# Patient Record
Sex: Female | Born: 1960 | Race: White | Hispanic: No | Marital: Married | Smoking: Current every day smoker
Health system: Southern US, Community
[De-identification: ages and names within clinical notes are randomized; demographics above are authoritative.]

## PROBLEM LIST (undated history)

## (undated) DIAGNOSIS — E119 Type 2 diabetes mellitus without complications: Secondary | ICD-10-CM

## (undated) DIAGNOSIS — J189 Pneumonia, unspecified organism: Secondary | ICD-10-CM

## (undated) DIAGNOSIS — IMO0001 Reserved for inherently not codable concepts without codable children: Secondary | ICD-10-CM

## (undated) DIAGNOSIS — I50810 Right heart failure, unspecified: Secondary | ICD-10-CM

## (undated) DIAGNOSIS — J449 Chronic obstructive pulmonary disease, unspecified: Secondary | ICD-10-CM

## (undated) DIAGNOSIS — R569 Unspecified convulsions: Secondary | ICD-10-CM

## (undated) DIAGNOSIS — R0602 Shortness of breath: Secondary | ICD-10-CM

## (undated) DIAGNOSIS — Z5189 Encounter for other specified aftercare: Secondary | ICD-10-CM

## (undated) DIAGNOSIS — J439 Emphysema, unspecified: Secondary | ICD-10-CM

## (undated) DIAGNOSIS — D649 Anemia, unspecified: Secondary | ICD-10-CM

## (undated) DIAGNOSIS — G8220 Paraplegia, unspecified: Secondary | ICD-10-CM

## (undated) DIAGNOSIS — M797 Fibromyalgia: Secondary | ICD-10-CM

## (undated) DIAGNOSIS — F32A Depression, unspecified: Secondary | ICD-10-CM

## (undated) DIAGNOSIS — G4730 Sleep apnea, unspecified: Secondary | ICD-10-CM

## (undated) DIAGNOSIS — E7800 Pure hypercholesterolemia, unspecified: Secondary | ICD-10-CM

## (undated) DIAGNOSIS — E079 Disorder of thyroid, unspecified: Secondary | ICD-10-CM

## (undated) DIAGNOSIS — F329 Major depressive disorder, single episode, unspecified: Secondary | ICD-10-CM

## (undated) DIAGNOSIS — Z9981 Dependence on supplemental oxygen: Secondary | ICD-10-CM

## (undated) DIAGNOSIS — I509 Heart failure, unspecified: Secondary | ICD-10-CM

## (undated) DIAGNOSIS — F410 Panic disorder [episodic paroxysmal anxiety] without agoraphobia: Secondary | ICD-10-CM

## (undated) DIAGNOSIS — M1990 Unspecified osteoarthritis, unspecified site: Secondary | ICD-10-CM

## (undated) DIAGNOSIS — F419 Anxiety disorder, unspecified: Secondary | ICD-10-CM

## (undated) DIAGNOSIS — I10 Essential (primary) hypertension: Secondary | ICD-10-CM

## (undated) DIAGNOSIS — E039 Hypothyroidism, unspecified: Secondary | ICD-10-CM

## (undated) HISTORY — DX: Reserved for inherently not codable concepts without codable children: IMO0001

## (undated) HISTORY — DX: Anxiety disorder, unspecified: F41.9

## (undated) HISTORY — DX: Pure hypercholesterolemia, unspecified: E78.00

## (undated) HISTORY — PX: CHOLECYSTECTOMY: SHX55

## (undated) HISTORY — PX: ABDOMINAL HYSTERECTOMY: SHX81

## (undated) HISTORY — DX: Disorder of thyroid, unspecified: E07.9

## (undated) HISTORY — DX: Panic disorder (episodic paroxysmal anxiety): F41.0

## (undated) HISTORY — PX: OTHER SURGICAL HISTORY: SHX169

---

## 1998-05-22 ENCOUNTER — Other Ambulatory Visit: Admission: RE | Admit: 1998-05-22 | Discharge: 1998-05-22 | Payer: Self-pay | Admitting: Obstetrics and Gynecology

## 1998-07-31 ENCOUNTER — Ambulatory Visit (HOSPITAL_COMMUNITY): Admission: RE | Admit: 1998-07-31 | Discharge: 1998-07-31 | Payer: Self-pay | Admitting: Obstetrics and Gynecology

## 1998-12-20 ENCOUNTER — Ambulatory Visit (HOSPITAL_COMMUNITY): Admission: RE | Admit: 1998-12-20 | Discharge: 1998-12-20 | Payer: Self-pay | Admitting: *Deleted

## 1999-06-22 ENCOUNTER — Emergency Department (HOSPITAL_COMMUNITY): Admission: EM | Admit: 1999-06-22 | Discharge: 1999-06-22 | Payer: Self-pay | Admitting: Emergency Medicine

## 1999-06-22 ENCOUNTER — Encounter: Payer: Self-pay | Admitting: Emergency Medicine

## 1999-07-11 ENCOUNTER — Emergency Department (HOSPITAL_COMMUNITY): Admission: EM | Admit: 1999-07-11 | Discharge: 1999-07-11 | Payer: Self-pay

## 1999-08-20 ENCOUNTER — Emergency Department (HOSPITAL_COMMUNITY): Admission: EM | Admit: 1999-08-20 | Discharge: 1999-08-20 | Payer: Self-pay | Admitting: Emergency Medicine

## 1999-08-20 ENCOUNTER — Encounter: Payer: Self-pay | Admitting: Emergency Medicine

## 1999-08-22 ENCOUNTER — Encounter: Payer: Self-pay | Admitting: Family Medicine

## 1999-08-22 ENCOUNTER — Inpatient Hospital Stay (HOSPITAL_COMMUNITY): Admission: RE | Admit: 1999-08-22 | Discharge: 1999-08-24 | Payer: Self-pay | Admitting: Family Medicine

## 2000-01-08 ENCOUNTER — Other Ambulatory Visit: Admission: RE | Admit: 2000-01-08 | Discharge: 2000-01-08 | Payer: Self-pay | Admitting: Gynecology

## 2000-02-18 ENCOUNTER — Encounter: Admission: RE | Admit: 2000-02-18 | Discharge: 2000-05-18 | Payer: Self-pay | Admitting: Gynecology

## 2000-03-14 ENCOUNTER — Emergency Department (HOSPITAL_COMMUNITY): Admission: EM | Admit: 2000-03-14 | Discharge: 2000-03-14 | Payer: Self-pay | Admitting: Emergency Medicine

## 2000-03-14 ENCOUNTER — Encounter: Payer: Self-pay | Admitting: Emergency Medicine

## 2000-03-31 ENCOUNTER — Other Ambulatory Visit: Admission: RE | Admit: 2000-03-31 | Discharge: 2000-03-31 | Payer: Self-pay

## 2000-03-31 ENCOUNTER — Encounter: Admission: RE | Admit: 2000-03-31 | Discharge: 2000-03-31 | Payer: Self-pay | Admitting: General Surgery

## 2000-03-31 ENCOUNTER — Encounter: Payer: Self-pay | Admitting: General Surgery

## 2000-12-28 ENCOUNTER — Encounter: Payer: Self-pay | Admitting: *Deleted

## 2000-12-28 ENCOUNTER — Encounter: Admission: RE | Admit: 2000-12-28 | Discharge: 2000-12-28 | Payer: Self-pay | Admitting: *Deleted

## 2001-07-17 ENCOUNTER — Emergency Department (HOSPITAL_COMMUNITY): Admission: EM | Admit: 2001-07-17 | Discharge: 2001-07-17 | Payer: Self-pay | Admitting: Emergency Medicine

## 2001-07-17 ENCOUNTER — Encounter: Payer: Self-pay | Admitting: *Deleted

## 2002-02-14 ENCOUNTER — Emergency Department (HOSPITAL_COMMUNITY): Admission: EM | Admit: 2002-02-14 | Discharge: 2002-02-14 | Payer: Self-pay | Admitting: Emergency Medicine

## 2002-02-14 ENCOUNTER — Encounter: Payer: Self-pay | Admitting: Emergency Medicine

## 2002-06-27 ENCOUNTER — Ambulatory Visit (HOSPITAL_COMMUNITY): Admission: RE | Admit: 2002-06-27 | Discharge: 2002-06-27 | Payer: Self-pay | Admitting: Family Medicine

## 2002-06-27 ENCOUNTER — Encounter: Payer: Self-pay | Admitting: Family Medicine

## 2002-12-16 ENCOUNTER — Encounter: Payer: Self-pay | Admitting: Family Medicine

## 2002-12-16 ENCOUNTER — Ambulatory Visit (HOSPITAL_COMMUNITY): Admission: RE | Admit: 2002-12-16 | Discharge: 2002-12-16 | Payer: Self-pay | Admitting: Family Medicine

## 2003-02-13 ENCOUNTER — Ambulatory Visit (HOSPITAL_BASED_OUTPATIENT_CLINIC_OR_DEPARTMENT_OTHER): Admission: RE | Admit: 2003-02-13 | Discharge: 2003-02-13 | Payer: Self-pay | Admitting: Family Medicine

## 2003-03-13 ENCOUNTER — Encounter: Payer: Self-pay | Admitting: Family Medicine

## 2003-03-13 ENCOUNTER — Ambulatory Visit (HOSPITAL_COMMUNITY): Admission: RE | Admit: 2003-03-13 | Discharge: 2003-03-13 | Payer: Self-pay | Admitting: Family Medicine

## 2003-04-07 ENCOUNTER — Ambulatory Visit (HOSPITAL_COMMUNITY): Admission: RE | Admit: 2003-04-07 | Discharge: 2003-04-07 | Payer: Self-pay | Admitting: Family Medicine

## 2003-04-07 ENCOUNTER — Encounter: Payer: Self-pay | Admitting: Family Medicine

## 2003-04-18 ENCOUNTER — Encounter: Payer: Self-pay | Admitting: Family Medicine

## 2003-04-18 ENCOUNTER — Encounter: Admission: RE | Admit: 2003-04-18 | Discharge: 2003-04-18 | Payer: Self-pay | Admitting: Family Medicine

## 2003-04-21 ENCOUNTER — Ambulatory Visit (HOSPITAL_COMMUNITY): Admission: RE | Admit: 2003-04-21 | Discharge: 2003-04-21 | Payer: Self-pay | Admitting: Cardiology

## 2003-04-25 ENCOUNTER — Encounter: Admission: RE | Admit: 2003-04-25 | Discharge: 2003-04-25 | Payer: Self-pay | Admitting: Family Medicine

## 2003-04-25 ENCOUNTER — Encounter: Payer: Self-pay | Admitting: Family Medicine

## 2003-08-29 ENCOUNTER — Emergency Department (HOSPITAL_COMMUNITY): Admission: EM | Admit: 2003-08-29 | Discharge: 2003-08-30 | Payer: Self-pay | Admitting: Emergency Medicine

## 2004-11-29 ENCOUNTER — Emergency Department (HOSPITAL_COMMUNITY): Admission: EM | Admit: 2004-11-29 | Discharge: 2004-11-30 | Payer: Self-pay | Admitting: Emergency Medicine

## 2005-02-28 ENCOUNTER — Ambulatory Visit: Payer: Self-pay | Admitting: Internal Medicine

## 2005-02-28 ENCOUNTER — Ambulatory Visit (HOSPITAL_COMMUNITY): Admission: RE | Admit: 2005-02-28 | Discharge: 2005-02-28 | Payer: Self-pay | Admitting: Internal Medicine

## 2005-03-06 ENCOUNTER — Ambulatory Visit (HOSPITAL_COMMUNITY): Admission: RE | Admit: 2005-03-06 | Discharge: 2005-03-06 | Payer: Self-pay | Admitting: *Deleted

## 2005-03-14 ENCOUNTER — Encounter (INDEPENDENT_AMBULATORY_CARE_PROVIDER_SITE_OTHER): Payer: Self-pay | Admitting: *Deleted

## 2005-03-14 ENCOUNTER — Ambulatory Visit: Payer: Self-pay | Admitting: Internal Medicine

## 2005-03-17 ENCOUNTER — Ambulatory Visit (HOSPITAL_COMMUNITY): Admission: RE | Admit: 2005-03-17 | Discharge: 2005-03-17 | Payer: Self-pay | Admitting: Internal Medicine

## 2005-03-21 ENCOUNTER — Ambulatory Visit: Payer: Self-pay | Admitting: Internal Medicine

## 2005-03-28 ENCOUNTER — Ambulatory Visit (HOSPITAL_COMMUNITY): Admission: RE | Admit: 2005-03-28 | Discharge: 2005-03-28 | Payer: Self-pay | Admitting: Internal Medicine

## 2005-03-28 ENCOUNTER — Ambulatory Visit: Payer: Self-pay | Admitting: Internal Medicine

## 2005-04-10 ENCOUNTER — Ambulatory Visit: Payer: Self-pay | Admitting: Internal Medicine

## 2005-04-23 ENCOUNTER — Encounter: Admission: RE | Admit: 2005-04-23 | Discharge: 2005-05-10 | Payer: Self-pay | Admitting: *Deleted

## 2005-04-25 ENCOUNTER — Ambulatory Visit: Payer: Self-pay | Admitting: Sports Medicine

## 2005-04-25 ENCOUNTER — Ambulatory Visit (HOSPITAL_COMMUNITY): Admission: RE | Admit: 2005-04-25 | Discharge: 2005-04-25 | Payer: Self-pay | Admitting: Family Medicine

## 2005-06-13 ENCOUNTER — Ambulatory Visit: Payer: Self-pay | Admitting: Sports Medicine

## 2005-06-20 ENCOUNTER — Ambulatory Visit: Payer: Self-pay | Admitting: Hospitalist

## 2005-06-30 ENCOUNTER — Encounter: Admission: RE | Admit: 2005-06-30 | Discharge: 2005-07-30 | Payer: Self-pay | Admitting: Sports Medicine

## 2005-07-01 ENCOUNTER — Ambulatory Visit: Payer: Self-pay | Admitting: Internal Medicine

## 2005-07-10 ENCOUNTER — Ambulatory Visit (HOSPITAL_COMMUNITY): Admission: RE | Admit: 2005-07-10 | Discharge: 2005-07-10 | Payer: Self-pay | Admitting: Internal Medicine

## 2005-07-21 ENCOUNTER — Encounter: Admission: RE | Admit: 2005-07-21 | Discharge: 2005-07-21 | Payer: Self-pay | Admitting: Internal Medicine

## 2005-07-21 ENCOUNTER — Encounter (INDEPENDENT_AMBULATORY_CARE_PROVIDER_SITE_OTHER): Payer: Self-pay | Admitting: *Deleted

## 2005-09-09 ENCOUNTER — Ambulatory Visit: Payer: Self-pay | Admitting: Hospitalist

## 2006-06-10 DIAGNOSIS — G4730 Sleep apnea, unspecified: Secondary | ICD-10-CM | POA: Insufficient documentation

## 2006-06-10 DIAGNOSIS — K219 Gastro-esophageal reflux disease without esophagitis: Secondary | ICD-10-CM

## 2006-06-10 DIAGNOSIS — R945 Abnormal results of liver function studies: Secondary | ICD-10-CM | POA: Insufficient documentation

## 2006-06-10 DIAGNOSIS — R109 Unspecified abdominal pain: Secondary | ICD-10-CM | POA: Insufficient documentation

## 2006-06-10 DIAGNOSIS — J984 Other disorders of lung: Secondary | ICD-10-CM

## 2006-06-10 DIAGNOSIS — J4489 Other specified chronic obstructive pulmonary disease: Secondary | ICD-10-CM | POA: Insufficient documentation

## 2006-06-10 DIAGNOSIS — Z9079 Acquired absence of other genital organ(s): Secondary | ICD-10-CM | POA: Insufficient documentation

## 2006-06-10 DIAGNOSIS — M545 Low back pain: Secondary | ICD-10-CM

## 2006-06-10 DIAGNOSIS — IMO0001 Reserved for inherently not codable concepts without codable children: Secondary | ICD-10-CM

## 2006-06-10 DIAGNOSIS — J449 Chronic obstructive pulmonary disease, unspecified: Secondary | ICD-10-CM

## 2006-06-10 DIAGNOSIS — Z8541 Personal history of malignant neoplasm of cervix uteri: Secondary | ICD-10-CM

## 2006-06-10 DIAGNOSIS — F17200 Nicotine dependence, unspecified, uncomplicated: Secondary | ICD-10-CM | POA: Insufficient documentation

## 2006-06-10 DIAGNOSIS — F411 Generalized anxiety disorder: Secondary | ICD-10-CM | POA: Insufficient documentation

## 2006-09-16 DIAGNOSIS — E781 Pure hyperglyceridemia: Secondary | ICD-10-CM | POA: Insufficient documentation

## 2006-12-04 ENCOUNTER — Emergency Department (HOSPITAL_COMMUNITY): Admission: EM | Admit: 2006-12-04 | Discharge: 2006-12-04 | Payer: Self-pay | Admitting: Family Medicine

## 2006-12-30 ENCOUNTER — Emergency Department (HOSPITAL_COMMUNITY): Admission: EM | Admit: 2006-12-30 | Discharge: 2006-12-30 | Payer: Self-pay | Admitting: Emergency Medicine

## 2009-06-22 ENCOUNTER — Ambulatory Visit: Payer: Self-pay | Admitting: Infectious Diseases

## 2009-06-22 ENCOUNTER — Inpatient Hospital Stay (HOSPITAL_COMMUNITY): Admission: EM | Admit: 2009-06-22 | Discharge: 2009-06-24 | Payer: Self-pay | Admitting: Emergency Medicine

## 2010-08-20 ENCOUNTER — Inpatient Hospital Stay (HOSPITAL_COMMUNITY)
Admission: EM | Admit: 2010-08-20 | Discharge: 2010-09-07 | Payer: Self-pay | Source: Home / Self Care | Attending: Internal Medicine | Admitting: Internal Medicine

## 2010-08-21 ENCOUNTER — Encounter: Payer: Self-pay | Admitting: Pulmonary Disease

## 2010-08-28 ENCOUNTER — Other Ambulatory Visit: Payer: Self-pay | Admitting: Internal Medicine

## 2010-08-28 DIAGNOSIS — F329 Major depressive disorder, single episode, unspecified: Secondary | ICD-10-CM

## 2010-08-28 LAB — CBC
HCT: 31.7 % — ABNORMAL LOW (ref 36.0–46.0)
Hemoglobin: 10.6 g/dL — ABNORMAL LOW (ref 12.0–15.0)
MCH: 31.6 pg (ref 26.0–34.0)
MCHC: 33.4 g/dL (ref 30.0–36.0)
MCV: 94.6 fL (ref 78.0–100.0)
Platelets: 130 10*3/uL — ABNORMAL LOW (ref 150–400)
RBC: 3.35 MIL/uL — ABNORMAL LOW (ref 3.87–5.11)
RDW: 15.1 % (ref 11.5–15.5)
WBC: 11.1 10*3/uL — ABNORMAL HIGH (ref 4.0–10.5)

## 2010-08-28 LAB — RENAL FUNCTION PANEL
Albumin: 2.7 g/dL — ABNORMAL LOW (ref 3.5–5.2)
BUN: 65 mg/dL — ABNORMAL HIGH (ref 6–23)
CO2: 22 mEq/L (ref 19–32)
Calcium: 9.4 mg/dL (ref 8.4–10.5)
Chloride: 98 mEq/L (ref 96–112)
Creatinine, Ser: 7 mg/dL — ABNORMAL HIGH (ref 0.4–1.2)
GFR calc Af Amer: 8 mL/min — ABNORMAL LOW (ref >60)
GFR calc non Af Amer: 6 mL/min — ABNORMAL LOW (ref >60)
Glucose, Bld: 82 mg/dL (ref 70–99)
Phosphorus: 6.8 mg/dL — ABNORMAL HIGH (ref 2.3–4.6)
Potassium: 3.5 mEq/L (ref 3.5–5.1)
Sodium: 144 mEq/L (ref 135–145)

## 2010-08-28 LAB — GLUCOSE, CAPILLARY
Glucose-Capillary: 83 mg/dL (ref 70–99)
Glucose-Capillary: 88 mg/dL (ref 70–99)
Glucose-Capillary: 79 mg/dL (ref 70–99)
Glucose-Capillary: 86 mg/dL (ref 70–99)

## 2010-08-28 LAB — COMPREHENSIVE METABOLIC PANEL
ALT: 135 U/L — ABNORMAL HIGH (ref 0–35)
AST: 32 U/L (ref 0–37)
Albumin: 2.5 g/dL — ABNORMAL LOW (ref 3.5–5.2)
Alkaline Phosphatase: 40 U/L (ref 39–117)
BUN: 62 mg/dL — ABNORMAL HIGH (ref 6–23)
CO2: 24 mEq/L (ref 19–32)
Calcium: 9.3 mg/dL (ref 8.4–10.5)
Chloride: 98 mEq/L (ref 96–112)
Creatinine, Ser: 6.91 mg/dL — ABNORMAL HIGH (ref 0.4–1.2)
GFR calc Af Amer: 8 mL/min — ABNORMAL LOW (ref >60)
GFR calc non Af Amer: 6 mL/min — ABNORMAL LOW (ref >60)
Glucose, Bld: 85 mg/dL (ref 70–99)
Potassium: 3.3 mEq/L — ABNORMAL LOW (ref 3.5–5.1)
Sodium: 146 mEq/L — ABNORMAL HIGH (ref 135–145)
Total Bilirubin: 0.9 mg/dL (ref 0.3–1.2)
Total Protein: 5.3 g/dL — ABNORMAL LOW (ref 6.0–8.3)

## 2010-08-28 LAB — MRSA PCR SCREENING: MRSA by PCR: NEGATIVE

## 2010-08-29 DIAGNOSIS — F329 Major depressive disorder, single episode, unspecified: Secondary | ICD-10-CM

## 2010-08-29 LAB — COMPREHENSIVE METABOLIC PANEL
ALT: 102 U/L — ABNORMAL HIGH (ref 0–35)
AST: 30 U/L (ref 0–37)
Albumin: 2.8 g/dL — ABNORMAL LOW (ref 3.5–5.2)
Alkaline Phosphatase: 45 U/L (ref 39–117)
BUN: 68 mg/dL — ABNORMAL HIGH (ref 6–23)
CO2: 25 mEq/L (ref 19–32)
Calcium: 9.6 mg/dL (ref 8.4–10.5)
Chloride: 99 mEq/L (ref 96–112)
Creatinine, Ser: 7.2 mg/dL — ABNORMAL HIGH (ref 0.4–1.2)
GFR calc Af Amer: 7 mL/min — ABNORMAL LOW (ref >60)
GFR calc non Af Amer: 6 mL/min — ABNORMAL LOW (ref >60)
Glucose, Bld: 72 mg/dL (ref 70–99)
Potassium: 3.3 mEq/L — ABNORMAL LOW (ref 3.5–5.1)
Sodium: 147 mEq/L — ABNORMAL HIGH (ref 135–145)
Total Bilirubin: 1.1 mg/dL (ref 0.3–1.2)
Total Protein: 5.7 g/dL — ABNORMAL LOW (ref 6.0–8.3)

## 2010-08-29 LAB — CBC
HCT: 31.5 % — ABNORMAL LOW (ref 36.0–46.0)
Hemoglobin: 10.6 g/dL — ABNORMAL LOW (ref 12.0–15.0)
MCH: 32.2 pg (ref 26.0–34.0)
MCHC: 33.7 g/dL (ref 30.0–36.0)
MCV: 95.7 fL (ref 78.0–100.0)
Platelets: 132 10*3/uL — ABNORMAL LOW (ref 150–400)
RBC: 3.29 MIL/uL — ABNORMAL LOW (ref 3.87–5.11)
RDW: 15.1 % (ref 11.5–15.5)
WBC: 11.3 10*3/uL — ABNORMAL HIGH (ref 4.0–10.5)

## 2010-08-29 LAB — GLUCOSE, CAPILLARY
Glucose-Capillary: 68 mg/dL — ABNORMAL LOW (ref 70–99)
Glucose-Capillary: 116 mg/dL — ABNORMAL HIGH (ref 70–99)
Glucose-Capillary: 81 mg/dL (ref 70–99)

## 2010-08-29 LAB — PHOSPHORUS: Phosphorus: 7.4 mg/dL — ABNORMAL HIGH (ref 2.3–4.6)

## 2010-08-30 DIAGNOSIS — F329 Major depressive disorder, single episode, unspecified: Secondary | ICD-10-CM

## 2010-08-30 LAB — COMPREHENSIVE METABOLIC PANEL
ALT: 82 U/L — ABNORMAL HIGH (ref 0–35)
AST: 34 U/L (ref 0–37)
Albumin: 3.1 g/dL — ABNORMAL LOW (ref 3.5–5.2)
Alkaline Phosphatase: 41 U/L (ref 39–117)
BUN: 70 mg/dL — ABNORMAL HIGH (ref 6–23)
CO2: 22 mEq/L (ref 19–32)
Calcium: 9.6 mg/dL (ref 8.4–10.5)
Chloride: 98 mEq/L (ref 96–112)
Creatinine, Ser: 7.15 mg/dL — ABNORMAL HIGH (ref 0.4–1.2)
GFR calc Af Amer: 7 mL/min — ABNORMAL LOW (ref >60)
GFR calc non Af Amer: 6 mL/min — ABNORMAL LOW (ref >60)
Glucose, Bld: 81 mg/dL (ref 70–99)
Potassium: 3.5 mEq/L (ref 3.5–5.1)
Sodium: 144 mEq/L (ref 135–145)
Total Bilirubin: 1.1 mg/dL (ref 0.3–1.2)
Total Protein: 6.3 g/dL (ref 6.0–8.3)

## 2010-08-30 LAB — BRAIN NATRIURETIC PEPTIDE: Pro B Natriuretic peptide (BNP): 273 pg/mL — ABNORMAL HIGH (ref 0.0–100.0)

## 2010-08-30 LAB — GLUCOSE, CAPILLARY
Glucose-Capillary: 87 mg/dL (ref 70–99)
Glucose-Capillary: 79 mg/dL (ref 70–99)
Glucose-Capillary: 84 mg/dL (ref 70–99)

## 2010-08-30 LAB — CBC
HCT: 31.6 % — ABNORMAL LOW (ref 36.0–46.0)
Hemoglobin: 10.4 g/dL — ABNORMAL LOW (ref 12.0–15.0)
MCH: 31.7 pg (ref 26.0–34.0)
MCHC: 32.9 g/dL (ref 30.0–36.0)
MCV: 96.3 fL (ref 78.0–100.0)
Platelets: 141 10*3/uL — ABNORMAL LOW (ref 150–400)
RBC: 3.28 MIL/uL — ABNORMAL LOW (ref 3.87–5.11)
RDW: 14.8 % (ref 11.5–15.5)
WBC: 10.5 10*3/uL (ref 4.0–10.5)

## 2010-09-09 LAB — GLUCOSE, CAPILLARY
Glucose-Capillary: 79 mg/dL (ref 70–99)
Glucose-Capillary: 145 mg/dL — ABNORMAL HIGH (ref 70–99)
Glucose-Capillary: 153 mg/dL — ABNORMAL HIGH (ref 70–99)
Glucose-Capillary: 181 mg/dL — ABNORMAL HIGH (ref 70–99)
Glucose-Capillary: 139 mg/dL — ABNORMAL HIGH (ref 70–99)
Glucose-Capillary: 147 mg/dL — ABNORMAL HIGH (ref 70–99)
Glucose-Capillary: 170 mg/dL — ABNORMAL HIGH (ref 70–99)
Glucose-Capillary: 172 mg/dL — ABNORMAL HIGH (ref 70–99)
Glucose-Capillary: 111 mg/dL — ABNORMAL HIGH (ref 70–99)
Glucose-Capillary: 112 mg/dL — ABNORMAL HIGH (ref 70–99)
Glucose-Capillary: 125 mg/dL — ABNORMAL HIGH (ref 70–99)
Glucose-Capillary: 133 mg/dL — ABNORMAL HIGH (ref 70–99)
Glucose-Capillary: 133 mg/dL — ABNORMAL HIGH (ref 70–99)
Glucose-Capillary: 133 mg/dL — ABNORMAL HIGH (ref 70–99)
Glucose-Capillary: 143 mg/dL — ABNORMAL HIGH (ref 70–99)
Glucose-Capillary: 146 mg/dL — ABNORMAL HIGH (ref 70–99)
Glucose-Capillary: 147 mg/dL — ABNORMAL HIGH (ref 70–99)
Glucose-Capillary: 150 mg/dL — ABNORMAL HIGH (ref 70–99)
Glucose-Capillary: 155 mg/dL — ABNORMAL HIGH (ref 70–99)
Glucose-Capillary: 160 mg/dL — ABNORMAL HIGH (ref 70–99)
Glucose-Capillary: 160 mg/dL — ABNORMAL HIGH (ref 70–99)
Glucose-Capillary: 104 mg/dL — ABNORMAL HIGH (ref 70–99)
Glucose-Capillary: 106 mg/dL — ABNORMAL HIGH (ref 70–99)
Glucose-Capillary: 111 mg/dL — ABNORMAL HIGH (ref 70–99)
Glucose-Capillary: 112 mg/dL — ABNORMAL HIGH (ref 70–99)
Glucose-Capillary: 114 mg/dL — ABNORMAL HIGH (ref 70–99)
Glucose-Capillary: 144 mg/dL — ABNORMAL HIGH (ref 70–99)
Glucose-Capillary: 149 mg/dL — ABNORMAL HIGH (ref 70–99)
Glucose-Capillary: 103 mg/dL — ABNORMAL HIGH (ref 70–99)
Glucose-Capillary: 109 mg/dL — ABNORMAL HIGH (ref 70–99)
Glucose-Capillary: 110 mg/dL — ABNORMAL HIGH (ref 70–99)
Glucose-Capillary: 111 mg/dL — ABNORMAL HIGH (ref 70–99)
Glucose-Capillary: 113 mg/dL — ABNORMAL HIGH (ref 70–99)
Glucose-Capillary: 135 mg/dL — ABNORMAL HIGH (ref 70–99)
Glucose-Capillary: 91 mg/dL (ref 70–99)
Glucose-Capillary: 93 mg/dL (ref 70–99)

## 2010-09-09 LAB — COMPREHENSIVE METABOLIC PANEL
ALT: 70 U/L — ABNORMAL HIGH (ref 0–35)
ALT: 52 U/L — ABNORMAL HIGH (ref 0–35)
ALT: 37 U/L — ABNORMAL HIGH (ref 0–35)
ALT: 37 U/L — ABNORMAL HIGH (ref 0–35)
ALT: 47 U/L — ABNORMAL HIGH (ref 0–35)
ALT: 53 U/L — ABNORMAL HIGH (ref 0–35)
ALT: 47 U/L — ABNORMAL HIGH (ref 0–35)
ALT: 46 U/L — ABNORMAL HIGH (ref 0–35)
AST: 39 U/L — ABNORMAL HIGH (ref 0–37)
AST: 27 U/L (ref 0–37)
AST: 30 U/L (ref 0–37)
AST: 31 U/L (ref 0–37)
AST: 44 U/L — ABNORMAL HIGH (ref 0–37)
AST: 64 U/L — ABNORMAL HIGH (ref 0–37)
AST: 29 U/L (ref 0–37)
AST: 34 U/L (ref 0–37)
Albumin: 3 g/dL — ABNORMAL LOW (ref 3.5–5.2)
Albumin: 2.8 g/dL — ABNORMAL LOW (ref 3.5–5.2)
Albumin: 2.6 g/dL — ABNORMAL LOW (ref 3.5–5.2)
Albumin: 2.8 g/dL — ABNORMAL LOW (ref 3.5–5.2)
Albumin: 3 g/dL — ABNORMAL LOW (ref 3.5–5.2)
Albumin: 2.9 g/dL — ABNORMAL LOW (ref 3.5–5.2)
Albumin: 3.2 g/dL — ABNORMAL LOW (ref 3.5–5.2)
Albumin: 3.2 g/dL — ABNORMAL LOW (ref 3.5–5.2)
Alkaline Phosphatase: 51 U/L (ref 39–117)
Alkaline Phosphatase: 54 U/L (ref 39–117)
Alkaline Phosphatase: 53 U/L (ref 39–117)
Alkaline Phosphatase: 53 U/L (ref 39–117)
Alkaline Phosphatase: 48 U/L (ref 39–117)
Alkaline Phosphatase: 51 U/L (ref 39–117)
Alkaline Phosphatase: 55 U/L (ref 39–117)
Alkaline Phosphatase: 61 U/L (ref 39–117)
BUN: 62 mg/dL — ABNORMAL HIGH (ref 6–23)
BUN: 58 mg/dL — ABNORMAL HIGH (ref 6–23)
BUN: 52 mg/dL — ABNORMAL HIGH (ref 6–23)
BUN: 54 mg/dL — ABNORMAL HIGH (ref 6–23)
BUN: 54 mg/dL — ABNORMAL HIGH (ref 6–23)
BUN: 46 mg/dL — ABNORMAL HIGH (ref 6–23)
BUN: 33 mg/dL — ABNORMAL HIGH (ref 6–23)
BUN: 26 mg/dL — ABNORMAL HIGH (ref 6–23)
CO2: 27 mEq/L (ref 19–32)
CO2: 35 mEq/L — ABNORMAL HIGH (ref 19–32)
CO2: 36 mEq/L — ABNORMAL HIGH (ref 19–32)
CO2: 33 mEq/L — ABNORMAL HIGH (ref 19–32)
CO2: 33 mEq/L — ABNORMAL HIGH (ref 19–32)
CO2: 30 mEq/L (ref 19–32)
CO2: 30 mEq/L (ref 19–32)
CO2: 27 mEq/L (ref 19–32)
Calcium: 9.9 mg/dL (ref 8.4–10.5)
Calcium: 9.9 mg/dL (ref 8.4–10.5)
Calcium: 10.1 mg/dL (ref 8.4–10.5)
Calcium: 10.1 mg/dL (ref 8.4–10.5)
Calcium: 9.9 mg/dL (ref 8.4–10.5)
Calcium: 8.9 mg/dL (ref 8.4–10.5)
Calcium: 9.4 mg/dL (ref 8.4–10.5)
Calcium: 9.4 mg/dL (ref 8.4–10.5)
Chloride: 98 mEq/L (ref 96–112)
Chloride: 102 mEq/L (ref 96–112)
Chloride: 103 mEq/L (ref 96–112)
Chloride: 100 mEq/L (ref 96–112)
Chloride: 94 mEq/L — ABNORMAL LOW (ref 96–112)
Chloride: 97 mEq/L (ref 96–112)
Chloride: 103 mEq/L (ref 96–112)
Chloride: 107 mEq/L (ref 96–112)
Creatinine, Ser: 6.03 mg/dL — ABNORMAL HIGH (ref 0.4–1.2)
Creatinine, Ser: 4.62 mg/dL — ABNORMAL HIGH (ref 0.4–1.2)
Creatinine, Ser: 3.66 mg/dL — ABNORMAL HIGH (ref 0.4–1.2)
Creatinine, Ser: 3.39 mg/dL — ABNORMAL HIGH (ref 0.4–1.2)
Creatinine, Ser: 3.37 mg/dL — ABNORMAL HIGH (ref 0.4–1.2)
Creatinine, Ser: 3.34 mg/dL — ABNORMAL HIGH (ref 0.4–1.2)
Creatinine, Ser: 2.83 mg/dL — ABNORMAL HIGH (ref 0.4–1.2)
Creatinine, Ser: 2.4 mg/dL — ABNORMAL HIGH (ref 0.4–1.2)
GFR calc Af Amer: 9 mL/min — ABNORMAL LOW (ref >60)
GFR calc Af Amer: 12 mL/min — ABNORMAL LOW (ref >60)
GFR calc Af Amer: 16 mL/min — ABNORMAL LOW (ref >60)
GFR calc Af Amer: 18 mL/min — ABNORMAL LOW (ref >60)
GFR calc Af Amer: 18 mL/min — ABNORMAL LOW (ref >60)
GFR calc Af Amer: 18 mL/min — ABNORMAL LOW (ref >60)
GFR calc Af Amer: 22 mL/min — ABNORMAL LOW (ref >60)
GFR calc Af Amer: 26 mL/min — ABNORMAL LOW (ref >60)
GFR calc non Af Amer: 7 mL/min — ABNORMAL LOW (ref >60)
GFR calc non Af Amer: 10 mL/min — ABNORMAL LOW (ref >60)
GFR calc non Af Amer: 13 mL/min — ABNORMAL LOW (ref >60)
GFR calc non Af Amer: 15 mL/min — ABNORMAL LOW (ref >60)
GFR calc non Af Amer: 15 mL/min — ABNORMAL LOW (ref >60)
GFR calc non Af Amer: 15 mL/min — ABNORMAL LOW (ref >60)
GFR calc non Af Amer: 18 mL/min — ABNORMAL LOW (ref >60)
GFR calc non Af Amer: 22 mL/min — ABNORMAL LOW (ref >60)
Glucose, Bld: 126 mg/dL — ABNORMAL HIGH (ref 70–99)
Glucose, Bld: 152 mg/dL — ABNORMAL HIGH (ref 70–99)
Glucose, Bld: 143 mg/dL — ABNORMAL HIGH (ref 70–99)
Glucose, Bld: 131 mg/dL — ABNORMAL HIGH (ref 70–99)
Glucose, Bld: 107 mg/dL — ABNORMAL HIGH (ref 70–99)
Glucose, Bld: 95 mg/dL (ref 70–99)
Glucose, Bld: 93 mg/dL (ref 70–99)
Glucose, Bld: 81 mg/dL (ref 70–99)
Potassium: 3 mEq/L — ABNORMAL LOW (ref 3.5–5.1)
Potassium: 2.6 mEq/L — CL (ref 3.5–5.1)
Potassium: 2.9 mEq/L — ABNORMAL LOW (ref 3.5–5.1)
Potassium: 3.9 mEq/L (ref 3.5–5.1)
Potassium: 3.3 mEq/L — ABNORMAL LOW (ref 3.5–5.1)
Potassium: 4.1 mEq/L (ref 3.5–5.1)
Potassium: 3.3 mEq/L — ABNORMAL LOW (ref 3.5–5.1)
Potassium: 3.9 mEq/L (ref 3.5–5.1)
Sodium: 146 mEq/L — ABNORMAL HIGH (ref 135–145)
Sodium: 151 mEq/L — ABNORMAL HIGH (ref 135–145)
Sodium: 150 mEq/L — ABNORMAL HIGH (ref 135–145)
Sodium: 146 mEq/L — ABNORMAL HIGH (ref 135–145)
Sodium: 143 mEq/L (ref 135–145)
Sodium: 139 mEq/L (ref 135–145)
Sodium: 145 mEq/L (ref 135–145)
Sodium: 147 mEq/L — ABNORMAL HIGH (ref 135–145)
Total Bilirubin: 1 mg/dL (ref 0.3–1.2)
Total Bilirubin: 0.7 mg/dL (ref 0.3–1.2)
Total Bilirubin: 0.5 mg/dL (ref 0.3–1.2)
Total Bilirubin: 0.6 mg/dL (ref 0.3–1.2)
Total Bilirubin: 1 mg/dL (ref 0.3–1.2)
Total Bilirubin: 2.1 mg/dL — ABNORMAL HIGH (ref 0.3–1.2)
Total Bilirubin: 1 mg/dL (ref 0.3–1.2)
Total Bilirubin: 0.8 mg/dL (ref 0.3–1.2)
Total Protein: 6.6 g/dL (ref 6.0–8.3)
Total Protein: 6.5 g/dL (ref 6.0–8.3)
Total Protein: 6.3 g/dL (ref 6.0–8.3)
Total Protein: 6.7 g/dL (ref 6.0–8.3)
Total Protein: 7 g/dL (ref 6.0–8.3)
Total Protein: 6.5 g/dL (ref 6.0–8.3)
Total Protein: 6.9 g/dL (ref 6.0–8.3)
Total Protein: 6.5 g/dL (ref 6.0–8.3)

## 2010-09-09 LAB — CBC
HCT: 33.5 % — ABNORMAL LOW (ref 36.0–46.0)
HCT: 32.4 % — ABNORMAL LOW (ref 36.0–46.0)
HCT: 32.4 % — ABNORMAL LOW (ref 36.0–46.0)
HCT: 33 % — ABNORMAL LOW (ref 36.0–46.0)
HCT: 32.1 % — ABNORMAL LOW (ref 36.0–46.0)
HCT: 31 % — ABNORMAL LOW (ref 36.0–46.0)
HCT: 32.9 % — ABNORMAL LOW (ref 36.0–46.0)
HCT: 33.8 % — ABNORMAL LOW (ref 36.0–46.0)
Hemoglobin: 11.2 g/dL — ABNORMAL LOW (ref 12.0–15.0)
Hemoglobin: 10.4 g/dL — ABNORMAL LOW (ref 12.0–15.0)
Hemoglobin: 10.1 g/dL — ABNORMAL LOW (ref 12.0–15.0)
Hemoglobin: 10.3 g/dL — ABNORMAL LOW (ref 12.0–15.0)
Hemoglobin: 10.2 g/dL — ABNORMAL LOW (ref 12.0–15.0)
Hemoglobin: 10 g/dL — ABNORMAL LOW (ref 12.0–15.0)
Hemoglobin: 10.5 g/dL — ABNORMAL LOW (ref 12.0–15.0)
Hemoglobin: 10.7 g/dL — ABNORMAL LOW (ref 12.0–15.0)
MCH: 32.4 pg (ref 26.0–34.0)
MCH: 31.7 pg (ref 26.0–34.0)
MCH: 32 pg (ref 26.0–34.0)
MCH: 32.3 pg (ref 26.0–34.0)
MCH: 32.2 pg (ref 26.0–34.0)
MCH: 32.1 pg (ref 26.0–34.0)
MCH: 31.9 pg (ref 26.0–34.0)
MCH: 32 pg (ref 26.0–34.0)
MCHC: 33.4 g/dL (ref 30.0–36.0)
MCHC: 32.1 g/dL (ref 30.0–36.0)
MCHC: 31.2 g/dL (ref 30.0–36.0)
MCHC: 31.2 g/dL (ref 30.0–36.0)
MCHC: 31.8 g/dL (ref 30.0–36.0)
MCHC: 32.3 g/dL (ref 30.0–36.0)
MCHC: 31.9 g/dL (ref 30.0–36.0)
MCHC: 31.7 g/dL (ref 30.0–36.0)
MCV: 96.8 fL (ref 78.0–100.0)
MCV: 98.8 fL (ref 78.0–100.0)
MCV: 102.5 fL — ABNORMAL HIGH (ref 78.0–100.0)
MCV: 103.4 fL — ABNORMAL HIGH (ref 78.0–100.0)
MCV: 101.3 fL — ABNORMAL HIGH (ref 78.0–100.0)
MCV: 99.4 fL (ref 78.0–100.0)
MCV: 100 fL (ref 78.0–100.0)
MCV: 101.2 fL — ABNORMAL HIGH (ref 78.0–100.0)
Platelets: 176 10*3/uL (ref 150–400)
Platelets: 194 10*3/uL (ref 150–400)
Platelets: 223 10*3/uL (ref 150–400)
Platelets: 232 10*3/uL (ref 150–400)
Platelets: 233 10*3/uL (ref 150–400)
Platelets: 243 10*3/uL (ref 150–400)
Platelets: 274 10*3/uL (ref 150–400)
Platelets: 284 10*3/uL (ref 150–400)
RBC: 3.46 MIL/uL — ABNORMAL LOW (ref 3.87–5.11)
RBC: 3.28 MIL/uL — ABNORMAL LOW (ref 3.87–5.11)
RBC: 3.16 MIL/uL — ABNORMAL LOW (ref 3.87–5.11)
RBC: 3.19 MIL/uL — ABNORMAL LOW (ref 3.87–5.11)
RBC: 3.17 MIL/uL — ABNORMAL LOW (ref 3.87–5.11)
RBC: 3.12 MIL/uL — ABNORMAL LOW (ref 3.87–5.11)
RBC: 3.29 MIL/uL — ABNORMAL LOW (ref 3.87–5.11)
RBC: 3.34 MIL/uL — ABNORMAL LOW (ref 3.87–5.11)
RDW: 15 % (ref 11.5–15.5)
RDW: 14.9 % (ref 11.5–15.5)
RDW: 15 % (ref 11.5–15.5)
RDW: 15.2 % (ref 11.5–15.5)
RDW: 14.6 % (ref 11.5–15.5)
RDW: 14.5 % (ref 11.5–15.5)
RDW: 14.2 % (ref 11.5–15.5)
RDW: 14.6 % (ref 11.5–15.5)
WBC: 9.9 10*3/uL (ref 4.0–10.5)
WBC: 8.5 10*3/uL (ref 4.0–10.5)
WBC: 9.4 10*3/uL (ref 4.0–10.5)
WBC: 9.9 10*3/uL (ref 4.0–10.5)
WBC: 10.4 10*3/uL (ref 4.0–10.5)
WBC: 6.5 10*3/uL (ref 4.0–10.5)
WBC: 6.1 10*3/uL (ref 4.0–10.5)
WBC: 6.4 10*3/uL (ref 4.0–10.5)

## 2010-09-09 LAB — MAGNESIUM
Magnesium: 2 mg/dL (ref 1.5–2.5)
Magnesium: 2.2 mg/dL (ref 1.5–2.5)

## 2010-09-09 LAB — BLOOD GAS, ARTERIAL
Acid-Base Excess: 8.7 mmol/L — ABNORMAL HIGH (ref 0.0–2.0)
Bicarbonate: 32.5 mEq/L — ABNORMAL HIGH (ref 20.0–24.0)
Drawn by: 32131
FIO2: 0.5 %
O2 Saturation: 93.3 %
Patient temperature: 98.6
TCO2: 33.7 mmol/L (ref 0–100)
pCO2 arterial: 42.1 mmHg (ref 35.0–45.0)
pH, Arterial: 7.499 — ABNORMAL HIGH (ref 7.350–7.400)
pO2, Arterial: 67.9 mmHg — ABNORMAL LOW (ref 80.0–100.0)

## 2010-09-09 LAB — BASIC METABOLIC PANEL
BUN: 59 mg/dL — ABNORMAL HIGH (ref 6–23)
BUN: 51 mg/dL — ABNORMAL HIGH (ref 6–23)
CO2: 35 mEq/L — ABNORMAL HIGH (ref 19–32)
CO2: 32 mEq/L (ref 19–32)
Calcium: 10.1 mg/dL (ref 8.4–10.5)
Calcium: 9.9 mg/dL (ref 8.4–10.5)
Chloride: 105 mEq/L (ref 96–112)
Chloride: 102 mEq/L (ref 96–112)
Creatinine, Ser: 4.21 mg/dL — ABNORMAL HIGH (ref 0.4–1.2)
Creatinine, Ser: 3.29 mg/dL — ABNORMAL HIGH (ref 0.4–1.2)
GFR calc Af Amer: 14 mL/min — ABNORMAL LOW (ref >60)
GFR calc Af Amer: 18 mL/min — ABNORMAL LOW (ref >60)
GFR calc non Af Amer: 11 mL/min — ABNORMAL LOW (ref >60)
GFR calc non Af Amer: 15 mL/min — ABNORMAL LOW (ref >60)
Glucose, Bld: 159 mg/dL — ABNORMAL HIGH (ref 70–99)
Glucose, Bld: 165 mg/dL — ABNORMAL HIGH (ref 70–99)
Potassium: 3.3 mEq/L — ABNORMAL LOW (ref 3.5–5.1)
Potassium: 3.4 mEq/L — ABNORMAL LOW (ref 3.5–5.1)
Sodium: 151 mEq/L — ABNORMAL HIGH (ref 135–145)
Sodium: 145 mEq/L (ref 135–145)

## 2010-09-09 LAB — BRAIN NATRIURETIC PEPTIDE
Pro B Natriuretic peptide (BNP): 226 pg/mL — ABNORMAL HIGH (ref 0.0–100.0)
Pro B Natriuretic peptide (BNP): 81 pg/mL (ref 0.0–100.0)

## 2010-09-09 LAB — PHOSPHORUS: Phosphorus: 7.1 mg/dL — ABNORMAL HIGH (ref 2.3–4.6)

## 2010-09-12 ENCOUNTER — Inpatient Hospital Stay (HOSPITAL_COMMUNITY)
Admission: EM | Admit: 2010-09-12 | Discharge: 2010-09-22 | Payer: Self-pay | Source: Home / Self Care | Attending: Internal Medicine | Admitting: Internal Medicine

## 2010-09-13 DIAGNOSIS — F39 Unspecified mood [affective] disorder: Secondary | ICD-10-CM

## 2010-09-13 NOTE — H&P (Signed)
[name], CERVANTEZ NO.:  [id number]  MEDICAL RECORD NO.:  [id number]          PATIENT TYPE:  INP  LOCATION:  0103                         FACILITY:  [facility]  PHYSICIAN:  Vania Rea, M.D. DATE OF BIRTH:  09/29/1960  DATE OF ADMISSION:  09/12/2010 DATE OF DISCHARGE:                             HISTORY & PHYSICAL   PRIMARY CARE PHYSICIAN:  Dema Severin, NP at [facility].  CHIEF COMPLAINT:  Altered mental status since this morning.  HISTORY OF PRESENT ILLNESS:  This is a 50 year old obese Caucasian lady with a history of COPD, chronic pain, and depression, whose medications include Seroquel, Cymbalta and Percocet, who was discharged from this hospital 5 days ago after a 17-day hospital stay for altered mental status, acute-on-chronic respiratory failure, and acute renal failure. The patient's course was complicated by vent-dependent respiratory failure, but eventually blood work came back positive ethylene glycol and it was felt that there was some element of ethylene glycol poisoning contributing to her acute renal failure, even though she had no anion gap and even though there was no clear history of this ingestion.  The patient's baseline creatinine months past was known to be about 1.0. Discharge serum creatinine was 2.4.  Also, notes of that discharge, the patient was resumed on previous dose of sedatives and narcotic medications as prior to admission.  The patient declined nursing home placement.  The patient was discharged back to in care of her husband and has in fact been staying by her mother.  Her husband who now accompanies her reports that she has been doing fairly well and fairly strong, but since yesterday has been getting progressively more drowsy, more so today and they noticed that her oxygenation has become very low requiring increased use of oxygen. They brought her to the emergency room.  She was evaluated,  received Narcan and her mental status improved in response to Narcan.  She was considered for BiPAP, but she continued to get progressively more alert and the hospitalist service called to assist in management.  The patient denies any headache, chest pain, nausea, or vomiting.  She denies any palpitations.  She denies any worsening shortness of breath.  She denies lower extremity edema.  Husband insists she has been taking all of her medications just as prescribed.  PAST MEDICAL HISTORY: 1. Acute renal failure.  Baseline creatinine of 1.  Discharge     creatinine of 2.4 five days ago. 2. Ethylene glycol toxicity. 3. Acute-on-chronic respiratory failure. 4. Protein-calorie malnutrition. 5. Deconditioning. 6. Hypothyroidism. 7. Dysphasia. 8. Depression. 9. Morbid obesity. 10.COPD, on chronic home O2. 11.Aspiration pneumonia. 12.History of atrial flutter. 13.GERD. 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  MEDICATIONS: 1. Ensure 1 can twice daily. 2. Furosemide 20 mg daily. 3. Advair Diskus 250 mcg twice daily. 4. Xanax 1 mg every 6 hours. 5. Aspirin 325 mg daily. 6. Calcium with D daily. 7. Combivent 2 puffs 4 times daily as needed. 8. Crestor 40 mg at bedtime. 9. Cymbalta 60 mg 2 capsules each morning. 10.DuoNeb  daily for shortness of breath. 11.Synthroid 50 mcg daily. 12.Loratadine 10 mg at bedtime. 13.Lovaza 1 g 2 capsules daily. 14.Metoprolol 25 mg 1/2 tablet twice daily. 15.Nexium 40 mg twice daily. 16.Niaspan 1000 mg at bedtime. 17.Oxycodone and acetaminophen 10/325 mg every 6 hours as needed for     pain. 18.Potassium chloride 20 mEq daily. 19.Seroquel 300 mg 1/2 tablet at bedtime.  Her husband reports that he     has been giving her probably about third of a tablet at bedtime. 20.Spiriva 18 mcg daily. 21.Tums 1000 mg 2 tablets twice daily p.r.n. heartburn.  ALLERGIES:   LATEX.  SOCIAL HISTORY:  Currently living with her mother.  No history of alcohol or illicit drug use.  FAMILY HISTORY:  Notable for coronary artery disease, COPD, and cancer.  REVIEW OF SYSTEMS:  Other than noted above unremarkable.  PAST SURGICAL HISTORY:  History of cholecystectomy, hysterectomy, and a normal cath in 2004.  PHYSICAL EXAMINATION:  VITAL SIGNS:  Temperature 99.0, pulse 123, respirations 20, blood pressure initially 99/62 and now 129/85.  She is saturating 94% on 4 L. HEENT:  Pupils are round and equal.  Mucous membranes pink, dry, anicteric. NECK:  No cervical lymphadenopathy or thyromegaly.  She has a very thick neck.  Unable to appreciate any carotid bruit or thyromegaly. CHEST:  No crackles, but air movement is poor.  Her breathing is not labored. CARDIOVASCULAR SYSTEM:  Regular rhythm.  No murmur heard. ABDOMEN:  Massively obese, but soft and nontender. EXTREMITIES:  Without edema. CENTRAL NERVOUS SYSTEM:  Cranial nerves II through XII grossly intact. No focal lateralizing signs.  LABORATORY DATA:  Her ABG on 3 L shows a pH of 7.37, pCO2 43.5, pO2 44, saturating at 81%, this is prior to receiving Narcan.  Her white count is 6.4, hemoglobin 10.8, MCV 102, platelet count 242, otherwise unremarkable.  Her sodium is 137, potassium 4.3, chloride 100, CO2 27, glucose 115, BUN 15, creatinine 3.07, albumin is 3.5, calcium 9.1. Liver functions otherwise unremarkable.  Her PT, PTT, and INR are all normal.  Cardiac markers show CK-MB of 8.1, myoglobin greater than 500, and troponin undetectable.  Measured CK-MB shows a total CK of 872, CK- MB of 6.9 with a relative index of 0.8 with troponin of 0.14.  This is compatible with the elevated values that she had during her hospitalization where she has some degree of rhabdomyolysis.  Her salicylate levels were undetectable and acetaminophen level undetectable.  Urine drug screen positive for benzodiazepines and opiates,  which are part of her prescribed medications.  Her alcohol level was undetectable.  Urinalysis shows urine with speech gravity of 1.018.  It was cloudy.  It was otherwise unremarkable.  Lactic acid was 1.4 and ammonia level was 43.  A portable chest x-ray showed mild bibasilar airspace disease, but improved on the left, no new abnormality.  Her EKG shows sinus tachycardia.  ASSESSMENT: 1. Hypercarbic hypoxic respiratory failure, probably related to     depressed respiratory drive from psychotropic medications. 2. Acute renal failure. 3. Dehydration. 4. Depression. 5. Chronic obstructive pulmonary disease. 6. Gastroesophageal reflux disease. 7. Hypothyroidism. 8. Morbid obesity.  PLAN:  Since this patient is suffering from relative drug toxicity, we will admit her to the step-down unit for hydration and monitoring.  When she is stabilized, we will restart her psychotropic medications at renally adjusted doses.  Will consider stopping Cymbalta altogether  until renal function normalizes. Consideration could be given to consulting  the psychiatrist to assist with adjusting  the dose of her Seroquel and Cymbalta.  We will hold her Lasix while we hydrate.  Other plans as per orders.     Vania Rea, M.D.     LC/MEDQ  D:  09/12/2010  T:  09/12/2010  Job:  [id number]  cc:   Dema Severin, NP  Electronically Signed by Vania Rea M.D. on 09/13/2010 09:31:28 PM

## 2010-09-16 LAB — BASIC METABOLIC PANEL
BUN: 12 mg/dL (ref 6–23)
Calcium: 8.5 mg/dL (ref 8.4–10.5)
Creatinine, Ser: 2.48 mg/dL — ABNORMAL HIGH (ref 0.4–1.2)
GFR calc non Af Amer: 21 mL/min — ABNORMAL LOW (ref >60)
Glucose, Bld: 112 mg/dL — ABNORMAL HIGH (ref 70–99)
Potassium: 3.6 mEq/L (ref 3.5–5.1)

## 2010-09-16 LAB — LACTIC ACID, PLASMA: Lactic Acid, Venous: 1.4 mmol/L (ref 0.5–2.2)

## 2010-09-16 LAB — POCT I-STAT, CHEM 8
BUN: 20 mg/dL (ref 6–23)
Chloride: 103 mEq/L (ref 96–112)
Creatinine, Ser: 3 mg/dL — ABNORMAL HIGH (ref 0.4–1.2)
Glucose, Bld: 108 mg/dL — ABNORMAL HIGH (ref 70–99)
Hemoglobin: 11.2 g/dL — ABNORMAL LOW (ref 12.0–15.0)
Potassium: 4.3 mEq/L (ref 3.5–5.1)

## 2010-09-16 LAB — BLOOD GAS, ARTERIAL
Bicarbonate: 24.6 mEq/L — ABNORMAL HIGH (ref 20.0–24.0)
Drawn by: 33147
O2 Content: 3 L/min
Patient temperature: 98.6
pH, Arterial: 7.37 (ref 7.350–7.400)
pO2, Arterial: 44.1 mmHg — ABNORMAL LOW (ref 80.0–100.0)

## 2010-09-16 LAB — COMPREHENSIVE METABOLIC PANEL
AST: 35 U/L (ref 0–37)
Alkaline Phosphatase: 82 U/L (ref 39–117)
BUN: 15 mg/dL (ref 6–23)
GFR calc Af Amer: 20 mL/min — ABNORMAL LOW (ref >60)
Glucose, Bld: 115 mg/dL — ABNORMAL HIGH (ref 70–99)
Potassium: 4.3 mEq/L (ref 3.5–5.1)
Total Protein: 7.4 g/dL (ref 6.0–8.3)

## 2010-09-16 LAB — URINALYSIS, ROUTINE W REFLEX MICROSCOPIC
Nitrite: NEGATIVE
Urobilinogen, UA: 0.2 mg/dL (ref 0.0–1.0)
pH: 5.5 (ref 5.0–8.0)

## 2010-09-16 LAB — CBC
HCT: 29.6 % — ABNORMAL LOW (ref 36.0–46.0)
Hemoglobin: 10.8 g/dL — ABNORMAL LOW (ref 12.0–15.0)
MCH: 32.6 pg (ref 26.0–34.0)
MCH: 32.2 pg (ref 26.0–34.0)
MCHC: 32 g/dL (ref 30.0–36.0)
MCHC: 32.4 g/dL (ref 30.0–36.0)
MCV: 102.1 fL — ABNORMAL HIGH (ref 78.0–100.0)
MCV: 99.3 fL (ref 78.0–100.0)
Platelets: 276 10*3/uL (ref 150–400)
RBC: 3.31 MIL/uL — ABNORMAL LOW (ref 3.87–5.11)
RDW: 14.9 % (ref 11.5–15.5)
RDW: 15 % (ref 11.5–15.5)
WBC: 6.4 10*3/uL (ref 4.0–10.5)
WBC: 4.8 10*3/uL (ref 4.0–10.5)

## 2010-09-16 LAB — ACETAMINOPHEN LEVEL: Acetaminophen (Tylenol), Serum: <10 ug/mL — ABNORMAL LOW (ref 10–30)

## 2010-09-16 LAB — RAPID URINE DRUG SCREEN, HOSP PERFORMED
Amphetamines: NOT DETECTED
Barbiturates: NOT DETECTED

## 2010-09-16 LAB — POCT CARDIAC MARKERS

## 2010-09-16 LAB — URINE CULTURE

## 2010-09-16 LAB — MAGNESIUM: Magnesium: 1.7 mg/dL (ref 1.5–2.5)

## 2010-09-16 LAB — ETHANOL: Alcohol, Ethyl (B): <5 mg/dL (ref 0–10)

## 2010-09-16 LAB — SALICYLATE LEVEL: Salicylate Lvl: <4 mg/dL (ref 2.8–20.0)

## 2010-09-16 LAB — DIFFERENTIAL
Basophils Absolute: 0.1 10*3/uL (ref 0.0–0.1)
Eosinophils Relative: 3 % (ref 0–5)
Monocytes Relative: 11 % (ref 3–12)

## 2010-09-16 NOTE — Progress Notes (Addendum)
[name], [name] NO.:  [id number]  MEDICAL RECORD NO.:  [id number]          PATIENT TYPE:  INP  LOCATION:  3309                         FACILITY:  [facility]  PHYSICIAN:  Osvaldo Shipper, MD     DATE OF BIRTH:  1960/10/23                                PROGRESS NOTE   The patient was admitted to the Critical Care Service at the beginning of the admission and was transferred to the [facility] team about 4 days ago.  Consultations during this admission include: 1. Pulmonary Critical Care Medicine. 2. Nephrology. 3. Psychiatry. 4. [facility] saw the patient once.  Imaging studies done include  multiple chest x-rays on August 20, 2010, August 21, 2010, August 22, 2010, August 23, 2010, August 24, 2010, August 25, 2010, August 26, 2010, August 27, 2010, August 28, 2010, August 29, 2010, and September 01, 2010.  These x-rays initially showed pneumonia and atelectasis.  Subsequent films showed placement of ET tube as well.  IJ line was also seen in subsequent films.  On August 22, 2010, the x-ray showed improved air space disease. Subsequent films showed that the ET-tube had been removed with worsening of bibasilar opacities.  Interstitial edema was thought to be present and the x-ray done on September 01, 2010, showed no change in aeration; however, bilateral lower lobe opacities were still present.   The patient also had a portable abdominal ultrasound which showed diffuse fatty infiltration of the liver.    CAT scan of the abdomen and pelvis on August 22, 2010, showed no mesenteric ischemia, indeterminate lesion in the right kidney was noted which could be a complex cyst or a solid lesion and a heterogeneous hepatic steatoses were noted.    CT of the chest on August 22, 2010, showed left greater than right lower lobe airspace opacities suspicious for possible aspiration pneumonia.   The patient had an abdominal film on August 29, 2010, which showed feeding tube over the body of the stomach.  Other studies include an echocardiogram which showed EF to be 45-50% with diffuse hypokinesis.  Grade 1 diastolic dysfunction was noted. Mild mitral regurgitation was also seen.  PA pressure was mildly elevated at 35 mm.  PERTINENT LABORATORY: At the time of admission, she was found to have ethylene glycol in her blood, the level was 14.08.  Urine drug screen was positive for benzodiazepines and opiates.  UA was remarkably abnormal with positive nitrite, trace leukocytes, hyaline casts, many wbc's, hematuria, proteinuria was also seen.  Blood cultures done initially did not show any growth as did the sputum cultures.  Hepatitis antibodies were all negative.  Troponins were mildly elevated up 1.88.  HbA1c was 5.9. Serum osmolality was 303.  Lipase level was normal.  Creatinine was found to be initially 4.3, rising up to 9.90 and then declining and today's reading is 3.39.  She was also hyponatremic and hypokalemic during the course of this admission.  Hemoglobin is 10.3 as of today.  Diagnoses at the time of this progress note: 1. Antifreeze toxicity with acute renal failure, requiring     hemodialysis during the  initial part of this admission. 2. Acute and chronic respiratory failure requiring high FIO2. 3. History of chronic obstructive pulmonary disease with severe     emphysema on home oxygen. 4. Obesity. 5. Anemia, possibly secondary to chronic disease. 6. Moderate malnutrition on Panda feeds at this time. 7. Dysphagia, likely due to acute illness. 8. History of hypothyroidism. 9. Depression, no suicidal ideations. 10.Deconditioning. 11.Rhabdomyolysis, resolved. 12.Transaminitis, resolved. 13.Elevated cardiac enzymes, thought to be due to acute illness. 14.Hyperglycemia. 15.Altered mental status due to acute illness, improved.  BRIEF HOSPITAL COURSE: Briefly, this is a 50 year old Caucasian female who  presented to the hospital with altered mental status.  She was found to be in acute renal failure.  She was also found to be in respiratory acidosis.  The patient was intubated.  She was found to have ethylene glycol in her blood.  The renal function continued to get worse, Nephrology was involved.  She was also in rhabdomyolysis. 1. Ethylene glycol toxicity.  This patient was found to have ethylene     glycol in her blood.  The patient's renal function deteriorated,     her rhabdomyolysis got worse.  The patient was seen by Nephrology     and they had to dialyze her during the course of this     hospitalization.  The patient's renal function subsequently started     improving.  Nephrology has been following her and they signed off 2     days ago.  Renal function this morning showed a BUN of 54,     creatinine of 3.39.  She is making urine, unfortunately urine out     has not been accurately measured.  Renal function actually is a     little bit higher today than it was yesterday.  We will recheck     another level tomorrow and if this pattern continues to get worse,     we may have to recall Nephrology. 2. Pulmonary status.  She came in with acute respiratory failure.  She     has chronic respiratory failure from COPD.  She is on home O2,     however, her husband says that she is not compliant and she     continues to smoke.  She was initially intubated for a couple of     days, then extubated and spent many days on BiPAP, subsequently was     transitioned over to 100% non-rebreather and currently is on 50%     Ventimask.  Over the last 2 days, her work of breathing has     increased.  ABG did not show any acidosis.  I discussed this with     Dr. Cleora Fleet with Pulmonary and he recommended some Lasix as it     appeared that the patient has been positive for the last few days.     We gave her Lasix yesterday, put her on BiPAP overnight.  I think     she is going to require BiPAP overnight  for a few more days.  We     await further reduction in her FIO2 requirements.  If this is not     achieved in the next couple of days, we may have to recall     Pulmonary, although I discussed with them yesterday.  At this time,     the patient will likely remain in step-down unit until her O2     requirements have been decreased.  She did have low-grade fever  yesterday and because of the opacities and her slight worsening in     pulmonary status, I did start her on Zosyn. 3. Malnutrition.  She is currently getting feeds through her Panda     tube.  The plan is for a modified barium swallow today depending on     which p.o. intake will be resumed. 4. Severely deconditioned.  PT/OT has been seeing her.  CIR consult     was placed and they do not think she is quite yet appropriate for     inpatient rehab. 5. History of hypothyroidism.  She is on Synthroid. 6. History of rhabdomyolysis.  As mentioned about, it has improved and     resolved. 7. Elevated LFTs also have been trending down and her INR almost down     to normal. 8. Altered mental status was possibly from her acute illness and     metabolic anomalies and that has also significantly improved. 9. Electrolyte abnormalities have been noted in the last couple of     days including hypernatremia, hypokalemia, and these have been     aggressively repleted and for her hypernatremia, she was given D5     water per Nephrology for 1 day.  Once she starts taking oral fluids     and free water, hopefully the sodium levels should remain low.  Today that is September 03, 2010, the patient is sitting on the bed, appears to be a little bit more comfortable today than yesterday.  She does have a flat affect.  Denies any complaints, does not talk much actually.  INS AND OUTS: Unfortunately, urine output was not accurately measured yesterday, so I do not know what exactly her fluid balance is.  PHYSICAL EXAMINATION: VITAL SIGNS:  Her  temperature over the course of the last 24 hours, the T-max has been 100.2, last recorded reading is 99.9; heart rate 100-110, blood pressure 123/77, saturation on the monitor currently is about 91% and she is on 50% Ventimask. GENERAL:  This is an obese white female in no distress. HEENT:  Head is normocephalic, atraumatic.  Oral mucous membranes moist. NECK:  Soft and supple. LUNGS:  Decreased air entry at the bases.  No wheezing is present.  No definite crackles are heard. CARDIOVASCULAR:  S1, S2 is tachycardic, regular.  No murmurs appreciated.  No S3, S4, rubs, or bruits.  No pedal edema.  ABDOMEN: Soft, nontender, nondistended.  Bowel sounds are present.  No masses or organomegaly is appreciated.  NEUROLOGIC:  She is alert.  No focal neurological deficits are present.  ASSESSMENT AND PLAN: As per above.  Essentially, we await improvement in her oxygen requirements.  We have to monitor her renal function very closely.  We did start her on Zosyn yesterday, so we want to make sure that the renal function does not get worse because of the antibiotic.  I discussed this case with Dr. Cleora Fleet yesterday.  If her pulmonary status does not improve, she may warrant to reconsult with Pulmonary.  Nephrology has also signed off, we may need to be reconsult them again if her renal function does not improve as expected, although it has significantly improved since admission.  She is also followed by Psychiatry, Dr. Rogers Blocker and he recommended Lexapro, so we will initiate that at this time.   Osvaldo Shipper, MD     GK/MEDQ  D:  09/03/2010  T:  09/03/2010  Job:  [id number]  Electronically Signed by Osvaldo Shipper MD on 09/16/2010 08:46:11  PM

## 2010-09-17 LAB — CBC
HCT: 28.9 % — ABNORMAL LOW (ref 36.0–46.0)
HCT: 29.4 % — ABNORMAL LOW (ref 36.0–46.0)
Hemoglobin: 9.7 g/dL — ABNORMAL LOW (ref 12.0–15.0)
Hemoglobin: 10 g/dL — ABNORMAL LOW (ref 12.0–15.0)
MCH: 32.5 pg (ref 26.0–34.0)
MCHC: 33.7 g/dL (ref 30.0–36.0)
MCV: 96.3 fL (ref 78.0–100.0)
MCV: 96.4 fL (ref 78.0–100.0)
MCV: 97.8 fL (ref 78.0–100.0)
Platelets: 285 10*3/uL (ref 150–400)
Platelets: 286 10*3/uL (ref 150–400)
RBC: 3 MIL/uL — ABNORMAL LOW (ref 3.87–5.11)
RBC: 3.12 MIL/uL — ABNORMAL LOW (ref 3.87–5.11)
RDW: 14.6 % (ref 11.5–15.5)
RDW: 14.7 % (ref 11.5–15.5)
WBC: 4.9 10*3/uL (ref 4.0–10.5)
WBC: 6.1 10*3/uL (ref 4.0–10.5)

## 2010-09-17 LAB — BLOOD GAS, ARTERIAL
Acid-Base Excess: 0.2 mmol/L (ref 0.0–2.0)
Acid-Base Excess: 1.3 mmol/L (ref 0.0–2.0)
Acid-Base Excess: 3 mmol/L — ABNORMAL HIGH (ref 0.0–2.0)
Bicarbonate: 23.1 mEq/L (ref 20.0–24.0)
Bicarbonate: 26.4 mEq/L — ABNORMAL HIGH (ref 20.0–24.0)
Delivery systems: POSITIVE
Drawn by: 326301
FIO2: 0.6 %
Inspiratory PAP: 10
Inspiratory PAP: 10
O2 Saturation: 90.3 %
Patient temperature: 98.6
TCO2: 22.9 mmol/L (ref 0–100)
TCO2: 24.1 mmol/L (ref 0–100)
pCO2 arterial: 32.8 mmHg — ABNORMAL LOW (ref 35.0–45.0)
pCO2 arterial: 37.3 mmHg (ref 35.0–45.0)
pH, Arterial: 7.438 — ABNORMAL HIGH (ref 7.350–7.400)
pO2, Arterial: 52.9 mmHg — ABNORMAL LOW (ref 80.0–100.0)
pO2, Arterial: 60.2 mmHg — ABNORMAL LOW (ref 80.0–100.0)

## 2010-09-17 LAB — BASIC METABOLIC PANEL
BUN: 11 mg/dL (ref 6–23)
BUN: 14 mg/dL (ref 6–23)
Calcium: 9 mg/dL (ref 8.4–10.5)
Chloride: 108 mEq/L (ref 96–112)
Creatinine, Ser: 1.78 mg/dL — ABNORMAL HIGH (ref 0.4–1.2)
GFR calc non Af Amer: 27 mL/min — ABNORMAL LOW (ref >60)
GFR calc non Af Amer: 31 mL/min — ABNORMAL LOW (ref >60)
Glucose, Bld: 127 mg/dL — ABNORMAL HIGH (ref 70–99)
Glucose, Bld: 159 mg/dL — ABNORMAL HIGH (ref 70–99)
Potassium: 3.7 mEq/L (ref 3.5–5.1)
Sodium: 142 mEq/L (ref 135–145)

## 2010-09-17 LAB — COMPREHENSIVE METABOLIC PANEL
ALT: 19 U/L (ref 0–35)
Albumin: 3 g/dL — ABNORMAL LOW (ref 3.5–5.2)
Alkaline Phosphatase: 53 U/L (ref 39–117)
Chloride: 104 mEq/L (ref 96–112)
Glucose, Bld: 168 mg/dL — ABNORMAL HIGH (ref 70–99)
Potassium: 4.5 mEq/L (ref 3.5–5.1)
Sodium: 140 mEq/L (ref 135–145)
Total Protein: 6.5 g/dL (ref 6.0–8.3)

## 2010-09-17 LAB — MAGNESIUM: Magnesium: 1.9 mg/dL (ref 1.5–2.5)

## 2010-09-18 DIAGNOSIS — F39 Unspecified mood [affective] disorder: Secondary | ICD-10-CM

## 2010-09-18 LAB — CBC
HCT: 28.3 % — ABNORMAL LOW (ref 36.0–46.0)
Hemoglobin: 9.3 g/dL — ABNORMAL LOW (ref 12.0–15.0)
MCH: 32.4 pg (ref 26.0–34.0)
MCHC: 32.9 g/dL (ref 30.0–36.0)

## 2010-09-18 LAB — COMPREHENSIVE METABOLIC PANEL
BUN: 16 mg/dL (ref 6–23)
CO2: 28 mEq/L (ref 19–32)
Chloride: 105 mEq/L (ref 96–112)
Creatinine, Ser: 1.66 mg/dL — ABNORMAL HIGH (ref 0.4–1.2)
GFR calc non Af Amer: 33 mL/min — ABNORMAL LOW (ref >60)
Total Bilirubin: 0.5 mg/dL (ref 0.3–1.2)

## 2010-09-19 LAB — BASIC METABOLIC PANEL
GFR calc non Af Amer: 34 mL/min — ABNORMAL LOW (ref >60)
Glucose, Bld: 139 mg/dL — ABNORMAL HIGH (ref 70–99)
Potassium: 3.5 mEq/L (ref 3.5–5.1)
Sodium: 136 mEq/L (ref 135–145)

## 2010-09-19 LAB — DIFFERENTIAL
Basophils Absolute: 0 10*3/uL (ref 0.0–0.1)
Lymphocytes Relative: 14 % (ref 12–46)
Neutro Abs: 4.1 10*3/uL (ref 1.7–7.7)

## 2010-09-19 LAB — CBC
HCT: 33.4 % — ABNORMAL LOW (ref 36.0–46.0)
Hemoglobin: 11.6 g/dL — ABNORMAL LOW (ref 12.0–15.0)
RDW: 13.7 % (ref 11.5–15.5)
WBC: 5.1 10*3/uL (ref 4.0–10.5)

## 2010-09-19 NOTE — Consult Note (Addendum)
[name], [name]                ACCOUNT NO.:  [id number]  MEDICAL RECORD NO.:  [id number]          PATIENT TYPE:  INP  LOCATION:  1241                         FACILITY:  [facility]  PHYSICIAN:  Eulogio Ditch, MD DATE OF BIRTH:  Sep 17, 1960  DATE OF CONSULTATION:  09/13/2010 DATE OF DISCHARGE:                                CONSULTATION   REASON FOR CONSULTATION:  Questionable history of overdose.  HISTORY OF PRESENT ILLNESS:  This is a 50 year old white female, who was admitted to [facility] last time because of questionable Diethylene glycol overdose and she was in acute respiratory failure along with acute renal failure.  I follow this patient and spoke with the family/husband a number of times. Patient refused that it was a suicide attempt and the family also told me that it was not a suicide attempt.  They do not know how Diethylene glycol was positive in her system.  Patient was discharged from [facility].  I saw this patient again on the floor, when I saw the patient's husband was sitting with the patient and they were both crying.  Patient again refused that she did not overdose on any medication. But patient was given Narcan and her mental status improved in response to the Narcan. Patient was discharged on Xanax and oxycodone at the time of discharge. The husband is very upset at why they started back on those medications.  Patient is logical and goal directed to the interview, not internally preoccupied.  Denies hearing any voices and denies suicidal ideations.  PAST MEDICAL HISTORY: 1. Hypothyroidism. 2. Morbid obesity. 3. COPD. 4. History of atrial flutter. 5. GERD. 6. 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  CURRENT PSYCHIATRIC MEDICATIONS: 1. Xanax 1 mg every 6 hours. 2. Cymbalta 120 mg p.o. daily. 3. Oxycodone/acetaminophen 10/325 every 6 hours as needed for pain. 4. Seroquel  150 mg at bedtime.  ALLERGIES:  PATIENT IS ALLERGIC TO LATEX.  SOCIAL HISTORY:  Currently living with her mother, husband is also in touch with the patient, but they are living separately.  SUBSTANCE ABUSE HISTORY:  Patient denies use of alcohol or any other drugs.  LABORATORY DATA:  Urine drug screen positive for benzos and opiates, which were part of her prescribed medications.  Her alcohol level was undetectable at that time of admission. Magnesium 1.7, glucose 112, creatinine 2.48, which is as per medical baseline for the patient, hemoglobin 9.6. MUTS are negative.  Urine drug screen:  Positive for benzos and opioids.  Alcohol level less than 5.  Chest x-ray:  No new abnormality found.  EKG done on January 19:  QT/QTC 320/446 milliseconds.  Patient has a sinus tachycardia.  DIAGNOSES:  AXIS I:  Mood disorder, not otherwise specified, depressive disorder, not otherwise specified, anxiety disorder, not otherwise specified. AXIS II:  Deferred. AXIS III:  See medical notes. AXIS IV:  Chronic medical and mental issues. AXIS V:  40.  RECOMMENDATIONS: 1. I discontinued the patient's oxycodone and Xanax. 2. I discontinued patient's Seroquel. 3. I started the patient on Risperdal  0.5 twice a day. 4. I told the patient that she will get benefit by stabilization at     behavioral health.  Patient and her husband agreed with the     treatment plan. 5. Patient should be transferred to behavioral health once medically     stable.  There is a question in my mind whether this patient is     overdosing to try to hurt herself and there also seems to be family     dynamics involved.  So, this patient is a danger to herself because     of the 2 back-to-back admissions.  She would benefit by     stabilization and behavioral health and for further observation and     medication stabilization.     Eulogio Ditch, MD     SA/MEDQ  D:  09/13/2010  T:  09/13/2010  Job:   [id number]  Electronically Signed by Eulogio Ditch  on 09/19/2010 05:35:09 AM

## 2010-09-21 DIAGNOSIS — F064 Anxiety disorder due to known physiological condition: Secondary | ICD-10-CM

## 2010-09-21 LAB — PROTIME-INR
INR: 1.23 (ref 0.00–1.49)
Prothrombin Time: 15.7 seconds — ABNORMAL HIGH (ref 11.6–15.2)

## 2010-09-21 LAB — DIFFERENTIAL
Basophils Absolute: 0 10*3/uL (ref 0.0–0.1)
Basophils Relative: 0 % (ref 0–1)
Eosinophils Absolute: 0 10*3/uL (ref 0.0–0.7)
Lymphs Abs: 1 10*3/uL (ref 0.7–4.0)
Neutrophils Relative %: 73 % (ref 43–77)

## 2010-09-21 LAB — CBC
MCV: 94.1 fL (ref 78.0–100.0)
Platelets: 231 10*3/uL (ref 150–400)
RBC: 3.41 MIL/uL — ABNORMAL LOW (ref 3.87–5.11)
WBC: 5.8 10*3/uL (ref 4.0–10.5)

## 2010-09-22 DIAGNOSIS — F1910 Other psychoactive substance abuse, uncomplicated: Secondary | ICD-10-CM

## 2010-09-22 LAB — HEPARIN LEVEL (UNFRACTIONATED)
Heparin Unfractionated: >2 IU/mL — ABNORMAL HIGH (ref 0.30–0.70)
Heparin Unfractionated: 1.36 IU/mL — ABNORMAL HIGH (ref 0.30–0.70)

## 2010-09-22 LAB — BASIC METABOLIC PANEL
Chloride: 99 mEq/L (ref 96–112)
GFR calc Af Amer: 36 mL/min — ABNORMAL LOW (ref >60)
GFR calc non Af Amer: 30 mL/min — ABNORMAL LOW (ref >60)
Potassium: 3 mEq/L — ABNORMAL LOW (ref 3.5–5.1)
Sodium: 140 mEq/L (ref 135–145)

## 2010-09-22 LAB — CBC
Platelets: 224 10*3/uL (ref 150–400)
RBC: 3.44 MIL/uL — ABNORMAL LOW (ref 3.87–5.11)
WBC: 8 10*3/uL (ref 4.0–10.5)

## 2010-09-22 LAB — PROTIME-INR
INR: 2.33 — ABNORMAL HIGH (ref 0.00–1.49)
Prothrombin Time: 25.7 seconds — ABNORMAL HIGH (ref 11.6–15.2)

## 2010-09-23 LAB — BETA-2-GLYCOPROTEIN I ABS, IGG/M/A
Beta-2 Glyco I IgG: 0 G Units (ref <20)
Beta-2-Glycoprotein I IgM: 12 M Units (ref <20)

## 2010-09-23 LAB — PROTEIN C ACTIVITY: Protein C Activity: 200 % — ABNORMAL HIGH (ref 75–133)

## 2010-09-23 LAB — LUPUS ANTICOAGULANT PANEL
DRVVT: 44.8 secs — ABNORMAL HIGH (ref 36.2–44.3)
PTT Lupus Anticoagulant: 37 secs (ref 30.0–45.6)

## 2010-09-23 LAB — PROTEIN S ACTIVITY: Protein S Activity: 117 % (ref 69–129)

## 2010-09-24 LAB — PROTEIN S, TOTAL: Protein S Ag, Total: 127 % (ref 70–140)

## 2010-09-29 NOTE — Discharge Summary (Signed)
[name], MANIACI                ACCOUNT NO.:  [id number]  MEDICAL RECORD NO.:  [id number]          PATIENT TYPE:  INP  LOCATION:  1433                         FACILITY:  [facility]  PHYSICIAN:  Conley Canal, MD      DATE OF BIRTH:  [birth year]  DATE OF ADMISSION:  09/12/2010 DATE OF DISCHARGE:                        DISCHARGE SUMMARY - REFERRING   CONSULTING PHYSICIANS: 1. Pulmonary and Critical Care 2. Psychiatry, Eulogio Ditch, MD  PROBLEM LIST: 1. Acute toxic metabolic encephalopathy, combination of acute-on-     chronic hypercarbic/hypoxic respiratory failure, narcotics, acute     renal failure. 2. Acute-on-chronic respiratory failure, element of acute     decompensation of chronic obstructive pulmonary disease, narcotics,     and possible pneumonia. 3. Hypothyroidism. 4. History of atrial flutter. 5. Morbid obesity. 6. Gastroesophageal reflux disease. 7. Tobacco habituation. 8. Mood disorder, depressive disorder.PROCEDURES PERFORMED:  Chest x-rays, last one January 22nd, showing perihilar bronchitic changes with patchy densities in the lung bases bilaterally which are not significantly changed with some suggestion of mild interstitial edema.  HOSPITAL COURSE:  Ms. [name] was admitted on January 19th with altered mental status.  She had just been discharged from the hospital 5 days prior to this after a prolonged hospital stay in which she had acute-on- chronic respiratory failure with findings of ethylene glycol toxicity. At the time of admission, the patient was found to be encephalopathic with an ammonia level of 43, in acute renal failure with creatinine of 3.07, and her urine drug screen was positive for benzodiazepines and opiates.  Blood gas showed pH of 7.37, PCO2 43, PO2 44, this was on 3 L nasal cannula.  The patient eventually required BiPAP support on January 21st requiring pulmonary input.  She gradually improved, but was noted to have tachycardia  which could not be explained.  Initially, there was no suspicion for respiratory infection, but given the chest x-ray findings and the persistent tachycardia, the patient was started on Avelox on January 23rd.  She has shown significant improvement since that time with her heart rate settling down, she is more comfortable respiratory wise.  The patient was seen by Psychiatry in this admission, who felt that the patient should be transferred to [facility] once medically cleared.  I discussed with Dr. Eulogio Ditch whose concern was that there might be some family dynamics going on, and the patient might be trying to hurt herself.  The picture is not clear, but I have discussed with the patient's husband as well as her sister. She has otherwise done relatively well, came out of the unit on January 23rd.  Today, she complains of inability to sleep and she is also craving for nicotine.  PHYSICAL EXAMINATION:  GENERAL:  She is not in acute distress. VITAL SIGNS:  Blood pressure 129/81, heart rate 98, temperature 98, respirations 16, oxygen saturation is 90% on 4 L nasal cannula. HEAD, EAR, NOSE, AND THROAT EXAMINATION:  No jugular venous distention. No carotid bruits. RESPIRATORY SYSTEM:  Reduced air entry bilaterally with no wheezing. Some basilar rhonchi. CARDIOVASCULAR SYSTEM:  First and second heart sounds heard.  No murmurs.  Pulse regular. ABDOMEN:  Obese, otherwise soft and nontender.  No palpable organomegaly.  Bowel sounds are normal. CNS:  The patient is alert, oriented to person, place, and time with no acute focal neurological deficits. EXTREMITIES:  No pedal edema.  Peripheral pulses equal.  LABORATORY DATA:  Labs were reviewed, significant for WBC 6.5, hemoglobin 9.3, hematocrit 28.3, platelet count 268.  BNP 448. Electrolytes show sodium 142, potassium 4.5, BUN 16, creatinine 1.66. Transaminases normal.  Of note is that the patient had 2-D echocardiogram  on December 28th showing an EF of 45% to 50% with diffuse hypokinesis as well as grade 1 diastolic dysfunction.  ASSESSMENT AND PLAN: 1. Acute-on-chronic respiratory failure, concern for pneumonia.  The     patient on Avelox, to complete 7 days.  To continue steroid taper     per Pulmonary, also bronchodilators and oxygen supplementation. 2. Mood and depressive disorder.  Agree with Psychiatry that the     patient needs inpatient behavioral health once medically stable,     hopefully in the next 2 days or so.  The patient should be     transferred to [facility]  We will continue management per     Psychiatry. 3. Grade 1 diastolic dysfunction, ejection fraction 45% to 50%.  The     patient currently on aspirin, Lopressor, Crestor.  We will add low-     dose Lasix.  Otherwise, clinically stable. 4. Tobacco habituation.  Smoking cessation counseling given.  We will     add nicotine patch. 5. Insomnia.  Add Ambien. 6. Acute kidney injury, multifactorial etiology, has improved. 7. Gastroesophageal reflux disease.  PPI. 8. Deep venous thrombosis prophylaxis.  Lovenox.     Conley Canal, MD     SR/MEDQ  D:  09/17/2010  T:  09/17/2010  Job:  [id number]  Electronically Signed by Conley Canal  on 09/29/2010 09:28:17 PM

## 2010-10-03 NOTE — Discharge Summary (Signed)
[name], [name]                ACCOUNT NO.:  [id number]  MEDICAL RECORD NO.:  [id number]          PATIENT TYPE:  INP  LOCATION:  1433                         FACILITY:  [facility]  PHYSICIAN:  Calvert Cantor, M.D.     DATE OF BIRTH:  [birth year]  DATE OF ADMISSION:  09/12/2010 DATE OF DISCHARGE:  09/22/2010                              DISCHARGE SUMMARY   PRIMARY CARE PHYSICIAN:  Dema Severin, who is actually a Publishing rights manager at [employer].  PRESENTING COMPLAINT:  Altered mental status.  Please see discharge summary dictated on September 17, 2010, by Dr. Venetia Constable, Job # [id number].  Subsequent to the discharge summary that was dictated on January 24, it was noted that despite treatment of the patient's pneumonia, she continued to be hypoxic.  At this point, a CT of her chest was ordered by Dr. Ashley Royalty.  CT results are as follows:  Acute nonocclusive pulmonary emboli in the subsegmental arteries of the left lower lobe without any evidence of right heart strain.  She also has moderate-to- severe emphysema, 4 mm right upper lobe pulmonary nodule.  At this point, the patient was started on anticoagulation with Lovenox and Coumadin.  The Lovenox resulted in a hematoma formation in her stomach and, therefore, she was switched over to heparin.  Her INR is now therapeutic at 2.33 and, therefore, she is ready for discharge.  I have spoken to her about frequent PT/INR checks to ensure that she is on the right dose of Coumadin.  She agrees to following up with Mauricio Po in 2 days to have an INR performed.  DISCHARGE MEDICATIONS: 1. Aspirin 81 mg daily. 2. Coumadin 5 mg daily. 3. Avelox 400 mg daily.  She just needs to take this for 1 more day to     complete a 7-day course for probable community-acquired pneumonia. 4. Nicotine patch 14 mg daily for 1 week, followed by 7 mg daily for 1     week. 5. Prednisone 20 mg for 2 days, then 10 mg for 2 days, then 5 mg for 2  days. 6. Risperdal 1 mg by mouth at bedtime.  Continue the following home medications: 1. Advair 250/50 twice a day. 2. Alprazolam 1 mg q.6 h as needed. 3. Calcium plus vitamin D 1 tablet daily. 4. Combivent inhaler 2 puffs q.4 h as needed. 5. Cymbalta 60 mg 2 tablets every morning. 6. DuoNebs 1 vial daily. 7. Ensure 237 mL twice a day. 8. Furosemide 20 mg daily. 9. Levothyroxine 50 mcg daily. 10.Loratadine 10 mg daily at bedtime. 11.Lovaza 1 gram 2 capsules every morning. 12.Metoprolol 25 mg 1/2 tablet twice a day. 13.Nexium 40 mg twice a day. 14.Niaspan 1000 mg daily at bedtime. 15.Oxycodone/acetaminophen 10/325 1 tablet q.6 h as needed. 16.Potassium chloride 20 mEq daily. 17.Seroquel 300 mg 1/2 tablet at bedtime. 18.Spiriva 18 mcg daily. 19.Tums 1000 mg 2 tablets twice a day.  The patient was previously on 325 mg of aspirin; please note this has been decreased to 81 mg.  DISCHARGE DIAGNOSES: 1. Delirium on admission secondary to acute toxic metabolic     encephalopathy thought  to be secondary to respiratory failure,     narcotics and acute renal failure. 2. Acute on chronic respiratory failure secondary to chronic     obstructive pulmonary disease, narcotics and possible community-     acquired pneumonia. 3. Renal failure, unsure if this is acute or chronic, but it has     remained stable during hospital stay.  Creatinine on discharge is     1.82. 4. Hypothyroidism. 5. History of atrial flutter. 6. Gastroesophageal reflux disease. 7. Tobacco abuse. 8. Mood disorder. 9. Pulmonary emboli.  The patient is being discharged in stable condition.  PHYSICAL EXAMINATION:  LUNGS:  Clear bilaterally with decreased breath sounds. HEART:  Regular rate and rhythm.  No murmurs. ABDOMEN:  Soft, nontender, nondistended.  Bowel sounds positive.  FOLLOWUP INSTRUCTIONS:  Follow up with Mauricio Po in 2 days for an INR check.  Time on discharge was 60 minutes.     Calvert Cantor, M.D.     SR/MEDQ  D:  09/24/2010  T:  09/24/2010  Job:  [id number]  cc:   Dema Severin, NP  Electronically Signed by Calvert Cantor M.D. on 10/03/2010 12:28:56 PM

## 2010-10-11 ENCOUNTER — Emergency Department (HOSPITAL_COMMUNITY)
Admission: EM | Admit: 2010-10-11 | Discharge: 2010-10-12 | Disposition: A | Discharge status: Home / Self Care | Payer: Medicare Other | Attending: Emergency Medicine | Admitting: Emergency Medicine

## 2010-10-11 DIAGNOSIS — F17200 Nicotine dependence, unspecified, uncomplicated: Secondary | ICD-10-CM | POA: Insufficient documentation

## 2010-10-11 DIAGNOSIS — J449 Chronic obstructive pulmonary disease, unspecified: Secondary | ICD-10-CM | POA: Insufficient documentation

## 2010-10-11 DIAGNOSIS — R791 Abnormal coagulation profile: Secondary | ICD-10-CM | POA: Insufficient documentation

## 2010-10-11 DIAGNOSIS — J4489 Other specified chronic obstructive pulmonary disease: Secondary | ICD-10-CM | POA: Insufficient documentation

## 2010-10-11 DIAGNOSIS — I509 Heart failure, unspecified: Secondary | ICD-10-CM | POA: Insufficient documentation

## 2010-10-11 DIAGNOSIS — G8929 Other chronic pain: Secondary | ICD-10-CM | POA: Insufficient documentation

## 2010-10-11 DIAGNOSIS — E785 Hyperlipidemia, unspecified: Secondary | ICD-10-CM | POA: Insufficient documentation

## 2010-10-11 DIAGNOSIS — Z79899 Other long term (current) drug therapy: Secondary | ICD-10-CM | POA: Insufficient documentation

## 2010-10-11 DIAGNOSIS — K219 Gastro-esophageal reflux disease without esophagitis: Secondary | ICD-10-CM | POA: Insufficient documentation

## 2010-10-11 LAB — BASIC METABOLIC PANEL
CO2: 33 mEq/L — ABNORMAL HIGH (ref 19–32)
Calcium: 8.5 mg/dL (ref 8.4–10.5)
Creatinine, Ser: 1.53 mg/dL — ABNORMAL HIGH (ref 0.4–1.2)
Glucose, Bld: 120 mg/dL — ABNORMAL HIGH (ref 70–99)

## 2010-10-11 LAB — CBC
HCT: 34.6 % — ABNORMAL LOW (ref 36.0–46.0)
Hemoglobin: 10.9 g/dL — ABNORMAL LOW (ref 12.0–15.0)
MCH: 31.3 pg (ref 26.0–34.0)
MCHC: 31.5 g/dL (ref 30.0–36.0)
MCV: 99.4 fL (ref 78.0–100.0)

## 2010-10-11 LAB — DIFFERENTIAL
Basophils Relative: 1 % (ref 0–1)
Lymphocytes Relative: 48 % — ABNORMAL HIGH (ref 12–46)
Monocytes Absolute: 0.6 10*3/uL (ref 0.1–1.0)
Monocytes Relative: 11 % (ref 3–12)
Neutro Abs: 2 10*3/uL (ref 1.7–7.7)

## 2010-10-31 NOTE — Progress Notes (Signed)
[name], [name] NO.:  [id number]  MEDICAL RECORD NO.:  [id number]          PATIENT TYPE:  INP  LOCATION:  1433                         FACILITY:  [facility]  PHYSICIAN:  Altha Harm, MDDATE OF BIRTH:  09-02-1960                                PROGRESS NOTE   PRIMARY CARE PHYSICIAN: The patient is seen by nurse practitioner Rendleman.  DIAGNOSES: Diagnoses at the time of this summary include the following: 1. Pulmonary embolus. 2. Acute on chronic hypoxic respiratory failure. 3. Acute toxic metabolic encephalopathy, resolved. 4. Acute renal insufficiency, resolving 5. Hypothyroidism. 6. History of atrial flutter. 7. Gastroesophageal reflux disease. 8. Tobacco use disorder. 9. Mood disorder. 10.Dysphagia, resolved. 11.Exacerbation of chronic obstructive pulmonary disease.  DISCHARGE MEDICATIONS: To be dictated at time of discharge.  CONSULTANTS: 1. Psychiatry. 2. Pulmonary Critical Care.Please note that this summary encompasses everything from September 17, 2010 to September 21, 2010.  Please refer to the summary by Dr. Darnelle Catalan for details.  The patient was scheduled to be discharged home, however, continued to have significant hypoxemia.  Particularly when the patient walked, her O2 sats dropped down into the low 80s with heart rate in the 120s.  This prompted some further investigation for pulmonary embolus.  The patient had a CT angiogram performed which confirmed that the patient did have a pulmonary embolism in the subsegmental arteries at the left lower lobe. There was no evidence of right heart strain.  The patient was then started on Lovenox and Coumadin.  She is currently being titrated with the Coumadin.  The patient, however, developed abdominal wall hematoma with the use of Lovenox and this has been transitioned over to IV heparin drip to bridge the patient until she is therapeutic on Coumadin. The patient also was treated for COPD with  steroids and beta-agonist but she developed wheezing, what appeared to be reactive airway disease. Presently, the patient is markedly improved in terms of her work of breathing.  She is on a prednisone taper and will complete that on September 28, 2010.  The patient is also treated with Avelox which will be completed on September 23, 2010, in 2 days.  In terms of her mood disorder, the patient was seen by Psychiatry who recommended that the patient be seen in the inpatient psychiatry setting for further evaluation and management.  The patient, however, has refused inpatient behavioral health for further evaluation and treatment and is not committable at this time.  The patient has reported that she is having difficulty sleeping and feels anxious.  Per Psychiatry, her Risperdal has been increased to 0.5 mg p.o. b.i.d. and 1 mg q.h.s. and had Ambien is increased to 10 mg p.o. q.h.s. p.r.n..  PHYSICAL EXAMINATION: VITAL SIGNS:  The patient has a temperature of 98.1, heart rate of 107, blood pressure 111/68, respiratory rate of 18, O2 sats are 92% on 4 L. HEENT EXAMINATION:  She is normocephalic, atraumatic.  Pupils equally round and reactive to light and accommodation.  Extraocular movements are intact.  Oropharynx is moist.  No exudate, erythema or lesions are noted. NECK EXAMINATION:  Trachea is midline.  No  masses, no thyromegaly, no JVD, no carotid bruits. LUNGS:  Clear to auscultation with good air entry.  No wheezing or rhonchi noted. CARDIOVASCULAR:  She has got a normal S1 and S2.  No murmurs, rubs or gallops are noted.  She has got mild tachycardia noted. ABDOMINAL EXAMINATION:  The patient has multiple abdominal wall hematomas at the sites where the Lovenox has been injected.  Otherwise, her abdomen is obese, soft, nontender, nondistended.  No hepatosplenomegaly noted. NEUROLOGICAL:  Neurologically, she has no focal neurological deficits. Cranial nerves II through XII are grossly  intact.  DTRs are 2+ bilaterally in the upper and lower extremities. MUSCULOSKELETAL:  She has got no warmth, swelling or erythema around the joints.  No spinal tenderness noted. LYMPH NODE SURVEY:  She has got no cervical, axillary, inguinal lymphadenopathy noted.  PLAN: Due to the hematoma formation, the patient will be transitioned over from subcutaneous Lovenox to heparin drip for bridging while her Coumadin is being titrated.  She will continue on the Coumadin until she is therapeutic with appropriate overlap for pulmonary embolus.  In terms of her respiratory status, the patient continues to require oxygen and this may actually represent chronic need for oxygen for this patient.  In terms of her acute renal failure, this is improving but please note that this patient has had a significant insult to her kidneys as far back as August 26, 2000 when she had a peak creatinine of 9.90.  The patient's creatinine has continued to improve and her creatinine at 1.63 may actually represent new normal creatinine for this patient.  The patient does have some mild decrease in her systolic function with ejection fraction of 45% to 50% and some mildly increased pulmonary artery pressures also.  The patient also has a grade 1 diastolic dysfunction.  All of this may contribute to her hypoxic state and the patient is receiving Lasix 20 mg daily, I will increase that to 40 mg daily to effect some better diuresis on the patient while closely watching her renal function.  The patient is anticipated to be discharged when she is therapeutic on her Coumadin with the appropriate overlap.     Altha Harm, MD     MAM/MEDQ  D:  09/21/2010  T:  09/22/2010  Job:  [id number]  Electronically Signed by Marthann Schiller MD on 10/30/2010 10:08:44 PM

## 2010-11-04 LAB — COMPREHENSIVE METABOLIC PANEL
ALT: 1716 U/L — ABNORMAL HIGH (ref 0–35)
ALT: 1834 U/L — ABNORMAL HIGH (ref 0–35)
ALT: 194 U/L — ABNORMAL HIGH (ref 0–35)
ALT: 643 U/L — ABNORMAL HIGH (ref 0–35)
ALT: 919 U/L — ABNORMAL HIGH (ref 0–35)
AST: 111 U/L — ABNORMAL HIGH (ref 0–37)
AST: 314 U/L — ABNORMAL HIGH (ref 0–37)
AST: 5196 U/L — ABNORMAL HIGH (ref 0–37)
AST: 59 U/L — ABNORMAL HIGH (ref 0–37)
AST: 6858 U/L — ABNORMAL HIGH (ref 0–37)
AST: 888 U/L — ABNORMAL HIGH (ref 0–37)
Albumin: 2.2 g/dL — ABNORMAL LOW (ref 3.5–5.2)
Albumin: 2.3 g/dL — ABNORMAL LOW (ref 3.5–5.2)
Albumin: 2.4 g/dL — ABNORMAL LOW (ref 3.5–5.2)
Albumin: 2.4 g/dL — ABNORMAL LOW (ref 3.5–5.2)
Alkaline Phosphatase: 40 U/L (ref 39–117)
Alkaline Phosphatase: 45 U/L (ref 39–117)
Alkaline Phosphatase: 46 U/L (ref 39–117)
BUN: 73 mg/dL — ABNORMAL HIGH (ref 6–23)
BUN: 91 mg/dL — ABNORMAL HIGH (ref 6–23)
CO2: 16 mEq/L — ABNORMAL LOW (ref 19–32)
CO2: 22 mEq/L (ref 19–32)
CO2: 22 mEq/L (ref 19–32)
Calcium: 6.2 mg/dL — CL (ref 8.4–10.5)
Calcium: 6.7 mg/dL — ABNORMAL LOW (ref 8.4–10.5)
Calcium: 6.8 mg/dL — ABNORMAL LOW (ref 8.4–10.5)
Calcium: 7.3 mg/dL — ABNORMAL LOW (ref 8.4–10.5)
Calcium: 7.4 mg/dL — ABNORMAL LOW (ref 8.4–10.5)
Chloride: 91 mEq/L — ABNORMAL LOW (ref 96–112)
Chloride: 92 mEq/L — ABNORMAL LOW (ref 96–112)
Chloride: 93 mEq/L — ABNORMAL LOW (ref 96–112)
Creatinine, Ser: 9.32 mg/dL — ABNORMAL HIGH (ref 0.4–1.2)
Creatinine, Ser: 9.9 mg/dL — ABNORMAL HIGH (ref 0.4–1.2)
GFR calc Af Amer: 11 mL/min — ABNORMAL LOW (ref >60)
GFR calc Af Amer: 13 mL/min — ABNORMAL LOW (ref >60)
GFR calc Af Amer: 5 mL/min — ABNORMAL LOW (ref >60)
GFR calc Af Amer: 5 mL/min — ABNORMAL LOW (ref >60)
GFR calc Af Amer: 6 mL/min — ABNORMAL LOW (ref >60)
GFR calc Af Amer: 7 mL/min — ABNORMAL LOW (ref >60)
GFR calc non Af Amer: 4 mL/min — ABNORMAL LOW (ref >60)
GFR calc non Af Amer: 5 mL/min — ABNORMAL LOW (ref >60)
GFR calc non Af Amer: 5 mL/min — ABNORMAL LOW (ref >60)
Glucose, Bld: 108 mg/dL — ABNORMAL HIGH (ref 70–99)
Glucose, Bld: 123 mg/dL — ABNORMAL HIGH (ref 70–99)
Glucose, Bld: 154 mg/dL — ABNORMAL HIGH (ref 70–99)
Glucose, Bld: 194 mg/dL — ABNORMAL HIGH (ref 70–99)
Potassium: 3.1 mEq/L — ABNORMAL LOW (ref 3.5–5.1)
Potassium: 4 mEq/L (ref 3.5–5.1)
Potassium: 4.1 mEq/L (ref 3.5–5.1)
Potassium: 4.7 mEq/L (ref 3.5–5.1)
Sodium: 142 mEq/L (ref 135–145)
Sodium: 142 mEq/L (ref 135–145)
Sodium: 143 mEq/L (ref 135–145)
Sodium: 145 mEq/L (ref 135–145)
Sodium: 146 mEq/L — ABNORMAL HIGH (ref 135–145)
Total Bilirubin: 0.4 mg/dL (ref 0.3–1.2)
Total Bilirubin: 0.6 mg/dL (ref 0.3–1.2)
Total Bilirubin: 0.6 mg/dL (ref 0.3–1.2)
Total Protein: 5.2 g/dL — ABNORMAL LOW (ref 6.0–8.3)
Total Protein: 5.3 g/dL — ABNORMAL LOW (ref 6.0–8.3)
Total Protein: 5.6 g/dL — ABNORMAL LOW (ref 6.0–8.3)
Total Protein: 5.6 g/dL — ABNORMAL LOW (ref 6.0–8.3)
Total Protein: 5.9 g/dL — ABNORMAL LOW (ref 6.0–8.3)

## 2010-11-04 LAB — GLUCOSE, CAPILLARY
Glucose-Capillary: 102 mg/dL — ABNORMAL HIGH (ref 70–99)
Glucose-Capillary: 104 mg/dL — ABNORMAL HIGH (ref 70–99)
Glucose-Capillary: 106 mg/dL — ABNORMAL HIGH (ref 70–99)
Glucose-Capillary: 107 mg/dL — ABNORMAL HIGH (ref 70–99)
Glucose-Capillary: 117 mg/dL — ABNORMAL HIGH (ref 70–99)
Glucose-Capillary: 120 mg/dL — ABNORMAL HIGH (ref 70–99)
Glucose-Capillary: 132 mg/dL — ABNORMAL HIGH (ref 70–99)
Glucose-Capillary: 191 mg/dL — ABNORMAL HIGH (ref 70–99)
Glucose-Capillary: 224 mg/dL — ABNORMAL HIGH (ref 70–99)
Glucose-Capillary: 234 mg/dL — ABNORMAL HIGH (ref 70–99)
Glucose-Capillary: 89 mg/dL (ref 70–99)
Glucose-Capillary: 97 mg/dL (ref 70–99)

## 2010-11-04 LAB — HEMOGLOBIN A1C
Hgb A1c MFr Bld: 5.9 % — ABNORMAL HIGH (ref <5.7)
Mean Plasma Glucose: 123 mg/dL — ABNORMAL HIGH (ref <117)

## 2010-11-04 LAB — URINE MICROSCOPIC-ADD ON

## 2010-11-04 LAB — CARDIAC PANEL(CRET KIN+CKTOT+MB+TROPI)
CK, MB: 28.8 ng/mL (ref 0.3–4.0)
Relative Index: 0.8 (ref 0.0–2.5)
Troponin I: 1.53 ng/mL (ref 0.00–0.06)

## 2010-11-04 LAB — HEPATITIS PANEL, ACUTE
HCV Ab: NEGATIVE
Hep A IgM: NEGATIVE
Hep B C IgM: NEGATIVE
Hepatitis B Surface Ag: NEGATIVE

## 2010-11-04 LAB — BLOOD GAS, ARTERIAL
Bicarbonate: 18.8 mEq/L — ABNORMAL LOW (ref 20.0–24.0)
Drawn by: 252031
O2 Content: 40 L/min
O2 Saturation: 92.5 %
Patient temperature: 98.6
TCO2: 20 mmol/L (ref 0–100)
pCO2 arterial: 38.6 mmHg (ref 35.0–45.0)
pH, Arterial: 7.308 — ABNORMAL LOW (ref 7.350–7.400)

## 2010-11-04 LAB — POCT I-STAT 3, ART BLOOD GAS (G3+)
Acid-base deficit: 4 mmol/L — ABNORMAL HIGH (ref 0.0–2.0)
Acid-base deficit: 6 mmol/L — ABNORMAL HIGH (ref 0.0–2.0)
Acid-base deficit: 8 mmol/L — ABNORMAL HIGH (ref 0.0–2.0)
Bicarbonate: 19.8 mEq/L — ABNORMAL LOW (ref 20.0–24.0)
Bicarbonate: 20.1 mEq/L (ref 20.0–24.0)
Bicarbonate: 21 mEq/L (ref 20.0–24.0)
Bicarbonate: 23.9 mEq/L (ref 20.0–24.0)
O2 Saturation: 92 %
O2 Saturation: 94 %
O2 Saturation: 94 %
O2 Saturation: 97 %
O2 Saturation: 99 %
Patient temperature: 98.9
Patient temperature: 99.1
TCO2: 21 mmol/L (ref 0–100)
TCO2: 21 mmol/L (ref 0–100)
TCO2: 21 mmol/L (ref 0–100)
TCO2: 22 mmol/L (ref 0–100)
TCO2: 25 mmol/L (ref 0–100)
TCO2: 26 mmol/L (ref 0–100)
pCO2 arterial: 43.1 mmHg (ref 35.0–45.0)
pCO2 arterial: 48.1 mmHg — ABNORMAL HIGH (ref 35.0–45.0)
pCO2 arterial: 49.4 mmHg — ABNORMAL HIGH (ref 35.0–45.0)
pCO2 arterial: 51.5 mmHg — ABNORMAL HIGH (ref 35.0–45.0)
pCO2 arterial: 53 mmHg — ABNORMAL HIGH (ref 35.0–45.0)
pH, Arterial: 7.247 — ABNORMAL LOW (ref 7.350–7.400)
pH, Arterial: 7.26 — ABNORMAL LOW (ref 7.350–7.400)
pH, Arterial: 7.262 — ABNORMAL LOW (ref 7.350–7.400)
pH, Arterial: 7.298 — ABNORMAL LOW (ref 7.350–7.400)
pH, Arterial: 7.319 — ABNORMAL LOW (ref 7.350–7.400)
pO2, Arterial: 163 mmHg — ABNORMAL HIGH (ref 80.0–100.0)
pO2, Arterial: 55 mmHg — ABNORMAL LOW (ref 80.0–100.0)
pO2, Arterial: 66 mmHg — ABNORMAL LOW (ref 80.0–100.0)
pO2, Arterial: 84 mmHg (ref 80.0–100.0)
pO2, Arterial: 96 mmHg (ref 80.0–100.0)

## 2010-11-04 LAB — DIFFERENTIAL
Basophils Absolute: 0 10*3/uL (ref 0.0–0.1)
Basophils Relative: 0 % (ref 0–1)
Basophils Relative: 0 % (ref 0–1)
Basophils Relative: 0 % (ref 0–1)
Eosinophils Absolute: 0 10*3/uL (ref 0.0–0.7)
Eosinophils Absolute: 0 10*3/uL (ref 0.0–0.7)
Eosinophils Absolute: 0 10*3/uL (ref 0.0–0.7)
Eosinophils Relative: 0 % (ref 0–5)
Eosinophils Relative: 0 % (ref 0–5)
Lymphs Abs: 0.9 10*3/uL (ref 0.7–4.0)
Lymphs Abs: 1.5 10*3/uL (ref 0.7–4.0)
Monocytes Absolute: 0.6 10*3/uL (ref 0.1–1.0)
Monocytes Absolute: 1 10*3/uL (ref 0.1–1.0)
Monocytes Relative: 6 % (ref 3–12)
Monocytes Relative: 8 % (ref 3–12)
Neutro Abs: 9.4 10*3/uL — ABNORMAL HIGH (ref 1.7–7.7)

## 2010-11-04 LAB — CBC
HCT: 32.9 % — ABNORMAL LOW (ref 36.0–46.0)
HCT: 33 % — ABNORMAL LOW (ref 36.0–46.0)
HCT: 33.1 % — ABNORMAL LOW (ref 36.0–46.0)
HCT: 33.2 % — ABNORMAL LOW (ref 36.0–46.0)
HCT: 37.1 % (ref 36.0–46.0)
HCT: 40.4 % (ref 36.0–46.0)
HCT: 46.6 % — ABNORMAL HIGH (ref 36.0–46.0)
Hemoglobin: 11.3 g/dL — ABNORMAL LOW (ref 12.0–15.0)
Hemoglobin: 11.3 g/dL — ABNORMAL LOW (ref 12.0–15.0)
Hemoglobin: 11.5 g/dL — ABNORMAL LOW (ref 12.0–15.0)
Hemoglobin: 15 g/dL (ref 12.0–15.0)
MCH: 31.6 pg (ref 26.0–34.0)
MCH: 31.8 pg (ref 26.0–34.0)
MCH: 32.2 pg (ref 26.0–34.0)
MCH: 32.3 pg (ref 26.0–34.0)
MCHC: 31.7 g/dL (ref 30.0–36.0)
MCHC: 32.2 g/dL (ref 30.0–36.0)
MCHC: 33.4 g/dL (ref 30.0–36.0)
MCHC: 34.5 g/dL (ref 30.0–36.0)
MCHC: 34.6 g/dL (ref 30.0–36.0)
MCV: 92.2 fL (ref 78.0–100.0)
MCV: 93.5 fL (ref 78.0–100.0)
Platelets: 156 10*3/uL (ref 150–400)
Platelets: 218 10*3/uL (ref 150–400)
RBC: 3.58 MIL/uL — ABNORMAL LOW (ref 3.87–5.11)
RDW: 14.7 % (ref 11.5–15.5)
RDW: 14.8 % (ref 11.5–15.5)
RDW: 14.9 % (ref 11.5–15.5)
RDW: 15.1 % (ref 11.5–15.5)
RDW: 15.2 % (ref 11.5–15.5)
WBC: 11.5 10*3/uL — ABNORMAL HIGH (ref 4.0–10.5)
WBC: 8.1 10*3/uL (ref 4.0–10.5)
WBC: 8.9 10*3/uL (ref 4.0–10.5)
WBC: 9 10*3/uL (ref 4.0–10.5)

## 2010-11-04 LAB — CULTURE, RESPIRATORY W GRAM STAIN

## 2010-11-04 LAB — CULTURE, BLOOD (ROUTINE X 2)
Culture  Setup Time: 201112280446
Culture  Setup Time: 201112281037
Culture: NO GROWTH

## 2010-11-04 LAB — BASIC METABOLIC PANEL
BUN: 32 mg/dL — ABNORMAL HIGH (ref 6–23)
BUN: 40 mg/dL — ABNORMAL HIGH (ref 6–23)
BUN: 46 mg/dL — ABNORMAL HIGH (ref 6–23)
BUN: 52 mg/dL — ABNORMAL HIGH (ref 6–23)
BUN: 58 mg/dL — ABNORMAL HIGH (ref 6–23)
CO2: 19 mEq/L (ref 19–32)
CO2: 19 mEq/L (ref 19–32)
CO2: 21 mEq/L (ref 19–32)
CO2: 22 mEq/L (ref 19–32)
CO2: 22 mEq/L (ref 19–32)
Calcium: 6.4 mg/dL — CL (ref 8.4–10.5)
Calcium: 6.6 mg/dL — ABNORMAL LOW (ref 8.4–10.5)
Calcium: 6.7 mg/dL — ABNORMAL LOW (ref 8.4–10.5)
Chloride: 92 mEq/L — ABNORMAL LOW (ref 96–112)
Chloride: 94 mEq/L — ABNORMAL LOW (ref 96–112)
Chloride: 95 mEq/L — ABNORMAL LOW (ref 96–112)
Chloride: 99 mEq/L (ref 96–112)
Creatinine, Ser: 5.44 mg/dL — ABNORMAL HIGH (ref 0.4–1.2)
GFR calc Af Amer: 8 mL/min — ABNORMAL LOW (ref >60)
GFR calc non Af Amer: 5 mL/min — ABNORMAL LOW (ref >60)
GFR calc non Af Amer: 5 mL/min — ABNORMAL LOW (ref >60)
GFR calc non Af Amer: 6 mL/min — ABNORMAL LOW (ref >60)
GFR calc non Af Amer: 6 mL/min — ABNORMAL LOW (ref >60)
GFR calc non Af Amer: 7 mL/min — ABNORMAL LOW (ref >60)
GFR calc non Af Amer: 7 mL/min — ABNORMAL LOW (ref >60)
Glucose, Bld: 103 mg/dL — ABNORMAL HIGH (ref 70–99)
Glucose, Bld: 138 mg/dL — ABNORMAL HIGH (ref 70–99)
Glucose, Bld: 168 mg/dL — ABNORMAL HIGH (ref 70–99)
Glucose, Bld: 179 mg/dL — ABNORMAL HIGH (ref 70–99)
Glucose, Bld: 206 mg/dL — ABNORMAL HIGH (ref 70–99)
Glucose, Bld: 215 mg/dL — ABNORMAL HIGH (ref 70–99)
Glucose, Bld: 220 mg/dL — ABNORMAL HIGH (ref 70–99)
Potassium: 3.1 mEq/L — ABNORMAL LOW (ref 3.5–5.1)
Potassium: 3.1 mEq/L — ABNORMAL LOW (ref 3.5–5.1)
Potassium: 3.3 mEq/L — ABNORMAL LOW (ref 3.5–5.1)
Potassium: 3.4 mEq/L — ABNORMAL LOW (ref 3.5–5.1)
Potassium: 3.5 mEq/L (ref 3.5–5.1)
Potassium: 3.6 mEq/L (ref 3.5–5.1)
Potassium: 4.1 mEq/L (ref 3.5–5.1)
Sodium: 143 mEq/L (ref 135–145)
Sodium: 144 mEq/L (ref 135–145)
Sodium: 145 mEq/L (ref 135–145)
Sodium: 147 mEq/L — ABNORMAL HIGH (ref 135–145)
Sodium: 150 mEq/L — ABNORMAL HIGH (ref 135–145)

## 2010-11-04 LAB — POCT I-STAT, CHEM 8
BUN: 24 mg/dL — ABNORMAL HIGH (ref 6–23)
HCT: 48 % — ABNORMAL HIGH (ref 36.0–46.0)
Hemoglobin: 16.3 g/dL — ABNORMAL HIGH (ref 12.0–15.0)
Sodium: 140 mEq/L (ref 135–145)
TCO2: 29 mmol/L (ref 0–100)

## 2010-11-04 LAB — URINALYSIS, ROUTINE W REFLEX MICROSCOPIC
Glucose, UA: 100 mg/dL — AB
Glucose, UA: NEGATIVE mg/dL
Ketones, ur: 15 mg/dL — AB
Nitrite: POSITIVE — AB
Protein, ur: 100 mg/dL — AB
Urobilinogen, UA: 2 mg/dL — ABNORMAL HIGH (ref 0.0–1.0)
pH: 5 (ref 5.0–8.0)

## 2010-11-04 LAB — HEPATIC FUNCTION PANEL
ALT: 1312 U/L — ABNORMAL HIGH (ref 0–35)
AST: 2166 U/L — ABNORMAL HIGH (ref 0–37)
Alkaline Phosphatase: 47 U/L (ref 39–117)
Bilirubin, Direct: 0.2 mg/dL (ref 0.0–0.3)
Total Bilirubin: 0.7 mg/dL (ref 0.3–1.2)

## 2010-11-04 LAB — PHOSPHORUS
Phosphorus: 5.2 mg/dL — ABNORMAL HIGH (ref 2.3–4.6)
Phosphorus: 7.4 mg/dL — ABNORMAL HIGH (ref 2.3–4.6)
Phosphorus: 7.5 mg/dL — ABNORMAL HIGH (ref 2.3–4.6)

## 2010-11-04 LAB — TROPONIN I: Troponin I: 1.88 ng/mL (ref 0.00–0.06)

## 2010-11-04 LAB — ANTI-SMOOTH MUSCLE ANTIBODY, IGG: F-Actin IgG: <20

## 2010-11-04 LAB — APTT: aPTT: 27 seconds (ref 24–37)

## 2010-11-04 LAB — RAPID URINE DRUG SCREEN, HOSP PERFORMED
Cocaine: NOT DETECTED
Opiates: POSITIVE — AB
Tetrahydrocannabinol: NOT DETECTED

## 2010-11-04 LAB — OSMOLALITY, URINE: Osmolality, Ur: 327 mOsm/kg — ABNORMAL LOW (ref 390–1090)

## 2010-11-04 LAB — POCT CARDIAC MARKERS
CKMB, poc: 29.5 ng/mL (ref 1.0–8.0)
Myoglobin, poc: >500 ng/mL (ref 12–200)

## 2010-11-04 LAB — HEPATITIS B SURFACE ANTIBODY,QUALITATIVE: Hep B S Ab: NEGATIVE

## 2010-11-04 LAB — MAGNESIUM: Magnesium: 1.9 mg/dL (ref 1.5–2.5)

## 2010-11-04 LAB — CALCIUM, IONIZED: Calcium, Ion: 0.75 mmol/L — ABNORMAL LOW (ref 1.12–1.32)

## 2010-11-04 LAB — ANTI-NEUTROPHIL ANTIBODY

## 2010-11-04 LAB — C3 COMPLEMENT: C3 Complement: 95 mg/dL (ref 88–201)

## 2010-11-04 LAB — ALCOHOL, METHYL (METHANOL), BLOOD: Methanol Lvl: NEGATIVE

## 2010-11-04 LAB — PROCALCITONIN: Procalcitonin: 3.42 ng/mL

## 2010-11-04 LAB — AMMONIA: Ammonia: 37 umol/L — ABNORMAL HIGH (ref 11–35)

## 2010-11-04 LAB — ETHYLENE GLYCOL: Ethylene Glycol Lvl: 14.08

## 2010-11-04 LAB — OSMOLALITY: Osmolality: 303 mOsm/kg — ABNORMAL HIGH (ref 275–300)

## 2010-11-28 LAB — CBC
HCT: 42.2 % (ref 36.0–46.0)
HCT: 44.7 % (ref 36.0–46.0)
Hemoglobin: 15.7 g/dL — ABNORMAL HIGH (ref 12.0–15.0)
MCHC: 34.2 g/dL (ref 30.0–36.0)
MCHC: 35.1 g/dL (ref 30.0–36.0)
MCV: 93.1 fL (ref 78.0–100.0)
MCV: 93.2 fL (ref 78.0–100.0)
Platelets: 229 10*3/uL (ref 150–400)
RBC: 4.53 MIL/uL (ref 3.87–5.11)
RBC: 4.8 MIL/uL (ref 3.87–5.11)
RDW: 13.8 % (ref 11.5–15.5)
WBC: 4.2 10*3/uL (ref 4.0–10.5)
WBC: 7.5 K/uL (ref 4.0–10.5)

## 2010-11-28 LAB — CARDIAC PANEL(CRET KIN+CKTOT+MB+TROPI)
CK, MB: 0.8 ng/mL (ref 0.3–4.0)
Relative Index: INVALID (ref 0.0–2.5)
Troponin I: <0.01 ng/mL (ref 0.00–0.06)

## 2010-11-28 LAB — EXPECTORATED SPUTUM ASSESSMENT W GRAM STAIN, RFLX TO RESP C

## 2010-11-28 LAB — BASIC METABOLIC PANEL
BUN: 13 mg/dL (ref 6–23)
BUN: 15 mg/dL (ref 6–23)
CO2: 23 mEq/L (ref 19–32)
CO2: 24 mEq/L (ref 19–32)
Calcium: 8.6 mg/dL (ref 8.4–10.5)
Chloride: 104 mEq/L (ref 96–112)
Chloride: 105 mEq/L (ref 96–112)
Creatinine, Ser: 1.07 mg/dL (ref 0.4–1.2)
Creatinine, Ser: 1.15 mg/dL (ref 0.4–1.2)
GFR calc Af Amer: >60 mL/min (ref >60)
Potassium: 3.5 mEq/L (ref 3.5–5.1)

## 2010-11-28 LAB — POCT CARDIAC MARKERS
CKMB, poc: <1 ng/mL — ABNORMAL LOW (ref 1.0–8.0)
Myoglobin, poc: 76.3 ng/mL (ref 12–200)
Troponin i, poc: <0.05 ng/mL (ref 0.00–0.09)

## 2010-11-28 LAB — HEPATITIS PANEL, ACUTE
HCV Ab: NEGATIVE
Hep B C IgM: NEGATIVE
Hepatitis B Surface Ag: NEGATIVE

## 2010-11-28 LAB — COMPREHENSIVE METABOLIC PANEL WITH GFR
ALT: 40 U/L — ABNORMAL HIGH (ref 0–35)
AST: 46 U/L — ABNORMAL HIGH (ref 0–37)
CO2: 26 meq/L (ref 19–32)
Calcium: 9.3 mg/dL (ref 8.4–10.5)
Chloride: 97 meq/L (ref 96–112)
GFR calc Af Amer: >60 mL/min (ref >60)
GFR calc non Af Amer: 50 mL/min — ABNORMAL LOW (ref >60)
Sodium: 135 meq/L (ref 135–145)

## 2010-11-28 LAB — CK TOTAL AND CKMB (NOT AT ARMC)
CK, MB: 0.6 ng/mL (ref 0.3–4.0)
Relative Index: INVALID (ref 0.0–2.5)
Total CK: 82 U/L (ref 7–177)

## 2010-11-28 LAB — T4, FREE: Free T4: 1.36 ng/dL (ref 0.80–1.80)

## 2010-11-28 LAB — URINALYSIS, ROUTINE W REFLEX MICROSCOPIC
Bilirubin Urine: NEGATIVE
Glucose, UA: NEGATIVE mg/dL
Hgb urine dipstick: NEGATIVE
Ketones, ur: NEGATIVE mg/dL
Nitrite: NEGATIVE
Protein, ur: NEGATIVE mg/dL
Specific Gravity, Urine: 1.014 (ref 1.005–1.030)
Urobilinogen, UA: 0.2 mg/dL (ref 0.0–1.0)
pH: 6 (ref 5.0–8.0)

## 2010-11-28 LAB — COMPREHENSIVE METABOLIC PANEL
Albumin: 4 g/dL (ref 3.5–5.2)
Alkaline Phosphatase: 101 U/L (ref 39–117)
BUN: 12 mg/dL (ref 6–23)
Creatinine, Ser: 1.17 mg/dL (ref 0.4–1.2)
Glucose, Bld: 121 mg/dL — ABNORMAL HIGH (ref 70–99)
Potassium: 2.8 mEq/L — ABNORMAL LOW (ref 3.5–5.1)
Total Bilirubin: 0.5 mg/dL (ref 0.3–1.2)
Total Protein: 8 g/dL (ref 6.0–8.3)

## 2010-11-28 LAB — CULTURE, RESPIRATORY W GRAM STAIN: Culture: NORMAL

## 2010-11-28 LAB — DIFFERENTIAL
Basophils Absolute: 0.1 10*3/uL (ref 0.0–0.1)
Basophils Relative: 1 % (ref 0–1)
Eosinophils Absolute: 0.3 K/uL (ref 0.0–0.7)
Eosinophils Relative: 4 % (ref 0–5)
Lymphocytes Relative: 35 % (ref 12–46)
Lymphs Abs: 2.6 K/uL (ref 0.7–4.0)
Monocytes Absolute: 0.6 10*3/uL (ref 0.1–1.0)
Monocytes Relative: 8 % (ref 3–12)
Neutro Abs: 3.9 10*3/uL (ref 1.7–7.7)
Neutrophils Relative %: 52 % (ref 43–77)

## 2010-11-28 LAB — TSH: TSH: 2.947 u[IU]/mL (ref 0.350–4.500)

## 2010-11-28 LAB — RAPID URINE DRUG SCREEN, HOSP PERFORMED
Amphetamines: NOT DETECTED
Barbiturates: POSITIVE — AB
Benzodiazepines: POSITIVE — AB
Cocaine: NOT DETECTED
Opiates: NOT DETECTED
Tetrahydrocannabinol: NOT DETECTED

## 2010-11-28 LAB — TROPONIN I: Troponin I: 0.02 ng/mL (ref 0.00–0.06)

## 2010-11-28 LAB — LIPID PANEL
HDL: 43 mg/dL (ref >39)
Total CHOL/HDL Ratio: 3.9 RATIO
VLDL: 35 mg/dL (ref 0–40)

## 2010-11-28 LAB — PHOSPHORUS: Phosphorus: 3.9 mg/dL (ref 2.3–4.6)

## 2010-11-28 LAB — D-DIMER, QUANTITATIVE: D-Dimer, Quant: 0.56 ug{FEU}/mL — ABNORMAL HIGH (ref 0.00–0.48)

## 2010-11-28 LAB — BRAIN NATRIURETIC PEPTIDE: Pro B Natriuretic peptide (BNP): <30 pg/mL (ref 0.0–100.0)

## 2010-11-28 LAB — MAGNESIUM: Magnesium: 1.8 mg/dL (ref 1.5–2.5)

## 2010-11-28 LAB — HIV ANTIBODY (ROUTINE TESTING W REFLEX): HIV: NONREACTIVE

## 2010-11-28 LAB — PREGNANCY, URINE: Preg Test, Ur: NEGATIVE

## 2011-01-10 NOTE — Cardiovascular Report (Signed)
   NAME:  WESTLYN, GLAZA                          ACCOUNT NO.:  [id number]   MEDICAL RECORD NO.:  [id number]                   PATIENT TYPE:  OIB   LOCATION:  2854                                 FACILITY:  [facility]   PHYSICIAN:  Salvadore Farber, M.D.             DATE OF BIRTH:  [birth year]   DATE OF PROCEDURE:  04/21/2003  DATE OF DISCHARGE:                              CARDIAC CATHETERIZATION   PROCEDURE:  Left heart catheterization, left ventriculography, coronary  angiography.   INDICATIONS:  Ms. Cavan is a 50 year old lady with risk factors of family  history of premature atherosclerotic disease, dyslipidemia, and ongoing  tobacco abuse.  She presents with chest pain occurring with both exertion  and emotional upset.  She is referred for diagnostic angiography by Maisie Fus  C. Wall, M.D.   PROCEDURAL TECHNIQUE:  Informed consent was obtained.  Under 1% lidocaine  local anesthesia a 6-French sheath was placed in the right femoral artery  using the modified Seldinger technique.  Diagnostic angiography and  ventriculography were performed using JL4, JR4, and pigtail catheters.  The  patient tolerated the procedure well and was transferred to the holding room  in stable condition.  Sheaths will be removed there.   COMPLICATIONS:  None.   FINDINGS:  1. Left main:  Angiographically normal.  2. LAD:  Moderate sized vessel giving rise to two small diagonal branches.     It is angiographically normal.  3. Circumflex:  Large, codominant vessel giving rise to two obtuse marginals     and a PDA.  It is angiographically normal.  4. RCA:  Moderate sized codominant vessel which is angiographically normal.  5. No aortic stenosis or mitral regurgitation.   IMPRESSION/RECOMMENDATIONS:  The patient has normal coronary arteries as  well as normal left ventricular size and systolic function.  Suspect  noncardiac etiology to her chest discomfort.     Salvadore Farber, M.D.    WED/MEDQ  D:  04/21/2003  T:  04/21/2003  Job:  [id number]   cc:   Aida Puffer  [address]  [place]  [place] [address]  Fax: [phone]   Jesse Sans. Wall, M.D.

## 2011-03-24 ENCOUNTER — Other Ambulatory Visit (HOSPITAL_BASED_OUTPATIENT_CLINIC_OR_DEPARTMENT_OTHER): Payer: Self-pay | Admitting: Family

## 2011-03-24 DIAGNOSIS — Z1231 Encounter for screening mammogram for malignant neoplasm of breast: Secondary | ICD-10-CM

## 2011-03-31 ENCOUNTER — Ambulatory Visit (HOSPITAL_BASED_OUTPATIENT_CLINIC_OR_DEPARTMENT_OTHER): Payer: Medicare Other

## 2011-04-16 ENCOUNTER — Ambulatory Visit (HOSPITAL_BASED_OUTPATIENT_CLINIC_OR_DEPARTMENT_OTHER): Payer: Medicare Other

## 2011-04-24 ENCOUNTER — Ambulatory Visit (HOSPITAL_BASED_OUTPATIENT_CLINIC_OR_DEPARTMENT_OTHER): Payer: Medicare Other

## 2011-04-30 ENCOUNTER — Ambulatory Visit (HOSPITAL_BASED_OUTPATIENT_CLINIC_OR_DEPARTMENT_OTHER): Payer: Medicare Other

## 2011-05-06 ENCOUNTER — Ambulatory Visit (HOSPITAL_BASED_OUTPATIENT_CLINIC_OR_DEPARTMENT_OTHER)
Admission: RE | Admit: 2011-05-06 | Discharge: 2011-05-06 | Disposition: A | Discharge status: Home / Self Care | Payer: Medicare Other | Source: Ambulatory Visit | Attending: Family | Admitting: Family

## 2011-05-06 DIAGNOSIS — Z1231 Encounter for screening mammogram for malignant neoplasm of breast: Secondary | ICD-10-CM | POA: Insufficient documentation

## 2011-05-14 ENCOUNTER — Other Ambulatory Visit: Payer: Self-pay | Admitting: Family

## 2011-05-14 DIAGNOSIS — R928 Other abnormal and inconclusive findings on diagnostic imaging of breast: Secondary | ICD-10-CM

## 2011-05-19 ENCOUNTER — Ambulatory Visit
Admission: RE | Admit: 2011-05-19 | Discharge: 2011-05-19 | Disposition: A | Discharge status: Home / Self Care | Payer: Medicare Other | Source: Ambulatory Visit | Attending: Family | Admitting: Family

## 2011-05-19 DIAGNOSIS — R928 Other abnormal and inconclusive findings on diagnostic imaging of breast: Secondary | ICD-10-CM

## 2012-07-05 ENCOUNTER — Ambulatory Visit (HOSPITAL_COMMUNITY)
Admission: RE | Admit: 2012-07-05 | Discharge: 2012-07-05 | Disposition: A | Discharge status: Home / Self Care | Payer: Medicare Other | Source: Ambulatory Visit | Attending: Internal Medicine | Admitting: Internal Medicine

## 2012-07-05 DIAGNOSIS — G4730 Sleep apnea, unspecified: Secondary | ICD-10-CM

## 2012-07-06 NOTE — Procedures (Signed)
EEG NUMBER:  REFERRING PHYSICIAN:  Dr. Levy Pupa.  HISTORY:  A 51 year old female with new onset seizures.  MEDICATIONS:  Cefepime, phenytoin, levetiracetam, lorazepam, potassium, pantoprazole, dexamethasone, diltiazem, enoxaparin, benzonatate, niacin, trazodone.  CONDITIONS OF RECORDING:  This is a 16-channel EEG carried out with the patient in the poorly responsive state.  DESCRIPTION:  The majority of the tracing is recorded during sleep. Normal sleep transients are noted with symmetrical sleep spindles and vertex central transients seen superimposed on a slow background.  The patient is aroused verbally during the tracing.  With arousal, there is activation of the background rhythm.  The patient achieves a maximum posterior background rhythm of 7 Hz theta.  Theta activity is distributed along with delta activity in the central and temporal regions as well.  There is a pronounced lack of alpha rhythms. Hyperventilation and intermittent photic stimulation were not performed.  IMPRESSION:  No epileptiform activity is noted.  There is some slowing noted during wakefulness which maybe consistent with drowse since the patient does spend a good portion of the tracing in normal stage II sleep.          ______________________________ Thana Farr, MD    NW:GNFA D:  07/06/2012 09:05:55  T:  07/06/2012 22:42:16  Job #:  [id number]

## 2012-11-09 ENCOUNTER — Emergency Department (HOSPITAL_COMMUNITY): Payer: Medicare Other

## 2012-11-09 ENCOUNTER — Encounter (HOSPITAL_COMMUNITY): Payer: Self-pay | Admitting: Radiology

## 2012-11-09 ENCOUNTER — Emergency Department (HOSPITAL_COMMUNITY)
Admission: EM | Admit: 2012-11-09 | Discharge: 2012-11-09 | Disposition: A | Discharge status: Home / Self Care | Payer: Medicare Other | Attending: Emergency Medicine | Admitting: Emergency Medicine

## 2012-11-09 DIAGNOSIS — R630 Anorexia: Secondary | ICD-10-CM | POA: Insufficient documentation

## 2012-11-09 DIAGNOSIS — R059 Cough, unspecified: Secondary | ICD-10-CM | POA: Insufficient documentation

## 2012-11-09 DIAGNOSIS — R197 Diarrhea, unspecified: Secondary | ICD-10-CM | POA: Insufficient documentation

## 2012-11-09 DIAGNOSIS — R509 Fever, unspecified: Secondary | ICD-10-CM | POA: Insufficient documentation

## 2012-11-09 DIAGNOSIS — R358 Other polyuria: Secondary | ICD-10-CM | POA: Insufficient documentation

## 2012-11-09 DIAGNOSIS — R3589 Other polyuria: Secondary | ICD-10-CM | POA: Insufficient documentation

## 2012-11-09 DIAGNOSIS — R1110 Vomiting, unspecified: Secondary | ICD-10-CM

## 2012-11-09 DIAGNOSIS — R631 Polydipsia: Secondary | ICD-10-CM | POA: Insufficient documentation

## 2012-11-09 DIAGNOSIS — E860 Dehydration: Secondary | ICD-10-CM | POA: Insufficient documentation

## 2012-11-09 DIAGNOSIS — R112 Nausea with vomiting, unspecified: Secondary | ICD-10-CM | POA: Insufficient documentation

## 2012-11-09 DIAGNOSIS — R05 Cough: Secondary | ICD-10-CM | POA: Insufficient documentation

## 2012-11-09 HISTORY — DX: Unspecified osteoarthritis, unspecified site: M19.90

## 2012-11-09 HISTORY — DX: Depression, unspecified: F32.A

## 2012-11-09 HISTORY — DX: Unspecified convulsions: R56.9

## 2012-11-09 HISTORY — DX: Emphysema, unspecified: J43.9

## 2012-11-09 HISTORY — DX: Encounter for other specified aftercare: Z51.89

## 2012-11-09 HISTORY — DX: Major depressive disorder, single episode, unspecified: F32.9

## 2012-11-09 HISTORY — DX: Disorder of thyroid, unspecified: E07.9

## 2012-11-09 HISTORY — DX: Chronic obstructive pulmonary disease, unspecified: J44.9

## 2012-11-09 HISTORY — DX: Right heart failure, unspecified: I50.810

## 2012-11-09 LAB — URINE MICROSCOPIC-ADD ON

## 2012-11-09 LAB — CBC WITH DIFFERENTIAL/PLATELET
Basophils Relative: 1 % (ref 0–1)
Eosinophils Relative: 4 % (ref 0–5)
HCT: 36.8 % (ref 36.0–46.0)
Hemoglobin: 12.7 g/dL (ref 12.0–15.0)
MCHC: 34.5 g/dL (ref 30.0–36.0)
MCV: 96.6 fL (ref 78.0–100.0)
Monocytes Absolute: 1 10*3/uL (ref 0.1–1.0)
Monocytes Relative: 10 % (ref 3–12)
Neutro Abs: 6.8 10*3/uL (ref 1.7–7.7)

## 2012-11-09 LAB — CG4 I-STAT (LACTIC ACID): Lactic Acid, Venous: 2.92 mmol/L — ABNORMAL HIGH (ref 0.5–2.2)

## 2012-11-09 LAB — COMPREHENSIVE METABOLIC PANEL
ALT: 10 U/L (ref 0–35)
AST: 20 U/L (ref 0–37)
Alkaline Phosphatase: 131 U/L — ABNORMAL HIGH (ref 39–117)
CO2: 23 mEq/L (ref 19–32)
Calcium: 9.4 mg/dL (ref 8.4–10.5)
Chloride: 98 mEq/L (ref 96–112)
Total Protein: 6.5 g/dL (ref 6.0–8.3)

## 2012-11-09 LAB — URINALYSIS, ROUTINE W REFLEX MICROSCOPIC
Bilirubin Urine: NEGATIVE
Glucose, UA: NEGATIVE mg/dL
Hgb urine dipstick: NEGATIVE
Specific Gravity, Urine: 1.018 (ref 1.005–1.030)
pH: 6 (ref 5.0–8.0)

## 2012-11-09 MED ORDER — SODIUM CHLORIDE 0.9 % IV BOLUS (SEPSIS)
1000.0000 mL | Freq: Once | INTRAVENOUS | Status: AC
  Administered 2012-11-09: 1000 mL via INTRAVENOUS

## 2012-11-09 MED ORDER — SODIUM CHLORIDE 0.9 % IV BOLUS (SEPSIS)
500.0000 mL | Freq: Once | INTRAVENOUS | Status: AC
  Administered 2012-11-09: 500 mL via INTRAVENOUS

## 2012-11-09 MED ORDER — IOHEXOL 300 MG/ML  SOLN
100.0000 mL | Freq: Once | INTRAMUSCULAR | Status: AC | PRN
  Administered 2012-11-09: 100 mL via INTRAVENOUS

## 2012-11-09 MED ORDER — ONDANSETRON HCL 4 MG/2ML IJ SOLN
4.0000 mg | Freq: Once | INTRAMUSCULAR | Status: AC
  Administered 2012-11-09: 4 mg via INTRAVENOUS
  Filled 2012-11-09: qty 2

## 2012-11-09 MED ORDER — MORPHINE SULFATE 4 MG/ML IJ SOLN
4.0000 mg | Freq: Once | INTRAMUSCULAR | Status: AC
  Administered 2012-11-09: 4 mg via INTRAVENOUS
  Filled 2012-11-09: qty 1

## 2012-11-09 MED ORDER — ONDANSETRON HCL 4 MG PO TABS: 4.0000 mg | ORAL_TABLET | Freq: Four times a day (QID) | ORAL | Status: DC

## 2012-11-09 MED ORDER — IOHEXOL 300 MG/ML  SOLN
50.0000 mL | Freq: Once | INTRAMUSCULAR | Status: AC | PRN
  Administered 2012-11-09: 50 mL via ORAL

## 2012-11-09 NOTE — ED Provider Notes (Signed)
Onica Davidovich S  8:00 PM  Pt discussed in sign out with Dr. Anitra Lauth.  Pt with N/V.  Signs of dehydration receiving IV fluids.  Otherwise normal labs and CT.  Anticipate d/c home after PO challenge.  9:00 PM patient has been tolerating by mouth fluids. Get rid the seat additional IV fluids and is feeling some improvements. She still has some generalized weakness complaints but no other complaints. Nausea has improved. At this time she is ready to return home. He is advised continue fluids and supplemental nourishment with Ensure , boost or some other caloric beverage of her choosing. She will plan to call PCP tomorrow.  Angus Seller, PA-C 11/09/12 2106

## 2012-11-09 NOTE — ED Notes (Signed)
Patient transported to X-ray 

## 2012-11-09 NOTE — ED Notes (Signed)
Pt presents with LUQ abd pain with nausea X 1 episode of diarrhea and weakness.

## 2012-11-09 NOTE — Discharge Instructions (Signed)
Dehydration, Adult Dehydration means your body does not have as much fluid as it needs. Your kidneys, brain, and heart will not work properly without the right amount of fluids and salt.  HOME CARE  Ask your doctor how to replace body fluid losses (rehydrate).  Drink enough fluids to keep your pee (urine) clear or pale yellow.  Drink small amounts of fluids often if you feel sick to your stomach (nauseous) or throw up (vomit).  Eat like you normally do.  Avoid:  Foods or drinks high in sugar.  Bubbly (carbonated) drinks.  Juice.  Very hot or cold fluids.  Drinks with caffeine.  Fatty, greasy foods.  Alcohol.  Tobacco.  Eating too much.  Gelatin desserts.  Wash your hands to avoid spreading germs (bacteria, viruses).  Only take medicine as told by your doctor.  Keep all doctor visits as told. GET HELP RIGHT AWAY IF:   You cannot drink something without throwing up.  You get worse even with treatment.  Your vomit has blood in it or looks greenish.  Your poop (stool) has blood in it or looks black and tarry.  You have not peed in 6 to 8 hours.  You pee a small amount of very dark pee.  You have a fever.  You pass out (faint).  You have belly (abdominal) pain that gets worse or stays in one spot (localizes).  You have a rash, stiff neck, or bad headache.  You get easily annoyed, sleepy, or are hard to wake up.  You feel weak, dizzy, or very thirsty. MAKE SURE YOU:   Understand these instructions.  Will watch your condition.  Will get help right away if you are not doing well or get worse. Document Released: 06/07/2009 Document Revised: 11/03/2011 Document Reviewed: 03/31/2011 [employer] Patient Information 2013 [employer].  

## 2012-11-09 NOTE — ED Notes (Signed)
Pt states she has been having lower abd pain and upper left quadrant abd pain. Pt states she was taking BC powder for the pain. I asked the pt does she eat with the [facility] powder she states she doesn't. Pt has hx of acid reflux.

## 2012-11-09 NOTE — ED Notes (Signed)
Pt returned from xray

## 2012-11-09 NOTE — ED Provider Notes (Addendum)
History     CSN: [id number]  Arrival date & time 11/09/12  1431   First MD Initiated Contact with Patient 11/09/12 1508      Chief Complaint  Patient presents with  . Abdominal Pain    (Consider location/radiation/quality/duration/timing/severity/associated sxs/prior treatment) Patient is a 52 y.o. female presenting with abdominal pain. The history is provided by the patient.  Abdominal Pain Pain location:  LUQ, suprapubic and LLQ Pain quality: aching, gnawing, sharp and stabbing   Pain radiates to:  Does not radiate Pain severity:  Severe Onset quality:  Gradual Duration:  2 weeks Timing:  Constant Progression:  Worsening Chronicity:  New Context: not alcohol use, not medication withdrawal, not previous surgeries, not recent illness, not sick contacts and not suspicious food intake   Relieved by:  Nothing Worsened by:  Eating Associated symptoms: anorexia, cough, diarrhea, fever, nausea and vomiting   Associated symptoms: no dysuria, no shortness of breath and no vaginal discharge   Associated symptoms comment:  Only one episode of diarrhea last week Risk factors: no alcohol abuse, no aspirin use, has not had multiple surgeries and no NSAID use     History reviewed. No pertinent past medical history.  History reviewed. No pertinent past surgical history.  History reviewed. No pertinent family history.  History  Substance Use Topics  . Smoking status: Not on file  . Smokeless tobacco: Not on file  . Alcohol Use: Not on file    OB History   Grav Para Term Preterm Abortions TAB SAB Ect Mult Living                  Review of Systems  Constitutional: Positive for fever.       States she thinks she had a fever last week  Respiratory: Positive for cough. Negative for shortness of breath.   Gastrointestinal: Positive for nausea, vomiting, abdominal pain, diarrhea and anorexia.  Endocrine: Positive for polydipsia and polyuria.  Genitourinary: Negative for dysuria  and vaginal discharge.  All other systems reviewed and are negative.    Allergies  Review of patient's allergies indicates no active allergies.  Home Medications  No current outpatient prescriptions on file.  BP 114/77  Pulse 96  Temp(Src) 98.7 F (37.1 C) (Oral)  Resp 18  SpO2 97%  Physical Exam  Nursing note and vitals reviewed. Constitutional: She is oriented to person, place, and time. She appears well-developed and well-nourished. She appears distressed.  HENT:  Head: Normocephalic and atraumatic.  Mouth/Throat: Oropharynx is clear and moist. Mucous membranes are dry.  Eyes: Conjunctivae and EOM are normal. Pupils are equal, round, and reactive to light.  Neck: Normal range of motion. Neck supple.  Cardiovascular: Normal rate, regular rhythm and intact distal pulses.   No murmur heard. Pulmonary/Chest: Effort normal and breath sounds normal. No respiratory distress. She has no wheezes. She has no rales.  Abdominal: Soft. Normal appearance. She exhibits no distension. There is tenderness in the epigastric area, suprapubic area, left upper quadrant and left lower quadrant. There is no rebound, no guarding and no CVA tenderness.  Musculoskeletal: Normal range of motion. She exhibits no edema and no tenderness.  Neurological: She is alert and oriented to person, place, and time.  Skin: Skin is warm and dry. No rash noted. No erythema. There is pallor.  Small healing lesions over the upper and lower ext  Psychiatric: She has a normal mood and affect. Her behavior is normal.    ED Course  Procedures (including critical  care time)  Labs Reviewed  CBC WITH DIFFERENTIAL - Abnormal; Notable for the following:    RBC 3.81 (*)    All other components within normal limits  COMPREHENSIVE METABOLIC PANEL - Abnormal; Notable for the following:    Glucose, Bld 113 (*)    Creatinine, Ser 1.26 (*)    Albumin 2.7 (*)    Alkaline Phosphatase 131 (*)    Total Bilirubin 0.2 (*)    GFR  calc non Af Amer 49 (*)    GFR calc Af Amer 57 (*)    All other components within normal limits  URINALYSIS, ROUTINE W REFLEX MICROSCOPIC - Abnormal; Notable for the following:    APPearance HAZY (*)    Leukocytes, UA TRACE (*)    All other components within normal limits  URINE MICROSCOPIC-ADD ON - Abnormal; Notable for the following:    Squamous Epithelial / LPF MANY (*)    Bacteria, UA FEW (*)    Casts HYALINE CASTS (*)    Crystals CA OXALATE CRYSTALS (*)    All other components within normal limits  CG4 I-STAT (LACTIC ACID) - Abnormal; Notable for the following:    Lactic Acid, Venous 2.92 (*)    All other components within normal limits  LIPASE, BLOOD   Ct Abdomen Pelvis W Contrast  11/09/2012  *RADIOLOGY REPORT*  Clinical Data: Diffuse abdominal pain.  Nausea and vomiting. Generalized weakness.  CT ABDOMEN AND PELVIS WITH CONTRAST  Technique:  Multidetector CT imaging of the abdomen and pelvis was performed following the standard protocol during bolus administration of intravenous contrast.  Contrast: OMNIPAQUE IOHEXOL 300 MG/ML. Oral contrast was also administered.  Comparison: CT abdomen pelvis 06/10/2012 [facility].  Findings: Focal fatty infiltration of the liver adjacent to the falciform ligament as noted previously; no significant abnormalities involving the liver.  Normal-appearing spleen with a focus of accessory splenic tissue medial to the anterior spleen at the hilum.  Normal pancreas and adrenal glands.  Cortical cysts involving both kidneys, the largest arising from the upper pole of the right kidney measuring approximately 3.1 x 2.0 cm, unchanged; no significant abnormalities involving either kidney.  Moderate aorto-iliofemoral atherosclerosis without aneurysm.  No significant lymphadenopathy.  Normal-appearing stomach and small bowel.  Mild wall thickening involving the ascending colon, though there is fat within the mildly thickened colonic wall.  Remainder of  the colon unremarkable apart from scattered sigmoid colon diverticula.  No ascites.  Urinary bladder unremarkable.  Uterus surgically absent.  No adnexal masses or free pelvic fluid.  Phleboliths low in the pelvis.  Bone window images demonstrate mild degenerative changes involving the lower thoracic and lumbar spine.  Emphysematous changes and interstitial fibrosis is present in the lung bases, with expected dependent atelectasis posteriorly; these fibrotic changes have markedly progressed since the prior examination.  IMPRESSION:  1.  No acute abnormalities involving the abdomen or pelvis. 2.  Likely chronic inflammatory changes involving the cecum and ascending colon, as there is fat within the wall of this segment of colon.  Scattered sigmoid diverticulosis without evidence of acute diverticulitis. 3.  Focal hepatic steatosis adjacent to the falciform ligament.  No significant abnormalities involving the liver. 4.  Bilateral renal cysts. 5.  Moderate aorto-iliofemoral atherosclerosis which is advanced for age. 6.  Emphysematous changes in the lung bases and interstitial pulmonary fibrosis, with marked progression of the fibrotic changes since October, 2013.   Original Report Authenticated By: Hulan Saas, M.D.    Dg Abd Acute W/chest  11/09/2012  *  RADIOLOGY REPORT*  Clinical Data: Chest and abdomen pain.  ACUTE ABDOMEN SERIES (ABDOMEN 2 VIEW & CHEST 1 VIEW)  Comparison: Previous examinations.  Findings: Normal sized heart.  Small amount of linear scarring at the right lung base.  Mildly prominent interstitial markings with normal vascularity.  Nonobstructive bowel gas pattern without free peritoneal air.  Cholecystectomy clips.  Mild scoliosis.  IMPRESSION:  1.  No acute abnormality. 2.  Mild chronic interstitial lung disease.   Original Report Authenticated By: Beckie Salts, M.D.      1. Dehydration   2. Vomiting       MDM   Patient here with worsening abdominal pain over the last one to 2  weeks. She states she is vomiting constantly and not holding down any fluids. She only had one episode of loose stool no further diarrhea. She denies any dysuria.  She denies any prior history of abdominal pain. She is not an alcohol abuser and does not use NSAIDs regularly.  Patient appears extremely dehydrated on exam and is having left upper quadrant and lower abdominal pain. Concern for pancreatitis versus diverticulitis. The patient is status post cholecystectomy. And low suspicion for appendicitis at this time. CBC, CMP, lipase, lactic acid, UA pending. Patient given IV fluids and pain and nausea medication.  7:51 PM Labs are relatively unremarkable except for a lactic acid of 2.9. UA without signs of infection and CT of the abdomen and pelvis negative for acute pathology. Patient is feeling better after IV fluids and husband states she was able to tolerate by mouth so last 2 days but vomits in the morning when she tries to take all of her medications. Patient also been disclosed that she was recently placed on Levaquin for possible bronchitis which she thinks triggered the vomiting. She is not currently taking that medication any longer.  7:51 PM Will po challenge pt and if no further vomiting will d/c home.   Gwyneth Sprout, MD 11/09/12 1952  Gwyneth Sprout, MD 11/09/12 4098

## 2012-11-11 NOTE — ED Provider Notes (Signed)
Medical screening examination/treatment/procedure(s) were performed by non-physician practitioner and as supervising physician I was immediately available for consultation/collaboration.  Kanani Mowbray, MD 11/11/12 1356 

## 2013-01-22 ENCOUNTER — Encounter (HOSPITAL_COMMUNITY): Payer: Self-pay | Admitting: Family Medicine

## 2013-01-22 ENCOUNTER — Other Ambulatory Visit: Payer: Self-pay

## 2013-01-22 ENCOUNTER — Emergency Department (HOSPITAL_COMMUNITY): Payer: Medicare Other

## 2013-01-22 ENCOUNTER — Emergency Department (HOSPITAL_COMMUNITY)
Admission: EM | Admit: 2013-01-22 | Discharge: 2013-01-22 | Disposition: A | Discharge status: Home / Self Care | Payer: Medicare Other | Attending: Emergency Medicine | Admitting: Emergency Medicine

## 2013-01-22 DIAGNOSIS — I509 Heart failure, unspecified: Secondary | ICD-10-CM | POA: Insufficient documentation

## 2013-01-22 DIAGNOSIS — J438 Other emphysema: Secondary | ICD-10-CM | POA: Insufficient documentation

## 2013-01-22 DIAGNOSIS — Y92009 Unspecified place in unspecified non-institutional (private) residence as the place of occurrence of the external cause: Secondary | ICD-10-CM | POA: Insufficient documentation

## 2013-01-22 DIAGNOSIS — Y998 Other external cause status: Secondary | ICD-10-CM | POA: Insufficient documentation

## 2013-01-22 DIAGNOSIS — M129 Arthropathy, unspecified: Secondary | ICD-10-CM | POA: Insufficient documentation

## 2013-01-22 DIAGNOSIS — M25559 Pain in unspecified hip: Secondary | ICD-10-CM | POA: Insufficient documentation

## 2013-01-22 DIAGNOSIS — F329 Major depressive disorder, single episode, unspecified: Secondary | ICD-10-CM | POA: Insufficient documentation

## 2013-01-22 DIAGNOSIS — S7002XA Contusion of left hip, initial encounter: Secondary | ICD-10-CM

## 2013-01-22 DIAGNOSIS — S7000XA Contusion of unspecified hip, initial encounter: Secondary | ICD-10-CM | POA: Insufficient documentation

## 2013-01-22 DIAGNOSIS — E079 Disorder of thyroid, unspecified: Secondary | ICD-10-CM | POA: Insufficient documentation

## 2013-01-22 DIAGNOSIS — R569 Unspecified convulsions: Secondary | ICD-10-CM | POA: Insufficient documentation

## 2013-01-22 DIAGNOSIS — R296 Repeated falls: Secondary | ICD-10-CM | POA: Insufficient documentation

## 2013-01-22 DIAGNOSIS — R262 Difficulty in walking, not elsewhere classified: Secondary | ICD-10-CM | POA: Insufficient documentation

## 2013-01-22 DIAGNOSIS — F3289 Other specified depressive episodes: Secondary | ICD-10-CM | POA: Insufficient documentation

## 2013-01-22 DIAGNOSIS — S7010XA Contusion of unspecified thigh, initial encounter: Secondary | ICD-10-CM | POA: Insufficient documentation

## 2013-01-22 DIAGNOSIS — Z5189 Encounter for other specified aftercare: Secondary | ICD-10-CM | POA: Insufficient documentation

## 2013-01-22 DIAGNOSIS — Z79899 Other long term (current) drug therapy: Secondary | ICD-10-CM | POA: Insufficient documentation

## 2013-01-22 LAB — TROPONIN I: Troponin I: <0.3 ng/mL (ref <0.30)

## 2013-01-22 LAB — TYPE AND SCREEN
ABO/RH(D): O POS
Antibody Screen: NEGATIVE

## 2013-01-22 LAB — BASIC METABOLIC PANEL
BUN: 8 mg/dL (ref 6–23)
CO2: 24 mEq/L (ref 19–32)
Chloride: 102 mEq/L (ref 96–112)
Creatinine, Ser: 0.94 mg/dL (ref 0.50–1.10)
Glucose, Bld: 113 mg/dL — ABNORMAL HIGH (ref 70–99)

## 2013-01-22 LAB — CBC WITH DIFFERENTIAL/PLATELET
Eosinophils Relative: 1 % (ref 0–5)
HCT: 33.4 % — ABNORMAL LOW (ref 36.0–46.0)
Hemoglobin: 11.4 g/dL — ABNORMAL LOW (ref 12.0–15.0)
Lymphocytes Relative: 8 % — ABNORMAL LOW (ref 12–46)
Lymphs Abs: 1 10*3/uL (ref 0.7–4.0)
MCV: 106.7 fL — ABNORMAL HIGH (ref 78.0–100.0)
Monocytes Absolute: 1 10*3/uL (ref 0.1–1.0)
Monocytes Relative: 8 % (ref 3–12)
Neutro Abs: 10.8 10*3/uL — ABNORMAL HIGH (ref 1.7–7.7)
WBC: 13 10*3/uL — ABNORMAL HIGH (ref 4.0–10.5)

## 2013-01-22 MED ORDER — HYDROMORPHONE HCL PF 1 MG/ML IJ SOLN
1.0000 mg | Freq: Once | INTRAMUSCULAR | Status: AC
  Administered 2013-01-22: 1 mg via INTRAVENOUS
  Filled 2013-01-22: qty 1

## 2013-01-22 MED ORDER — OXYCODONE-ACETAMINOPHEN 5-325 MG PO TABS: 2.0000 | ORAL_TABLET | Freq: Four times a day (QID) | ORAL | Status: DC | PRN

## 2013-01-22 MED ORDER — MORPHINE SULFATE 4 MG/ML IJ SOLN
6.0000 mg | Freq: Once | INTRAMUSCULAR | Status: AC
  Administered 2013-01-22: 6 mg via INTRAVENOUS
  Filled 2013-01-22: qty 2

## 2013-01-22 MED ORDER — DIAZEPAM 5 MG/ML IJ SOLN
5.0000 mg | Freq: Once | INTRAMUSCULAR | Status: AC
  Administered 2013-01-22: 5 mg via INTRAVENOUS
  Filled 2013-01-22: qty 2

## 2013-01-22 MED ORDER — KETOROLAC TROMETHAMINE 30 MG/ML IJ SOLN
30.0000 mg | Freq: Once | INTRAMUSCULAR | Status: AC
  Administered 2013-01-22: 30 mg via INTRAVENOUS
  Filled 2013-01-22: qty 1

## 2013-01-22 MED ORDER — DIAZEPAM 5 MG PO TABS: 5.0000 mg | ORAL_TABLET | Freq: Four times a day (QID) | ORAL | Status: DC | PRN

## 2013-01-22 NOTE — ED Notes (Addendum)
Pt fell about 2 hours ago on left hip. BP 120/78. HR 100. sts left leg shorter than right

## 2013-01-22 NOTE — ED Notes (Signed)
Patient transported to X-ray 

## 2013-01-22 NOTE — ED Provider Notes (Signed)
History     CSN: [id number]  Arrival date & time 01/22/13  1626   First MD Initiated Contact with Patient 01/22/13 1630      Chief Complaint  Patient presents with  . Fall    (Consider location/radiation/quality/duration/timing/severity/associated sxs/prior treatment) The history is provided by the patient.  [name] is a 52 y.o. female history of CHF, COPD, seizures here presenting after fall. She was walking to the kitchen and had a mechanical fall and fell on the left hip. Denies any head or neck injury. Denies any syncope or loss of consciousness. She was unable to bear weight on the left leg afterwards. Denies any other extremity pain.    Past Medical History  Diagnosis Date  . Right-sided heart failure   . COPD (chronic obstructive pulmonary disease)   . Emphysema   . Seizures   . Thyroid disease   . Depression   . Emphysema   . Arthritis     back and legs  . Blood transfusion without reported diagnosis     History reviewed. No pertinent past surgical history.  History reviewed. No pertinent family history.  History  Substance Use Topics  . Smoking status: Not on file  . Smokeless tobacco: Not on file  . Alcohol Use: Not on file    OB History   Grav Para Term Preterm Abortions TAB SAB Ect Mult Living                  Review of Systems  Musculoskeletal:       L leg pain   All other systems reviewed and are negative.    Allergies  Review of patient's allergies indicates no known allergies.  Home Medications   Current Outpatient Rx  Name  Route  Sig  Dispense  Refill  . ALPRAZolam (XANAX) 0.5 MG tablet   Oral   Take 0.5 mg by mouth 2 (two) times daily as needed for anxiety.         . Aspirin-Acetaminophen-Caffeine (GOODY HEADACHE PO)   Oral   Take 1 Package by mouth daily as needed (for headache).         . Cholecalciferol (VITAMIN D PO)   Oral   Take 1 tablet by mouth daily. Takes either 3,000 or 5,000 units once daily.         Marland Kitchen diltiazem (CARDIZEM CD) 240 MG 24 hr capsule   Oral   Take 240 mg by mouth daily.         . DULoxetine (CYMBALTA) 60 MG capsule   Oral   Take 60 mg by mouth 2 (two) times daily.         . ferrous sulfate 325 (65 FE) MG EC tablet   Oral   Take 325 mg by mouth daily.         . Fluticasone-Salmeterol (ADVAIR) 250-50 MCG/DOSE AEPB   Inhalation   Inhale 1 puff into the lungs every 12 (twelve) hours.         . levETIRAcetam (KEPPRA) 250 MG tablet   Oral   Take 250 mg by mouth every 12 (twelve) hours.         Marland Kitchen levothyroxine (SYNTHROID, LEVOTHROID) 150 MCG tablet   Oral   Take 150 mcg by mouth daily.         . ondansetron (ZOFRAN) 4 MG tablet   Oral   Take 1 tablet (4 mg total) by mouth every 6 (six) hours.   12 tablet  0   . pantoprazole (PROTONIX) 40 MG tablet   Oral   Take 40 mg by mouth 2 (two) times daily.         . potassium chloride SA (K-DUR,KLOR-CON) 20 MEQ tablet   Oral   Take 20 mEq by mouth daily.         Marland Kitchen tiotropium (SPIRIVA) 18 MCG inhalation capsule   Inhalation   Place 18 mcg into inhaler and inhale daily.         . traZODone (DESYREL) 150 MG tablet   Oral   Take 150 mg by mouth at bedtime.         . vitamin B-12 (CYANOCOBALAMIN) 1000 MCG tablet   Oral   Take 1,000 mcg by mouth daily.           BP 96/66  Pulse 111  Temp(Src) 99.3 F (37.4 C) (Oral)  Resp 16  SpO2 96%  Physical Exam  Nursing note and vitals reviewed. Constitutional: She is oriented to person, place, and time.  Uncomfortable, chronically ill   HENT:  Head: Normocephalic.  MM slightly dry   Eyes: Conjunctivae are normal. Pupils are equal, round, and reactive to light.  Neck: Normal range of motion. Neck supple.  Cardiovascular: Normal rate, regular rhythm and normal heart sounds.   Pulmonary/Chest: Effort normal and breath sounds normal. No respiratory distress. She has no wheezes. She has no rales. She exhibits no tenderness.  Abdominal: Soft.  Bowel sounds are normal. She exhibits no distension. There is no tenderness. There is no rebound and no guarding.  Musculoskeletal:  Pelvis stable. L hip dec ROM. Also slightly shortened and externally rotated.  2+ pulses. R hip nl ROM.   Neurological: She is alert and oriented to person, place, and time.  Skin: Skin is warm.  Psychiatric: She has a normal mood and affect. Her behavior is normal. Judgment and thought content normal.    ED Course  Procedures (including critical care time)  Labs Reviewed  CBC WITH DIFFERENTIAL - Abnormal; Notable for the following:    WBC 13.0 (*)    RBC 3.13 (*)    Hemoglobin 11.4 (*)    HCT 33.4 (*)    MCV 106.7 (*)    MCH 36.4 (*)    Neutrophils Relative % 83 (*)    Neutro Abs 10.8 (*)    Lymphocytes Relative 8 (*)    All other components within normal limits  BASIC METABOLIC PANEL - Abnormal; Notable for the following:    Glucose, Bld 113 (*)    Calcium 8.3 (*)    GFR calc non Af Amer 70 (*)    GFR calc Af Amer 81 (*)    All other components within normal limits  TROPONIN I  PROTIME-INR  TYPE AND SCREEN  ABO/RH   Dg Chest 1 View  01/22/2013   *RADIOLOGY REPORT*  Clinical Data: Status post fall, lower rib pain  CHEST - 1 VIEW  Comparison: 12/23/2012  Findings: Chronic interstitial markings/emphysematous changes. Mild left basilar scarring versus atelectasis. No pleural effusion or pneumothorax.  The heart is top normal in size.  Healing left lateral seventh rib fracture with callus formation.  IMPRESSION: No evidence of acute cardiopulmonary disease.  Healing left lateral seventh rib fracture.   Original Report Authenticated By: Charline Bills, M.D.   Dg Hip Complete Left  01/22/2013   *RADIOLOGY REPORT*  Clinical Data: Fall, left hip pain  LEFT HIP - COMPLETE 2+ VIEW  Comparison: None.  Findings: No  fracture or dislocation is seen.  Mild degenerative changes the bilateral hip joints.  Visualized bony pelvis appears intact.  IMPRESSION: No  fracture or dislocation is seen.   Original Report Authenticated By: Charline Bills, M.D.   Ct Pelvis Wo Contrast  01/22/2013   *RADIOLOGY REPORT*  Clinical Data: Severe left hip pain after fall.  Negative left hip radiographs peri evaluate for occult fracture.  CT PELVIS WITHOUT CONTRAST  Technique:  Multidetector CT imaging of the pelvis was performed following the standard protocol without intravenous contrast.  Comparison: 12/28/2012  Findings: No evidence of left hip fracture or dislocation.  No evidence of acetabular or pelvic fracture.  No other pelvic bone lesions identified.  No evidence of pelvic body wall or intrapelvic hematoma.  No evidence of free fluid.  No evidence of pelvic mass or inflammatory process.  No evidence of dilated pelvic bowel loops.  Prior hysterectomy noted.  IMPRESSION: Negative.  No evidence of left hip fracture or other acute findings.   Original Report Authenticated By: Myles Rosenthal, M.D.   Dg Knee Complete 4 Views Left  01/22/2013   *RADIOLOGY REPORT*  Clinical Data: Knee pain, fall.  LEFT KNEE - COMPLETE 4+ VIEW  Comparison: 04/09/2010  Findings: Mild tricompartment degenerative changes, similar to prior study. No acute bony abnormality.  Specifically, no fracture, subluxation, or dislocation.  Soft tissues are intact.  No joint effusion.  IMPRESSION: No acute bony abnormality.   Original Report Authenticated By: Charlett Nose, M.D.     No diagnosis found.   Date: 01/22/2013  Rate: 110  Rhythm: sinus tachycardia  QRS Axis: normal  Intervals: normal  ST/T Wave abnormalities: nonspecific ST changes  Conduction Disutrbances:none  Narrative Interpretation:   Old EKG Reviewed: none available    MDM  AVIGAIL PILLING is a 52 y.o. female here with L hip pain s/p fall. Will need to r/o pelvic vs hip fracture. Will get preop labs.   6 PM No fracture on xray. Patient still in pain. Will need to r/o occult hip fracture so CT pel ordered.   10:08 PM CT showed no  fracture. She is able to get up but doesn't walk very well. I recommend admission but patient states that she rather get some pain meds to go home. She has outpatient echo ordered and she is more comfortable at home.         Richardean Canal, MD 01/22/13 [phone]

## 2013-01-23 LAB — ABO/RH: ABO/RH(D): O POS

## 2013-04-06 ENCOUNTER — Other Ambulatory Visit: Payer: Self-pay | Admitting: Geriatric Medicine

## 2013-04-19 ENCOUNTER — Other Ambulatory Visit: Payer: Self-pay | Admitting: *Deleted

## 2013-05-18 ENCOUNTER — Other Ambulatory Visit: Payer: Self-pay | Admitting: *Deleted

## 2013-05-30 ENCOUNTER — Other Ambulatory Visit: Payer: Self-pay | Admitting: *Deleted

## 2013-06-16 ENCOUNTER — Other Ambulatory Visit: Payer: Self-pay | Admitting: *Deleted

## 2013-06-27 ENCOUNTER — Other Ambulatory Visit: Payer: Self-pay | Admitting: *Deleted

## 2013-06-27 MED ORDER — PANTOPRAZOLE SODIUM 40 MG PO TBEC: 40.0000 mg | DELAYED_RELEASE_TABLET | Freq: Two times a day (BID) | ORAL | Status: AC

## 2013-08-13 ENCOUNTER — Encounter: Payer: Self-pay | Admitting: Cardiology

## 2013-08-15 ENCOUNTER — Ambulatory Visit (INDEPENDENT_AMBULATORY_CARE_PROVIDER_SITE_OTHER): Payer: Medicare Other | Admitting: Cardiology

## 2013-08-15 ENCOUNTER — Encounter: Payer: Self-pay | Admitting: Cardiology

## 2013-08-15 VITALS — BP 120/76 | HR 105 | Ht 62.0 in | Wt 152.4 lb

## 2013-08-15 DIAGNOSIS — F17200 Nicotine dependence, unspecified, uncomplicated: Secondary | ICD-10-CM

## 2013-08-15 DIAGNOSIS — R079 Chest pain, unspecified: Secondary | ICD-10-CM

## 2013-08-15 DIAGNOSIS — I2510 Atherosclerotic heart disease of native coronary artery without angina pectoris: Secondary | ICD-10-CM

## 2013-08-15 DIAGNOSIS — J449 Chronic obstructive pulmonary disease, unspecified: Secondary | ICD-10-CM

## 2013-08-15 DIAGNOSIS — Z720 Tobacco use: Secondary | ICD-10-CM

## 2013-08-15 DIAGNOSIS — I2584 Coronary atherosclerosis due to calcified coronary lesion: Secondary | ICD-10-CM

## 2013-08-15 NOTE — Progress Notes (Signed)
[address] [place], [place]  [address] Phone: [phone] Fax:  [phone]  Date:  08/15/2013   ID:  [name], DOB December 01, 1960, MRN [id number]  PCP:  Dema Severin, NP   History of Present Illness: [name] is a 52 y.o. female was in hospital twice this past month. Severe COPD exacerbation/pneumonia. BiPap. ECHO in December. CXR showed PNA. CT scan showed CAD (cornary calcification). Saw Dr. In Rosalita Levan who did not clear her previously for surgery said she was not getting enough oxygen to her heart. No prior MI. Has had prior endoscopy and hip surgery without difficultly after she was told that she was too high risk for surgery by previous cardiologist.  She is currently recovering from her hospitalization. She is being seen today at the request of her primary practitioner to discover if further action needs to be taken on her coronary artery calcification in her overall general cardiac health.  She currently has pain with coughing, chronic shortness of breath. No significant exertional change.  Her medical records were reviewed.      Wt Readings from Last 3 Encounters:  08/15/13 152 lb 6.4 oz (69.128 kg)     Past Medical History  Diagnosis Date  . Right-sided heart failure   . COPD (chronic obstructive pulmonary disease)   . Emphysema   . Seizures   . Thyroid disease   . Depression   . Emphysema   . Arthritis     back and legs  . Blood transfusion without reported diagnosis   . High cholesterol   . Anxiety   . Nerves   . Panic attack   . Thyroid disorder     No past surgical history on file.  Current Outpatient Prescriptions  Medication Sig Dispense Refill  . ALPRAZolam (XANAX) 0.5 MG tablet Take 0.5 mg by mouth 2 (two) times daily as needed for anxiety.      Marland Kitchen amoxicillin-clavulanate (AUGMENTIN) 875-125 MG per tablet Take 1 tablet by mouth 2 (two) times daily.      . Cholecalciferol (VITAMIN D PO) Take 1 tablet by mouth daily.  Takes either 3,000 or 5,000 units once daily.      Marland Kitchen diltiazem (CARDIZEM CD) 240 MG 24 hr capsule Take 240 mg by mouth daily.      . DULoxetine (CYMBALTA) 60 MG capsule Take 60 mg by mouth 2 (two) times daily.      . ferrous sulfate 325 (65 FE) MG EC tablet Take 325 mg by mouth daily.      . Fluticasone-Salmeterol (ADVAIR) 250-50 MCG/DOSE AEPB Inhale 1 puff into the lungs every 12 (twelve) hours.      Marland Kitchen levothyroxine (SYNTHROID, LEVOTHROID) 150 MCG tablet Take 150 mcg by mouth daily.      . ondansetron (ZOFRAN) 4 MG tablet Take 1 tablet (4 mg total) by mouth every 6 (six) hours.  12 tablet  0  . Oxycodone HCl 10 MG TABS Take 10 mg by mouth every 6 (six) hours as needed.      . pantoprazole (PROTONIX) 40 MG tablet Take 1 tablet (40 mg total) by mouth 2 (two) times daily.  60 tablet  2  . PREDNISONE PO Take by mouth as directed.      . tiotropium (SPIRIVA) 18 MCG inhalation capsule Place 18 mcg into inhaler and inhale daily.      . traZODone (DESYREL) 150 MG tablet Take 150 mg by mouth at bedtime.      Marland Kitchen  vitamin B-12 (CYANOCOBALAMIN) 1000 MCG tablet Take 1,000 mcg by mouth daily.       No current facility-administered medications for this visit.    Allergies:   No Known Allergies  Social History:  The patient  reports that she has been smoking.  She does not have any smokeless tobacco history on file.   ROS:  Please see the history of present illness.   Denies any strokes, bleeding, orthopnea, PND. Positive for shortness of breath with COPD. Chest pain secondary to cough/rib fracture.    PHYSICAL EXAM: VS:  BP 120/76  Pulse 105  Ht 5\' 2"  (1.575 m)  Wt 152 lb 6.4 oz (69.128 kg)  BMI 27.87 kg/m2 Well nourished, well developed, in no acute distress looks older than stated age, looks tired HEENT: normalNasal cannula oxygen in place Neck: no JVD Cardiac:  normal S1, S2; RRR; no murmur Lungs:  Diffuse rhonchi mostly left with minor wheeze, minimally increased respiratory effort.  Abd: soft,  nontender, no hepatomegaly Ext: no edema Skin: warm and dry Neuro: no focal abnormalities noted  EKG:  Sinus tachycardia rate 105 with no other specific abnormalities other than low voltage Echocardiogram 6/14-normal ejection fraction, normal right ventricle, no evidence of increased central venous pressure, unable to estimate pulmonary artery pressure. I do not have most recent echocardiogram from December of 2014.    Cardiac catheterization 2004-no CAD  ASSESSMENT AND PLAN:  1. Coronary artery calcification-according to her history, this was seen on a CT scan. I would like to ultimately check a pharmacologic stress test on her but first I would like for her to get over her recent respiratory failure/pneumonia. She is just recently out of the hospital I want to give her some time to recuperate. After that, we will likely proceed. I will see her back in one month for further evaluation. Reassuring way, cardiac catheterization approximately 10 years ago was normal. Ejection fraction in June 2014 was normal. 2. Chest pain/rib fracture/severe COPD/hypoxia-I did not want to give her nitroglycerin at this point. Her chest pain could most likely be secondary to musculoskeletal/COPD. We will further evaluate. 3. Tobacco abuse-encourage tobacco cessation. She used to smoke 2 packs a day, now down to 2 cigarettes she states. 4. We will see her back in one month.  Signed, Donato Schultz, MD [facility]  08/15/2013 3:02 PM

## 2013-08-15 NOTE — Patient Instructions (Signed)
Your physician recommends that you continue on your current medications as directed. Please refer to the Current Medication list given to you today.  Your physician recommends that you schedule a follow-up appointment in:  1 month with Dr. Skains.  

## 2013-08-20 ENCOUNTER — Inpatient Hospital Stay (HOSPITAL_COMMUNITY)
Admission: EM | Admit: 2013-08-20 | Discharge: 2013-08-26 | DRG: 208 | Disposition: A | Discharge status: Home / Self Care | Payer: Medicare Other | Attending: Pulmonary Disease | Admitting: Pulmonary Disease

## 2013-08-20 ENCOUNTER — Emergency Department (HOSPITAL_COMMUNITY): Payer: Medicare Other

## 2013-08-20 ENCOUNTER — Encounter (HOSPITAL_COMMUNITY): Payer: Self-pay | Admitting: Emergency Medicine

## 2013-08-20 ENCOUNTER — Inpatient Hospital Stay (HOSPITAL_COMMUNITY): Payer: Medicare Other

## 2013-08-20 DIAGNOSIS — F17200 Nicotine dependence, unspecified, uncomplicated: Secondary | ICD-10-CM

## 2013-08-20 DIAGNOSIS — J984 Other disorders of lung: Secondary | ICD-10-CM

## 2013-08-20 DIAGNOSIS — J449 Chronic obstructive pulmonary disease, unspecified: Secondary | ICD-10-CM

## 2013-08-20 DIAGNOSIS — G4730 Sleep apnea, unspecified: Secondary | ICD-10-CM

## 2013-08-20 DIAGNOSIS — R7309 Other abnormal glucose: Secondary | ICD-10-CM | POA: Diagnosis present

## 2013-08-20 DIAGNOSIS — E7800 Pure hypercholesterolemia, unspecified: Secondary | ICD-10-CM | POA: Diagnosis present

## 2013-08-20 DIAGNOSIS — E781 Pure hyperglyceridemia: Secondary | ICD-10-CM | POA: Diagnosis present

## 2013-08-20 DIAGNOSIS — J9600 Acute respiratory failure, unspecified whether with hypoxia or hypercapnia: Secondary | ICD-10-CM

## 2013-08-20 DIAGNOSIS — M129 Arthropathy, unspecified: Secondary | ICD-10-CM | POA: Diagnosis present

## 2013-08-20 DIAGNOSIS — IMO0001 Reserved for inherently not codable concepts without codable children: Secondary | ICD-10-CM | POA: Diagnosis present

## 2013-08-20 DIAGNOSIS — IMO0002 Reserved for concepts with insufficient information to code with codable children: Secondary | ICD-10-CM | POA: Diagnosis present

## 2013-08-20 DIAGNOSIS — R4020 Unspecified coma: Secondary | ICD-10-CM | POA: Diagnosis present

## 2013-08-20 DIAGNOSIS — N008 Acute nephritic syndrome with other morphologic changes: Secondary | ICD-10-CM | POA: Diagnosis present

## 2013-08-20 DIAGNOSIS — N179 Acute kidney failure, unspecified: Secondary | ICD-10-CM | POA: Diagnosis not present

## 2013-08-20 DIAGNOSIS — G9340 Encephalopathy, unspecified: Secondary | ICD-10-CM | POA: Diagnosis not present

## 2013-08-20 DIAGNOSIS — Z8541 Personal history of malignant neoplasm of cervix uteri: Secondary | ICD-10-CM

## 2013-08-20 DIAGNOSIS — F411 Generalized anxiety disorder: Secondary | ICD-10-CM

## 2013-08-20 DIAGNOSIS — J111 Influenza due to unidentified influenza virus with other respiratory manifestations: Secondary | ICD-10-CM

## 2013-08-20 DIAGNOSIS — F410 Panic disorder [episodic paroxysmal anxiety] without agoraphobia: Secondary | ICD-10-CM | POA: Diagnosis present

## 2013-08-20 DIAGNOSIS — Z6826 Body mass index (BMI) 26.0-26.9, adult: Secondary | ICD-10-CM

## 2013-08-20 DIAGNOSIS — R109 Unspecified abdominal pain: Secondary | ICD-10-CM

## 2013-08-20 DIAGNOSIS — Z9079 Acquired absence of other genital organ(s): Secondary | ICD-10-CM

## 2013-08-20 DIAGNOSIS — M545 Low back pain: Secondary | ICD-10-CM

## 2013-08-20 DIAGNOSIS — Z888 Allergy status to other drugs, medicaments and biological substances status: Secondary | ICD-10-CM

## 2013-08-20 DIAGNOSIS — E874 Mixed disorder of acid-base balance: Secondary | ICD-10-CM | POA: Diagnosis present

## 2013-08-20 DIAGNOSIS — F329 Major depressive disorder, single episode, unspecified: Secondary | ICD-10-CM | POA: Diagnosis present

## 2013-08-20 DIAGNOSIS — E669 Obesity, unspecified: Secondary | ICD-10-CM | POA: Diagnosis present

## 2013-08-20 DIAGNOSIS — Z79899 Other long term (current) drug therapy: Secondary | ICD-10-CM

## 2013-08-20 DIAGNOSIS — T4275XA Adverse effect of unspecified antiepileptic and sedative-hypnotic drugs, initial encounter: Secondary | ICD-10-CM | POA: Diagnosis not present

## 2013-08-20 DIAGNOSIS — Z9981 Dependence on supplemental oxygen: Secondary | ICD-10-CM

## 2013-08-20 DIAGNOSIS — I509 Heart failure, unspecified: Secondary | ICD-10-CM | POA: Diagnosis present

## 2013-08-20 DIAGNOSIS — K219 Gastro-esophageal reflux disease without esophagitis: Secondary | ICD-10-CM | POA: Diagnosis present

## 2013-08-20 DIAGNOSIS — N183 Chronic kidney disease, stage 3 unspecified: Secondary | ICD-10-CM | POA: Diagnosis present

## 2013-08-20 DIAGNOSIS — F3289 Other specified depressive episodes: Secondary | ICD-10-CM | POA: Diagnosis present

## 2013-08-20 DIAGNOSIS — J9690 Respiratory failure, unspecified, unspecified whether with hypoxia or hypercapnia: Secondary | ICD-10-CM

## 2013-08-20 DIAGNOSIS — J441 Chronic obstructive pulmonary disease with (acute) exacerbation: Secondary | ICD-10-CM | POA: Diagnosis present

## 2013-08-20 DIAGNOSIS — R945 Abnormal results of liver function studies: Secondary | ICD-10-CM

## 2013-08-20 DIAGNOSIS — E875 Hyperkalemia: Secondary | ICD-10-CM | POA: Diagnosis present

## 2013-08-20 DIAGNOSIS — Z86711 Personal history of pulmonary embolism: Secondary | ICD-10-CM

## 2013-08-20 LAB — BLOOD GAS, ARTERIAL
FIO2: 0.6 %
MECHVT: 480 mL
O2 Saturation: 99 %
PEEP: 8 cmH2O
Patient temperature: 98.6
TCO2: 31 mmol/L (ref 0–100)
pH, Arterial: 7.34 — ABNORMAL LOW (ref 7.350–7.450)

## 2013-08-20 LAB — POCT I-STAT 3, ART BLOOD GAS (G3+)
Acid-Base Excess: 1 mmol/L (ref 0.0–2.0)
Acid-Base Excess: 1 mmol/L (ref 0.0–2.0)
Bicarbonate: 30.4 mEq/L — ABNORMAL HIGH (ref 20.0–24.0)
O2 Saturation: 95 %
O2 Saturation: 91 %
Patient temperature: 98.8
Patient temperature: 98.8
TCO2: 33 mmol/L (ref 0–100)
pCO2 arterial: 92.4 mmHg (ref 35.0–45.0)
pCO2 arterial: 70.9 mmHg (ref 35.0–45.0)
pH, Arterial: 7.241 — ABNORMAL LOW (ref 7.350–7.450)
pO2, Arterial: 76 mmHg — ABNORMAL LOW (ref 80.0–100.0)

## 2013-08-20 LAB — URINE MICROSCOPIC-ADD ON

## 2013-08-20 LAB — CG4 I-STAT (LACTIC ACID): Lactic Acid, Venous: 1.28 mmol/L (ref 0.5–2.2)

## 2013-08-20 LAB — CBC WITH DIFFERENTIAL/PLATELET
Basophils Absolute: 0.1 10*3/uL (ref 0.0–0.1)
Lymphs Abs: 1.3 10*3/uL (ref 0.7–4.0)
MCV: 102.3 fL — ABNORMAL HIGH (ref 78.0–100.0)
Monocytes Relative: 5 % (ref 3–12)
Neutrophils Relative %: 82 % — ABNORMAL HIGH (ref 43–77)
Platelets: 192 10*3/uL (ref 150–400)
RDW: 16.3 % — ABNORMAL HIGH (ref 11.5–15.5)
WBC: 10.8 10*3/uL — ABNORMAL HIGH (ref 4.0–10.5)

## 2013-08-20 LAB — AMMONIA

## 2013-08-20 LAB — BASIC METABOLIC PANEL
Calcium: 8.4 mg/dL (ref 8.4–10.5)
GFR calc Af Amer: 43 mL/min — ABNORMAL LOW (ref >90)
GFR calc non Af Amer: 37 mL/min — ABNORMAL LOW (ref >90)
Glucose, Bld: 145 mg/dL — ABNORMAL HIGH (ref 70–99)
Sodium: 136 mEq/L (ref 135–145)

## 2013-08-20 LAB — URINALYSIS, ROUTINE W REFLEX MICROSCOPIC
Bilirubin Urine: NEGATIVE
Glucose, UA: NEGATIVE mg/dL
Hgb urine dipstick: NEGATIVE
Ketones, ur: NEGATIVE mg/dL
Protein, ur: 30 mg/dL — AB

## 2013-08-20 LAB — LACTIC ACID, PLASMA: Lactic Acid, Venous: 0.9 mmol/L (ref 0.5–2.2)

## 2013-08-20 LAB — POCT I-STAT TROPONIN I: Troponin i, poc: 0.02 ng/mL (ref 0.00–0.08)

## 2013-08-20 MED ORDER — INSULIN ASPART 100 UNIT/ML ~~LOC~~ SOLN
2.0000 [IU] | SUBCUTANEOUS | Status: DC
  Administered 2013-08-21 – 2013-08-24 (×10): 2 [IU] via SUBCUTANEOUS
  Administered 2013-08-24: 4 [IU] via SUBCUTANEOUS
  Administered 2013-08-25 (×2): 2 [IU] via SUBCUTANEOUS
  Administered 2013-08-25: 4 [IU] via SUBCUTANEOUS
  Administered 2013-08-25 – 2013-08-26 (×2): 2 [IU] via SUBCUTANEOUS
  Administered 2013-08-26: 13:00:00 4 [IU] via SUBCUTANEOUS

## 2013-08-20 MED ORDER — VITAL AF 1.2 CAL PO LIQD
1000.0000 mL | ORAL | Status: DC
  Administered 2013-08-21: 1000 mL
  Filled 2013-08-20 (×3): qty 1000

## 2013-08-20 MED ORDER — BIOTENE DRY MOUTH MT LIQD
15.0000 mL | Freq: Four times a day (QID) | OROMUCOSAL | Status: DC
  Administered 2013-08-21 – 2013-08-24 (×10): 15 mL via OROMUCOSAL

## 2013-08-20 MED ORDER — METHYLPREDNISOLONE SODIUM SUCC 125 MG IJ SOLR
60.0000 mg | Freq: Four times a day (QID) | INTRAMUSCULAR | Status: DC
  Administered 2013-08-20 – 2013-08-22 (×7): 60 mg via INTRAVENOUS
  Filled 2013-08-20 (×10): qty 0.96

## 2013-08-20 MED ORDER — DEXTROSE 5 % IV SOLN
2.0000 g | Freq: Two times a day (BID) | INTRAVENOUS | Status: DC
  Administered 2013-08-20: 2 g via INTRAVENOUS

## 2013-08-20 MED ORDER — LEVOTHYROXINE SODIUM 150 MCG PO TABS
150.0000 ug | ORAL_TABLET | Freq: Every day | ORAL | Status: DC
  Administered 2013-08-21 – 2013-08-26 (×6): 150 ug via ORAL
  Filled 2013-08-20 (×9): qty 1

## 2013-08-20 MED ORDER — DILTIAZEM HCL ER COATED BEADS 300 MG PO CP24: 300.0000 mg | ORAL_CAPSULE | Freq: Every day | ORAL | Status: DC

## 2013-08-20 MED ORDER — NALOXONE HCL 0.4 MG/ML IJ SOLN: 0.4000 mg | Freq: Once | INTRAMUSCULAR | Status: DC

## 2013-08-20 MED ORDER — SUCCINYLCHOLINE CHLORIDE 20 MG/ML IJ SOLN
100.0000 mg | Freq: Once | INTRAMUSCULAR | Status: AC
  Administered 2013-08-20: 100 mg via INTRAVENOUS

## 2013-08-20 MED ORDER — PROPOFOL 10 MG/ML IV EMUL
INTRAVENOUS | Status: AC
  Administered 2013-08-20: 20 ug/kg/min
  Filled 2013-08-20: qty 100

## 2013-08-20 MED ORDER — ASPIRIN 81 MG PO CHEW: 324.0000 mg | CHEWABLE_TABLET | ORAL | Status: AC

## 2013-08-20 MED ORDER — DULOXETINE HCL 60 MG PO CPEP: 60.0000 mg | ORAL_CAPSULE | Freq: Two times a day (BID) | ORAL | Status: DC

## 2013-08-20 MED ORDER — METHYLPREDNISOLONE SODIUM SUCC 125 MG IJ SOLR
125.0000 mg | Freq: Once | INTRAMUSCULAR | Status: AC
  Administered 2013-08-20: 125 mg via INTRAVENOUS
  Filled 2013-08-20: qty 2

## 2013-08-20 MED ORDER — OSELTAMIVIR PHOSPHATE 6 MG/ML PO SUSR
75.0000 mg | Freq: Every day | ORAL | Status: DC
  Filled 2013-08-20: qty 12.5

## 2013-08-20 MED ORDER — PANTOPRAZOLE SODIUM 40 MG IV SOLR
40.0000 mg | Freq: Every day | INTRAVENOUS | Status: DC
  Administered 2013-08-21 – 2013-08-22 (×3): 40 mg via INTRAVENOUS
  Filled 2013-08-20 (×4): qty 40

## 2013-08-20 MED ORDER — FENTANYL CITRATE 0.05 MG/ML IJ SOLN
100.0000 ug | Freq: Once | INTRAMUSCULAR | Status: AC
  Administered 2013-08-20: 100 ug via INTRAVENOUS

## 2013-08-20 MED ORDER — MIDAZOLAM HCL 2 MG/2ML IJ SOLN
4.0000 mg | Freq: Once | INTRAMUSCULAR | Status: AC
  Administered 2013-08-20: 4 mg via INTRAVENOUS

## 2013-08-20 MED ORDER — DEXTROSE 5 % IV SOLN
2.0000 g | INTRAVENOUS | Status: DC
  Administered 2013-08-20 – 2013-08-21 (×2): 2 g via INTRAVENOUS
  Filled 2013-08-20 (×2): qty 2

## 2013-08-20 MED ORDER — NALOXONE HCL 1 MG/ML IJ SOLN
2.0000 mg | Freq: Once | INTRAMUSCULAR | Status: AC
  Administered 2013-08-20: 2 mg via INTRAVENOUS

## 2013-08-20 MED ORDER — IPRATROPIUM BROMIDE 0.02 % IN SOLN
0.5000 mg | RESPIRATORY_TRACT | Status: DC
  Administered 2013-08-20 – 2013-08-22 (×10): 0.5 mg via RESPIRATORY_TRACT
  Filled 2013-08-20 (×9): qty 2.5

## 2013-08-20 MED ORDER — HEPARIN SODIUM (PORCINE) 5000 UNIT/ML IJ SOLN
5000.0000 [IU] | Freq: Three times a day (TID) | INTRAMUSCULAR | Status: DC
  Administered 2013-08-21 – 2013-08-26 (×16): 5000 [IU] via SUBCUTANEOUS
  Filled 2013-08-20 (×20): qty 1

## 2013-08-20 MED ORDER — DOCUSATE SODIUM 50 MG/5ML PO LIQD
100.0000 mg | Freq: Two times a day (BID) | ORAL | Status: DC
  Administered 2013-08-21 (×2): 100 mg via ORAL
  Filled 2013-08-20 (×3): qty 10

## 2013-08-20 MED ORDER — CHLORHEXIDINE GLUCONATE 0.12 % MT SOLN
15.0000 mL | Freq: Two times a day (BID) | OROMUCOSAL | Status: DC
  Administered 2013-08-20 – 2013-08-24 (×5): 15 mL via OROMUCOSAL
  Filled 2013-08-20 (×9): qty 15

## 2013-08-20 MED ORDER — ALBUTEROL SULFATE (2.5 MG/3ML) 0.083% IN NEBU
2.5000 mg | INHALATION_SOLUTION | RESPIRATORY_TRACT | Status: DC
  Administered 2013-08-20 – 2013-08-23 (×14): 2.5 mg via RESPIRATORY_TRACT
  Filled 2013-08-20: qty 3
  Filled 2013-08-20 (×3): qty 0.5
  Filled 2013-08-20 (×2): qty 3
  Filled 2013-08-20: qty 0.5
  Filled 2013-08-20: qty 3
  Filled 2013-08-20 (×2): qty 0.5
  Filled 2013-08-20 (×2): qty 3
  Filled 2013-08-20: qty 0.5
  Filled 2013-08-20: qty 3
  Filled 2013-08-20 (×2): qty 0.5
  Filled 2013-08-20 (×5): qty 3
  Filled 2013-08-20: qty 0.5
  Filled 2013-08-20 (×2): qty 3

## 2013-08-20 MED ORDER — OSELTAMIVIR PHOSPHATE 6 MG/ML PO SUSR
75.0000 mg | Freq: Two times a day (BID) | ORAL | Status: DC
  Filled 2013-08-20: qty 12.5

## 2013-08-20 MED ORDER — OXYCODONE HCL 5 MG/5ML PO SOLN
5.0000 mg | Freq: Four times a day (QID) | ORAL | Status: DC
  Administered 2013-08-21 (×2): 5 mg via ORAL
  Filled 2013-08-20 (×2): qty 5

## 2013-08-20 MED ORDER — PROPOFOL 10 MG/ML IV EMUL
5.0000 ug/kg/min | INTRAVENOUS | Status: DC
  Administered 2013-08-21: 60 ug/kg/min via INTRAVENOUS
  Filled 2013-08-20 (×3): qty 100

## 2013-08-20 MED ORDER — ASPIRIN 300 MG RE SUPP
300.0000 mg | RECTAL | Status: AC
  Administered 2013-08-20: 300 mg via RECTAL
  Filled 2013-08-20: qty 1

## 2013-08-20 MED ORDER — ETOMIDATE 2 MG/ML IV SOLN
20.0000 mg | Freq: Once | INTRAVENOUS | Status: AC
  Administered 2013-08-20: 20 mg via INTRAVENOUS

## 2013-08-20 MED ORDER — OSELTAMIVIR PHOSPHATE 6 MG/ML PO SUSR
75.0000 mg | Freq: Every day | ORAL | Status: DC
  Administered 2013-08-21: 75 mg via ORAL
  Filled 2013-08-20 (×2): qty 12.5

## 2013-08-20 NOTE — ED Notes (Signed)
Family at bedside. 

## 2013-08-20 NOTE — ED Notes (Signed)
MD at bedside. Critical Care at Bedside

## 2013-08-20 NOTE — ED Notes (Signed)
[facility] called out by family b/c pt. Lethargic/stupor for 3-4 hours. Pt. Intermittently responsive to verbal and physical stimulation. EMS states, "expiratory wheezing throughout." hx. Of COPD. EMS gave x 2 Albuterol 0.5/Atrovent 0.5 mg, 2 grams Mag. Sulfate, and 0.3 mg Epi. Per their protocol.

## 2013-08-20 NOTE — ED Provider Notes (Signed)
Patient seen/examined in the Emergency Department in conjunction with Resident Physician Provider Gordy Levan Patient presents with SOB and distress Exam : somnolent, wheezing bilaterally Plan: pt given narcan and started to arouse but has wheezing bilaterally, will try bipap and monitor resp status   Joya Gaskins, MD 08/20/13 1926

## 2013-08-20 NOTE — H&P (Signed)
PULMONARY  / CRITICAL CARE MEDICINE  Name: [name] MRN: [id number] DOB: 07-07-1961    ADMISSION DATE:  08/20/2013 CONSULTATION DATE:  2/27  REFERRING MD :  ED PRIMARY SERVICE: Pulm/CCM  CHIEF COMPLAINT:  COPD exacerbation  BRIEF PATIENT DESCRIPTION: 52 yo with recent hospitalization for pneumonia who presented with AMS.  Patient comatose at arrival, received narcan with abrupt awakening with noted severe agitation.  Patient with wheezing and heavy work of breathing.  Intubated for respiratory failure and agitation.  SIGNIFICANT EVENTS / STUDIES:  CXR with ET tube in place.  No infiltrates noted  LINES / TUBES: ET tube in place  CULTURES: Blood Sputum Urine  ANTIBIOTICS: Cefepime 2 GM bid (due to recent abx exposure, severe copd exacerbation, recent hospitalization) Doxycycline 100 mg bid (no azithromycin due to qt c ) Tamiflu due to recent exposure to URI from husband and significant COPD exacerbation  HISTORY OF PRESENT ILLNESS:  52 yo with recent hospitalization for pneumonia who presented with AMS.  Patient comatose at arrival, received narcan with abrupt awakening with noted severe agitation.  Patient with wheezing and heavy work of breathing.  Intubated for respiratory failure and agitation.  Patient with poor po intake over last week since discharge, initially felt she was getting better, however patient with abdominal breathing today, increased somnolence, ataxia, and progressive confusion.  Around 4 pm, patient became difficult to arouse and EMS alerted.  EMS evaluated patient, found to have severe wheezing, difficulty work of breathing.  Given two breathing treatments, 2 GM magnesium, and 0.3 of epi with little change.    Following intubation for hypercapneic respiratory failure, large amount of purulent material suctioned out of ET tube.  During examination, ET tube with profuse yellowish thin sputum.  Patient with no hx of fevers chills nausea vomiting, however  over last 24 hours as per son (who lives with her), has been worsening.   Multiple hospitalizations in the last year, the last of which was 1 week ago (dx with copd and pna) and chronic medical problems including severe COPD (on home o2, continues to smoke heavily), chronic pain, CHF (HFpEF, likely diasostic vs RV dysfunction due to COPD with history of pulmonary edema and le edema), PE earlier this year in January 2014 on no home anticoagulation, and hypothyroidism.  Baseline CO2 is around 40 (probably higher as indicated by alkalosis on abg).  Little other history available.  PAST MEDICAL HISTORY :  Past Medical History  Diagnosis Date  . Right-sided heart failure   . COPD (chronic obstructive pulmonary disease)   . Emphysema   . Seizures   . Thyroid disease   . Depression   . Emphysema   . Arthritis     back and legs  . Blood transfusion without reported diagnosis   . High cholesterol   . Anxiety   . Nerves   . Panic attack   . Thyroid disorder    History reviewed. No pertinent past surgical history. Prior to Admission medications   Medication Sig Start Date End Date Taking? Authorizing Provider  ALPRAZolam Prudy Feeler) 0.5 MG tablet Take 0.5 mg by mouth 2 (two) times daily as needed for anxiety.    Historical Provider, MD  amoxicillin-clavulanate (AUGMENTIN) 875-125 MG per tablet Take 1 tablet by mouth 2 (two) times daily.    Historical Provider, MD  Cholecalciferol (VITAMIN D PO) Take 1 tablet by mouth daily. Takes either 3,000 or 5,000 units once daily.    Historical Provider, MD  diltiazem (CARDIZEM  CD) 240 MG 24 hr capsule Take 240 mg by mouth daily.    Historical Provider, MD  DULoxetine (CYMBALTA) 60 MG capsule Take 60 mg by mouth 2 (two) times daily.    Historical Provider, MD  ferrous sulfate 325 (65 FE) MG EC tablet Take 325 mg by mouth daily.    Historical Provider, MD  Fluticasone-Salmeterol (ADVAIR) 250-50 MCG/DOSE AEPB Inhale 1 puff into the lungs every 12 (twelve) hours.     Historical Provider, MD  levothyroxine (SYNTHROID, LEVOTHROID) 150 MCG tablet Take 150 mcg by mouth daily.    Historical Provider, MD  ondansetron (ZOFRAN) 4 MG tablet Take 1 tablet (4 mg total) by mouth every 6 (six) hours. 11/09/12   Gwyneth Sprout, MD  Oxycodone HCl 10 MG TABS Take 10 mg by mouth every 6 (six) hours as needed.    Historical Provider, MD  pantoprazole (PROTONIX) 40 MG tablet Take 1 tablet (40 mg total) by mouth 2 (two) times daily. 06/27/13   Tiffany L Reed, DO  PREDNISONE PO Take by mouth as directed.    Historical Provider, MD  tiotropium (SPIRIVA) 18 MCG inhalation capsule Place 18 mcg into inhaler and inhale daily.    Historical Provider, MD  traZODone (DESYREL) 150 MG tablet Take 150 mg by mouth at bedtime.    Historical Provider, MD  vitamin B-12 (CYANOCOBALAMIN) 1000 MCG tablet Take 1,000 mcg by mouth daily.    Historical Provider, MD   No Known Allergies  FAMILY HISTORY:  History reviewed. No pertinent family history. SOCIAL HISTORY:  reports that she has been smoking.  She does not have any smokeless tobacco history on file. Her alcohol and drug histories are not on file.  REVIEW OF SYSTEMS:  Unable to complete due to critical condition   SUBJECTIVE:   VITAL SIGNS: Temp:  [98.8 F (37.1 C)] 98.8 F (37.1 C) (12/27 1926) Pulse Rate:  [88-104] 104 (12/27 2000) Resp:  [28-30] 30 (12/27 2000) BP: (140)/(80) 140/80 mmHg (12/27 1926) SpO2:  [94 %-97 %] 95 % (12/27 2000) FiO2 (%):  [40 %] 40 % (12/27 2000) Weight:  [160 lb (72.576 kg)] 160 lb (72.576 kg) (12/27 1939) HEMODYNAMICS:   VENTILATOR SETTINGS: Vent Mode:  [-] PRVC FiO2 (%):  [40 %] 40 % Set Rate:  [25 bmp] 25 bmp Vt Set:  [450 mL] 450 mL PEEP:  [5 cmH20] 5 cmH20 Plateau Pressure:  [22 cmH20] 22 cmH20 INTAKE / OUTPUT: Intake/Output   None     General: VSS sedated obese female, non toxic, no diaphoresis, stable hemodynamics Head: Eyes sluggish pupillary light reflex. Normal cephalic and  atramatic.  No icterus.  Noted scleral edema.  Oral mucosa moist with frothy sputum Lungs: Severe wheezing noted bilaterally with rhonchi.  Unable to appreciate any rales.  Fair air movment.  Air trapping with Auto peep Heart: HRRR S1 S2 Pulses are 2+ & equal, no MRG noted on exam. Abdomen: No distention.  Bowel sounds are positive, abdomen soft and non-tender without masses, no hepatosplenomegaly Extremities: No clubbing, cyanosis or edema. DP +2 Neuro: Obtunded due to propofol   LABS:  Recent Labs Lab 08/20/13 1949 08/20/13 2022  LATICACIDVEN  --  1.28  PHART 7.166*  --   PCO2ART 92.4*  --   PO2ART 101.0*  --    No results found for this basename: GLUCAP,  in the last 168 hours  CXR: Initial right mainstem intubation.  Noted increased vascular marking and cephalization.  No pulmonary edema or infiltrate noted  EKG  pending  ASSESSMENT / PLAN:  PULMONARY A:  Hypercapnic respiratory Failure Multifactorial; COPD, oversedation with opiates Patient with recent hospitalization, recent antibiotic exposure, acute decompensation with AMS and severe acidosis, profuse purulent sputum production, recent contact with husband who has URI (patient has received flu shot, however N3H1 strain not covered and currently in circulation) P:   COPD exacerbation antibiotic coverage with cefepime (risk for pseudomonas with above risk factors) and doxy (avoiding azithromycin due to hx of prolonged qtc hx and cymbalta use).  No infiltrates, no CAP Tamiflu until negative flu screen, pending viral panel Methylprednisolone 125 mg x1 followed by 60 mg Q6 Duoneb breathing treatments Q6 May need diuresis, however no pulmonary edema noted; see below Placed on propofol gtt for sedation with around the clock narcotics to avoid withdraw symptoms but at half dose to avoid oversedation in critical illness. FiO2 to SpO2 >88% Patient started on lung protective ventilator strategy <6-8 cc/kg IDW Peak airway pressures  <30 PRVC Avoid dyssynchrony  CARDIOVASCULAR A: CHF with HFpEF Noted scleral edema, clear cxr, and history of le edema. History of RV dysfunction P:  Optimize hypoxia May need diuresis with signs of overload, however will allow autodiuresis and follow for now as patient stable without significantly increased o2 requirments or RV failure Holding CCB for now  RENAL A:  Hyperkalemia  Long standing CKD III reviewed medications without preciptating factors Possibly due to significant acidosis  P:   Monitor and treat primary condition  A:  CKDII  Possible AKI on top of CKD AKI Acute on chronic renal failure Multifactorial Pre-Renal/Shock/Septic Nephropathy/Acute interstitial nephritis  P: Optimize hemodynamics with judicious use of crystalloid, but good urine output currently MAP Goal >65 for adequate perfusion Foley for strict I/O's Avoid nephrotoxic substances Renally dose medications   GASTROINTESTINAL A:  GI prophylaxis  P:   Protonix IV  A:  Diet P: Initiated trickle feeds for now, increase in next 24 hours if tolerated  HEMATOLOGIC A:  DVT prophylaxis History of PE with non occlusive subsegmental pe in left lower lobes (january of this year). No home anticoagulation currently P:  Heparin tid  INFECTIOUS A:  COPD exacerbation No indication for pneumonia, no frank infiltrates P:   See above COPD exacerbation antibiotic coverage with cefepime (risk for pseudomonas with above risk factors) and doxy (avoiding azithromycin due to hx of prolonged qtc hx and cymbalta use).  No infiltrates, no CAP Tamiflu until negative flu screen, pending viral panel   ENDOCRINE A:  Hyperglycemia   Patient started on steroids with high risk of hyperglycemia P:   Goal Glucose in critically ill 140-180 SSI ICU protocol initiated   NEUROLOGIC A:  AMS Multifactorial, likely due to hypercapnia and oversedation due to narcotic use.  Possibly exacerbated by underlying infection.    P:   ED ordered CT   I have personally obtained a history, examined the patient, evaluated laboratory and imaging results, formulated the assessment and plan and placed orders. CRITICAL CARE: The patient is critically ill with multiple organ systems failure and requires high complexity decision making for assessment and support, frequent evaluation and titration of therapies, application of advanced monitoring technologies and extensive interpretation of multiple databases. Critical Care Time devoted to patient care services described in this note is 60 minutes.    Pulmonary and Critical Care Medicine [facility] Pager: [phone]  08/20/2013, 9:02 PM

## 2013-08-20 NOTE — Progress Notes (Signed)
Patient came up from ED on ventilator. Patient remained on same vent settings from ED,will obtain ABG to check acis base levels

## 2013-08-20 NOTE — ED Provider Notes (Signed)
CSN: [id number]     Arrival date & time 08/20/13  1918 History   First MD Initiated Contact with Patient 08/20/13 1924     Chief Complaint  Patient presents with  . Respiratory Distress   (Consider location/radiation/quality/duration/timing/severity/associated sxs/prior Treatment) Patient is a 52 y.o. female presenting with shortness of breath. The history is provided by the EMS personnel. The history is limited by the condition of the patient.  Shortness of Breath Severity:  Severe Duration:  4 hours Timing:  Unable to specify Progression:  Unable to specify Relieved by:  Nothing Associated symptoms comment:  Unable to communicate Risk factors: obesity    Past Medical History  Diagnosis Date  . Right-sided heart failure   . COPD (chronic obstructive pulmonary disease)   . Emphysema   . Seizures   . Thyroid disease   . Depression   . Emphysema   . Arthritis     back and legs  . Blood transfusion without reported diagnosis   . High cholesterol   . Anxiety   . Nerves   . Panic attack   . Thyroid disorder    History reviewed. No pertinent past surgical history. History reviewed. No pertinent family history. History  Substance Use Topics  . Smoking status: Current Every Day Smoker  . Smokeless tobacco: Not on file  . Alcohol Use: Not on file   OB History   Grav Para Term Preterm Abortions TAB SAB Ect Mult Living                 Review of Systems  Unable to perform ROS: Severe respiratory distress  Respiratory: Positive for shortness of breath.     Allergies  Tape  Home Medications   Current Outpatient Rx  Name  Route  Sig  Dispense  Refill  . ALPRAZolam (XANAX) 0.5 MG tablet   Oral   Take 0.5 mg by mouth 2 (two) times daily as needed for anxiety.         . Calcium Carb-Cholecalciferol (CALCIUM 600 + D PO)   Oral   Take 1 tablet by mouth 2 (two) times daily.         Marland Kitchen diltiazem (TIAZAC) 300 MG 24 hr capsule   Oral   Take 300 mg by mouth daily.          . DULoxetine (CYMBALTA) 60 MG capsule   Oral   Take 60 mg by mouth 2 (two) times daily.         . ferrous sulfate 325 (65 FE) MG EC tablet   Oral   Take 325 mg by mouth daily.         . Fluticasone-Salmeterol (ADVAIR) 250-50 MCG/DOSE AEPB   Inhalation   Inhale 1 puff into the lungs every 12 (twelve) hours.         Marland Kitchen levothyroxine (SYNTHROID, LEVOTHROID) 150 MCG tablet   Oral   Take 150 mcg by mouth daily.         . Linaclotide (LINZESS) 145 MCG CAPS capsule   Oral   Take 145 mcg by mouth daily.         Marland Kitchen loratadine (CLARITIN) 10 MG tablet   Oral   Take 10 mg by mouth at bedtime.         . pantoprazole (PROTONIX) 40 MG tablet   Oral   Take 1 tablet (40 mg total) by mouth 2 (two) times daily.   60 tablet   2   . pregabalin (LYRICA) 100  MG capsule   Oral   Take 100 mg by mouth 3 (three) times daily.         Marland Kitchen tiotropium (SPIRIVA) 18 MCG inhalation capsule   Inhalation   Place 18 mcg into inhaler and inhale daily.         . traZODone (DESYREL) 150 MG tablet   Oral   Take 150 mg by mouth at bedtime.         . vitamin B-12 (CYANOCOBALAMIN) 1000 MCG tablet   Oral   Take 5,000 mcg by mouth daily.         Marland Kitchen amoxicillin-clavulanate (AUGMENTIN) 875-125 MG per tablet   Oral   Take 1 tablet by mouth 2 (two) times daily. Started 08/13/13, for 14 days   9 tablet   0   . doxycycline (VIBRA-TABS) 100 MG tablet   Oral   Take 1 tablet (100 mg total) by mouth 2 (two) times daily.   8 tablet   0   . nicotine (NICODERM CQ - DOSED IN MG/24 HOURS) 21 mg/24hr patch   Transdermal   Place 1 patch (21 mg total) onto the skin daily.   28 patch   0   . Oxycodone HCl 10 MG TABS   Oral   Take 0.5 tablets (5 mg total) by mouth every 6 (six) hours as needed (for pain).      0   . predniSONE (DELTASONE) 10 MG tablet      5 tabs for 3 days, then 4 tabs for 3 days, then 3 tabs for 3 days, then 2 tabs for 3 days, then 1 tab for 3 days.   45 tablet    0    BP 143/75  Pulse 92  Temp(Src) 99 F (37.2 C) (Oral)  Resp 18  Ht 5\' 2"  (1.575 m)  Wt 144 lb 9.6 oz (65.59 kg)  BMI 26.44 kg/m2  SpO2 94% Physical Exam  Constitutional: She appears well-developed and well-nourished. She appears listless. She appears toxic. She appears distressed. Face mask in place.  HENT:  Head: Normocephalic and atraumatic.  Eyes: Right eye exhibits no discharge. Left eye exhibits no discharge.  2mm pupils, reactive  Neck: Normal range of motion.  Cardiovascular: Normal rate, regular rhythm and normal heart sounds.   Pulmonary/Chest: Breath sounds normal.  Abdominal: Soft. She exhibits no distension. There is no tenderness.  Musculoskeletal: Normal range of motion.  Neurological: She appears listless. GCS eye subscore is 2. GCS verbal subscore is 1. GCS motor subscore is 4.  Skin: Skin is warm. She is not diaphoretic.    ED Course  INTUBATION Date/Time: 08/27/2013 2:58 PM Performed by: Imagene Sheller Authorized by: Joya Gaskins Consent: The procedure was performed in an emergent situation. Indications: respiratory distress Intubation method: video-assisted Patient status: paralyzed (RSI) Preoxygenation: nonrebreather mask Sedatives: etomidate Paralytic: succinylcholine Tube size: 7.5 mm Tube type: cuffed Number of attempts: 1 Cords visualized: yes Post-procedure assessment: chest rise,  ETCO2 monitor and CO2 detector Breath sounds: equal Cuff inflated: yes ETT to lip: 23 cm Chest x-ray findings: endotracheal tube in appropriate position Patient tolerance: Patient tolerated the procedure well with no immediate complications.   (including critical care time) Labs Review Labs Reviewed  RESPIRATORY VIRUS PANEL - Abnormal; Notable for the following:    Influenza A DETECTED (*)    Influenza A H3 DETECTED (*)    All other components within normal limits  CBC WITH DIFFERENTIAL - Abnormal; Notable for the following:    WBC  10.8 (*)    MCV  102.3 (*)    RDW 16.3 (*)    Neutrophils Relative % 82 (*)    Neutro Abs 8.9 (*)    All other components within normal limits  PRO B NATRIURETIC PEPTIDE - Abnormal; Notable for the following:    Pro B Natriuretic peptide (BNP) 736.3 (*)    All other components within normal limits  BASIC METABOLIC PANEL - Abnormal; Notable for the following:    Potassium 5.2 (*)    Glucose, Bld 145 (*)    BUN 42 (*)    Creatinine, Ser 1.58 (*)    GFR calc non Af Amer 37 (*)    GFR calc Af Amer 43 (*)    All other components within normal limits  URINALYSIS, ROUTINE W REFLEX MICROSCOPIC - Abnormal; Notable for the following:    Protein, ur 30 (*)    All other components within normal limits  OSMOLALITY - Abnormal; Notable for the following:    Osmolality 309 (*)    All other components within normal limits  URINE RAPID DRUG SCREEN (HOSP PERFORMED) - Abnormal; Notable for the following:    Benzodiazepines POSITIVE (*)    All other components within normal limits  MAGNESIUM - Abnormal; Notable for the following:    Magnesium 2.9 (*)    All other components within normal limits  PROTIME-INR - Abnormal; Notable for the following:    Prothrombin Time 11.4 (*)    All other components within normal limits  URINE MICROSCOPIC-ADD ON - Abnormal; Notable for the following:    Squamous Epithelial / LPF FEW (*)    Bacteria, UA FEW (*)    Casts GRANULAR CAST (*)    All other components within normal limits  CBC WITH DIFFERENTIAL - Abnormal; Notable for the following:    RDW 16.0 (*)    Neutrophils Relative % 94 (*)    Lymphocytes Relative 3 (*)    Neutro Abs 8.8 (*)    Lymphs Abs 0.3 (*)    All other components within normal limits  RENAL FUNCTION PANEL - Abnormal; Notable for the following:    Glucose, Bld 128 (*)    BUN 38 (*)    Creatinine, Ser 1.29 (*)    Calcium 8.1 (*)    Albumin 2.7 (*)    GFR calc non Af Amer 47 (*)    GFR calc Af Amer 55 (*)    All other components within normal limits   MAGNESIUM - Abnormal; Notable for the following:    Magnesium 2.7 (*)    All other components within normal limits  BLOOD GAS, ARTERIAL - Abnormal; Notable for the following:    pH, Arterial 7.340 (*)    pCO2 arterial 55.8 (*)    pO2, Arterial 193.0 (*)    Bicarbonate 29.3 (*)    Acid-Base Excess 3.9 (*)    All other components within normal limits  GLUCOSE, CAPILLARY - Abnormal; Notable for the following:    Glucose-Capillary 144 (*)    All other components within normal limits  GLUCOSE, CAPILLARY - Abnormal; Notable for the following:    Glucose-Capillary 126 (*)    All other components within normal limits  BLOOD GAS, ARTERIAL - Abnormal; Notable for the following:    Bicarbonate 28.5 (*)    Acid-Base Excess 4.5 (*)    All other components within normal limits  GLUCOSE, CAPILLARY - Abnormal; Notable for the following:    Glucose-Capillary 115 (*)  All other components within normal limits  GLUCOSE, CAPILLARY - Abnormal; Notable for the following:    Glucose-Capillary 114 (*)    All other components within normal limits  GLUCOSE, CAPILLARY - Abnormal; Notable for the following:    Glucose-Capillary 115 (*)    All other components within normal limits  CBC - Abnormal; Notable for the following:    RDW 16.4 (*)    All other components within normal limits  GLUCOSE, CAPILLARY - Abnormal; Notable for the following:    Glucose-Capillary 120 (*)    All other components within normal limits  RENAL FUNCTION PANEL - Abnormal; Notable for the following:    Potassium 3.4 (*)    Glucose, Bld 109 (*)    BUN 32 (*)    Calcium 8.0 (*)    Albumin 2.5 (*)    GFR calc non Af Amer 67 (*)    GFR calc Af Amer 78 (*)    All other components within normal limits  GLUCOSE, CAPILLARY - Abnormal; Notable for the following:    Glucose-Capillary 129 (*)    All other components within normal limits  GLUCOSE, CAPILLARY - Abnormal; Notable for the following:    Glucose-Capillary 101 (*)     All other components within normal limits  GLUCOSE, CAPILLARY - Abnormal; Notable for the following:    Glucose-Capillary 101 (*)    All other components within normal limits  GLUCOSE, CAPILLARY - Abnormal; Notable for the following:    Glucose-Capillary 114 (*)    All other components within normal limits  GLUCOSE, CAPILLARY - Abnormal; Notable for the following:    Glucose-Capillary 103 (*)    All other components within normal limits  RENAL FUNCTION PANEL - Abnormal; Notable for the following:    BUN 35 (*)    Albumin 2.8 (*)    GFR calc non Af Amer 76 (*)    GFR calc Af Amer 89 (*)    All other components within normal limits  CBC - Abnormal; Notable for the following:    RDW 16.4 (*)    All other components within normal limits  GLUCOSE, CAPILLARY - Abnormal; Notable for the following:    Glucose-Capillary 112 (*)    All other components within normal limits  GLUCOSE, CAPILLARY - Abnormal; Notable for the following:    Glucose-Capillary 118 (*)    All other components within normal limits  GLUCOSE, CAPILLARY - Abnormal; Notable for the following:    Glucose-Capillary 134 (*)    All other components within normal limits  GLUCOSE, CAPILLARY - Abnormal; Notable for the following:    Glucose-Capillary 142 (*)    All other components within normal limits  GLUCOSE, CAPILLARY - Abnormal; Notable for the following:    Glucose-Capillary 127 (*)    All other components within normal limits  GLUCOSE, CAPILLARY - Abnormal; Notable for the following:    Glucose-Capillary 111 (*)    All other components within normal limits  GLUCOSE, CAPILLARY - Abnormal; Notable for the following:    Glucose-Capillary 134 (*)    All other components within normal limits  GLUCOSE, CAPILLARY - Abnormal; Notable for the following:    Glucose-Capillary 122 (*)    All other components within normal limits  GLUCOSE, CAPILLARY - Abnormal; Notable for the following:    Glucose-Capillary 107 (*)    All  other components within normal limits  GLUCOSE, CAPILLARY - Abnormal; Notable for the following:    Glucose-Capillary 117 (*)    All  other components within normal limits  GLUCOSE, CAPILLARY - Abnormal; Notable for the following:    Glucose-Capillary 125 (*)    All other components within normal limits  GLUCOSE, CAPILLARY - Abnormal; Notable for the following:    Glucose-Capillary 167 (*)    All other components within normal limits  GLUCOSE, CAPILLARY - Abnormal; Notable for the following:    Glucose-Capillary 121 (*)    All other components within normal limits  GLUCOSE, CAPILLARY - Abnormal; Notable for the following:    Glucose-Capillary 116 (*)    All other components within normal limits  GLUCOSE, CAPILLARY - Abnormal; Notable for the following:    Glucose-Capillary 111 (*)    All other components within normal limits  GLUCOSE, CAPILLARY - Abnormal; Notable for the following:    Glucose-Capillary 145 (*)    All other components within normal limits  GLUCOSE, CAPILLARY - Abnormal; Notable for the following:    Glucose-Capillary 124 (*)    All other components within normal limits  GLUCOSE, CAPILLARY - Abnormal; Notable for the following:    Glucose-Capillary 155 (*)    All other components within normal limits  GLUCOSE, CAPILLARY - Abnormal; Notable for the following:    Glucose-Capillary 126 (*)    All other components within normal limits  GLUCOSE, CAPILLARY - Abnormal; Notable for the following:    Glucose-Capillary 121 (*)    All other components within normal limits  GLUCOSE, CAPILLARY - Abnormal; Notable for the following:    Glucose-Capillary 176 (*)    All other components within normal limits  POCT I-STAT 3, BLOOD GAS (G3+) - Abnormal; Notable for the following:    pH, Arterial 7.166 (*)    pCO2 arterial 92.4 (*)    pO2, Arterial 101.0 (*)    Bicarbonate 33.3 (*)    All other components within normal limits  POCT I-STAT 3, BLOOD GAS (G3+) - Abnormal; Notable  for the following:    pH, Arterial 7.241 (*)    pCO2 arterial 70.9 (*)    pO2, Arterial 76.0 (*)    Bicarbonate 30.4 (*)    All other components within normal limits  POCT I-STAT 3, BLOOD GAS (G3+) - Abnormal; Notable for the following:    pCO2 arterial 56.9 (*)    pO2, Arterial 73.0 (*)    Bicarbonate 31.5 (*)    Acid-Base Excess 4.0 (*)    All other components within normal limits  POCT I-STAT 3, BLOOD GAS (G3+) - Abnormal; Notable for the following:    pH, Arterial 7.549 (*)    pO2, Arterial 61.0 (*)    Bicarbonate 31.1 (*)    Acid-Base Excess 8.0 (*)    All other components within normal limits  CULTURE, RESPIRATORY (NON-EXPECTORATED)  CULTURE, BLOOD (ROUTINE X 2)  CULTURE, BLOOD (ROUTINE X 2)  MRSA PCR SCREENING  OSMOLALITY, URINE  LACTIC ACID, PLASMA  AMMONIA  TSH  APTT  MAGNESIUM  PATHOLOGIST SMEAR REVIEW  MAGNESIUM  GLUCOSE, CAPILLARY  GLUCOSE, CAPILLARY  CG4 I-STAT (LACTIC ACID)  POCT I-STAT TROPONIN I   Imaging Review No results found.  EKG Interpretation    Date/Time:  Saturday August 20 2013 19:40:17 EST Ventricular Rate:  109 PR Interval:  69 QRS Duration: 124 QT Interval:  349 QTC Calculation: 470 R Axis:   -84 Text Interpretation:  Sinus tachycardia Multiple ventricular premature complexes Nonspecific IVCD with LAD Inferior infarct, old ED PHYSICIAN INTERPRETATION AVAILABLE IN [facility] Confirmed by TEST, RECORD ([id number]) on 08/22/2013 11:10:48 AM  MDM   1. Respiratory failure   2. COPD exacerbation   3. Tobacco use disorder   4. Anxiety state, unspecified   5. Chronic airway obstruction, not elsewhere classified   6. Influenza   7. COPD with respiratory failure, acute   8. HYPERTRIGLYCERIDEMIA   9. ANXIETY   10. TOBACCO ABUSE   11. COPD   12. PULMONARY NODULE   13. GERD   14. LOW BACK PAIN   15. FIBROMYALGIA   16. SLEEP APNEA   17. ABDOMINAL PAIN   18. ABNORMAL RESULT, FUNCTION STUDY, LIVER   19. CERVICAL  CANCER, HX OF   20. HYSTERECTOMY, HX OF    Patient presented with respiratory distress. 52 yo F with CHF with respiratory distress via EMS. Spoke with son, who said that patient had been worsening throughout the day, but refusing to go to the hospital. Eventually patient became so somnolent, that EMS was called out. Patient being bagged upon arrival to ED.   PE as above, female in obvious respiratory distress, tight lung soungs, expiratory wheezing throughout. Patient noted to have narcotics on her medication list. Narcan given, with improvement in mentation, but with new agitation, uncooperation. Plan initially was to intubate, but given improvement, trial of BiPAP made. Patient not tolerating BiPAP. Decision made to intubate. Intubation performed as above, no complications.   Critical care physician consulted for admission to ICU. Patient in severe respiratory acidosis, with high CO2. Likely CO2 retaining with obesity, COPD, likely worsened 2/2 respiratory infection and narcotic usage.   Patient admitted to the ICU in critical but stable condition. Patient seen and evaluated by myself and my attending, Dr. Bebe Shaggy.      Imagene Sheller, MD 08/21/13 0200  Imagene Sheller, MD 08/27/13 1459

## 2013-08-20 NOTE — ED Notes (Signed)
Family states, "pt. Just d/c'd from [facility] last Saturday for PNA and Pulmonary Edema."

## 2013-08-20 NOTE — Progress Notes (Signed)
PHARMACIST - PHYSICIAN COMMUNICATION DR: Molli Knock CONCERNING:  Oseltamivir 30 mg capsule shortage  DESCRIPTION:  [place] is experiencing a significant shortage of Oseltamivir 30mg  capsules.    This patient has an order for Oseltamivir (Tamiflu) and has an Estimated Creatinine Clearance: 43.4 ml/min (by C-G formula based on Cr of 1.58).. The package insert for Oseltamivir recommends 30mg  BID x 5 days for patients with CrCl 30-60 ml/min.  To preserve an adequate supply of 30mg  capsules for our patients with more significant renal impairment the Infectious Disease team has recommended to substitute Oseltamivir 75mg  qday x 5 days for patients with CrCl 30-60 ml/min at this time.   RECOMMENDATION: Oseltamivir 75mg  PO DAILY x 5 days has been substituted for your patient. If you have any questions about this temporary substitution please feel free to call the Pharmacy at [phone] for assistance.  Dorna Leitz, PharmD, BCPS

## 2013-08-21 ENCOUNTER — Inpatient Hospital Stay (HOSPITAL_COMMUNITY): Payer: Medicare Other

## 2013-08-21 DIAGNOSIS — J9600 Acute respiratory failure, unspecified whether with hypoxia or hypercapnia: Principal | ICD-10-CM

## 2013-08-21 DIAGNOSIS — J441 Chronic obstructive pulmonary disease with (acute) exacerbation: Secondary | ICD-10-CM

## 2013-08-21 LAB — POCT I-STAT 3, ART BLOOD GAS (G3+)
Bicarbonate: 31.5 mEq/L — ABNORMAL HIGH (ref 20.0–24.0)
O2 Saturation: 93 %
pCO2 arterial: 56.9 mmHg — ABNORMAL HIGH (ref 35.0–45.0)
pH, Arterial: 7.35 (ref 7.350–7.450)
pO2, Arterial: 73 mmHg — ABNORMAL LOW (ref 80.0–100.0)

## 2013-08-21 LAB — BLOOD GAS, ARTERIAL
Drawn by: 2520311
FIO2: 0.4 %
MECHVT: 480 mL
O2 Saturation: 96.4 %
PEEP: 8 cmH2O
Patient temperature: 98.6
RATE: 30 resp/min

## 2013-08-21 LAB — RENAL FUNCTION PANEL
Albumin: 2.7 g/dL — ABNORMAL LOW (ref 3.5–5.2)
BUN: 38 mg/dL — ABNORMAL HIGH (ref 6–23)
CO2: 27 mEq/L (ref 19–32)
Calcium: 8.1 mg/dL — ABNORMAL LOW (ref 8.4–10.5)
Creatinine, Ser: 1.29 mg/dL — ABNORMAL HIGH (ref 0.50–1.10)
Phosphorus: 4.2 mg/dL (ref 2.3–4.6)
Potassium: 4 mEq/L (ref 3.5–5.1)

## 2013-08-21 LAB — RESPIRATORY VIRUS PANEL
Adenovirus: NOT DETECTED
Influenza A H1: NOT DETECTED
Influenza A: DETECTED — AB
Metapneumovirus: NOT DETECTED
Parainfluenza 1: NOT DETECTED
Parainfluenza 2: NOT DETECTED
Parainfluenza 3: NOT DETECTED
Respiratory Syncytial Virus A: NOT DETECTED

## 2013-08-21 LAB — APTT: aPTT: 24 seconds (ref 24–37)

## 2013-08-21 LAB — CBC WITH DIFFERENTIAL/PLATELET
Basophils Absolute: 0 10*3/uL (ref 0.0–0.1)
Eosinophils Absolute: 0 10*3/uL (ref 0.0–0.7)
Lymphocytes Relative: 3 % — ABNORMAL LOW (ref 12–46)
Lymphs Abs: 0.3 10*3/uL — ABNORMAL LOW (ref 0.7–4.0)
MCH: 31.7 pg (ref 26.0–34.0)
MCV: 99 fL (ref 78.0–100.0)
Monocytes Absolute: 0.3 10*3/uL (ref 0.1–1.0)
Neutro Abs: 8.8 10*3/uL — ABNORMAL HIGH (ref 1.7–7.7)
Platelets: 158 10*3/uL (ref 150–400)
RDW: 16 % — ABNORMAL HIGH (ref 11.5–15.5)

## 2013-08-21 LAB — RAPID URINE DRUG SCREEN, HOSP PERFORMED
Amphetamines: NOT DETECTED
Cocaine: NOT DETECTED
Opiates: NOT DETECTED
Tetrahydrocannabinol: NOT DETECTED

## 2013-08-21 LAB — MRSA PCR SCREENING: MRSA by PCR: NEGATIVE

## 2013-08-21 LAB — GLUCOSE, CAPILLARY
Glucose-Capillary: 114 mg/dL — ABNORMAL HIGH (ref 70–99)
Glucose-Capillary: 115 mg/dL — ABNORMAL HIGH (ref 70–99)
Glucose-Capillary: 120 mg/dL — ABNORMAL HIGH (ref 70–99)
Glucose-Capillary: 126 mg/dL — ABNORMAL HIGH (ref 70–99)
Glucose-Capillary: 144 mg/dL — ABNORMAL HIGH (ref 70–99)

## 2013-08-21 LAB — OSMOLALITY: Osmolality: 309 mOsm/kg — ABNORMAL HIGH (ref 275–300)

## 2013-08-21 LAB — MAGNESIUM: Magnesium: 2.7 mg/dL — ABNORMAL HIGH (ref 1.5–2.5)

## 2013-08-21 LAB — PROTIME-INR
INR: 0.84 (ref 0.00–1.49)
Prothrombin Time: 11.4 seconds — ABNORMAL LOW (ref 11.6–15.2)

## 2013-08-21 MED ORDER — NALOXONE HCL 1 MG/ML IJ SOLN
INTRAMUSCULAR | Status: AC
  Filled 2013-08-21: qty 2

## 2013-08-21 MED ORDER — FENTANYL CITRATE 0.05 MG/ML IJ SOLN: 50.0000 ug | Freq: Once | INTRAMUSCULAR | Status: DC

## 2013-08-21 MED ORDER — SODIUM CHLORIDE 0.9 % IV SOLN
0.0000 ug/h | INTRAVENOUS | Status: DC
  Administered 2013-08-21: 50 ug/h via INTRAVENOUS
  Filled 2013-08-21: qty 50

## 2013-08-21 MED ORDER — FENTANYL CITRATE 0.05 MG/ML IJ SOLN
50.0000 ug | INTRAMUSCULAR | Status: DC | PRN
  Administered 2013-08-21 (×2): 50 ug via INTRAVENOUS

## 2013-08-21 MED ORDER — DOCUSATE SODIUM 100 MG PO CAPS
100.0000 mg | ORAL_CAPSULE | Freq: Two times a day (BID) | ORAL | Status: DC
  Administered 2013-08-22 – 2013-08-26 (×8): 100 mg via ORAL
  Filled 2013-08-21 (×12): qty 1

## 2013-08-21 MED ORDER — OSELTAMIVIR PHOSPHATE 30 MG PO CAPS
30.0000 mg | ORAL_CAPSULE | Freq: Two times a day (BID) | ORAL | Status: DC
  Administered 2013-08-22: 30 mg via ORAL
  Filled 2013-08-21 (×2): qty 1

## 2013-08-21 MED ORDER — OXYCODONE HCL 5 MG PO TABS
5.0000 mg | ORAL_TABLET | Freq: Four times a day (QID) | ORAL | Status: DC | PRN
  Administered 2013-08-22 – 2013-08-26 (×14): 5 mg via ORAL
  Filled 2013-08-21 (×14): qty 1

## 2013-08-21 MED ORDER — FENTANYL BOLUS VIA INFUSION
50.0000 ug | INTRAVENOUS | Status: DC | PRN
  Filled 2013-08-21: qty 100

## 2013-08-21 MED ORDER — FENTANYL CITRATE 0.05 MG/ML IJ SOLN
50.0000 ug | INTRAMUSCULAR | Status: DC | PRN
  Administered 2013-08-21: 50 ug via INTRAVENOUS
  Filled 2013-08-21 (×2): qty 2

## 2013-08-21 MED ORDER — OSELTAMIVIR PHOSPHATE 75 MG PO CAPS
75.0000 mg | ORAL_CAPSULE | Freq: Every day | ORAL | Status: DC
  Administered 2013-08-21: 75 mg via ORAL
  Filled 2013-08-21: qty 1

## 2013-08-21 MED ORDER — OXYCODONE HCL 5 MG/5ML PO SOLN
5.0000 mg | Freq: Four times a day (QID) | ORAL | Status: DC | PRN
  Administered 2013-08-21: 5 mg via ORAL
  Filled 2013-08-21: qty 5

## 2013-08-21 NOTE — Procedures (Signed)
Extubation Procedure Note  Patient Details:   Name: [name] DOB: 1961/08/19 MRN: [id number]   Airway Documentation:     Evaluation  O2 sats: stable throughout Complications: No apparent complications Patient did tolerate procedure well. Bilateral Breath Sounds: Diminished;Other (Comment) (coarse) Suctioning: Oral;Airway Yes  Pt tolerated wean, positive for cuff leak, extubated to 4lpm [place]. No stridor or dyspnea noted after extubation. Pt achieved x2 and x1 on IS. Pt resting comfortably at this time, all vitals are within normal limits. RT Will continue to monitor.   Beatris Si 08/21/2013, 10:49 AM

## 2013-08-21 NOTE — Progress Notes (Signed)
PULMONARY  / CRITICAL CARE MEDICINE  Name: [name] MRN: [id number] DOB: Aug 18, 1961    ADMISSION DATE:  08/20/2013 CONSULTATION DATE:  2/27  REFERRING MD :  ED PRIMARY SERVICE: Pulm/CCM  CHIEF COMPLAINT:  COPD exacerbation  BRIEF PATIENT DESCRIPTION: 52 yo with recent hospitalization to [place] for pneumonia who presented with AMS.  Patient comatose at arrival, received narcan with abrupt awakening with noted severe agitation.  Patient with wheezing and heavy work of breathing.  Intubated for respiratory failure and agitation.  Following intubation for hypercapneic respiratory failure, large amount of purulent material suctioned out of ET tube.   Multiple hospitalizations in the last year, the last of which was 1 week ago (dx with copd and pna) and chronic medical problems including severe COPD (on home o2, continues to smoke heavily), chronic pain, CHF (HFpEF, likely diasostic vs RV dysfunction due to COPD with history of pulmonary edema and le edema), PE earlier this year in January 2014 on no home anticoagulation, and hypothyroidism.  Baseline CO2 is around 40 (probably higher as indicated by alkalosis on abg).  SIGNIFICANT EVENTS / STUDIES:  12/27 agitation, purulent secretions  LINES / TUBES: ET tube in place  CULTURES: Blood Sputum Urine  ANTIBIOTICS: Cefepime 2 GM bid (due to recent abx exposure, severe copd exacerbation, recent hospitalization) >> Doxycycline 100 mg bid (no azithromycin due to qt c ) >> Tamiflu due to recent exposure to URI from husband and significant COPD exacerbation >>     SUBJECTIVE: afebrile Denies pain On low dose propofol & fent gtt  VITAL SIGNS: Temp:  [97 F (36.1 C)-98.8 F (37.1 C)] 97.9 F (36.6 C) (12/28 0100) Pulse Rate:  [73-106] 82 (12/28 0823) Resp:  [0-30] 15 (12/28 0823) BP: (95-162)/(37-96) 116/67 mmHg (12/28 0823) SpO2:  [94 %-100 %] 97 % (12/28 0823) FiO2 (%):  [40 %-60 %] 40 % (12/28 0823) Weight:  [72.2 kg (159  lb 2.8 oz)-73 kg (160 lb 15 oz)] 73 kg (160 lb 15 oz) (12/28 0457) HEMODYNAMICS:   VENTILATOR SETTINGS: Vent Mode:  [-] PSV FiO2 (%):  [40 %-60 %] 40 % Set Rate:  [25 bmp-30 bmp] 30 bmp Vt Set:  [450 mL-480 mL] 480 mL PEEP:  [5 cmH20] 5 cmH20 Pressure Support:  [5 cmH20] 5 cmH20 Plateau Pressure:  [20 cmH20-22 cmH20] 20 cmH20 INTAKE / OUTPUT: Intake/Output     12/27 0701 - 12/28 0700 12/28 0701 - 12/29 0700   I.V. (mL/kg) 669.4 (9.2)    NG/GT 111    IV Piggyback 50    Total Intake(mL/kg) 830.4 (11.4)    Urine (mL/kg/hr) 1360    Emesis/NG output 300    Other 600    Total Output 2260     Net -1429.6            General: VSS sedated obese female, non toxic, no diaphoresis, stable hemodynamics Head: Eyes sluggish pupillary light reflex. Normal cephalic and atramatic.  No icterus.  Noted scleral edema.  Oral mucosa moist with frothy sputum Lungs: bilateral rhonchi decreased.  Unable to appreciate any rales.  Fair air movment.  Air trapping with Auto peep Heart: HRRR S1 S2 Pulses are 2+ & equal, no MRG noted on exam. Abdomen: No distention.  Bowel sounds are positive, abdomen soft and non-tender without masses, no hepatosplenomegaly Extremities: No clubbing, cyanosis or edema. DP +2 Neuro: lethargic on propofol   LABS:  Recent Labs Lab 08/20/13 1926  08/20/13 2022 08/20/13 2024 08/20/13 2106 08/20/13 2300 08/21/13 0015  08/21/13 0350 08/21/13 0655  HGB 13.7  --   --   --   --   --   --  13.3  --   WBC 10.8*  --   --   --   --   --   --  9.4  --   PLT 192  --   --   --   --   --   --  158  --   NA 136  --   --   --   --   --   --  137  --   K 5.2*  --   --   --   --   --   --  4.0  --   CL 98  --   --   --   --   --   --  99  --   CO2 29  --   --   --   --   --   --  27  --   GLUCOSE 145*  --   --   --   --   --   --  128*  --   BUN 42*  --   --   --   --   --   --  38*  --   CREATININE 1.58*  --   --   --   --   --   --  1.29*  --   CALCIUM 8.4  --   --   --   --   --    --  8.1*  --   MG  --   --   --   --   --   --  2.9* 2.7*  --   PHOS  --   --   --   --   --   --   --  4.2  --   ALBUMIN  --   --   --   --   --   --   --  2.7*  --   APTT  --   --   --   --   --   --  24  --   --   INR  --   --   --   --   --   --  0.84  --   --   LATICACIDVEN  --   --  1.28 0.9  --   --   --   --   --   PROBNP 736.3*  --   --   --   --   --   --   --   --   PHART  --   < >  --   --  7.241* 7.340*  --   --  7.443  PCO2ART  --   < >  --   --  70.9* 55.8*  --   --  42.4  PO2ART  --   < >  --   --  76.0* 193.0*  --   --  82.0  < > = values in this interval not displayed.  Recent Labs Lab 08/20/13 2347 08/21/13 0401 08/21/13 0728  GLUCAP 144* 126* 115*    CXR: Initial right mainstem intubation.  Noted increased vascular marking and cephalization.  No pulmonary edema or infiltrate noted  EKG pending  ASSESSMENT / PLAN:  PULMONARY A:  Hypercapnic respiratory Failure Multifactorial; COPD exacerbation, oversedation with  opiates Patient with recent hospitalization, recent antibiotic exposure, acute decompensation with AMS and severe acidosis, profuse purulent sputum production, recent contact with husband who has URI (patient has received flu shot, however N3H1 strain not covered and currently in circulation) P:   Antibiotic coverage with cefepime (risk for pseudomonas with above risk factors) and doxy (avoiding azithromycin due to hx of prolonged qtc hx and cymbalta use).  No infiltrates, no CAP Tamiflu until negative flu screen, pending viral panel Methylprednisolone  60 mg Q6 Duoneb breathing treatments Q6 May need diuresis, however no pulmonary edema noted; see below Placed on propofol gtt for sedation with around the clock narcotics to avoid withdraw symptoms but at half dose to avoid oversedation in critical illness. FiO2 to SpO2 >88% SBTs with goal extubation if good ABG on PS 5/5  CARDIOVASCULAR A: CHF with HFpEF Noted scleral edema, clear cxr, and history  of le edema. History of RV dysfunction P:  Optimize hypoxia May need diuresis with signs of overload, however will allow autodiuresis and follow for now as patient stable without significantly increased o2 requirments or RV failure Holding CCB for now  RENAL A:  Hyperkalemia  Long standing CKD III reviewed medications without preciptating factors Possibly due to significant acidosis  P:   Monitor and treat primary condition  A:  CKDII  Possible AKI on top of CKD AKI Acute on chronic renal failure Multifactorial Pre-Renal/Shock/Septic Nephropathy/Acute interstitial nephritis  P: Foley for strict I/O's Avoid nephrotoxic substances Renally dose medications   GASTROINTESTINAL A:  GI prophylaxis  P:   Protonix IV  A:  Diet P: Dc TFs since extubation planned, resume if not extubated  HEMATOLOGIC A:  DVT prophylaxis History of PE with non occlusive subsegmental pe in left lower lobes (january of this year). No home anticoagulation currently P:  Heparin tid  INFECTIOUS A:  COPD exacerbation No indication for pneumonia, no frank infiltrates P:   See above COPD exacerbation antibiotic coverage with cefepime (risk for pseudomonas with above risk factors) and doxy (avoiding azithromycin due to hx of prolonged qtc hx and cymbalta use).  No infiltrates, no CAP Tamiflu until negative flu screen, pending viral panel   ENDOCRINE A:  Hyperglycemia   Patient started on steroids with high risk of hyperglycemia P:   Goal Glucose in critically ill 140-180 SSI ICU protocol initiated   NEUROLOGIC A:  AMS Multifactorial, likely due to hypercapnia and oversedation due to narcotic use.  Head CT neg  P:  Dc propofol & fent gtt on extubation   I have personally obtained a history, examined the patient, evaluated laboratory and imaging results, formulated the assessment and plan and placed orders. CRITICAL CARE: The patient is critically ill with multiple organ systems failure  and requires high complexity decision making for assessment and support, frequent evaluation and titration of therapies, application of advanced monitoring technologies and extensive interpretation of multiple databases. Critical Care Time devoted to patient care services described in this note is 40 minutes.   Oretha Milch  Pulmonary and Critical Care Medicine [facility] Pager: [phone]  08/21/2013, 8:48 AM

## 2013-08-21 NOTE — Progress Notes (Signed)
Pt given a total of three doses of Fentanyl 50 mcg each. Pt continues to become very agitated and restless with any activity such as turning, suctioning, and performing oral care. She starts coughing and fighting the ventilator. Will initiate phase 2 of the pain, agitation, delirium protocol and start a Fentanyl drip per previous order. Will continue to monitor and assess.  Alfonso Ellis, RN

## 2013-08-21 NOTE — Progress Notes (Addendum)
200 mL of Fentanyl gtt wasted in sink. Witnessed by Devona Konig, RN.  Carlos Levering, RN

## 2013-08-22 ENCOUNTER — Inpatient Hospital Stay (HOSPITAL_COMMUNITY): Payer: Medicare Other

## 2013-08-22 DIAGNOSIS — F17200 Nicotine dependence, unspecified, uncomplicated: Secondary | ICD-10-CM

## 2013-08-22 LAB — GLUCOSE, CAPILLARY
Glucose-Capillary: 101 mg/dL — ABNORMAL HIGH (ref 70–99)
Glucose-Capillary: 114 mg/dL — ABNORMAL HIGH (ref 70–99)
Glucose-Capillary: 103 mg/dL — ABNORMAL HIGH (ref 70–99)
Glucose-Capillary: 112 mg/dL — ABNORMAL HIGH (ref 70–99)

## 2013-08-22 LAB — CBC
Hemoglobin: 12.8 g/dL (ref 12.0–15.0)
MCH: 31.7 pg (ref 26.0–34.0)
MCHC: 32.8 g/dL (ref 30.0–36.0)
Platelets: 175 10*3/uL (ref 150–400)
RDW: 16.4 % — ABNORMAL HIGH (ref 11.5–15.5)
WBC: 7.7 10*3/uL (ref 4.0–10.5)

## 2013-08-22 LAB — RENAL FUNCTION PANEL
Albumin: 2.5 g/dL — ABNORMAL LOW (ref 3.5–5.2)
BUN: 32 mg/dL — ABNORMAL HIGH (ref 6–23)
Creatinine, Ser: 0.97 mg/dL (ref 0.50–1.10)
Glucose, Bld: 109 mg/dL — ABNORMAL HIGH (ref 70–99)
Phosphorus: 4 mg/dL (ref 2.3–4.6)
Potassium: 3.4 mEq/L — ABNORMAL LOW (ref 3.5–5.1)

## 2013-08-22 LAB — CULTURE, RESPIRATORY W GRAM STAIN

## 2013-08-22 LAB — POCT I-STAT 3, ART BLOOD GAS (G3+)
Bicarbonate: 31.1 mEq/L — ABNORMAL HIGH (ref 20.0–24.0)
TCO2: 32 mmol/L (ref 0–100)
pCO2 arterial: 35.7 mmHg (ref 35.0–45.0)
pH, Arterial: 7.549 — ABNORMAL HIGH (ref 7.350–7.450)

## 2013-08-22 LAB — MAGNESIUM: Magnesium: 2.5 mg/dL (ref 1.5–2.5)

## 2013-08-22 LAB — PATHOLOGIST SMEAR REVIEW: Path Review: REACTIVE

## 2013-08-22 MED ORDER — MOMETASONE FURO-FORMOTEROL FUM 100-5 MCG/ACT IN AERO
2.0000 | INHALATION_SPRAY | Freq: Two times a day (BID) | RESPIRATORY_TRACT | Status: DC
  Administered 2013-08-22 – 2013-08-26 (×9): 2 via RESPIRATORY_TRACT
  Filled 2013-08-22 (×2): qty 8.8

## 2013-08-22 MED ORDER — DILTIAZEM HCL ER BEADS 300 MG PO CP24
300.0000 mg | ORAL_CAPSULE | Freq: Every day | ORAL | Status: DC
  Administered 2013-08-22 – 2013-08-26 (×5): 300 mg via ORAL
  Filled 2013-08-22 (×6): qty 1

## 2013-08-22 MED ORDER — POTASSIUM CHLORIDE CRYS ER 20 MEQ PO TBCR
40.0000 meq | EXTENDED_RELEASE_TABLET | Freq: Once | ORAL | Status: AC
  Administered 2013-08-22: 40 meq via ORAL
  Filled 2013-08-22: qty 2

## 2013-08-22 MED ORDER — TIOTROPIUM BROMIDE MONOHYDRATE 18 MCG IN CAPS
18.0000 ug | ORAL_CAPSULE | Freq: Every day | RESPIRATORY_TRACT | Status: DC
  Administered 2013-08-22 – 2013-08-26 (×5): 18 ug via RESPIRATORY_TRACT
  Filled 2013-08-22 (×2): qty 5

## 2013-08-22 MED ORDER — METHYLPREDNISOLONE SODIUM SUCC 125 MG IJ SOLR
60.0000 mg | Freq: Two times a day (BID) | INTRAMUSCULAR | Status: DC
  Administered 2013-08-22 – 2013-08-24 (×4): 60 mg via INTRAVENOUS
  Filled 2013-08-22 (×3): qty 0.96
  Filled 2013-08-22: qty 2
  Filled 2013-08-22 (×4): qty 0.96

## 2013-08-22 MED ORDER — PANTOPRAZOLE SODIUM 40 MG PO TBEC
40.0000 mg | DELAYED_RELEASE_TABLET | Freq: Two times a day (BID) | ORAL | Status: DC
  Administered 2013-08-22 – 2013-08-26 (×8): 40 mg via ORAL
  Filled 2013-08-22 (×8): qty 1

## 2013-08-22 MED ORDER — DULOXETINE HCL 60 MG PO CPEP
60.0000 mg | ORAL_CAPSULE | Freq: Two times a day (BID) | ORAL | Status: DC
  Administered 2013-08-22 – 2013-08-26 (×9): 60 mg via ORAL
  Filled 2013-08-22 (×11): qty 1

## 2013-08-22 MED ORDER — BOOST / RESOURCE BREEZE PO LIQD
1.0000 | Freq: Three times a day (TID) | ORAL | Status: DC
  Administered 2013-08-22 – 2013-08-23 (×4): 1 via ORAL

## 2013-08-22 MED ORDER — OSELTAMIVIR PHOSPHATE 75 MG PO CAPS
75.0000 mg | ORAL_CAPSULE | Freq: Two times a day (BID) | ORAL | Status: DC
  Administered 2013-08-22 – 2013-08-26 (×8): 75 mg via ORAL
  Filled 2013-08-22 (×9): qty 1

## 2013-08-22 MED ORDER — NICOTINE 21 MG/24HR TD PT24
21.0000 mg | MEDICATED_PATCH | Freq: Every day | TRANSDERMAL | Status: DC
  Administered 2013-08-22 – 2013-08-26 (×5): 21 mg via TRANSDERMAL
  Filled 2013-08-22 (×5): qty 1

## 2013-08-22 MED ORDER — ALPRAZOLAM 0.5 MG PO TABS
0.5000 mg | ORAL_TABLET | Freq: Two times a day (BID) | ORAL | Status: DC | PRN
  Administered 2013-08-22 – 2013-08-26 (×9): 0.5 mg via ORAL
  Filled 2013-08-22 (×10): qty 1

## 2013-08-22 MED ORDER — DEXTROSE 5 % IV SOLN
2.0000 g | Freq: Two times a day (BID) | INTRAVENOUS | Status: DC
  Administered 2013-08-22 – 2013-08-26 (×9): 2 g via INTRAVENOUS
  Filled 2013-08-22 (×10): qty 2

## 2013-08-22 MED ORDER — OXYCODONE HCL 10 MG PO TABS: 5.0000 mg | ORAL_TABLET | Freq: Three times a day (TID) | ORAL | Status: DC | PRN

## 2013-08-22 NOTE — Progress Notes (Signed)
ANTIBIOTIC CONSULT NOTE   Pharmacy Consult for Pharmacy may adjust antibiotics for renal function Indication: pneumonia  Allergies  Allergen Reactions  . Tape Itching and Rash    Patient Measurements: Height: 5\' 6"  (167.6 cm) Weight: 155 lb 4.8 oz (70.444 kg) IBW/kg (Calculated) : 59.3   Vital Signs: Temp: 98.6 F (37 C) (12/29 0737) Temp src: Core (Comment) (12/29 0737) BP: 161/95 mmHg (12/29 0900) Pulse Rate: 96 (12/29 0900) Intake/Output from previous day: 12/28 0701 - 12/29 0700 In: 495.9 [P.O.:240; I.V.:65.9; NG/GT:140; IV Piggyback:50] Out: 2545 [Urine:2545] Intake/Output from this shift: Total I/O In: -  Out: 301 [Urine:300; Stool:1]  Labs:  Recent Labs  08/20/13 1926 08/21/13 0350 08/22/13 0415  WBC 10.8* 9.4 7.7  HGB 13.7 13.3 12.8  PLT 192 158 175  CREATININE 1.58* 1.29* 0.97   Estimated Creatinine Clearance: 65 ml/min (by C-G formula based on Cr of 0.97). No results found for this basename: VANCOTROUGH, Leodis Binet, VANCORANDOM, GENTTROUGH, GENTPEAK, GENTRANDOM, TOBRATROUGH, TOBRAPEAK, TOBRARND, AMIKACINPEAK, AMIKACINTROU, AMIKACIN,  in the last 72 hours   Microbiology: Recent Results (from the past 720 hour(s))  CULTURE, RESPIRATORY (NON-EXPECTORATED)     Status: None   Collection Time    08/20/13  8:22 PM      Result Value Range Status   Specimen Description TRACHEAL ASPIRATE   Final   Special Requests NONE   Final   Gram Stain     Final   Value: MODERATE WBC PRESENT,BOTH PMN AND MONONUCLEAR     RARE SQUAMOUS EPITHELIAL CELLS PRESENT     RARE YEAST     Performed at [facility]   Culture     Final   Value: Culture reincubated for better growth     Performed at [facility]   Report Status PENDING   Incomplete  MRSA PCR SCREENING     Status: None   Collection Time    08/20/13 10:59 PM      Result Value Range Status   MRSA by PCR NEGATIVE  NEGATIVE Final   Comment:            The GeneXpert MRSA Assay (FDA     approved for  NASAL specimens     only), is one component of a     comprehensive MRSA colonization     surveillance program. It is not     intended to diagnose MRSA     infection nor to guide or     monitor treatment for     MRSA infections.  CULTURE, BLOOD (ROUTINE X 2)     Status: None   Collection Time    08/21/13 12:10 AM      Result Value Range Status   Specimen Description BLOOD LEFT HAND   Final   Special Requests BOTTLES DRAWN AEROBIC ONLY 4CC   Final   Culture  Setup Time     Final   Value: 08/21/2013 14:18     Performed at [facility]   Culture     Final   Value:        BLOOD CULTURE RECEIVED NO GROWTH TO DATE CULTURE WILL BE HELD FOR 5 DAYS BEFORE ISSUING A FINAL NEGATIVE REPORT     Performed at [facility]   Report Status PENDING   Incomplete  CULTURE, BLOOD (ROUTINE X 2)     Status: None   Collection Time    08/21/13 12:15 AM      Result Value Range Status   Specimen  Description BLOOD RIGHT ANTECUBITAL   Final   Special Requests BOTTLES DRAWN AEROBIC ONLY 10CC   Final   Culture  Setup Time     Final   Value: 08/21/2013 14:19     Performed at [facility]   Culture     Final   Value:        BLOOD CULTURE RECEIVED NO GROWTH TO DATE CULTURE WILL BE HELD FOR 5 DAYS BEFORE ISSUING A FINAL NEGATIVE REPORT     Performed at [facility]   Report Status PENDING   Incomplete  RESPIRATORY VIRUS PANEL     Status: Abnormal   Collection Time    08/21/13  1:19 AM      Result Value Range Status   Source - RVPAN NASAL SWAB   Corrected   Comment: CORRECTED ON 12/28 AT 2319: PREVIOUSLY REPORTED AS NASAL SWAB   Respiratory Syncytial Virus A NOT DETECTED   Final   Respiratory Syncytial Virus B NOT DETECTED   Final   Influenza A DETECTED (*)  Final   Influenza B NOT DETECTED   Final   Parainfluenza 1 NOT DETECTED   Final   Parainfluenza 2 NOT DETECTED   Final   Parainfluenza 3 NOT DETECTED   Final   Metapneumovirus NOT DETECTED   Final   Rhinovirus NOT  DETECTED   Final   Adenovirus NOT DETECTED   Final   Influenza A H1 NOT DETECTED   Final   Influenza A H3 DETECTED (*)  Final   Comment: (NOTE)           Normal Reference Range for each Analyte: NOT DETECTED     Testing performed using the Luminex xTAG Respiratory Viral Panel test     kit.     This test was developed and its performance characteristics determined     by [facility]. It has not been cleared or approved by the [place]     Food and Drug Administration. This test is used for clinical purposes.     It should not be regarded as investigational or for research. This     laboratory is certified under the Clinical Laboratory Improvement     Amendments of 1988 (CLIA) as qualified to perform high complexity     clinical laboratory testing.     Performed at [facility] History: Past Medical History  Diagnosis Date  . Right-sided heart failure   . COPD (chronic obstructive pulmonary disease)   . Emphysema   . Seizures   . Thyroid disease   . Depression   . Emphysema   . Arthritis     back and legs  . Blood transfusion without reported diagnosis   . High cholesterol   . Anxiety   . Nerves   . Panic attack   . Thyroid disorder     Medications:  Scheduled:  . albuterol  2.5 mg Nebulization Q4H   And  . ipratropium  0.5 mg Nebulization Q4H  . antiseptic oral rinse  15 mL Mouth Rinse QID  . ceFEPime (MAXIPIME) IV  2 g Intravenous Q12H  . chlorhexidine  15 mL Mouth Rinse BID  . docusate sodium  100 mg Oral BID  . heparin  5,000 Units Subcutaneous Q8H  . insulin aspart  2-6 Units Subcutaneous Q4H  . levothyroxine  150 mcg Oral QAC breakfast  . methylPREDNISolone (SOLU-MEDROL) injection  60 mg Intravenous Q6H  . oseltamivir  75 mg Oral  BID  . pantoprazole (PROTONIX) IV  40 mg Intravenous Daily   Assessment: 52 yo lady with improving renal function on tamiflu and cefepime.    Goal of Therapy:  Eradication of infection  Plan:  Change  cefepime to 2gm IV q12 hours Change tamiflu to 75 mg po bid.  [name] 08/22/2013,10:44 AM

## 2013-08-22 NOTE — Progress Notes (Signed)
Report received from RN on 2S for transfer to 5W11.

## 2013-08-22 NOTE — Progress Notes (Signed)
INITIAL NUTRITION ASSESSMENT  DOCUMENTATION CODES Per approved criteria  -Not Applicable   INTERVENTION: Resource Breeze po TID, each supplement provides 250 kcal and 9 grams of protein RD to follow for nutrition care plan  NUTRITION DIAGNOSIS: Increased nutrient needs related to acute illness as evidenced by estimated nutrition needs   Goal: Pt to meet >/= 90% of their estimated nutrition needs   Monitor:  PO & supplemental intake, weight, labs, I/O's  Reason for Assessment: Malnutrition Screening Tool Report  52 y.o. female  Admitting Dx: COPD exacerbation  ASSESSMENT: Patient with PMH of COPD, on home O2, smoking; recent hospitalization to [facility] for [facility] who presented with AMS; + wheezing and heavy work of breathing; intubated for respiratory failure and agitation.  Patient extubated this AM.  Patient states she was eating fine PTA, however, per H&P patient's with poor PO intake over the last week with increased somnolence, ataxia and progressive confusion; positive for Influenza A H3; patient at nutrition risk and would benefit from addition of nutrition supplements -- RD to order.  Height: Ht Readings from Last 1 Encounters:  08/20/13 5\' 6"  (1.676 m)    Weight: Wt Readings from Last 1 Encounters:  08/22/13 155 lb 4.8 oz (70.444 kg)    Ideal Body Weight: 130 lb  % Ideal Body Weight: 119%  Wt Readings from Last 10 Encounters:  08/22/13 155 lb 4.8 oz (70.444 kg)  08/15/13 152 lb 6.4 oz (69.128 kg)    Usual Body Weight: 152 lb  % Usual Body Weight: 102%  BMI:  Body mass index is 25.08 kg/(m^2).  Estimated Nutritional Needs: Kcal: 1750-1950 Protein: 80-90 gm Fluid: 1.7-1.9 L  Skin: sacral abrasion  Diet Order: Cardiac  EDUCATION NEEDS: -No education needs identified at this time   Intake/Output Summary (Last 24 hours) at 08/22/13 1306 Last data filed at 08/22/13 1200  Gross per 24 hour  Intake    300 ml  Output   2512 ml  Net  -2212 ml     Labs:   Recent Labs Lab 08/20/13 1926 08/21/13 0015 08/21/13 0350 08/22/13 0415  NA 136  --  137 139  K 5.2*  --  4.0 3.4*  CL 98  --  99 102  CO2 29  --  27 29  BUN 42*  --  38* 32*  CREATININE 1.58*  --  1.29* 0.97  CALCIUM 8.4  --  8.1* 8.0*  MG  --  2.9* 2.7* 2.5  PHOS  --   --  4.2 4.0  GLUCOSE 145*  --  128* 109*    CBG (last 3)   Recent Labs  08/22/13 0348 08/22/13 0740 08/22/13 1205  GLUCAP 101* 101* 114*    Scheduled Meds: . albuterol  2.5 mg Nebulization Q4H  . antiseptic oral rinse  15 mL Mouth Rinse QID  . ceFEPime (MAXIPIME) IV  2 g Intravenous Q12H  . chlorhexidine  15 mL Mouth Rinse BID  . docusate sodium  100 mg Oral BID  . DULoxetine  60 mg Oral BID  . heparin  5,000 Units Subcutaneous Q8H  . insulin aspart  2-6 Units Subcutaneous Q4H  . levothyroxine  150 mcg Oral QAC breakfast  . methylPREDNISolone (SOLU-MEDROL) injection  60 mg Intravenous Q12H  . mometasone-formoterol  2 puff Inhalation BID  . nicotine  21 mg Transdermal Daily  . oseltamivir  75 mg Oral BID  . pantoprazole  40 mg Oral BID  . tiotropium  18 mcg Inhalation Daily  Continuous Infusions:   Past Medical History  Diagnosis Date  . Right-sided heart failure   . COPD (chronic obstructive pulmonary disease)   . Emphysema   . Seizures   . Thyroid disease   . Depression   . Emphysema   . Arthritis     back and legs  . Blood transfusion without reported diagnosis   . High cholesterol   . Anxiety   . Nerves   . Panic attack   . Thyroid disorder     History reviewed. No pertinent past surgical history.  Maureen Chatters, RD, LDN Pager #: [phone] After-Hours Pager #: [phone]

## 2013-08-22 NOTE — Progress Notes (Signed)
NURSING PROGRESS NOTE  [name] [id number] Transfer Data: 08/22/2013 4:19 PM Attending Provider: Storm Frisk, MD WUJ:WJXB,JYNWGN F, NP Code Status: FULL  [name] is a 52 y.o. female patient transferred from 2S  -No acute distress noted.   Pt wheezing   Cardiac Monitoring: Box [id number] in place. Cardiac monitor yields:normal sinus rhythm.  BP 160/104, Pulse 89, Resp 20, Sp02 95% on 4L nasal cannula, Temp 63F.    IV Fluids:  IV in place, occlusive dsg intact without redness, IV cath forearm left, condition patent and no redness and antecubital right, condition patent and no redness none.   Allergies:  Tape  Past Medical History:   has a past medical history of Right-sided heart failure; COPD (chronic obstructive pulmonary disease); Emphysema; Seizures; Thyroid disease; Depression; Emphysema; Arthritis; Blood transfusion without reported diagnosis; High cholesterol; Anxiety; Nerves; Panic attack; and Thyroid disorder.  Past Surgical History:   has no past surgical history on file.  Social History:   reports that she has been smoking.  She does not have any smokeless tobacco history on file.  Skin: Sacrum reddened scratch like marks covered with foam.   Patient/Family orientated to room. Information packet given to patient/family. Admission inpatient armband information verified with patient/family to include name and date of birth and placed on patient arm. Side rails up x 2, fall assessment and education completed with patient/family. Patient/family able to verbalize understanding of risk associated with falls and verbalized understanding to call for assistance before getting out of bed. Call light within reach. Patient/family able to voice and demonstrate understanding of unit orientation instructions.    Will continue to evaluate and treat per MD orders.

## 2013-08-22 NOTE — Progress Notes (Signed)
PHARMACIST - PHYSICIAN COMMUNICATION DR: Molli Knock CONCERNING:  Oseltamivir 30 mg capsule shortage  DESCRIPTION:  [place] is experiencing a significant shortage of Oseltamivir 30mg  capsules.    This patient has an order for Oseltamivir (Tamiflu) and has an Estimated Creatinine Clearance: 65 ml/min (by C-G formula based on Cr of 0.97)..   RECOMMENDATION: Oseltamivir 75mg  PO DAILY x 5 days has been substituted for your patient.  Renal function has improved If you have any questions about this temporary substitution please feel free to call the Pharmacy at [phone] for assistance.  Talbert Cage, PharmD

## 2013-08-22 NOTE — Progress Notes (Signed)
PULMONARY  / CRITICAL CARE MEDICINE  Name: [name] MRN: [id number] DOB: 1961-02-20    ADMISSION DATE:  08/20/2013 CONSULTATION DATE:  2/27  REFERRING MD :  ED PRIMARY SERVICE: Pulm/CCM  CHIEF COMPLAINT:  COPD exacerbation  BRIEF PATIENT DESCRIPTION: 52 yo with COPD, on home O2, smokes 2 PPD,recent hospitalization to [place] for pneumonia who presented with AMS.  Patient comatose at arrival, received narcan with abrupt awakening with noted severe agitation.  Patient with wheezing and heavy work of breathing.  Intubated for respiratory failure and agitation.  Following intubation for hypercapneic respiratory failure, large amount of purulent material suctioned out of ET tube.   Multiple hospitalizations in the last year, the last of which was 1 week ago (dx with copd and pna) and chronic medical problems including severe COPD (on home o2, continues to smoke heavily), chronic pain, CHF (HFpEF, likely diasostic vs RV dysfunction due to COPD with history of pulmonary edema and le edema), PE earlier this year in January 2014 on no home anticoagulation, and hypothyroidism.  Baseline CO2 is around 40 (probably higher as indicated by alkalosis on abg).  SIGNIFICANT EVENTS / STUDIES:  12/27 agitation, purulent secretions  LINES / TUBES: ET tube in place  CULTURES: Blood >> neg Sputum >> candida resp viral >> influenza A H3  POS  ANTIBIOTICS: Cefepime 2 GM bid (due to recent abx exposure, severe copd exacerbation, recent hospitalization) >> Doxycycline 100 mg bid (no azithromycin due to qt c ) >> Tamiflu due to recent exposure to URI from husband and significant COPD exacerbation >>     SUBJECTIVE: afebrile Denies pain Anxious, c/o panic attacks  VITAL SIGNS: Temp:  [98.1 F (36.7 C)-99.1 F (37.3 C)] 98.6 F (37 C) (12/29 0737) Pulse Rate:  [84-100] 92 (12/29 1000) Resp:  [13-34] 26 (12/29 0900) BP: (118-161)/(68-95) 160/89 mmHg (12/29 1000) SpO2:  [89 %-97 %] 96 %  (12/29 1000) Weight:  [70.444 kg (155 lb 4.8 oz)] 70.444 kg (155 lb 4.8 oz) (12/29 0500) HEMODYNAMICS:   VENTILATOR SETTINGS:   INTAKE / OUTPUT: Intake/Output     12/28 0701 - 12/29 0700 12/29 0701 - 12/30 0700   P.O. 240    I.V. (mL/kg) 65.9 (0.9)    NG/GT 140    IV Piggyback 50    Total Intake(mL/kg) 495.9 (7)    Urine (mL/kg/hr) 2545 (1.5) 300 (1.1)   Emesis/NG output     Other     Stool  1 (0)   Total Output 2545 301   Net -2049.1 -301          General: VSS obese female, non toxic, no diaphoresis Head: Eyes sluggish pupillary light reflex. Normal cephalic and atramatic.  No icterus.  Noted scleral edema.  Oral mucosa moist with frothy sputum Lungs: bilateral rhonchi decreased.  Unable to appreciate any rales.  Fair air movment Heart: HRRR S1 S2 Pulses are 2+ & equal, no MRG noted on exam. Abdomen: No distention.  Bowel sounds are positive, abdomen soft and non-tender without masses, no hepatosplenomegaly Extremities: No clubbing, cyanosis or edema. DP +2 Neuro: anxious, non focal  LABS:  Recent Labs Lab 08/20/13 1926  08/20/13 2022 08/20/13 2024  08/21/13 0015 08/21/13 0350 08/21/13 0655 08/21/13 1002 08/22/13 0415 08/22/13 0435  HGB 13.7  --   --   --   --   --  13.3  --   --  12.8  --   WBC 10.8*  --   --   --   --   --  9.4  --   --  7.7  --   PLT 192  --   --   --   --   --  158  --   --  175  --   NA 136  --   --   --   --   --  137  --   --  139  --   K 5.2*  --   --   --   --   --  4.0  --   --  3.4*  --   CL 98  --   --   --   --   --  99  --   --  102  --   CO2 29  --   --   --   --   --  27  --   --  29  --   GLUCOSE 145*  --   --   --   --   --  128*  --   --  109*  --   BUN 42*  --   --   --   --   --  38*  --   --  32*  --   CREATININE 1.58*  --   --   --   --   --  1.29*  --   --  0.97  --   CALCIUM 8.4  --   --   --   --   --  8.1*  --   --  8.0*  --   MG  --   --   --   --   --  2.9* 2.7*  --   --  2.5  --   PHOS  --   --   --   --   --   --   4.2  --   --  4.0  --   ALBUMIN  --   --   --   --   --   --  2.7*  --   --  2.5*  --   APTT  --   --   --   --   --  24  --   --   --   --   --   INR  --   --   --   --   --  0.84  --   --   --   --   --   LATICACIDVEN  --   --  1.28 0.9  --   --   --   --   --   --   --   PROBNP 736.3*  --   --   --   --   --   --   --   --   --   --   PHART  --   < >  --   --   < >  --   --  7.443 7.350  --  7.549*  PCO2ART  --   < >  --   --   < >  --   --  42.4 56.9*  --  35.7  PO2ART  --   < >  --   --   < >  --   --  82.0 73.0*  --  61.0*  < > = values in this interval not displayed.  Recent Labs Lab 08/21/13 1645 08/21/13 1943 08/22/13  0009 08/22/13 0348 08/22/13 0740  GLUCAP 115* 120* 129* 101* 101*    CXR: increased bibasal markings EKG pending  ASSESSMENT / PLAN:  PULMONARY A:  Hypercapnic respiratory Failure Multifactorial; COPD exacerbation, oversedation with opiates Patient with recent hospitalization, recent antibiotic exposure, acute decompensation with AMS and severe acidosis, profuse purulent sputum production, recent contact with husband who has URI (patient has received flu shot, however N3H1 strain not covered and currently in circulation) P:   Antibiotic coverage with cefepime (risk for pseudomonas with above risk factors) and doxy (avoiding azithromycin due to hx of prolonged qtc hx and cymbalta use).  Tamiflu x 5ds Methylprednisolone  60 mg Q12h -PO in24h  Albuteroltreatments Q6 Resume advair/spiriva  CARDIOVASCULAR A: CHF with HFpEF Noted scleral edema, clear cxr, and history of le edema. History of RV dysfunction P:  Optimize hypoxia May need diuresis with signs of overload, however will allow autodiuresis and follow for now as patient stable without significantly increased o2 requirments or RV failure resume CCB   RENAL A:  Hyperkalemia -resolved, Possibly due to significant acidosis Long standing CKD III reviewed medications without preciptating  factors   P:   Replete K  A:  CKDII  Possible AKI on top of CKD AKI Acute on chronic renal failure Multifactorial Pre-Renal/Shock/Septic Nephropathy/Acute interstitial nephritis  P: Foley for strict I/O's Avoid nephrotoxic substances Renally dose medications   GASTROINTESTINAL A:  GI prophylaxis  P:   Protonix po -home dose  A:  Diet P: Dc TFs since extubation planned, resume if not extubated  HEMATOLOGIC A:  DVT prophylaxis History of PE with non occlusive subsegmental pe in left lower lobes (january of this year). No home anticoagulation currently P:  Heparin tid  INFECTIOUS A:  COPD exacerbation No indication for pneumonia, no frank infiltrates P:   cefepime (risk for pseudomonas with above risk factors) and doxy (avoiding azithromycin due to hx of prolonged qtc hx and cymbalta use). Tamiflu  Simplify in 24h    ENDOCRINE A:  Hyperglycemia   Patient started on steroids with high risk of hyperglycemia P:   Goal Glucose in critically ill 140-180 SSI   NEUROLOGIC A:  AMS Multifactorial, likely due to hypercapnia and oversedation due to narcotic use.  Head CT neg  Panic attacks P: resume xanax, cymbalta Nicotine patch   OK to transfer tot [facility]  Pulmonary and Critical Care Medicine [facility] Pager: [phone]  08/22/2013, 10:53 AM

## 2013-08-22 NOTE — Progress Notes (Signed)
Report was called to [place] on 5W.  All VS WNL upon transfer.  Pt transferred via wheelchair.  Receiving nurse was notifying the husband of the pt's transfer when she arrived.

## 2013-08-23 DIAGNOSIS — F411 Generalized anxiety disorder: Secondary | ICD-10-CM

## 2013-08-23 LAB — RENAL FUNCTION PANEL
Albumin: 2.8 g/dL — ABNORMAL LOW (ref 3.5–5.2)
Calcium: 8.4 mg/dL (ref 8.4–10.5)
Chloride: 102 mEq/L (ref 96–112)
Creatinine, Ser: 0.87 mg/dL (ref 0.50–1.10)
GFR calc Af Amer: 89 mL/min — ABNORMAL LOW (ref >90)
GFR calc non Af Amer: 76 mL/min — ABNORMAL LOW (ref >90)
Phosphorus: 3.8 mg/dL (ref 2.3–4.6)
Potassium: 4.3 mEq/L (ref 3.7–5.3)

## 2013-08-23 LAB — GLUCOSE, CAPILLARY
Glucose-Capillary: 134 mg/dL — ABNORMAL HIGH (ref 70–99)
Glucose-Capillary: 95 mg/dL (ref 70–99)
Glucose-Capillary: 142 mg/dL — ABNORMAL HIGH (ref 70–99)
Glucose-Capillary: 111 mg/dL — ABNORMAL HIGH (ref 70–99)

## 2013-08-23 LAB — CBC
MCH: 31.6 pg (ref 26.0–34.0)
MCHC: 32.9 g/dL (ref 30.0–36.0)
Platelets: 170 10*3/uL (ref 150–400)
RDW: 16.4 % — ABNORMAL HIGH (ref 11.5–15.5)

## 2013-08-23 LAB — MAGNESIUM: Magnesium: 2.5 mg/dL (ref 1.5–2.5)

## 2013-08-23 MED ORDER — PREGABALIN 50 MG PO CAPS
100.0000 mg | ORAL_CAPSULE | Freq: Three times a day (TID) | ORAL | Status: DC
  Administered 2013-08-23 – 2013-08-26 (×10): 100 mg via ORAL
  Filled 2013-08-23 (×10): qty 2

## 2013-08-23 MED ORDER — TRAZODONE HCL 100 MG PO TABS
100.0000 mg | ORAL_TABLET | Freq: Every day | ORAL | Status: DC
  Administered 2013-08-23 – 2013-08-25 (×3): 100 mg via ORAL
  Filled 2013-08-23 (×4): qty 1

## 2013-08-23 MED ORDER — ALBUTEROL SULFATE (2.5 MG/3ML) 0.083% IN NEBU
2.5000 mg | INHALATION_SOLUTION | RESPIRATORY_TRACT | Status: DC
  Administered 2013-08-23 – 2013-08-26 (×16): 2.5 mg via RESPIRATORY_TRACT
  Filled 2013-08-23 (×35): qty 3

## 2013-08-23 NOTE — Progress Notes (Signed)
PULMONARY  / CRITICAL CARE MEDICINE  Name: [name] MRN: [id number] DOB: 03-Jan-1961    ADMISSION DATE:  08/20/2013 CONSULTATION DATE:  2/27  REFERRING MD :  ED PRIMARY SERVICE: Pulm/CCM  CHIEF COMPLAINT:  COPD exacerbation  BRIEF PATIENT DESCRIPTION: 52 yo with COPD, on home O2, smokes 2 PPD,recent hospitalization to [place] for pneumonia who presented with AMS.  Patient comatose at arrival, received narcan with abrupt awakening with noted severe agitation.  Patient with wheezing and heavy work of breathing.  Intubated for respiratory failure and agitation.  Following intubation for hypercapneic respiratory failure, large amount of purulent material suctioned out of ET tube.   Multiple hospitalizations in the last year, the last of which was 1 week ago (dx with copd and pna) and chronic medical problems including severe COPD (on home o2, continues to smoke heavily), chronic pain, CHF (HFpEF, likely diasostic vs RV dysfunction due to COPD with history of pulmonary edema and le edema), PE earlier this year in January 2014 on no home anticoagulation, and hypothyroidism.  Baseline CO2 is around 40 (probably higher as indicated by alkalosis on abg).  SIGNIFICANT EVENTS / STUDIES:  12/27 agitation, purulent secretions  LINES / TUBES: ETT 12/27 >> 12/29  CULTURES: Blood >> neg Sputum >> candida resp viral >> influenza A H3  POS  ANTIBIOTICS: Cefepime 2 GM bid (due to recent abx exposure, severe copd exacerbation, recent hospitalization) >> Doxycycline 100 mg bid (no azithromycin due to qt c ) >> Tamiflu due to recent exposure to URI from husband and significant COPD exacerbation >>     SUBJECTIVE:  afebrile C/o pain Anxious, c/o panic attacks  VITAL SIGNS: Temp:  [98 F (36.7 C)-98.8 F (37.1 C)] 98.8 F (37.1 C) (12/30 0442) Pulse Rate:  [78-89] 81 (12/30 0442) Resp:  [19-25] 20 (12/30 0442) BP: (151-161)/(96-106) 151/97 mmHg (12/30 0442) SpO2:  [94 %-95 %] 94 %  (12/30 1135) Weight:  [66.543 kg (146 lb 11.2 oz)-73.347 kg (161 lb 11.2 oz)] 66.543 kg (146 lb 11.2 oz) (12/30 0506) HEMODYNAMICS:   VENTILATOR SETTINGS:   INTAKE / OUTPUT: Intake/Output     12/29 0701 - 12/30 0700 12/30 0701 - 12/31 0700   P.O.     I.V. (mL/kg)     NG/GT     IV Piggyback 100    Total Intake(mL/kg) 100 (1.5)    Urine (mL/kg/hr) 1775 (1.1)    Stool 2 (0)    Total Output 1777     Net -1677            General: VSS obese female, non toxic, no diaphoresis Head: Eyes sluggish pupillary light reflex. Normal cephalic and atramatic.  No icterus.  Noted scleral edema.  Oral mucosa moist with frothy sputum Lungs: bilateral rhonchi decreased.  Unable to appreciate any rales.  Fair air movment Heart: HRRR S1 S2 Pulses are 2+ & equal, no MRG noted on exam. Abdomen: No distention.  Bowel sounds are positive, abdomen soft and non-tender without masses, no hepatosplenomegaly Extremities: No clubbing, cyanosis or edema. DP +2 Neuro: anxious, non focal  LABS:  Recent Labs Lab 08/20/13 1926  08/20/13 2022 08/20/13 2024  08/21/13 0015 08/21/13 0350 08/21/13 0655 08/21/13 1002 08/22/13 0415 08/22/13 0435 08/23/13 0740  HGB 13.7  --   --   --   --   --  13.3  --   --  12.8  --  14.4  WBC 10.8*  --   --   --   --   --  9.4  --   --  7.7  --  7.3  PLT 192  --   --   --   --   --  158  --   --  175  --  170  NA 136  --   --   --   --   --  137  --   --  139  --  143  K 5.2*  --   --   --   --   --  4.0  --   --  3.4*  --  4.3  CL 98  --   --   --   --   --  99  --   --  102  --  102  CO2 29  --   --   --   --   --  27  --   --  29  --  30  GLUCOSE 145*  --   --   --   --   --  128*  --   --  109*  --  85  BUN 42*  --   --   --   --   --  38*  --   --  32*  --  35*  CREATININE 1.58*  --   --   --   --   --  1.29*  --   --  0.97  --  0.87  CALCIUM 8.4  --   --   --   --   --  8.1*  --   --  8.0*  --  8.4  MG  --   --   --   --   < > 2.9* 2.7*  --   --  2.5  --  2.5  PHOS   --   --   --   --   --   --  4.2  --   --  4.0  --  3.8  ALBUMIN  --   --   --   --   --   --  2.7*  --   --  2.5*  --  2.8*  APTT  --   --   --   --   --  24  --   --   --   --   --   --   INR  --   --   --   --   --  0.84  --   --   --   --   --   --   LATICACIDVEN  --   --  1.28 0.9  --   --   --   --   --   --   --   --   PROBNP 736.3*  --   --   --   --   --   --   --   --   --   --   --   PHART  --   < >  --   --   < >  --   --  7.443 7.350  --  7.549*  --   PCO2ART  --   < >  --   --   < >  --   --  42.4 56.9*  --  35.7  --   PO2ART  --   < >  --   --   < >  --   --  82.0 73.0*  --  61.0*  --   < > = values in this interval not displayed.  Recent Labs Lab 08/22/13 2009 08/23/13 0043 08/23/13 0437 08/23/13 0753 08/23/13 1204  GLUCAP 112* 118* 134* 95 142*    CXR: increased bibasal markings EKG pending  ASSESSMENT / PLAN:  PULMONARY A:  Hypercapnic respiratory Failure Multifactorial; COPD exacerbation, oversedation with opiates Patient with recent hospitalization, recent antibiotic exposure, acute decompensation with AMS and severe acidosis, profuse purulent sputum production, recent contact with husband who has URI (patient has received flu shot, however N3H1 strain not covered and currently in circulation) P:   Ct cefepime (risk for pseudomonas with above risk factors) and doxy (avoiding azithromycin due to hx of prolonged qtc hx and cymbalta use).  Tamiflu x 5ds Methylprednisolone  60 mg Q12h -PO in24h  Albuterol treatments Q6 Resumed advair/spiriva  CARDIOVASCULAR A: CHF with HFpEF Noted scleral edema, clear cxr, and history of le edema. History of RV dysfunction P: Resumed cardizem 300   RENAL A:  Hyperkalemia -resolved, Possibly due to significant acidosis Long standing CKD III reviewed medications without preciptating factors  P:   Replete K  A:  CKD II  Possible AKI on top of CKD AKI Acute on chronic renal failure Multifactorial  Pre-Renal/Shock/Septic Nephropathy/Acute interstitial nephritis  P: Foley for strict I/O's Avoid nephrotoxic substances Renally dose medications   GASTROINTESTINAL A:  GI prophylaxis  P:   Protonix po -home dose Advance Diet  HEMATOLOGIC A:  DVT prophylaxis History of PE with non occlusive subsegmental pe in left lower lobes (january of this year). No home anticoagulation currently P:  Heparin tid  INFECTIOUS A:  COPD exacerbation No indication for pneumonia, no frank infiltrates P:   cefepime (risk for pseudomonas with above risk factors) and doxy (avoiding azithromycin due to hx of prolonged qtc hx and cymbalta use). Tamiflu  Simplify once cxr clears    ENDOCRINE A:  Hyperglycemia   Patient started on steroids with high risk of hyperglycemia P:   Goal Glucose in critically ill 140-180 SSI   NEUROLOGIC A:  AMS Multifactorial, likely due to hypercapnia and oversedation due to narcotic use.  Head CT neg  Panic attacks P: resumed xanax, cymbalta Nicotine patch Resumed lyrica, trazodone-lower dose   OK to transfer to tele  [facility]  Pulmonary and Critical Care Medicine [facility] Pager: [phone]  08/23/2013, 2:41 PM

## 2013-08-24 ENCOUNTER — Inpatient Hospital Stay (HOSPITAL_COMMUNITY): Payer: Medicare Other

## 2013-08-24 LAB — GLUCOSE, CAPILLARY
Glucose-Capillary: 107 mg/dL — ABNORMAL HIGH (ref 70–99)
Glucose-Capillary: 117 mg/dL — ABNORMAL HIGH (ref 70–99)
Glucose-Capillary: 121 mg/dL — ABNORMAL HIGH (ref 70–99)
Glucose-Capillary: 122 mg/dL — ABNORMAL HIGH (ref 70–99)

## 2013-08-24 MED ORDER — BIOTENE DRY MOUTH MT LIQD
15.0000 mL | Freq: Two times a day (BID) | OROMUCOSAL | Status: DC
  Administered 2013-08-25 – 2013-08-26 (×3): 15 mL via OROMUCOSAL

## 2013-08-24 MED ORDER — METHYLPREDNISOLONE SODIUM SUCC 40 MG IJ SOLR
40.0000 mg | Freq: Two times a day (BID) | INTRAMUSCULAR | Status: DC
  Administered 2013-08-24 – 2013-08-26 (×4): 40 mg via INTRAVENOUS
  Filled 2013-08-24 (×6): qty 1

## 2013-08-24 NOTE — Progress Notes (Signed)
Post breathing treatment, patients sats came up to 95% on 3.5L [place].  No oxygen adjustments were needed.

## 2013-08-24 NOTE — Progress Notes (Signed)
PULMONARY  / CRITICAL CARE MEDICINE  Name: [name] MRN: [id number] DOB: May 13, 1961    ADMISSION DATE:  08/20/2013 CONSULTATION DATE:  2/27  REFERRING MD :  ED PRIMARY SERVICE: Pulm/CCM  CHIEF COMPLAINT:  COPD exacerbation  BRIEF PATIENT DESCRIPTION: 52 yo with COPD, on home O2, smokes 2 PPD,recent hospitalization to [place] for pneumonia who presented with AMS.  Patient comatose at arrival, received narcan with abrupt awakening with noted severe agitation.  Patient with wheezing and heavy work of breathing.  Intubated for respiratory failure and agitation.  Following intubation for hypercapneic respiratory failure, large amount of purulent material suctioned out of ET tube.   Multiple hospitalizations in the last year, the last of which was 1 week ago (dx with copd and pna) and chronic medical problems including severe COPD (on home o2, continues to smoke heavily), chronic pain, CHF (HFpEF, likely diasostic vs RV dysfunction due to COPD with history of pulmonary edema and le edema), PE earlier this year in January 2014 on no home anticoagulation, and hypothyroidism.  Baseline CO2 is around 40 (probably higher as indicated by alkalosis on abg).  SIGNIFICANT EVENTS / STUDIES:  12/27 agitation, purulent secretions  LINES / TUBES: ETT 12/27 >> 12/29  CULTURES: Blood >> neg Sputum >> candida resp viral >> influenza A H3  POS  ANTIBIOTICS: Cefepime 2 GM bid (due to recent abx exposure, severe copd exacerbation, recent hospitalization) >> Doxycycline 100 mg bid (no azithromycin due to qt c ) >> Tamiflu due to recent exposure to URI from husband and significant COPD exacerbation >>12/31     SUBJECTIVE:  afebrile Anxiety is better No cough  VITAL SIGNS: Temp:  [98.3 F (36.8 C)-99.2 F (37.3 C)] 99.2 F (37.3 C) (12/31 0524) Pulse Rate:  [76-93] 89 (12/31 0556) Resp:  [20] 20 (12/31 0524) BP: (109-132)/(54-86) 122/78 mmHg (12/31 0944) SpO2:  [88 %-94 %] 88 % (12/31  0823) Weight:  [67.178 kg (148 lb 1.6 oz)] 67.178 kg (148 lb 1.6 oz) (12/31 0524) HEMODYNAMICS:   VENTILATOR SETTINGS:   INTAKE / OUTPUT: Intake/Output     12/30 0701 - 12/31 0700 12/31 0701 - 01/01 0700   P.O.  240   IV Piggyback 50    Total Intake(mL/kg) 50 (0.7) 240 (3.6)   Urine (mL/kg/hr) 1750 (1.1) 350 (0.9)   Stool     Total Output 1750 350   Net -1700 -110          General: VSS obese female, non toxic, no diaphoresis Head: Eyes sluggish pupillary light reflex. Normal cephalic and atramatic.  No icterus.  Noted scleral edema.  Oral mucosa moist with frothy sputum Lungs: bilateral rhonchi decreased.  Unable to appreciate any rales.  Fair air movment Heart: HRRR S1 S2 Pulses are 2+ & equal, no MRG noted on exam. Abdomen: No distention.  Bowel sounds are positive, abdomen soft and non-tender without masses, no hepatosplenomegaly Extremities: No clubbing, cyanosis or edema. DP +2 Neuro: anxious, non focal  LABS:  Recent Labs Lab 08/20/13 1926  08/20/13 2022 08/20/13 2024  08/21/13 0015 08/21/13 0350 08/21/13 0655 08/21/13 1002 08/22/13 0415 08/22/13 0435 08/23/13 0740  HGB 13.7  --   --   --   --   --  13.3  --   --  12.8  --  14.4  WBC 10.8*  --   --   --   --   --  9.4  --   --  7.7  --  7.3  PLT  192  --   --   --   --   --  158  --   --  175  --  170  NA 136  --   --   --   --   --  137  --   --  139  --  143  K 5.2*  --   --   --   --   --  4.0  --   --  3.4*  --  4.3  CL 98  --   --   --   --   --  99  --   --  102  --  102  CO2 29  --   --   --   --   --  27  --   --  29  --  30  GLUCOSE 145*  --   --   --   --   --  128*  --   --  109*  --  85  BUN 42*  --   --   --   --   --  38*  --   --  32*  --  35*  CREATININE 1.58*  --   --   --   --   --  1.29*  --   --  0.97  --  0.87  CALCIUM 8.4  --   --   --   --   --  8.1*  --   --  8.0*  --  8.4  MG  --   --   --   --   < > 2.9* 2.7*  --   --  2.5  --  2.5  PHOS  --   --   --   --   --   --  4.2  --   --   4.0  --  3.8  ALBUMIN  --   --   --   --   --   --  2.7*  --   --  2.5*  --  2.8*  APTT  --   --   --   --   --  24  --   --   --   --   --   --   INR  --   --   --   --   --  0.84  --   --   --   --   --   --   LATICACIDVEN  --   --  1.28 0.9  --   --   --   --   --   --   --   --   PROBNP 736.3*  --   --   --   --   --   --   --   --   --   --   --   PHART  --   < >  --   --   < >  --   --  7.443 7.350  --  7.549*  --   PCO2ART  --   < >  --   --   < >  --   --  42.4 56.9*  --  35.7  --   PO2ART  --   < >  --   --   < >  --   --  82.0 73.0*  --  61.0*  --   < > =  values in this interval not displayed.  Recent Labs Lab 08/23/13 1938 08/24/13 0042 08/24/13 0358 08/24/13 0755 08/24/13 1229  GLUCAP 111* 134* 122* 107* 117*    CXR: improving bibasal pna  ASSESSMENT / PLAN:  PULMONARY A:  Hypercapnic respiratory Failure Multifactorial; COPD exacerbation, oversedation with opiates Patient with recent hospitalization, recent antibiotic exposure, acute decompensation with AMS and severe acidosis, profuse purulent sputum production, recent contact with husband who has URI (patient has received flu shot, however N3H1 strain not covered and currently in circulation) P:   Ct cefepime (risk for pseudomonas with above risk factors) and doxy (avoiding azithromycin due to hx of prolonged qtc hx and cymbalta use).  Tamiflu x 5ds Po prednisone in 24h - change medrol to 40 q 12 Albuterol treatments Q6 Resumed advair/spiriva  CARDIOVASCULAR A: CHF with HFpEF Noted scleral edema, clear cxr, and history of le edema. History of RV dysfunction P: Resumed cardizem 300   RENAL A:  Hyperkalemia -resolved, Possibly due to significant acidosis Long standing CKD III reviewed medications without preciptating factors  P:   Replete K  A:  AKI -resolved  P: Foley dc   GASTROINTESTINAL A:  GI prophylaxis  P:   Protonix po -home dose Advance Diet  HEMATOLOGIC A:  DVT prophylaxis History  of PE with non occlusive subsegmental pe in left lower lobes (january of this year). No home anticoagulation currently P:  Heparin tid  INFECTIOUS A:  COPD exacerbation No indication for pneumonia, no frank infiltrates P:   Dc Tamiflu  Ct cefepime -Can change to augmentin on dc     ENDOCRINE A:  Hyperglycemia   Patient started on steroids with high risk of hyperglycemia P:   SSI   NEUROLOGIC A:  AMS Multifactorial, likely due to hypercapnia and oversedation due to narcotic use.  Head CT neg  Panic attacks P: resumed xanax, cymbalta Nicotine patch Resumed lyrica, trazodone-lower dose   OK to dc tele She is eager to go home but i advised her to stay until bspasm resolves given high risk readmission Smoking cessation again emphasized  Oretha Milch  Pulmonary and Critical Care Medicine [facility] Pager: [phone]  08/24/2013, 12:49 PM

## 2013-08-25 DIAGNOSIS — J449 Chronic obstructive pulmonary disease, unspecified: Secondary | ICD-10-CM

## 2013-08-25 LAB — GLUCOSE, CAPILLARY
GLUCOSE-CAPILLARY: 124 mg/dL — AB (ref 70–99)
GLUCOSE-CAPILLARY: 155 mg/dL — AB (ref 70–99)
Glucose-Capillary: 111 mg/dL — ABNORMAL HIGH (ref 70–99)
Glucose-Capillary: 116 mg/dL — ABNORMAL HIGH (ref 70–99)
Glucose-Capillary: 126 mg/dL — ABNORMAL HIGH (ref 70–99)
Glucose-Capillary: 145 mg/dL — ABNORMAL HIGH (ref 70–99)

## 2013-08-25 NOTE — Progress Notes (Signed)
PULMONARY  / CRITICAL CARE MEDICINE  Name: [name] MRN: [id number] DOB: [birth year]    ADMISSION DATE:  08/20/2013 CONSULTATION DATE:  2/27  REFERRING MD :  ED PRIMARY SERVICE: Pulm/CCM  CHIEF COMPLAINT:  COPD exacerbation  BRIEF PATIENT DESCRIPTION: 53 yo with COPD, on home O2, smokes 2 PPD, recent hospitalization to [facility] for pneumonia who presented with AMS.  Patient comatose at arrival, received narcan with abrupt awakening with noted severe agitation.  Patient with wheezing and heavy work of breathing.  Intubated for respiratory failure and agitation.  Following intubation for hypercapneic respiratory failure, large amount of purulent material suctioned out of ET tube.   Multiple hospitalizations in the last year, the last of which was 1 week ago (dx with copd and pna) and chronic medical problems including severe COPD (on home o2, continues to smoke heavily), chronic pain, CHF (HFpEF, likely diasostic vs RV dysfunction due to COPD with history of pulmonary edema and LE edema), PE earlier this year in January 2014 on no home anticoagulation, and hypothyroidism.  Baseline CO2 is around 40 (probably higher as indicated by alkalosis on abg).  SIGNIFICANT EVENTS / STUDIES:  12/27 agitation, purulent secretions  LINES / TUBES: ETT 12/27 >> 12/29  CULTURES: Blood >> neg Sputum >> candida resp viral >> influenza A H3  POS  ANTIBIOTICS: Cefepime 2 GM bid (due to recent abx exposure, severe copd exacerbation, recent hospitalization) >> Tamiflu due to recent exposure to URI from husband and significant COPD exacerbation >>12/31  Sched MEDS:  . albuterol  2.5 mg Nebulization Q4H  . antiseptic oral rinse  15 mL Mouth Rinse BID  . ceFEPime (MAXIPIME) IV  2 g Intravenous Q12H  . diltiazem  300 mg Oral Daily  . docusate sodium  100 mg Oral BID  . DULoxetine  60 mg Oral BID  . feeding supplement (RESOURCE BREEZE)  1 Container Oral TID BM  . heparin  5,000 Units Subcutaneous Q8H   . insulin aspart  2-6 Units Subcutaneous Q4H  . levothyroxine  150 mcg Oral QAC breakfast  . methylPREDNISolone (SOLU-MEDROL) injection  40 mg Intravenous Q12H  . mometasone-formoterol  2 puff Inhalation BID  . nicotine  21 mg Transdermal Daily  . oseltamivir  75 mg Oral BID  . pantoprazole  40 mg Oral BID  . pregabalin  100 mg Oral TID  . tiotropium  18 mcg Inhalation Daily  . traZODone  100 mg Oral QHS     SUBJECTIVE:  afebrile Anxiety is better No cough  VITAL SIGNS: Temp:  [98.3 F (36.8 C)-98.7 F (37.1 C)] 98.7 F (37.1 C) (01/01 0523) Pulse Rate:  [77-84] 77 (01/01 0523) Resp:  [18] 18 (01/01 0523) BP: (115-134)/(63-79) 134/79 mmHg (01/01 0523) SpO2:  [89 %-95 %] 92 % (01/01 0523) Weight:  [68 kg (149 lb 14.6 oz)] 68 kg (149 lb 14.6 oz) (01/01 0523) HEMODYNAMICS:   VENTILATOR SETTINGS:   INTAKE / OUTPUT: Intake/Output     12/31 0701 - 01/01 0700 01/01 0701 - 01/02 0700   P.O. 720    IV Piggyback 100    Total Intake(mL/kg) 820 (12.1)    Urine (mL/kg/hr) 650 (0.4)    Total Output 650     Net +170           General: VSS obese female, non toxic, no diaphoresis Head: Eyes sluggish pupillary light reflex. Normal cephalic and atramatic.  No icterus.  Noted scleral edema.  Oral mucosa moist with frothy sputum Lungs: bilateral  rhonchi decreased.  Unable to appreciate any rales.  Fair air movment Heart: HRRR S1 S2 Pulses are 2+ & equal, no MRG noted on exam. Abdomen: No distention.  Bowel sounds are positive, abdomen soft and non-tender without masses, no hepatosplenomegaly Extremities: No clubbing, cyanosis or edema. DP +2 Neuro: anxious, non focal  LABS:  Recent Labs Lab 08/20/13 1926  08/20/13 2022 08/20/13 2024  08/21/13 0015 08/21/13 0350 08/21/13 0655 08/21/13 1002 08/22/13 0415 08/22/13 0435 08/23/13 0740  HGB 13.7  --   --   --   --   --  13.3  --   --  12.8  --  14.4  WBC 10.8*  --   --   --   --   --  9.4  --   --  7.7  --  7.3  PLT 192  --    --   --   --   --  158  --   --  175  --  170  NA 136  --   --   --   --   --  137  --   --  139  --  143  K 5.2*  --   --   --   --   --  4.0  --   --  3.4*  --  4.3  CL 98  --   --   --   --   --  99  --   --  102  --  102  CO2 29  --   --   --   --   --  27  --   --  29  --  30  GLUCOSE 145*  --   --   --   --   --  128*  --   --  109*  --  85  BUN 42*  --   --   --   --   --  38*  --   --  32*  --  35*  CREATININE 1.58*  --   --   --   --   --  1.29*  --   --  0.97  --  0.87  CALCIUM 8.4  --   --   --   --   --  8.1*  --   --  8.0*  --  8.4  MG  --   --   --   --   < > 2.9* 2.7*  --   --  2.5  --  2.5  PHOS  --   --   --   --   --   --  4.2  --   --  4.0  --  3.8  ALBUMIN  --   --   --   --   --   --  2.7*  --   --  2.5*  --  2.8*  APTT  --   --   --   --   --  24  --   --   --   --   --   --   INR  --   --   --   --   --  0.84  --   --   --   --   --   --   LATICACIDVEN  --   --  1.28 0.9  --   --   --   --   --   --   --   --  PROBNP 736.3*  --   --   --   --   --   --   --   --   --   --   --   PHART  --   < >  --   --   < >  --   --  7.443 7.350  --  7.549*  --   PCO2ART  --   < >  --   --   < >  --   --  42.4 56.9*  --  35.7  --   PO2ART  --   < >  --   --   < >  --   --  82.0 73.0*  --  61.0*  --   < > = values in this interval not displayed.  Recent Labs Lab 08/24/13 1547 08/24/13 1952 08/24/13 2341 08/25/13 0401 08/25/13 0746  GLUCAP 125* 167* 121* 116* 111*    CXR 12/31>  IMPRESSION:  Improving aeration at the lung bases.   ASSESSMENT / PLAN:  PULMONARY A:  Hypercapnic respiratory Failure Multifactorial; COPD exacerbation, oversedation with opiates Patient with recent hospitalization, recent antibiotic exposure, acute decompensation with AMS and severe acidosis, profuse purulent sputum production, recent contact with husband who has URI (patient has received flu shot, however N3H1 strain not covered and currently in circulation) P:   -on Maxipeme, Solumedrol,  Dulera, Spiriva, NEBS w/ Albut, and Tamiflu (12/29=>1/3)   CARDIOVASCULAR A: CHF with HFpEF Noted scleral edema, clear cxr, and history of le edema. History of RV dysfunction P: Resumed cardizem 300    RENAL A:  Hyperkalemia -resolved, Possibly due to significant acidosis Long standing CKD III reviewed medications without preciptating factors P:   Replete K AKI -resolved Foley dc   GASTROINTESTINAL A:  GI prophylaxis  P:   Protonix po -home dose Advance Diet  HEMATOLOGIC A:  DVT prophylaxis History of PE with non occlusive subsegmental pe in left lower lobes (january of this year). No home anticoagulation currently P:  Heparin tid   INFECTIOUS A:  COPD exacerbation No indication for pneumonia, no frank infiltrates P:   Dc Tamiflu  Ct cefepime -Can change to augmentin on dc    ENDOCRINE A:  Hyperglycemia   Patient started on steroids with high risk of hyperglycemia P:   SSI    NEUROLOGIC A:  AMS Multifactorial, likely due to hypercapnia and oversedation due to narcotic use.  Head CT neg  Panic attacks, agitation P: resumed xanax, cymbalta Nicotine patch Resumed lyrica, trazodone-lower dose   She insists that DrAlva told her she could go home tomorrow 08/26/13...   NADEL,SCOTT M [facility] 08/25/2013, 8:30 AM

## 2013-08-26 DIAGNOSIS — J111 Influenza due to unidentified influenza virus with other respiratory manifestations: Secondary | ICD-10-CM

## 2013-08-26 LAB — GLUCOSE, CAPILLARY
GLUCOSE-CAPILLARY: 95 mg/dL (ref 70–99)
GLUCOSE-CAPILLARY: 176 mg/dL — AB (ref 70–99)
Glucose-Capillary: 121 mg/dL — ABNORMAL HIGH (ref 70–99)

## 2013-08-26 MED ORDER — PREDNISONE 10 MG PO TABS: ORAL_TABLET | ORAL | Status: DC

## 2013-08-26 MED ORDER — DOXYCYCLINE HYCLATE 100 MG PO TABS: 100.0000 mg | ORAL_TABLET | Freq: Two times a day (BID) | ORAL | Status: DC

## 2013-08-26 MED ORDER — AMOXICILLIN-POT CLAVULANATE 875-125 MG PO TABS: 1.0000 | ORAL_TABLET | Freq: Two times a day (BID) | ORAL | Status: DC

## 2013-08-26 MED ORDER — OXYCODONE HCL 10 MG PO TABS
5.0000 mg | ORAL_TABLET | Freq: Four times a day (QID) | ORAL | Status: DC | PRN
Start: 2013-08-26 — End: 2013-11-11

## 2013-08-26 MED ORDER — ALBUTEROL SULFATE (2.5 MG/3ML) 0.083% IN NEBU
2.5000 mg | INHALATION_SOLUTION | Freq: Four times a day (QID) | RESPIRATORY_TRACT | Status: DC
  Administered 2013-08-26 (×2): 2.5 mg via RESPIRATORY_TRACT
  Filled 2013-08-26 (×2): qty 3

## 2013-08-26 MED ORDER — PREDNISONE 50 MG PO TABS
50.0000 mg | ORAL_TABLET | Freq: Every day | ORAL | Status: DC
Start: 2013-08-27 — End: 2013-08-26
  Filled 2013-08-26: qty 1

## 2013-08-26 MED ORDER — NICOTINE 21 MG/24HR TD PT24: 21.0000 mg | MEDICATED_PATCH | Freq: Every day | TRANSDERMAL | Status: DC

## 2013-08-26 MED ORDER — AMOXICILLIN-POT CLAVULANATE 875-125 MG PO TABS
1.0000 | ORAL_TABLET | Freq: Two times a day (BID) | ORAL | Status: DC
  Administered 2013-08-26: 1 via ORAL
  Filled 2013-08-26 (×2): qty 1

## 2013-08-26 NOTE — Progress Notes (Addendum)
Ready for dc today Augmentin 875 bid + doxy x 4 more days Completed Tamiflu pred 50 mg taper by 10 mg every 3 ds She will FU with dr Alcide Clever in [place] Smoking cessation strongly emphasized  ALVA,RAKESH V.

## 2013-08-26 NOTE — Discharge Summary (Signed)
Physician Discharge Summary  Patient ID: [name] MRN: [id number] DOB/AGE: Mar 02, 1961 53 y.o.  Admit date: 08/20/2013 Discharge date: 08/26/2013    Discharge Diagnoses:  Hypercapnic Respiratory Failure Hx of PE  Acute Exacerbation of COPD Tobacco Abuse CHF Hyperkalemia CKD Hyperglycemia Acute Encephalopathy                                                                      DISCHARGE PLAN BY DIAGNOSIS     Hypercapnic Respiratory Failure Hx of PE  Acute Exacerbation of COPD Tobacco Abuse  Discharge Plan: -Augmentin 875 bid + doxycycline x 4 more days  -Completed Tamiflu  -Pred 50 mg taper by 10 mg every 3 ds  -She [place] F[place]  -Smoking cessation strongly emphasized -Nicotine Patch -Increase home oxygen to 4L until seen by Dr. Alcide Clever  -resume advair, spiriva  CHF  Discharge Plan: -continue diltiazem  Hyperkalemia CKD  Discharge Plan: -f/u BMP PRN -continue iron   Hyperglycemia  Discharge Plan: Resolved.   Acute Encephalopathy Chronic Pain  Anxiety Depression  Discharge Plan: -patient asked to reduce oxycodone to $RemoveBefo'5mg'sJtDbNvsadW$  until seen by Dr. Alcide Clever (at risk hypercarbia) -continue trazodone, cymbalta, lyrica                  DISCHARGE SUMMARY   ERIK NESSEL is a 53 y.o. y/o female with a PMH of COPD, on home O2 (2L), smokes 2 PPD, recent hospitalization to [facility] for pneumonia who presented with AMS. Patient comatose on arrival, received narcan with abrupt awakening with noted severe agitation. Patient with wheezing and heavy work of breathing. Intubated for respiratory failure and agitation.  Following intubation for hypercapneic respiratory failure, large amount of purulent material suctioned out of ET tube.  Multiple hospitalizations in the last year, the last of which was 1 week prior to admit (dx with copd and pna) and chronic medical problems including severe COPD (on home o2, continues to smoke heavily), chronic  pain, CHF (HFpEF, likely diasostic vs RV dysfunction due to COPD with history of pulmonary edema and LE edema), PE earlier this year in January 2014 on no home anticoagulation, and hypothyroidism. Baseline CO2 is around 40 (probably higher as indicated by alkalosis on abg).  She remained on mechanical ventilation until 12/29 at which time she was extubated.  Viral cultures were positive for influenza A H3.  Sputum cultures demonstrated candida.  She was treated with Tamiflu and antibiotics as below.  Patient made slow clinical improvement and remains on 4L oxygen at time of discharge.  See discharge plan as above.     SIGNIFICANT EVENTS / STUDIES:  12/27 - agitation, purulent secretions  12/29 - extubated   LINES / TUBES:  ETT 12/27 >> 12/29   CULTURES:  Blood >> neg  Sputum >> candida  resp viral >> influenza A H3 POS   ANTIBIOTICS:  Cefepime 2 GM bid (due to recent abx exposure, severe copd exacerbation, recent hospitalization) >>1/2 Tamiflu due to recent exposure to URI from husband and significant COPD exacerbation 12/31>>>x 5 days Augmentin 1/2>>>x4 more days Doxycycline 1/2>>>x4 more days  Discharge Exam: General: chronically ill appearing female Neuro: AAOx4, speech clear, MAE CV: s1s2 rrr, no m/r/g PULM: resp's with prolonged resp  phase, lungs bilaterally coarse, 4L / [place] GI: round/soft, bsx4 active Extremities: warm/dry, no edema  Filed Vitals:   08/26/13 0613 08/26/13 0741 08/26/13 1402 08/26/13 1427  BP: 139/86   143/75  Pulse: 77   92  Temp: 98.5 F (36.9 C)   99 F (37.2 C)  TempSrc: Oral   Oral  Resp: 18   18  Height:      Weight:      SpO2: 94% 95% 92% 94%     Discharge Labs  BMET  Recent Labs Lab 08/20/13 1926 08/21/13 0015 08/21/13 0350 08/22/13 0415 08/23/13 0740  NA 136  --  137 139 143  K 5.2*  --  4.0 3.4* 4.3  CL 98  --  99 102 102  CO2 29  --  $R'27 29 30  'Ac$ GLUCOSE 145*  --  128* 109* 85  BUN 42*  --  38* 32* 35*  CREATININE 1.58*  --   1.29* 0.97 0.87  CALCIUM 8.4  --  8.1* 8.0* 8.4  MG  --  2.9* 2.7* 2.5 2.5  PHOS  --   --  4.2 4.0 3.8    CBC  Recent Labs Lab 08/21/13 0350 08/22/13 0415 08/23/13 0740  HGB 13.3 12.8 14.4  HCT 41.5 39.0 43.8  WBC 9.4 7.7 7.3  PLT 158 175 170    Anti-Coagulation  Recent Labs Lab 08/21/13 0015  INR 0.84        Discharge Orders   Future Appointments Provider Department Dept Phone   09/09/2013 1:45 PM Candee Furbish, MD [facility] [phone]   Future Orders Complete By Expires   Call MD for:  difficulty breathing, headache or visual disturbances  As directed    Call MD for:  persistant dizziness or light-headedness  As directed    Call MD for:  persistant nausea and vomiting  As directed    Call MD for:  temperature >100.4  As directed    Diet - low sodium heart healthy  As directed    Discharge instructions  As directed    Comments:     1. STOP SMOKING 2. Complete anti-biotics as prescribed 3. Keep your oxygen at 4L until you see Dr. Alcide Clever in follow up   Increase activity slowly  As directed      Follow-up Information   Schedule an appointment as soon as possible for a visit with Gardiner Rhyme, MD. (Call [phone] for appointment.  Need to be seen in 1-2 weeks. )    Specialty:  Specialist      Follow up with [name], NP. (As needed)    Contact information:   [address] [place] [address] [phone]        Medication List         ALPRAZolam 0.5 MG tablet  Commonly known as:  XANAX  Take 0.5 mg by mouth 2 (two) times daily as needed for anxiety.     amoxicillin-clavulanate 875-125 MG per tablet  Commonly known as:  AUGMENTIN  Take 1 tablet by mouth 2 (two) times daily. Started 08/13/13, for 14 days     CALCIUM 600 + D PO  Take 1 tablet by mouth 2 (two) times daily.     diltiazem 300 MG 24 hr capsule  Commonly known as:  TIAZAC  Take 300 mg by mouth daily.     doxycycline 100 MG tablet  Commonly known as:   VIBRA-TABS  Take 1 tablet (100 mg total) by  mouth 2 (two) times daily.     DULoxetine 60 MG capsule  Commonly known as:  CYMBALTA  Take 60 mg by mouth 2 (two) times daily.     ferrous sulfate 325 (65 FE) MG EC tablet  Take 325 mg by mouth daily.     Fluticasone-Salmeterol 250-50 MCG/DOSE Aepb  Commonly known as:  ADVAIR  Inhale 1 puff into the lungs every 12 (twelve) hours.     levothyroxine 150 MCG tablet  Commonly known as:  SYNTHROID, LEVOTHROID  Take 150 mcg by mouth daily.     LINZESS 145 MCG Caps capsule  Generic drug:  Linaclotide  Take 145 mcg by mouth daily.     loratadine 10 MG tablet  Commonly known as:  CLARITIN  Take 10 mg by mouth at bedtime.     nicotine 21 mg/24hr patch  Commonly known as:  NICODERM CQ - dosed in mg/24 hours  Place 1 patch (21 mg total) onto the skin daily.     Oxycodone HCl 10 MG Tabs  Take 0.5 tablets (5 mg total) by mouth every 6 (six) hours as needed (for pain).     pantoprazole 40 MG tablet  Commonly known as:  PROTONIX  Take 1 tablet (40 mg total) by mouth 2 (two) times daily.     predniSONE 10 MG tablet  Commonly known as:  DELTASONE  5 tabs for 3 days, then 4 tabs for 3 days, then 3 tabs for 3 days, then 2 tabs for 3 days, then 1 tab for 3 days.  Start taking on:  08/27/2013     pregabalin 100 MG capsule  Commonly known as:  LYRICA  Take 100 mg by mouth 3 (three) times daily.     tiotropium 18 MCG inhalation capsule  Commonly known as:  SPIRIVA  Place 18 mcg into inhaler and inhale daily.     traZODone 150 MG tablet  Commonly known as:  DESYREL  Take 150 mg by mouth at bedtime.     vitamin B-12 1000 MCG tablet  Commonly known as:  CYANOCOBALAMIN  Take 5,000 mcg by mouth daily.        Disposition: Home.  No home care needs identified at discharge.   Discharged Condition: [name] has met maximum benefit of inpatient care and is medically stable and cleared for discharge.  Patient is pending follow up as above.       Time spent on disposition:  Greater than 35 minutes.   Signed: Noe Gens, NP-C [facility] Pgr: [phone] Office: [phone]  Independently examined pt, evaluated data & formulated above discharge care plan with NP who scribed this note & edited by me.  Darlean Warmoth V.

## 2013-08-26 NOTE — Progress Notes (Signed)
Day shift RN told me in report that pt refused to let her change IV, pt stating that she was going home tomorrow. I explained to pt hospital policy, pt stated that "the doctor told me I could go home tomorrow after my antibiotics in the morning." Pt agreed to having the IV site changed tomorrow if she does not go home.

## 2013-08-27 LAB — CULTURE, BLOOD (ROUTINE X 2)
Culture: NO GROWTH
Culture: NO GROWTH

## 2013-08-29 NOTE — ED Provider Notes (Signed)
I have personally seen and examined the patient and discussed plan of care with the resident.  I was present for entire procedure.  I have reviewed the appropriate documentation on PMH/FH/Soc. History.  I have reviewed the documentation of the resident and agree.   I have reviewed and agree with the ECG interpretation(s) documented by the resident.  CRITICAL CARE Performed by: Sharyon Cable Total critical care time: 40 Critical care time was exclusive of separately billable procedures and treating other patients. Critical care was necessary to treat or prevent imminent or life-threatening deterioration. Critical care was time spent personally by me on the following activities: development of treatment plan with patient and/or surrogate as well as nursing, discussions with consultants, evaluation of patient's response to treatment, examination of patient, obtaining history from patient or surrogate, ordering and performing treatments and interventions, ordering and review of laboratory studies, ordering and review of radiographic studies, pulse oximetry and re-evaluation of patient's condition.  Pt ill appearing,  Needed airway management, stabilized in the ER and admitted to ICU  Sharyon Cable, MD 08/29/13 [phone]

## 2013-09-02 ENCOUNTER — Emergency Department (HOSPITAL_COMMUNITY): Payer: Medicare Other

## 2013-09-02 ENCOUNTER — Inpatient Hospital Stay (HOSPITAL_COMMUNITY)
Admission: EM | Admit: 2013-09-02 | Discharge: 2013-09-10 | DRG: 208 | Disposition: A | Discharge status: Home Health | Payer: Medicare Other | Attending: Internal Medicine | Admitting: Internal Medicine

## 2013-09-02 ENCOUNTER — Encounter (HOSPITAL_COMMUNITY): Payer: Self-pay | Admitting: Emergency Medicine

## 2013-09-02 DIAGNOSIS — F3289 Other specified depressive episodes: Secondary | ICD-10-CM | POA: Diagnosis present

## 2013-09-02 DIAGNOSIS — I498 Other specified cardiac arrhythmias: Secondary | ICD-10-CM | POA: Diagnosis present

## 2013-09-02 DIAGNOSIS — Z79899 Other long term (current) drug therapy: Secondary | ICD-10-CM

## 2013-09-02 DIAGNOSIS — E872 Acidosis, unspecified: Secondary | ICD-10-CM | POA: Diagnosis present

## 2013-09-02 DIAGNOSIS — J441 Chronic obstructive pulmonary disease with (acute) exacerbation: Secondary | ICD-10-CM | POA: Diagnosis present

## 2013-09-02 DIAGNOSIS — E876 Hypokalemia: Secondary | ICD-10-CM

## 2013-09-02 DIAGNOSIS — F17200 Nicotine dependence, unspecified, uncomplicated: Secondary | ICD-10-CM

## 2013-09-02 DIAGNOSIS — J4489 Other specified chronic obstructive pulmonary disease: Secondary | ICD-10-CM

## 2013-09-02 DIAGNOSIS — G4730 Sleep apnea, unspecified: Secondary | ICD-10-CM

## 2013-09-02 DIAGNOSIS — R0689 Other abnormalities of breathing: Secondary | ICD-10-CM

## 2013-09-02 DIAGNOSIS — L439 Lichen planus, unspecified: Secondary | ICD-10-CM | POA: Diagnosis present

## 2013-09-02 DIAGNOSIS — E870 Hyperosmolality and hypernatremia: Secondary | ICD-10-CM

## 2013-09-02 DIAGNOSIS — J9600 Acute respiratory failure, unspecified whether with hypoxia or hypercapnia: Secondary | ICD-10-CM

## 2013-09-02 DIAGNOSIS — J189 Pneumonia, unspecified organism: Secondary | ICD-10-CM

## 2013-09-02 DIAGNOSIS — D649 Anemia, unspecified: Secondary | ICD-10-CM | POA: Diagnosis present

## 2013-09-02 DIAGNOSIS — J9620 Acute and chronic respiratory failure, unspecified whether with hypoxia or hypercapnia: Secondary | ICD-10-CM

## 2013-09-02 DIAGNOSIS — R627 Adult failure to thrive: Secondary | ICD-10-CM | POA: Diagnosis present

## 2013-09-02 DIAGNOSIS — J1100 Influenza due to unidentified influenza virus with unspecified type of pneumonia: Principal | ICD-10-CM | POA: Diagnosis present

## 2013-09-02 DIAGNOSIS — IMO0001 Reserved for inherently not codable concepts without codable children: Secondary | ICD-10-CM

## 2013-09-02 DIAGNOSIS — G9340 Encephalopathy, unspecified: Secondary | ICD-10-CM | POA: Diagnosis present

## 2013-09-02 DIAGNOSIS — Z9079 Acquired absence of other genital organ(s): Secondary | ICD-10-CM

## 2013-09-02 DIAGNOSIS — R410 Disorientation, unspecified: Secondary | ICD-10-CM | POA: Diagnosis present

## 2013-09-02 DIAGNOSIS — M545 Low back pain, unspecified: Secondary | ICD-10-CM

## 2013-09-02 DIAGNOSIS — E039 Hypothyroidism, unspecified: Secondary | ICD-10-CM | POA: Diagnosis present

## 2013-09-02 DIAGNOSIS — I5022 Chronic systolic (congestive) heart failure: Secondary | ICD-10-CM | POA: Diagnosis present

## 2013-09-02 DIAGNOSIS — I509 Heart failure, unspecified: Secondary | ICD-10-CM | POA: Diagnosis present

## 2013-09-02 DIAGNOSIS — K219 Gastro-esophageal reflux disease without esophagitis: Secondary | ICD-10-CM

## 2013-09-02 DIAGNOSIS — F22 Delusional disorders: Secondary | ICD-10-CM | POA: Diagnosis present

## 2013-09-02 DIAGNOSIS — Z8541 Personal history of malignant neoplasm of cervix uteri: Secondary | ICD-10-CM

## 2013-09-02 DIAGNOSIS — R5381 Other malaise: Secondary | ICD-10-CM

## 2013-09-02 DIAGNOSIS — G8929 Other chronic pain: Secondary | ICD-10-CM | POA: Diagnosis present

## 2013-09-02 DIAGNOSIS — I5032 Chronic diastolic (congestive) heart failure: Secondary | ICD-10-CM | POA: Diagnosis present

## 2013-09-02 DIAGNOSIS — J984 Other disorders of lung: Secondary | ICD-10-CM

## 2013-09-02 DIAGNOSIS — N179 Acute kidney failure, unspecified: Secondary | ICD-10-CM | POA: Diagnosis present

## 2013-09-02 DIAGNOSIS — E781 Pure hyperglyceridemia: Secondary | ICD-10-CM

## 2013-09-02 DIAGNOSIS — Z8659 Personal history of other mental and behavioral disorders: Secondary | ICD-10-CM

## 2013-09-02 DIAGNOSIS — Z87891 Personal history of nicotine dependence: Secondary | ICD-10-CM

## 2013-09-02 DIAGNOSIS — B029 Zoster without complications: Secondary | ICD-10-CM

## 2013-09-02 DIAGNOSIS — J9692 Respiratory failure, unspecified with hypercapnia: Secondary | ICD-10-CM

## 2013-09-02 DIAGNOSIS — F329 Major depressive disorder, single episode, unspecified: Secondary | ICD-10-CM | POA: Diagnosis present

## 2013-09-02 DIAGNOSIS — F411 Generalized anxiety disorder: Secondary | ICD-10-CM

## 2013-09-02 DIAGNOSIS — J449 Chronic obstructive pulmonary disease, unspecified: Secondary | ICD-10-CM

## 2013-09-02 DIAGNOSIS — I5042 Chronic combined systolic (congestive) and diastolic (congestive) heart failure: Secondary | ICD-10-CM | POA: Diagnosis present

## 2013-09-02 HISTORY — DX: Pneumonia, unspecified organism: J18.9

## 2013-09-02 HISTORY — DX: Hypothyroidism, unspecified: E03.9

## 2013-09-02 LAB — MAGNESIUM: Magnesium: 1.7 mg/dL (ref 1.5–2.5)

## 2013-09-02 LAB — URINALYSIS, ROUTINE W REFLEX MICROSCOPIC
BILIRUBIN URINE: NEGATIVE
Glucose, UA: NEGATIVE mg/dL
Hgb urine dipstick: NEGATIVE
Ketones, ur: NEGATIVE mg/dL
LEUKOCYTES UA: NEGATIVE
NITRITE: NEGATIVE
Protein, ur: NEGATIVE mg/dL
SPECIFIC GRAVITY, URINE: 1.012 (ref 1.005–1.030)
Urobilinogen, UA: 0.2 mg/dL (ref 0.0–1.0)
pH: 5.5 (ref 5.0–8.0)

## 2013-09-02 LAB — CBC WITH DIFFERENTIAL/PLATELET
BASOS PCT: 0 % (ref 0–1)
Basophils Absolute: 0 10*3/uL (ref 0.0–0.1)
EOS PCT: 1 % (ref 0–5)
Eosinophils Absolute: 0.1 10*3/uL (ref 0.0–0.7)
HEMATOCRIT: 38.7 % (ref 36.0–46.0)
HEMOGLOBIN: 12.8 g/dL (ref 12.0–15.0)
LYMPHS PCT: 10 % — AB (ref 12–46)
Lymphs Abs: 1 10*3/uL (ref 0.7–4.0)
MCH: 32 pg (ref 26.0–34.0)
MCHC: 33.1 g/dL (ref 30.0–36.0)
MCV: 96.8 fL (ref 78.0–100.0)
MONOS PCT: 6 % (ref 3–12)
Monocytes Absolute: 0.6 10*3/uL (ref 0.1–1.0)
NEUTROS ABS: 7.9 10*3/uL — AB (ref 1.7–7.7)
NEUTROS PCT: 83 % — AB (ref 43–77)
Platelets: 185 10*3/uL (ref 150–400)
RBC: 4 MIL/uL (ref 3.87–5.11)
RDW: 15.4 % (ref 11.5–15.5)
WBC Morphology: INCREASED
WBC: 9.6 10*3/uL (ref 4.0–10.5)

## 2013-09-02 LAB — POCT I-STAT 3, ART BLOOD GAS (G3+)
ACID-BASE EXCESS: 4 mmol/L — AB (ref 0.0–2.0)
Acid-Base Excess: 4 mmol/L — ABNORMAL HIGH (ref 0.0–2.0)
Bicarbonate: 32.5 mEq/L — ABNORMAL HIGH (ref 20.0–24.0)
Bicarbonate: 32.3 mEq/L — ABNORMAL HIGH (ref 20.0–24.0)
O2 SAT: 88 %
O2 SAT: 99 %
PCO2 ART: 68.7 mmHg — AB (ref 35.0–45.0)
PO2 ART: 64 mmHg — AB (ref 80.0–100.0)
Patient temperature: 98.6
Patient temperature: 98.4
TCO2: 35 mmol/L (ref 0–100)
TCO2: 34 mmol/L (ref 0–100)
pCO2 arterial: 68.4 mmHg (ref 35.0–45.0)
pH, Arterial: 7.285 — ABNORMAL LOW (ref 7.350–7.450)
pH, Arterial: 7.279 — ABNORMAL LOW (ref 7.350–7.450)
pO2, Arterial: 140 mmHg — ABNORMAL HIGH (ref 80.0–100.0)

## 2013-09-02 LAB — COMPREHENSIVE METABOLIC PANEL
ALK PHOS: 104 U/L (ref 39–117)
ALT: 51 U/L — AB (ref 0–35)
AST: 46 U/L — ABNORMAL HIGH (ref 0–37)
Albumin: 2.4 g/dL — ABNORMAL LOW (ref 3.5–5.2)
BILIRUBIN TOTAL: 0.6 mg/dL (ref 0.3–1.2)
BUN: 30 mg/dL — ABNORMAL HIGH (ref 6–23)
CO2: 29 meq/L (ref 19–32)
Calcium: 8.6 mg/dL (ref 8.4–10.5)
Chloride: 98 mEq/L (ref 96–112)
Creatinine, Ser: 1.37 mg/dL — ABNORMAL HIGH (ref 0.50–1.10)
GFR calc Af Amer: 51 mL/min — ABNORMAL LOW (ref >90)
GFR, EST NON AFRICAN AMERICAN: 44 mL/min — AB (ref >90)
GLUCOSE: 203 mg/dL — AB (ref 70–99)
POTASSIUM: 3.8 meq/L (ref 3.7–5.3)
SODIUM: 141 meq/L (ref 137–147)
Total Protein: 5.9 g/dL — ABNORMAL LOW (ref 6.0–8.3)

## 2013-09-02 LAB — CORTISOL: Cortisol, Plasma: 63.2 ug/dL

## 2013-09-02 LAB — LACTIC ACID, PLASMA: Lactic Acid, Venous: 1.4 mmol/L (ref 0.5–2.2)

## 2013-09-02 LAB — INFLUENZA PANEL BY PCR (TYPE A & B)
H1N1FLUPCR: NOT DETECTED
Influenza A By PCR: NEGATIVE
Influenza B By PCR: NEGATIVE

## 2013-09-02 LAB — POCT I-STAT, CHEM 8
BUN: 29 mg/dL — AB (ref 6–23)
CALCIUM ION: 1.28 mmol/L — AB (ref 1.12–1.23)
Chloride: 100 mEq/L (ref 96–112)
Creatinine, Ser: 1.5 mg/dL — ABNORMAL HIGH (ref 0.50–1.10)
Glucose, Bld: 155 mg/dL — ABNORMAL HIGH (ref 70–99)
HCT: 40 % (ref 36.0–46.0)
HEMOGLOBIN: 13.6 g/dL (ref 12.0–15.0)
Potassium: 3.6 mEq/L — ABNORMAL LOW (ref 3.7–5.3)
Sodium: 141 mEq/L (ref 137–147)
TCO2: 32 mmol/L (ref 0–100)

## 2013-09-02 LAB — POCT I-STAT TROPONIN I: TROPONIN I, POC: 0.01 ng/mL (ref 0.00–0.08)

## 2013-09-02 LAB — PRO B NATRIURETIC PEPTIDE: PRO B NATRI PEPTIDE: 622.9 pg/mL — AB (ref 0–125)

## 2013-09-02 LAB — PHOSPHORUS: Phosphorus: 3.6 mg/dL (ref 2.3–4.6)

## 2013-09-02 LAB — APTT: APTT: 26 s (ref 24–37)

## 2013-09-02 LAB — PROTIME-INR
INR: 0.87 (ref 0.00–1.49)
Prothrombin Time: 11.7 seconds (ref 11.6–15.2)

## 2013-09-02 LAB — PROCALCITONIN: Procalcitonin: 0.81 ng/mL

## 2013-09-02 LAB — GLUCOSE, CAPILLARY
GLUCOSE-CAPILLARY: 101 mg/dL — AB (ref 70–99)
Glucose-Capillary: 177 mg/dL — ABNORMAL HIGH (ref 70–99)
Glucose-Capillary: 128 mg/dL — ABNORMAL HIGH (ref 70–99)

## 2013-09-02 LAB — TROPONIN I: Troponin I: <0.3 ng/mL (ref <0.30)

## 2013-09-02 LAB — STREP PNEUMONIAE URINARY ANTIGEN: Strep Pneumo Urinary Antigen: NEGATIVE

## 2013-09-02 LAB — TRIGLYCERIDES: Triglycerides: 120 mg/dL (ref <150)

## 2013-09-02 MED ORDER — PIPERACILLIN-TAZOBACTAM 3.375 G IVPB
3.3750 g | Freq: Three times a day (TID) | INTRAVENOUS | Status: DC
  Administered 2013-09-02 – 2013-09-06 (×11): 3.375 g via INTRAVENOUS
  Filled 2013-09-02 (×14): qty 50

## 2013-09-02 MED ORDER — SUCCINYLCHOLINE CHLORIDE 20 MG/ML IJ SOLN
INTRAMUSCULAR | Status: AC
  Filled 2013-09-02: qty 1

## 2013-09-02 MED ORDER — PROPOFOL 10 MG/ML IV EMUL
0.0000 ug/kg/min | INTRAVENOUS | Status: DC
  Administered 2013-09-03 (×2): 50 ug/kg/min via INTRAVENOUS
  Administered 2013-09-03: 45 ug/kg/min via INTRAVENOUS
  Administered 2013-09-03 – 2013-09-05 (×7): 50 ug/kg/min via INTRAVENOUS
  Filled 2013-09-02 (×11): qty 100

## 2013-09-02 MED ORDER — SODIUM CHLORIDE 0.9 % IV SOLN
10.0000 ug/h | INTRAVENOUS | Status: DC
  Administered 2013-09-02: 50 ug/h via INTRAVENOUS
  Administered 2013-09-03: 150 ug/h via INTRAVENOUS
  Filled 2013-09-02 (×2): qty 50

## 2013-09-02 MED ORDER — PROPOFOL 10 MG/ML IV EMUL
0.0000 ug/kg/min | INTRAVENOUS | Status: DC
  Administered 2013-09-02: 20 ug/kg/min via INTRAVENOUS
  Filled 2013-09-02: qty 100

## 2013-09-02 MED ORDER — SODIUM CHLORIDE 0.9 % IV SOLN
INTRAVENOUS | Status: DC
  Administered 2013-09-02: 10 mL/h via INTRAVENOUS

## 2013-09-02 MED ORDER — PANTOPRAZOLE SODIUM 40 MG IV SOLR
40.0000 mg | Freq: Every day | INTRAVENOUS | Status: DC
  Administered 2013-09-02 – 2013-09-04 (×3): 40 mg via INTRAVENOUS
  Filled 2013-09-02 (×5): qty 40

## 2013-09-02 MED ORDER — LEVOTHYROXINE SODIUM 100 MCG IV SOLR
75.0000 ug | Freq: Every day | INTRAVENOUS | Status: DC
  Administered 2013-09-03: 11:00:00 via INTRAVENOUS
  Administered 2013-09-04 – 2013-09-05 (×2): 75 ug via INTRAVENOUS
  Administered 2013-09-06: 09:00:00 via INTRAVENOUS
  Filled 2013-09-02 (×4): qty 5

## 2013-09-02 MED ORDER — CHLORHEXIDINE GLUCONATE 0.12 % MT SOLN
15.0000 mL | Freq: Two times a day (BID) | OROMUCOSAL | Status: DC
  Administered 2013-09-02 – 2013-09-08 (×9): 15 mL via OROMUCOSAL
  Filled 2013-09-02 (×11): qty 15

## 2013-09-02 MED ORDER — SODIUM CHLORIDE 0.9 % IV SOLN
INTRAVENOUS | Status: DC
  Administered 2013-09-02: 16:00:00 via INTRAVENOUS

## 2013-09-02 MED ORDER — ROCURONIUM BROMIDE 50 MG/5ML IV SOLN
INTRAVENOUS | Status: AC
  Filled 2013-09-02: qty 2

## 2013-09-02 MED ORDER — ETOMIDATE 2 MG/ML IV SOLN
INTRAVENOUS | Status: AC
  Administered 2013-09-02: 20 mg
  Filled 2013-09-02: qty 20

## 2013-09-02 MED ORDER — FENTANYL CITRATE 0.05 MG/ML IJ SOLN
INTRAMUSCULAR | Status: AC
  Administered 2013-09-02: 100 ug
  Filled 2013-09-02: qty 2

## 2013-09-02 MED ORDER — MIDAZOLAM HCL 2 MG/2ML IJ SOLN
INTRAMUSCULAR | Status: AC
  Administered 2013-09-02: 2 mg
  Filled 2013-09-02: qty 2

## 2013-09-02 MED ORDER — BIOTENE DRY MOUTH MT LIQD
15.0000 mL | Freq: Four times a day (QID) | OROMUCOSAL | Status: DC
  Administered 2013-09-03 – 2013-09-08 (×19): 15 mL via OROMUCOSAL

## 2013-09-02 MED ORDER — HEPARIN SODIUM (PORCINE) 5000 UNIT/ML IJ SOLN
5000.0000 [IU] | Freq: Three times a day (TID) | INTRAMUSCULAR | Status: DC
  Administered 2013-09-02 – 2013-09-05 (×9): 5000 [IU] via SUBCUTANEOUS
  Filled 2013-09-02 (×11): qty 1

## 2013-09-02 MED ORDER — SODIUM CHLORIDE 0.9 % IV SOLN: INTRAVENOUS | Status: DC

## 2013-09-02 MED ORDER — LIDOCAINE HCL (CARDIAC) 20 MG/ML IV SOLN
INTRAVENOUS | Status: AC
  Filled 2013-09-02: qty 5

## 2013-09-02 MED ORDER — INSULIN ASPART 100 UNIT/ML ~~LOC~~ SOLN
0.0000 [IU] | SUBCUTANEOUS | Status: DC
  Administered 2013-09-02: 3 [IU] via SUBCUTANEOUS
  Administered 2013-09-02 – 2013-09-04 (×4): 2 [IU] via SUBCUTANEOUS
  Administered 2013-09-04: 3 [IU] via SUBCUTANEOUS

## 2013-09-02 MED ORDER — VANCOMYCIN HCL IN DEXTROSE 1-5 GM/200ML-% IV SOLN
1000.0000 mg | INTRAVENOUS | Status: DC
  Administered 2013-09-02: 1000 mg via INTRAVENOUS
  Filled 2013-09-02 (×2): qty 200

## 2013-09-02 MED ORDER — PIPERACILLIN-TAZOBACTAM 3.375 G IVPB 30 MIN
3.3750 g | Freq: Once | INTRAVENOUS | Status: AC
  Administered 2013-09-02: 3.375 g via INTRAVENOUS
  Filled 2013-09-02: qty 50

## 2013-09-02 MED ORDER — PROPOFOL 10 MG/ML IV EMUL
5.0000 ug/kg/min | Freq: Once | INTRAVENOUS | Status: AC
  Administered 2013-09-02: 15 ug/kg/min via INTRAVENOUS
  Filled 2013-09-02: qty 100

## 2013-09-02 MED ORDER — SODIUM CHLORIDE 0.9 % IV SOLN
10.0000 ug/h | INTRAVENOUS | Status: DC
  Administered 2013-09-04: 300 ug/h via INTRAVENOUS
  Administered 2013-09-04: 225 ug/h via INTRAVENOUS
  Filled 2013-09-02 (×4): qty 50

## 2013-09-02 NOTE — Progress Notes (Addendum)
ANTIBIOTIC CONSULT NOTE - INITIAL  Pharmacy Consult for Vancomycin and Zosyn Indication: pneumonia  Allergies  Allergen Reactions  . Tape Itching and Rash    Patient Measurements: Height: 5\' 2"  (157.5 cm) Weight: 144 lb 9 oz (65.573 kg) IBW/kg (Calculated) : 50.1  Vital Signs: Temp: 99.2 F (37.3 C) (01/09 1144) Temp src: Oral (01/09 1144) BP: 113/75 mmHg (01/09 1230) Pulse Rate: 111 (01/09 1230) Intake/Output from previous day:   Intake/Output from this shift:    Labs:  Recent Labs  09/02/13 1145 09/02/13 1159  WBC 9.6  --   HGB 12.8 13.6  PLT 185  --   CREATININE  --  1.50*   Estimated Creatinine Clearance: 39.9 ml/min (by C-G formula based on Cr of 1.5). No results found for this basename: VANCOTROUGH, Corlis Leak, VANCORANDOM, [place], [place], [place], [place], TOBRAPEAK, TOBRARND, AMIKACINPEAK, AMIKACINTROU, AMIKACIN,  in the last 72 hours   Microbiology: Recent Results (from the past 720 hour(s))  CULTURE, RESPIRATORY (NON-EXPECTORATED)     Status: None   Collection Time    08/20/13  8:22 PM      Result Value Range Status   Specimen Description TRACHEAL ASPIRATE   Final   Special Requests NONE   Final   Gram Stain     Final   Value: MODERATE WBC PRESENT,BOTH PMN AND MONONUCLEAR     RARE SQUAMOUS EPITHELIAL CELLS PRESENT     RARE YEAST     Performed at [facility]   Culture     Final   Value: FEW CANDIDA ALBICANS     Performed at [facility]   Report Status 08/22/2013 FINAL   Final  MRSA PCR SCREENING     Status: None   Collection Time    08/20/13 10:59 PM      Result Value Range Status   MRSA by PCR NEGATIVE  NEGATIVE Final   Comment:            The GeneXpert MRSA Assay (FDA     approved for NASAL specimens     only), is one component of a     comprehensive MRSA colonization     surveillance program. It is not     intended to diagnose MRSA     infection nor to guide or     monitor treatment for     MRSA  infections.  CULTURE, BLOOD (ROUTINE X 2)     Status: None   Collection Time    08/21/13 12:10 AM      Result Value Range Status   Specimen Description BLOOD LEFT HAND   Final   Special Requests BOTTLES DRAWN AEROBIC ONLY 4CC   Final   Culture  Setup Time     Final   Value: 08/21/2013 14:18     Performed at [facility]   Culture     Final   Value: NO GROWTH 5 DAYS     Performed at [facility]   Report Status 08/27/2013 FINAL   Final  CULTURE, BLOOD (ROUTINE X 2)     Status: None   Collection Time    08/21/13 12:15 AM      Result Value Range Status   Specimen Description BLOOD RIGHT ANTECUBITAL   Final   Special Requests BOTTLES DRAWN AEROBIC ONLY 10CC   Final   Culture  Setup Time     Final   Value: 08/21/2013 14:19     Performed at [facility]  Final   Value: NO GROWTH 5 DAYS     Performed at [facility]   Report Status 08/27/2013 FINAL   Final  RESPIRATORY VIRUS PANEL     Status: Abnormal   Collection Time    08/21/13  1:19 AM      Result Value Range Status   Source - RVPAN NASAL SWAB   Corrected   Comment: CORRECTED ON 12/28 AT 2319: PREVIOUSLY REPORTED AS NASAL SWAB   Respiratory Syncytial Virus A NOT DETECTED   Final   Respiratory Syncytial Virus B NOT DETECTED   Final   Influenza A DETECTED (*)  Final   Influenza B NOT DETECTED   Final   Parainfluenza 1 NOT DETECTED   Final   Parainfluenza 2 NOT DETECTED   Final   Parainfluenza 3 NOT DETECTED   Final   Metapneumovirus NOT DETECTED   Final   Rhinovirus NOT DETECTED   Final   Adenovirus NOT DETECTED   Final   Influenza A H1 NOT DETECTED   Final   Influenza A H3 DETECTED (*)  Final   Comment: (NOTE)           Normal Reference Range for each Analyte: NOT DETECTED     Testing performed using the Luminex xTAG Respiratory Viral Panel test     kit.     This test was developed and its performance characteristics determined     by [facility]. It has not been  cleared or approved by the [place]     Food and Drug Administration. This test is used for clinical purposes.     It should not be regarded as investigational or for research. This     laboratory is certified under the [place] (CLIA) as qualified to perform high complexity     clinical laboratory testing.     Performed at [facility] History: Past Medical History  Diagnosis Date  . Right-sided heart failure   . COPD (chronic obstructive pulmonary disease)   . Emphysema   . Seizures   . Thyroid disease   . Depression   . Emphysema   . Arthritis     back and legs  . Blood transfusion without reported diagnosis   . High cholesterol   . Anxiety   . Nerves   . Panic attack   . Thyroid disorder     Medications:  See electronic med rec  Assessment: 53 y.o. female presents with SOB. Noted pt just d/c 08/26/13 after being treated for PNA, influenza - just finished course of Augmentin/Doxycycline on 1/6. To begin Vancomycin and Zosyn for PNA. Noted pt also received Zosyn 3.375gm IV x 1 in ED.  Goal of Therapy:  Vancomycin trough level 15-20 mcg/ml  Plan:  1. Vancomycin 1gm IV q24h. 2. Zosyn 3.375gm IV q8h. Each dose over 4 hours. 3. F/u micro data, renal function, trough prn   Sherlon Handing, PharmD, BCPS Clinical pharmacist, pager [phone] 09/02/2013,12:54 PM

## 2013-09-02 NOTE — Progress Notes (Signed)
[facility] Progress Note Patient Name: [name] DOB: 06/17/1961 MRN: [id number]  Date of Service  09/02/2013   HPI/Events of Note   resp acidosis Mild autoPEEP on RR 18  eICU Interventions  Maintain current RR   Intervention Category Major Interventions: Acid-Base disturbance - evaluation and management  Anadia Helmes V. 09/02/2013, 4:14 PM

## 2013-09-02 NOTE — ED Notes (Signed)
NP attempting central line placement

## 2013-09-02 NOTE — ED Notes (Signed)
Pts purse, medications, upper plate dentures and underwear given to husband Oswaldo Milian.

## 2013-09-02 NOTE — ED Provider Notes (Signed)
CSN: [id number]     Arrival date & time 09/02/13  1133 History   First MD Initiated Contact with Patient 09/02/13 1141     Chief Complaint  Patient presents with  . Respiratory Distress   05 caveat due to respiratory distress and altered mental status. (Consider location/radiation/quality/duration/timing/severity/associated sxs/prior Treatment) The history is provided by the patient.   patient comes in in respiratory distress. She has a history of severe COPD. She was recently admitted to the ICU and intubated. She has been on antibiotics at home. She states she's recently began to get worse. She was initially hypoxic for EMS. She was started on BiPAP and had her oxygenation improved. She is on chronic oxygen at home. Unknown fevers. Patient states she's been feeling worse over the last few days.  Past Medical History  Diagnosis Date  . Right-sided heart failure   . COPD (chronic obstructive pulmonary disease)   . Emphysema   . Seizures   . Thyroid disease   . Depression   . Emphysema   . Arthritis     back and legs  . Blood transfusion without reported diagnosis   . High cholesterol   . Anxiety   . Nerves   . Panic attack   . Thyroid disorder    No past surgical history on file. No family history on file. History  Substance Use Topics  . Smoking status: Former Smoker    Quit date: 08/02/2013  . Smokeless tobacco: Not on file  . Alcohol Use: No   OB History   Grav Para Term Preterm Abortions TAB SAB Ect Mult Living                 Review of Systems  Unable to perform ROS   Allergies  Tape  Home Medications  No current outpatient prescriptions on file. BP 120/76  Pulse 74  Temp(Src) 98.1 F (36.7 C) (Core (Comment))  Resp 18  Ht 5\' 2"  (1.575 m)  Wt 148 lb 9.4 oz (67.4 kg)  BMI 27.17 kg/m2  SpO2 94% Physical Exam  Constitutional: She appears well-developed.  HENT:  Head: Normocephalic.  Eyes: Pupils are equal, round, and reactive to light.   Cardiovascular: Normal rate.   Pulmonary/Chest:  Tachypnea. Some respiratory distress. Patient is on BiPAP. Few scattered rales. Some expiratory wheezes.  Musculoskeletal:  Mild bilateral lower extremity pitting edema  Neurological:  Mildly decreased mental status. Will answer some questions but is also slow to answer.  Skin: Skin is warm.    ED Course  Procedures (including critical care time) Labs Review Labs Reviewed  CBC WITH DIFFERENTIAL - Abnormal; Notable for the following:    Neutrophils Relative % 83 (*)    Lymphocytes Relative 10 (*)    Neutro Abs 7.9 (*)    All other components within normal limits  PRO B NATRIURETIC PEPTIDE - Abnormal; Notable for the following:    Pro B Natriuretic peptide (BNP) 622.9 (*)    All other components within normal limits  COMPREHENSIVE METABOLIC PANEL - Abnormal; Notable for the following:    Glucose, Bld 203 (*)    BUN 30 (*)    Creatinine, Ser 1.37 (*)    Total Protein 5.9 (*)    Albumin 2.4 (*)    AST 46 (*)    ALT 51 (*)    GFR calc non Af Amer 44 (*)    GFR calc Af Amer 51 (*)    All other components within normal limits  BLOOD  GAS, ARTERIAL - Abnormal; Notable for the following:    pCO2 arterial 54.6 (*)    pO2, Arterial 75.0 (*)    Bicarbonate 33.9 (*)    Acid-Base Excess 9.1 (*)    All other components within normal limits  GLUCOSE, CAPILLARY - Abnormal; Notable for the following:    Glucose-Capillary 177 (*)    All other components within normal limits  GLUCOSE, CAPILLARY - Abnormal; Notable for the following:    Glucose-Capillary 128 (*)    All other components within normal limits  GLUCOSE, CAPILLARY - Abnormal; Notable for the following:    Glucose-Capillary 101 (*)    All other components within normal limits  GLUCOSE, CAPILLARY - Abnormal; Notable for the following:    Glucose-Capillary 123 (*)    All other components within normal limits  GLUCOSE, CAPILLARY - Abnormal; Notable for the following:     Glucose-Capillary 101 (*)    All other components within normal limits  CBC - Abnormal; Notable for the following:    WBC 11.5 (*)    RBC 3.51 (*)    Hemoglobin 11.0 (*)    HCT 33.1 (*)    All other components within normal limits  BASIC METABOLIC PANEL - Abnormal; Notable for the following:    Potassium 3.5 (*)    CO2 33 (*)    Glucose, Bld 103 (*)    BUN 29 (*)    Creatinine, Ser 1.21 (*)    Calcium 8.0 (*)    GFR calc non Af Amer 51 (*)    GFR calc Af Amer 59 (*)    All other components within normal limits  GLUCOSE, CAPILLARY - Abnormal; Notable for the following:    Glucose-Capillary 103 (*)    All other components within normal limits  CBC - Abnormal; Notable for the following:    RBC 3.24 (*)    Hemoglobin 10.2 (*)    HCT 30.7 (*)    All other components within normal limits  BASIC METABOLIC PANEL - Abnormal; Notable for the following:    Potassium 3.3 (*)    CO2 34 (*)    BUN 27 (*)    Creatinine, Ser 1.11 (*)    Calcium 7.9 (*)    GFR calc non Af Amer 57 (*)    GFR calc Af Amer 66 (*)    All other components within normal limits  GLUCOSE, CAPILLARY - Abnormal; Notable for the following:    Glucose-Capillary 123 (*)    All other components within normal limits  GLUCOSE, CAPILLARY - Abnormal; Notable for the following:    Glucose-Capillary 114 (*)    All other components within normal limits  GLUCOSE, CAPILLARY - Abnormal; Notable for the following:    Glucose-Capillary 106 (*)    All other components within normal limits  POCT I-STAT, CHEM 8 - Abnormal; Notable for the following:    Potassium 3.6 (*)    BUN 29 (*)    Creatinine, Ser 1.50 (*)    Glucose, Bld 155 (*)    Calcium, Ion 1.28 (*)    All other components within normal limits  POCT I-STAT 3, BLOOD GAS (G3+) - Abnormal; Notable for the following:    pH, Arterial 7.285 (*)    pCO2 arterial 68.4 (*)    pO2, Arterial 64.0 (*)    Bicarbonate 32.5 (*)    Acid-Base Excess 4.0 (*)    All other  components within normal limits  POCT I-STAT 3, BLOOD GAS (G3+) -  Abnormal; Notable for the following:    pH, Arterial 7.279 (*)    pCO2 arterial 68.7 (*)    pO2, Arterial 140.0 (*)    Bicarbonate 32.3 (*)    Acid-Base Excess 4.0 (*)    All other components within normal limits  CULTURE, BLOOD (ROUTINE X 2)  CULTURE, BLOOD (ROUTINE X 2)  URINE CULTURE  CULTURE, RESPIRATORY (NON-EXPECTORATED)  MAGNESIUM  PHOSPHORUS  LACTIC ACID, PLASMA  PROCALCITONIN  CORTISOL  PROTIME-INR  APTT  URINALYSIS, ROUTINE W REFLEX MICROSCOPIC  STREP PNEUMONIAE URINARY ANTIGEN  LEGIONELLA ANTIGEN, URINE  INFLUENZA PANEL BY PCR (TYPE A & B, H1N1)  TROPONIN I  TRIGLYCERIDES  GLUCOSE, CAPILLARY  BLOOD GAS, ARTERIAL  VARICELLA-ZOSTER BY PCR  POCT I-STAT TROPONIN I   Imaging Review Dg Chest Portable 1 View  09/02/2013   CLINICAL DATA:  Respiratory distress  EXAM: PORTABLE CHEST - 1 VIEW  COMPARISON:  September 02, 2013 11:45 a.m.  FINDINGS: Endotracheal tube is identified distal tip 2.1 cm from carina. Left jugular central venous line is noted with distal tip in the superior vena cava. Nasogastric tube is noted with distal tip not included film is the least in the stomach. There are bibasilar consolidations. The previously noted nodular opacity in the right perihilar region is not seen. The heart size is normal. The osseous structures are stable.  IMPRESSION: Persistent bibasilar consolidation. Previously noted nodular opacity in the right perihilar region not seen currently. Life supporting devices as described.   Electronically Signed   By: Abelardo Diesel M.D.   On: 09/02/2013 15:12   Dg Chest Port 1 View  09/02/2013   CLINICAL DATA:  Dyspnea and weakness  EXAM: PORTABLE CHEST - 1 VIEW  COMPARISON:  Portable chest x-ray of August 24, 2013  FINDINGS: The lungs are adequately inflated. The interstitial markings are increased bilaterally. These become nearly confluent in the right mid and lower lung. An area of  subtle nodular density in the right perihilar region is demonstrated as well. The cardiopericardial silhouette is mildly enlarged. The pulmonary vascularity is not engorged. The trachea is midline. No pleural effusion is evident.  IMPRESSION: The findings are consistent with interstitial pneumonia bilaterally. The infiltrates become nearly confluent in the right lower lung. In addition there is an area of confluent density in the right perihilar region which is new. A followup PA and lateral chest x-ray following therapy is recommended to assure clearing. Chest CT scanning may be ultimately indicated to exclude any underlying nodule or mass.   Electronically Signed   By: David  Martinique   On: 09/02/2013 11:59    EKG Interpretation    Date/Time:  Friday September 02 2013 11:49:09 EST Ventricular Rate:  118 PR Interval:  128 QRS Duration: 99 QT Interval:  289 QTC Calculation: 405 R Axis:   174 Text Interpretation:  Sinus or ectopic atrial tachycardia Multiple premature complexes, vent  Baseline wander in lead(s) II aVF Confirmed by Isaac Lacson  MD, Fallon Howerter ([id number]) on 09/04/2013 7:06:45 AM            MDM   1. HCAP (healthcare-associated pneumonia)   2. Respiratory failure with hypercapnia   3. Acute-on-chronic respiratory failure   4. COPD with respiratory failure, acute    Patient with worsening respiratory function. Recently admitted and intubated. X-ray shows possible interstitial pneumonia. She started on antibiotics. She continued to have worsening respiratory function. She appeared as if she would require intubation, however she refused it from me. We waited an ABG that  showed hypercarbia worse than her baseline while she was on BiPAP. Critical care discussed with her in she decided to be intubated. She'll be admitted to the ICU.  CRITICAL CARE Performed by: Mackie Pai Total critical care time: 30  Critical care time was exclusive of separately billable procedures and treating  other patients. Critical care was necessary to treat or prevent imminent or life-threatening deterioration. Critical care was time spent personally by me on the following activities: development of treatment plan with patient and/or surrogate as well as nursing, discussions with consultants, evaluation of patient's response to treatment, examination of patient, obtaining history from patient or surrogate, ordering and performing treatments and interventions, ordering and review of laboratory studies, ordering and review of radiographic studies, pulse oximetry and re-evaluation of patient's condition.   Jasper Riling. Alvino Chapel, [place] 09/04/13 [phone]

## 2013-09-02 NOTE — Care Management Note (Addendum)
    Page 1 of 1   09/09/2013     1:49:49 PM   CARE MANAGEMENT NOTE 09/09/2013  Patient:  [name], [name]   Account Number:  [id number]  Date Initiated:  09/02/2013  Documentation initiated by:  Elissa Hefty  Subjective/Objective Assessment:   adm w hypoxia     Action/Plan:   lives w husband   Anticipated DC Date:     Anticipated DC Plan:           Choice offered to / List presented to:             Status of service:   Medicare Important Message given?   (If response is "NO", the following Medicare IM given date fields will be blank) Date Medicare IM given:   Date Additional Medicare IM given:    Discharge Disposition:    Per UR Regulation:  Reviewed for med. necessity/level of care/duration of stay  If discussed at [place] of Stay Meetings, dates discussed:   09/08/2013    Comments:  08-30-13 Patient was  active with [facility] for wound care prior to admission  . PT recommending HHPT . Will need orders for both at discharge . Thanks Magdalen Spatz RN BSN [phone]

## 2013-09-02 NOTE — ED Notes (Signed)
Pt arrives via EMS from home with SOB that began this AM. Pts initial 02 upper 70s on RA. Pt with recent hospital admissions for pneumonia, prev. Smoker, CHF, COPD. 20g RAC, 1 nitro, 1 albuterol, 1 duoneb, 125mg  solumedrol on CPAP on arrival. Pt with labored resp, inspiratory and expiratory wheezes, coarse throughout. Alert, oriented x4.

## 2013-09-02 NOTE — Procedures (Signed)
Intubation Procedure Note MARYSOL WELLNITZ [id number] 12/31/1960  Procedure: Intubation Indications: Respiratory insufficiency  Procedure Details Consent: Risks of procedure as well as the alternatives and risks of each were explained to the (patient/caregiver).  Consent for procedure obtained. Time Out: Verified patient identification, verified procedure, site/side was marked, verified correct patient position, special equipment/implants available, medications/allergies/relevent history reviewed, required imaging and test results available.  Performed  3 Medications:  Fentanyl 100 mcg Etomidate 20 mg Versed 2 mg NMB    Evaluation Hemodynamic Status: BP stable throughout; O2 sats: stable throughout Patient's Current Condition: stable Complications: No apparent complications Patient did tolerate procedure well. Chest X-ray ordered to verify placement.  CXR: pending.   Richardson Landry Minor ACNP Maryanna Shape PCCM Pager [phone] till 3 pm If no answer page [phone] 09/02/2013, 2:42 PM  [place]  [phone]  Cell  [phone]  If no response or cell goes to voicemail, call beeper [phone]

## 2013-09-02 NOTE — Procedures (Signed)
Central Venous Catheter Insertion Procedure Note [name] [id number] 1961/07/25  Procedure: Insertion of Central Venous Catheter Indications: Assessment of intravascular volume, Drug and/or fluid administration and Frequent blood sampling  Procedure Details Consent: Risks of procedure as well as the alternatives and risks of each were explained to the (patient/caregiver).  Consent for procedure obtained. Time Out: Verified patient identification, verified procedure, site/side was marked, verified correct patient position, special equipment/implants available, medications/allergies/relevent history reviewed, required imaging and test results available.  Performed  Maximum sterile technique was used including antiseptics, cap, gloves, gown, hand hygiene, mask and sheet. Skin prep: Chlorhexidine; local anesthetic administered A antimicrobial bonded/coated triple lumen catheter was placed in the left internal jugular vein using the Seldinger technique. Ultrasound guidance used.yes Catheter placed to 20 cm. Blood aspirated via all 3 ports and then flushed x 3. Line sutured x 2 and dressing applied.  Evaluation Blood flow good Complications: No apparent complications Patient did tolerate procedure well. Chest X-ray ordered to verify placement.  CXR: pending.  Richardson Landry Minor ACNP Maryanna Shape PCCM Pager [phone] till 3 pm If no answer page [phone] 09/02/2013, 2:43 PM   [place]  [phone]  Cell  [phone]  If no response or cell goes to voicemail, call beeper [phone]

## 2013-09-02 NOTE — ED Notes (Signed)
Bedside portable chest xray done

## 2013-09-02 NOTE — H&P (Signed)
Name: [name] MRN: [id number] DOB: [birth year]    ADMISSION DATE:  09/02/2013   REFERRING MD :  EDP PRIMARY SERVICE: PCCM  CHIEF COMPLAINT:  resp failure  BRIEF PATIENT DESCRIPTION:  53 yo wf(looks much older) just discharged 1-2 from [facility] health with hypercarbic resp failure, pna, chronic pain and FTT.  SIGNIFICANT EVENTS / STUDIES:  1-9 intubated 1/9 CVL IJ  LINES / TUBES: 1-9 ott>> 1-9 lt i j cvl>>  CULTURES: 1-9 sputum>> 1-9 bc>> 1-9 uc>>  ANTIBIOTICS: 1-9 vanc>> 1-9 zoysn>>  HISTORY OF PRESENT ILLNESS:   53 yo wf(looks much older) just discharged 08/26/13 from [facility] with hypercarbic resp failure, pna, chronic pain and FTT. She presents to [facility] ED 1-8 in hypercarbic resp failure with bilateral pna. She will require intubation, abx coverage for HCAI and admission to ICU.  PAST MEDICAL HISTORY :  Past Medical History  Diagnosis Date  . Right-sided heart failure   . COPD (chronic obstructive pulmonary disease)   . Emphysema   . Seizures   . Thyroid disease   . Depression   . Emphysema   . Arthritis     back and legs  . Blood transfusion without reported diagnosis   . High cholesterol   . Anxiety   . Nerves   . Panic attack   . Thyroid disorder    No past surgical history on file. Prior to Admission medications   Medication Sig Start Date End Date Taking? Authorizing Provider  ALPRAZolam Duanne Moron) 0.5 MG tablet Take 0.5 mg by mouth 2 (two) times daily as needed for anxiety.    Historical Provider, MD  amoxicillin-clavulanate (AUGMENTIN) 875-125 MG per tablet Take 1 tablet by mouth 2 (two) times daily. Started 08/13/13, for 14 days 08/26/13   Donita Brooks, NP  Calcium Carb-Cholecalciferol (CALCIUM 600 + D PO) Take 1 tablet by mouth 2 (two) times daily.    Historical Provider, MD  diltiazem (TIAZAC) 300 MG 24 hr capsule Take 300 mg by mouth daily.    Historical Provider, MD  doxycycline (VIBRA-TABS) 100 MG tablet Take 1 tablet (100 mg total) by  mouth 2 (two) times daily. 08/26/13   Donita Brooks, NP  DULoxetine (CYMBALTA) 60 MG capsule Take 60 mg by mouth 2 (two) times daily.    Historical Provider, MD  ferrous sulfate 325 (65 FE) MG EC tablet Take 325 mg by mouth daily.    Historical Provider, MD  Fluticasone-Salmeterol (ADVAIR) 250-50 MCG/DOSE AEPB Inhale 1 puff into the lungs every 12 (twelve) hours.    Historical Provider, MD  levothyroxine (SYNTHROID, LEVOTHROID) 150 MCG tablet Take 150 mcg by mouth daily.    Historical Provider, MD  Linaclotide Rolan Lipa) 145 MCG CAPS capsule Take 145 mcg by mouth daily.    Historical Provider, MD  loratadine (CLARITIN) 10 MG tablet Take 10 mg by mouth at bedtime.    Historical Provider, MD  nicotine (NICODERM CQ - DOSED IN MG/24 HOURS) 21 mg/24hr patch Place 1 patch (21 mg total) onto the skin daily. 08/26/13   Donita Brooks, NP  Oxycodone HCl 10 MG TABS Take 0.5 tablets (5 mg total) by mouth every 6 (six) hours as needed (for pain). 08/26/13   Donita Brooks, NP  pantoprazole (PROTONIX) 40 MG tablet Take 1 tablet (40 mg total) by mouth 2 (two) times daily. 06/27/13   Tiffany L Reed, DO  predniSONE (DELTASONE) 10 MG tablet 5 tabs for 3 days, then 4 tabs for 3 days,  then 3 tabs for 3 days, then 2 tabs for 3 days, then 1 tab for 3 days. 08/27/13   Donita Brooks, NP  pregabalin (LYRICA) 100 MG capsule Take 100 mg by mouth 3 (three) times daily.    Historical Provider, MD  tiotropium (SPIRIVA) 18 MCG inhalation capsule Place 18 mcg into inhaler and inhale daily.    Historical Provider, MD  traZODone (DESYREL) 150 MG tablet Take 150 mg by mouth at bedtime.    Historical Provider, MD  vitamin B-12 (CYANOCOBALAMIN) 1000 MCG tablet Take 5,000 mcg by mouth daily.    Historical Provider, MD   Allergies  Allergen Reactions  . Tape Itching and Rash    FAMILY HISTORY:  No family history on file. SOCIAL HISTORY:  reports that she quit smoking about 4 weeks ago. She does not have any smokeless tobacco history on  file. She reports that she does not drink alcohol or use illicit drugs.  REVIEW OF SYSTEMS:  Not able to obtain  SUBJECTIVE:  In mild resp distress VITAL SIGNS: Temp:  [99.2 F (37.3 C)] 99.2 F (37.3 C) (01/09 1144) Pulse Rate:  [111-121] 111 (01/09 1230) Resp:  [11-35] 15 (01/09 1230) BP: (95-113)/(67-75) 113/75 mmHg (01/09 1230) SpO2:  [87 %-91 %] 89 % (01/09 1230) Weight:  [144 lb 9 oz (65.573 kg)] 144 lb 9 oz (65.573 kg) (01/09 1230) HEMODYNAMICS:   VENTILATOR SETTINGS: Full vent support   INTAKE / OUTPUT: Intake/Output   None     PHYSICAL EXAMINATION: General:  MOWF  Somnolent  without stimulation Neuro:  *Lthargic but follows commands HEENT:  Pupil pinpoint Cardiovascular:  HSR RRR Lungs:  Decreased air movement despite nimvs Abdomen:  Obese + bs Musculoskeletal:  intact Skin:  warm  LABS:  CBC  Recent Labs Lab 09/02/13 1145 09/02/13 1159  WBC 9.6  --   HGB 12.8 13.6  HCT 38.7 40.0  PLT 185  --    Coag's No results found for this basename: APTT, INR,  in the last 168 hours BMET  Recent Labs Lab 09/02/13 1159  NA 141  K 3.6*  CL 100  BUN 29*  CREATININE 1.50*  GLUCOSE 155*   Electrolytes No results found for this basename: CALCIUM, MG, PHOS,  in the last 168 hours Sepsis Markers No results found for this basename: LATICACIDVEN, PROCALCITON, O2SATVEN,  in the last 168 hours ABG  Recent Labs Lab 09/02/13 1328  PHART 7.285*  PCO2ART 68.4*  PO2ART 64.0*   Liver Enzymes No results found for this basename: AST, ALT, ALKPHOS, BILITOT, ALBUMIN,  in the last 168 hours Cardiac Enzymes  Recent Labs Lab 09/02/13 1145  PROBNP 622.9*   Glucose No results found for this basename: GLUCAP,  in the last 168 hours  Imaging Dg Chest Port 1 View  09/02/2013   CLINICAL DATA:  Dyspnea and weakness  EXAM: PORTABLE CHEST - 1 VIEW  COMPARISON:  Portable chest x-ray of August 24, 2013  FINDINGS: The lungs are adequately inflated. The interstitial  markings are increased bilaterally. These become nearly confluent in the right mid and lower lung. An area of subtle nodular density in the right perihilar region is demonstrated as well. The cardiopericardial silhouette is mildly enlarged. The pulmonary vascularity is not engorged. The trachea is midline. No pleural effusion is evident.  IMPRESSION: The findings are consistent with interstitial pneumonia bilaterally. The infiltrates become nearly confluent in the right lower lung. In addition there is an area of confluent density in the right  perihilar region which is new. A followup PA and lateral chest x-ray following therapy is recommended to assure clearing. Chest CT scanning may be ultimately indicated to exclude any underlying nodule or mass.   Electronically Signed   By: David  Martinique   On: 09/02/2013 11:59     CXR: *see above  ASSESSMENT / PLAN:  PULMONARY A:Acute on chronic hypercarbic resp failure with bilateral pna, suspected component of narcotic use for chronic pain. P:   Intubate Vent bundle Abx BD's  CARDIOVASCULAR A:No acute issue P:  Check trop and 12 lead for completeness  RENAL Lab Results  Component Value Date   CREATININE 1.50* 09/02/2013   CREATININE 0.87 08/23/2013   CREATININE 0.97 08/22/2013    A:  Renal insuff P:   Trend creatinine  GASTROINTESTINAL A: GERD P:   PPI Early TF  HEMATOLOGIC A:  No acute issue P:  monitor  INFECTIOUS A: Pna with recent hospitaization P:   tx as HCAI See flows  ENDOCRINE A:  Hypothroid   P:   On synthroid 150 mcg po start iv at half dose   NEUROLOGIC A:  AMS consistent with metabolic hypercarbia P:   CT head if no improvement  Richardson Landry Minor ACNP Maryanna Shape PCCM Pager [phone] till 3 pm If no answer page [phone] 09/02/2013, 1:37 PM  CC 5min I have seen and examined this pt with Minor ACNP>  I agree with the above note. Mariel Sleet Beeper  [phone]  Cell  [phone]  If no response or  cell goes to voicemail, call beeper [phone]

## 2013-09-02 NOTE — ED Notes (Addendum)
Pt continues with shallow breathing, O2 sats 90-92% while on Bipap. Richardson Landry, Critical Care NP at bedside to update family plan of care to intubate pt if condition does not improve. Second IV started left hand. Pt continues to be lethargic but arousable.

## 2013-09-03 DIAGNOSIS — J189 Pneumonia, unspecified organism: Secondary | ICD-10-CM

## 2013-09-03 DIAGNOSIS — J9620 Acute and chronic respiratory failure, unspecified whether with hypoxia or hypercapnia: Secondary | ICD-10-CM

## 2013-09-03 DIAGNOSIS — J449 Chronic obstructive pulmonary disease, unspecified: Secondary | ICD-10-CM

## 2013-09-03 LAB — BLOOD GAS, ARTERIAL
Acid-Base Excess: 9.1 mmol/L — ABNORMAL HIGH (ref 0.0–2.0)
Bicarbonate: 33.9 mEq/L — ABNORMAL HIGH (ref 20.0–24.0)
DRAWN BY: 28701
FIO2: 0.6 %
LHR: 18 {breaths}/min
MECHVT: 440 mL
O2 Saturation: 94.7 %
PCO2 ART: 54.6 mmHg — AB (ref 35.0–45.0)
PEEP: 5 cmH2O
PH ART: 7.41 (ref 7.350–7.450)
Patient temperature: 98.6
TCO2: 35.6 mmol/L (ref 0–100)
pO2, Arterial: 75 mmHg — ABNORMAL LOW (ref 80.0–100.0)

## 2013-09-03 LAB — BASIC METABOLIC PANEL
BUN: 29 mg/dL — ABNORMAL HIGH (ref 6–23)
CALCIUM: 8 mg/dL — AB (ref 8.4–10.5)
CO2: 33 meq/L — AB (ref 19–32)
CREATININE: 1.21 mg/dL — AB (ref 0.50–1.10)
Chloride: 99 mEq/L (ref 96–112)
GFR calc Af Amer: 59 mL/min — ABNORMAL LOW (ref >90)
GFR, EST NON AFRICAN AMERICAN: 51 mL/min — AB (ref >90)
Glucose, Bld: 103 mg/dL — ABNORMAL HIGH (ref 70–99)
Potassium: 3.5 mEq/L — ABNORMAL LOW (ref 3.7–5.3)
Sodium: 143 mEq/L (ref 137–147)

## 2013-09-03 LAB — URINE CULTURE
COLONY COUNT: NO GROWTH
CULTURE: NO GROWTH

## 2013-09-03 LAB — LEGIONELLA ANTIGEN, URINE: LEGIONELLA ANTIGEN, URINE: NEGATIVE

## 2013-09-03 LAB — CBC
HCT: 33.1 % — ABNORMAL LOW (ref 36.0–46.0)
Hemoglobin: 11 g/dL — ABNORMAL LOW (ref 12.0–15.0)
MCH: 31.3 pg (ref 26.0–34.0)
MCHC: 33.2 g/dL (ref 30.0–36.0)
MCV: 94.3 fL (ref 78.0–100.0)
Platelets: 164 10*3/uL (ref 150–400)
RBC: 3.51 MIL/uL — ABNORMAL LOW (ref 3.87–5.11)
RDW: 15.4 % (ref 11.5–15.5)
WBC: 11.5 10*3/uL — ABNORMAL HIGH (ref 4.0–10.5)

## 2013-09-03 LAB — GLUCOSE, CAPILLARY
GLUCOSE-CAPILLARY: 123 mg/dL — AB (ref 70–99)
Glucose-Capillary: 101 mg/dL — ABNORMAL HIGH (ref 70–99)
Glucose-Capillary: 103 mg/dL — ABNORMAL HIGH (ref 70–99)
Glucose-Capillary: 86 mg/dL (ref 70–99)

## 2013-09-03 MED ORDER — ALBUTEROL SULFATE (2.5 MG/3ML) 0.083% IN NEBU: 5.0000 mg | INHALATION_SOLUTION | RESPIRATORY_TRACT | Status: DC | PRN

## 2013-09-03 MED ORDER — PREDNISONE 20 MG PO TABS
40.0000 mg | ORAL_TABLET | Freq: Every day | ORAL | Status: DC
  Administered 2013-09-03 – 2013-09-05 (×3): 40 mg via ORAL
  Filled 2013-09-03 (×4): qty 2

## 2013-09-03 MED ORDER — PREDNISONE 20 MG PO TABS
40.0000 mg | ORAL_TABLET | Freq: Every day | ORAL | Status: DC
  Filled 2013-09-03: qty 2

## 2013-09-03 MED ORDER — IPRATROPIUM BROMIDE 0.02 % IN SOLN
2.5000 mL | RESPIRATORY_TRACT | Status: DC
Start: 2013-09-03 — End: 2013-09-05
  Administered 2013-09-04 – 2013-09-05 (×4): 0.5 mg via RESPIRATORY_TRACT

## 2013-09-03 MED ORDER — ALBUTEROL SULFATE HFA 108 (90 BASE) MCG/ACT IN AERS
6.0000 | INHALATION_SPRAY | RESPIRATORY_TRACT | Status: DC | PRN
  Filled 2013-09-03: qty 6.7

## 2013-09-03 MED ORDER — ALBUTEROL SULFATE (5 MG/ML) 0.5% IN NEBU
1.0000 mL | INHALATION_SOLUTION | RESPIRATORY_TRACT | Status: DC
  Administered 2013-09-04 – 2013-09-05 (×4): 1 mL via RESPIRATORY_TRACT
  Filled 2013-09-03: qty 1

## 2013-09-03 MED ORDER — DEXTROSE 5 % IV SOLN
10.0000 mg/kg | Freq: Two times a day (BID) | INTRAVENOUS | Status: DC
  Administered 2013-09-03 – 2013-09-04 (×3): 570 mg via INTRAVENOUS
  Filled 2013-09-03 (×4): qty 11.4

## 2013-09-03 MED ORDER — VANCOMYCIN HCL 500 MG IV SOLR
500.0000 mg | Freq: Two times a day (BID) | INTRAVENOUS | Status: DC
  Administered 2013-09-03 – 2013-09-05 (×4): 500 mg via INTRAVENOUS
  Filled 2013-09-03 (×5): qty 500

## 2013-09-03 MED ORDER — VITAL HIGH PROTEIN PO LIQD
1000.0000 mL | ORAL | Status: DC
  Administered 2013-09-03: 1000 mL
  Filled 2013-09-03 (×5): qty 1000

## 2013-09-03 NOTE — Progress Notes (Signed)
PULMONARY / CRITICAL CARE MEDICINE   Name: [name] MRN: [id number] DOB: 1961-05-07    ADMISSION DATE:  09/02/2013   REFERRING MD :  EDP PRIMARY SERVICE: PCCM  CHIEF COMPLAINT:  Acute respiratory failure  BRIEF PATIENT DESCRIPTION: 53 yo with recent recently discharged form [place] readmitted with pneumonia and acute respiratory failure.   SIGNIFICANT EVENTS / STUDIES:   LINES / TUBES: OETT 1/9 >>> OGT 1/9 >>> L IJ CVL 1/9 >>> Foley 1/9 >>>  CULTURES: Respiratory 1/9 >>> Urine 1/9 >>> Blood 1/9 >>>  ANTIBIOTICS: Vancomycin 1/9 >>> Zosyn 1/9 >>> Acyclovir 1/10 >>>  SUBJECTIVE: Interval history.  VITAL SIGNS: Temp:  [97.5 F (36.4 C)-99.2 F (37.3 C)] 99 F (37.2 C) (01/10 0900) Pulse Rate:  [85-121] 90 (01/10 0900) Resp:  [11-35] 18 (01/10 0900) BP: (95-155)/(61-102) 136/81 mmHg (01/10 0900) SpO2:  [87 %-97 %] 94 % (01/10 0900) FiO2 (%):  [60 %-100 %] 60 % (01/10 0737) Weight:  [65.573 kg (144 lb 9 oz)-67.3 kg (148 lb 5.9 oz)] 67.3 kg (148 lb 5.9 oz) (01/10 0343)  HEMODYNAMICS: CVP:  [2 mmHg-9 mmHg] 9 mmHg  VENTILATOR SETTINGS: Vent Mode:  [-] PRVC FiO2 (%):  [60 %-100 %] 60 % Set Rate:  [18 bmp] 18 bmp Vt Set:  [440 mL-550 mL] 440 mL PEEP:  [5 cmH20] 5 cmH20 Plateau Pressure:  [13 cmH20-21 cmH20] 14 cmH20  INTAKE / OUTPUT: Intake/Output     01/09 0701 - 01/10 0700 01/10 0701 - 01/11 0700   I.V. (mL/kg) 1465.6 (21.8) 208.5 (3.1)   IV Piggyback  100   Total Intake(mL/kg) 1465.6 (21.8) 308.5 (4.6)   Urine (mL/kg/hr) 1900 275 (1.5)   Emesis/NG output 375    Total Output 2275 275   Net -809.4 +33.5         General:  Appears acutely ill, mechanically ventilated, synchronous Neuro:  Encephalopathic, nonfocal, cough / gag diminished HEENT:  PERRL, OETT / OGT Cardiovascular:  RRR, no m/r/g Lungs:  Bilateral diminished air entry, no w/r/r Abdomen:  Soft, nontender, bowel sounds diminished Musculoskeletal:  Moves all extremities, no edema Skin:   Vesicular rash on erythematous base - lower back  LABS:  CBC  Recent Labs Lab 09/02/13 1145 09/02/13 1159  WBC 9.6  --   HGB 12.8 13.6  HCT 38.7 40.0  PLT 185  --    Coag's  Recent Labs Lab 09/02/13 1541  APTT 26  INR 0.87   BMET  Recent Labs Lab 09/02/13 1159 09/02/13 1541  NA 141 141  K 3.6* 3.8  CL 100 98  CO2  --  29  BUN 29* 30*  CREATININE 1.50* 1.37*  GLUCOSE 155* 203*   Electrolytes  Recent Labs Lab 09/02/13 1541  CALCIUM 8.6  MG 1.7  PHOS 3.6   Sepsis Markers  Recent Labs Lab 09/02/13 1530 09/02/13 1541  LATICACIDVEN 1.4  --   PROCALCITON  --  0.81   ABG  Recent Labs Lab 09/02/13 1328 09/02/13 1604 09/03/13 0420  PHART 7.285* 7.279* 7.410  PCO2ART 68.4* 68.7* 54.6*  PO2ART 64.0* 140.0* 75.0*   Liver Enzymes  Recent Labs Lab 09/02/13 1541  AST 46*  ALT 51*  ALKPHOS 104  BILITOT 0.6  ALBUMIN 2.4*   Cardiac Enzymes  Recent Labs Lab 09/02/13 1145 09/02/13 1530  TROPONINI  --  <0.30  PROBNP 622.9*  --    Glucose  Recent Labs Lab 09/02/13 1627 09/02/13 1940 09/02/13 2333 09/03/13 0334 09/03/13 0801  GLUCAP 177*  128* 101* 123* 101*   CXR: None today.  ASSESSMENT / PLAN:  PULMONARY A: Acute respiratory failure. HCAP. AECOPD. P:   Goal pH>7.30, SpO2>92 Continuous mechanical support VAP bundle Daily SBT Trend ABG/CXR Add Albuterol / Atrovent Add Prednisone 40  CARDIOVASCULAR A: Sinus tachycardia. SIRS? P:  Goal MAP>65  RENAL A:   AKI. P:   Trend BMP NS@75   GASTROINTESTINAL A:  GERD. Nutrition. P:   PPI Start TF per Nutritionist  HEMATOLOGIC A:   VTE Px. P:  Trend CBC Heparin [place]  INFECTIOUS A:  HCAP. Shingles? P:   Abx / cx as above Start Acyclovir    ENDOCRINE A:   Hypothyroidism. Hyperglycemia. P:   SSI Synthroid  NEUROLOGIC A:  Acute encephalopathy. P:   Goal RASS 0 to -1 Fentanyl / Propofol Daily WUA  I have personally obtained history, examined  patient, evaluated and interpreted laboratory and imaging results, reviewed medical records, formulated assessment / plan and placed orders.  CRITICAL CARE:  The patient is critically ill with multiple organ systems failure and requires high complexity decision making for assessment and support, frequent evaluation and titration of therapies, application of advanced monitoring technologies and extensive interpretation of multiple databases. Critical Care Time devoted to patient care services described in this note is 35 minutes.   Doree Fudge, MD Pulmonary and [place] Pager: [phone]  09/03/2013, 9:44 AM

## 2013-09-03 NOTE — H&P (Deleted)
PULMONARY / CRITICAL CARE MEDICINE   Name: [name] MRN: [id number] DOB: 1961/01/05    ADMISSION DATE:  09/02/2013   REFERRING MD :  EDP PRIMARY SERVICE: PCCM  CHIEF COMPLAINT:  Acute respiratory failure  BRIEF PATIENT DESCRIPTION: 53 yo with recent recently discharged form [place] readmitted with pneumonia and acute respiratory failure.   SIGNIFICANT EVENTS / STUDIES:   LINES / TUBES: OETT 1/9 >>> OGT 1/9 >>> L IJ CVL 1/9 >>> Foley 1/9 >>>  CULTURES: Respiratory 1/9 >>> Urine 1/9 >>> Blood 1/9 >>>  ANTIBIOTICS: Vancomycin 1/9 >>> Zosyn 1/9 >>> Acyclovir 1/10 >>>  SUBJECTIVE: Interval history.  VITAL SIGNS: Temp:  [97.5 F (36.4 C)-99.2 F (37.3 C)] 99 F (37.2 C) (01/10 0900) Pulse Rate:  [85-121] 90 (01/10 0900) Resp:  [11-35] 18 (01/10 0900) BP: (95-155)/(61-102) 136/81 mmHg (01/10 0900) SpO2:  [87 %-97 %] 94 % (01/10 0900) FiO2 (%):  [60 %-100 %] 60 % (01/10 0737) Weight:  [65.573 kg (144 lb 9 oz)-67.3 kg (148 lb 5.9 oz)] 67.3 kg (148 lb 5.9 oz) (01/10 0343)  HEMODYNAMICS: CVP:  [2 mmHg-9 mmHg] 9 mmHg  VENTILATOR SETTINGS: Vent Mode:  [-] PRVC FiO2 (%):  [60 %-100 %] 60 % Set Rate:  [18 bmp] 18 bmp Vt Set:  [440 mL-550 mL] 440 mL PEEP:  [5 cmH20] 5 cmH20 Plateau Pressure:  [13 cmH20-21 cmH20] 14 cmH20  INTAKE / OUTPUT: Intake/Output     01/09 0701 - 01/10 0700 01/10 0701 - 01/11 0700   I.V. (mL/kg) 1465.6 (21.8) 208.5 (3.1)   IV Piggyback  100   Total Intake(mL/kg) 1465.6 (21.8) 308.5 (4.6)   Urine (mL/kg/hr) 1900 275 (1.7)   Emesis/NG output 375    Total Output 2275 275   Net -809.4 +33.5         General:  Appears acutely ill, mechanically ventilated, synchronous Neuro:  Encephalopathic, nonfocal, cough / gag diminished HEENT:  PERRL, OETT / OGT Cardiovascular:  RRR, no m/r/g Lungs:  Bilateral diminished air entry, no w/r/r Abdomen:  Soft, nontender, bowel sounds diminished Musculoskeletal:  Moves all extremities, no edema Skin:   Vesicular rash on erythematous base - lower back  LABS:  CBC  Recent Labs Lab 09/02/13 1145 09/02/13 1159  WBC 9.6  --   HGB 12.8 13.6  HCT 38.7 40.0  PLT 185  --    Coag's  Recent Labs Lab 09/02/13 1541  APTT 26  INR 0.87   BMET  Recent Labs Lab 09/02/13 1159 09/02/13 1541  NA 141 141  K 3.6* 3.8  CL 100 98  CO2  --  29  BUN 29* 30*  CREATININE 1.50* 1.37*  GLUCOSE 155* 203*   Electrolytes  Recent Labs Lab 09/02/13 1541  CALCIUM 8.6  MG 1.7  PHOS 3.6   Sepsis Markers  Recent Labs Lab 09/02/13 1530 09/02/13 1541  LATICACIDVEN 1.4  --   PROCALCITON  --  0.81   ABG  Recent Labs Lab 09/02/13 1328 09/02/13 1604 09/03/13 0420  PHART 7.285* 7.279* 7.410  PCO2ART 68.4* 68.7* 54.6*  PO2ART 64.0* 140.0* 75.0*   Liver Enzymes  Recent Labs Lab 09/02/13 1541  AST 46*  ALT 51*  ALKPHOS 104  BILITOT 0.6  ALBUMIN 2.4*   Cardiac Enzymes  Recent Labs Lab 09/02/13 1145 09/02/13 1530  TROPONINI  --  <0.30  PROBNP 622.9*  --    Glucose  Recent Labs Lab 09/02/13 1627 09/02/13 1940 09/02/13 2333 09/03/13 0334 09/03/13 0801  GLUCAP 177*  128* 101* 123* 101*   CXR: None today.  ASSESSMENT / PLAN:  PULMONARY A: Acute respiratory failure. HCAP. P:   Goal pH>7.30, SpO2>92 Continuous mechanical support VAP bundle Daily SBT Trend ABG/CXR Albuterol / Atrovent  CARDIOVASCULAR A: Sinus tachycardia. SIRS? P:  Goal MAP>65  RENAL A:   AKI. P:   Trend BMP NS@75   GASTROINTESTINAL A:  GERD. Nutrition. P:   PPI Start TF per Nutritionist  HEMATOLOGIC A:   VTE Px. P:  Trend CBC Heparin [place]  INFECTIOUS A:  HCAP. Shingles? P:   Abx / cx as above Start Acyclovir    ENDOCRINE A:   Hypothyroidism. Hyperglycemia. P:   SSI Synthroid  NEUROLOGIC A:  Acute encephalopathy. P:   Goal RASS 0 to -1 Fentanyl / Propofol Daily WUA  I have personally obtained history, examined patient, evaluated and interpreted  laboratory and imaging results, reviewed medical records, formulated assessment / plan and placed orders.  CRITICAL CARE:  The patient is critically ill with multiple organ systems failure and requires high complexity decision making for assessment and support, frequent evaluation and titration of therapies, application of advanced monitoring technologies and extensive interpretation of multiple databases. Critical Care Time devoted to patient care services described in this note is 35 minutes.   Doree Fudge, MD Pulmonary and [place] Pager: [phone]  09/03/2013, 9:35 AM

## 2013-09-03 NOTE — Progress Notes (Addendum)
ANTIBIOTIC CONSULT NOTE - Initial/Follow-Up  Pharmacy Consult for Acyclovir, Vancomycin, Zosyn Indication: Suspected varicella-zoster, PNA  Allergies  Allergen Reactions  . Tape Itching and Rash    Patient Measurements: Height: 5\' 2"  (157.5 cm) Weight: 148 lb 5.9 oz (67.3 kg) IBW/kg (Calculated) : 50.1  Vital Signs: Temp: 98.6 F (37 C) (01/10 1000) Temp src: Core (Comment) (01/10 0737) BP: 128/75 mmHg (01/10 1000) Pulse Rate: 91 (01/10 1000) Intake/Output from previous day: 01/09 0701 - 01/10 0700 In: 1465.6 [I.V.:1465.6] Out: 2275 [Urine:1900; Emesis/NG output:375] Intake/Output from this shift: Total I/O In: 308.5 [I.V.:208.5; IV Piggyback:100] Out: 275 [Urine:275]  Labs:  Recent Labs  09/02/13 1145 09/02/13 1159 09/02/13 1541  WBC 9.6  --   --   HGB 12.8 13.6  --   PLT 185  --   --   CREATININE  --  1.50* 1.37*   Estimated Creatinine Clearance: 44.2 ml/min (by C-G formula based on Cr of 1.37). No results found for this basename: VANCOTROUGH, Corlis Leak, VANCORANDOM, [place], [place], [place], [place], TOBRAPEAK, TOBRARND, AMIKACINPEAK, AMIKACINTROU, AMIKACIN,  in the last 72 hours   Microbiology: Recent Results (from the past 720 hour(s))  CULTURE, RESPIRATORY (NON-EXPECTORATED)     Status: None   Collection Time    08/20/13  8:22 PM      Result Value Range Status   Specimen Description TRACHEAL ASPIRATE   Final   Special Requests NONE   Final   Gram Stain     Final   Value: MODERATE WBC PRESENT,BOTH PMN AND MONONUCLEAR     RARE SQUAMOUS EPITHELIAL CELLS PRESENT     RARE YEAST     Performed at [facility]   Culture     Final   Value: FEW CANDIDA ALBICANS     Performed at [facility]   Report Status 08/22/2013 FINAL   Final  MRSA PCR SCREENING     Status: None   Collection Time    08/20/13 10:59 PM      Result Value Range Status   MRSA by PCR NEGATIVE  NEGATIVE Final   Comment:            The GeneXpert MRSA Assay (FDA      approved for NASAL specimens     only), is one component of a     comprehensive MRSA colonization     surveillance program. It is not     intended to diagnose MRSA     infection nor to guide or     monitor treatment for     MRSA infections.  CULTURE, BLOOD (ROUTINE X 2)     Status: None   Collection Time    08/21/13 12:10 AM      Result Value Range Status   Specimen Description BLOOD LEFT HAND   Final   Special Requests BOTTLES DRAWN AEROBIC ONLY 4CC   Final   Culture  Setup Time     Final   Value: 08/21/2013 14:18     Performed at [facility]   Culture     Final   Value: NO GROWTH 5 DAYS     Performed at [facility]   Report Status 08/27/2013 FINAL   Final  CULTURE, BLOOD (ROUTINE X 2)     Status: None   Collection Time    08/21/13 12:15 AM      Result Value Range Status   Specimen Description BLOOD RIGHT ANTECUBITAL   Final   Special Requests BOTTLES DRAWN AEROBIC  ONLY 10CC   Final   Culture  Setup Time     Final   Value: 08/21/2013 14:19     Performed at [facility]   Culture     Final   Value: NO GROWTH 5 DAYS     Performed at [facility]   Report Status 08/27/2013 FINAL   Final  RESPIRATORY VIRUS PANEL     Status: Abnormal   Collection Time    08/21/13  1:19 AM      Result Value Range Status   Source - RVPAN NASAL SWAB   Corrected   Comment: CORRECTED ON 12/28 AT 2319: PREVIOUSLY REPORTED AS NASAL SWAB   Respiratory Syncytial Virus A NOT DETECTED   Final   Respiratory Syncytial Virus B NOT DETECTED   Final   Influenza A DETECTED (*)  Final   Influenza B NOT DETECTED   Final   Parainfluenza 1 NOT DETECTED   Final   Parainfluenza 2 NOT DETECTED   Final   Parainfluenza 3 NOT DETECTED   Final   Metapneumovirus NOT DETECTED   Final   Rhinovirus NOT DETECTED   Final   Adenovirus NOT DETECTED   Final   Influenza A H1 NOT DETECTED   Final   Influenza A H3 DETECTED (*)  Final   Comment: (NOTE)           Normal Reference Range  for each Analyte: NOT DETECTED     Testing performed using the Luminex xTAG Respiratory Viral Panel test     kit.     This test was developed and its performance characteristics determined     by [facility]. It has not been cleared or approved by the [place]     Food and Drug Administration. This test is used for clinical purposes.     It should not be regarded as investigational or for research. This     laboratory is certified under the [place] (CLIA) as qualified to perform high complexity     clinical laboratory testing.     Performed at [facility] History: Past Medical History  Diagnosis Date  . Right-sided heart failure   . COPD (chronic obstructive pulmonary disease)   . Emphysema   . Seizures   . Thyroid disease   . Depression   . Emphysema   . Arthritis     back and legs  . Blood transfusion without reported diagnosis   . High cholesterol   . Anxiety   . Nerves   . Panic attack   . Thyroid disorder    Assessment: 53 y.o. female presents with SOB. She was just d/c on 08/26/13 after being treated for PNA and influenza (just finished course of Augmentin/Doxycycline on 1/6). Pharmacy was consulted to dose vancomycin and Zosyn for PNA. Now, pharmacy has been consulted to dose acyclovir for suspected varicella-zoster. Renal function has improved since 1/9. CrCl~3mL/min.  1/9 Vanc>> 1/9 Zosyn>> 1/10 Acyclovir >>  1/9 BCx >> ngtd 1/9 TA >> gram neg rods  Goal of Therapy:  Eradication of infection  Plan:  - Start acyclovir $RemoveBeforeD'570mg'NgLIYrZmEXZntI$  IV every 12h ($RemoveBe'10mg'RTeGjePgC$ /kg based on ABW) - Change vancomycin IV to $Rem'500mg'FpaQ$  q12h - Continue zosyn IV 3.375g q8h - Monitor renal function, Tmax, and clinical progression  Hines Kloss A. Pincus Badder, PharmD Clinical Pharmacist - Resident Pager: [phone] Pharmacy: [phone] 09/03/2013 10:31 AM

## 2013-09-03 NOTE — Progress Notes (Signed)
INITIAL NUTRITION ASSESSMENT  DOCUMENTATION CODES Per approved criteria  -Not Applicable   INTERVENTION: Initiate Vital High Protein @ 20 ml/hr via OGT and increase by 10 ml every 4 hours to goal rate of 45 ml/hr.  At goal rate, tube feeding regimen will provide 1080 kcal, 95 grams of protein, and 903 ml of H2O.  TF plus current rate of propofol will provide 1558 kcal.    NUTRITION DIAGNOSIS: Inadequate oral intake related to inability to eat as evidenced by NPO status.   Goal: Pt to meet >/= 90% of their estimated nutrition needs   Monitor:  TF initiation/tolerance Weight trends Labs Vent status  Reason for Assessment: Consult for TF initiation/management  53 y.o. female  Admitting Dx: Acute-on-chronic respiratory failure  ASSESSMENT: 53 yo wf just discharged 1-2 from [facility] with hypercarbic resp failure, pna, chronic pain and FTT. Pt has history of COPD, emphysema, high cholesterol, and depression.  Pt on vent with propofol running.    Height: Ht Readings from Last 1 Encounters:  09/02/13 5\' 2"  (1.575 m)    Weight: Wt Readings from Last 1 Encounters:  09/03/13 148 lb 5.9 oz (67.3 kg)    Ideal Body Weight: 110 lbs  % Ideal Body Weight: 135%  Wt Readings from Last 10 Encounters:  09/03/13 148 lb 5.9 oz (67.3 kg)  08/26/13 144 lb 9.6 oz (65.59 kg)  08/15/13 152 lb 6.4 oz (69.128 kg)    Usual Body Weight: 152 lbs  % Usual Body Weight: 97%  BMI:  Body mass index is 27.13 kg/(m^2).  Patient is currently intubated on ventilator support.  MV: 8.2 L/min Temp (24hrs), Avg:98.3 F (36.8 C), Min:97.5 F (36.4 C), Max:99.2 F (37.3 C)  Propofol: 18.1 ml/hr (provides 478 kcal per 24 hours)   Estimated Nutritional Needs: Kcal: 1471 Protein: 90-105 grams Fluid: 2.2 L/day  Skin: +1 generalized edema, +1 RUE and LUE edema, +3 RLE and LLE edema  Diet Order: NPO  EDUCATION NEEDS: -No education needs identified at this time   Intake/Output Summary  (Last 24 hours) at 09/03/13 1120 Last data filed at 09/03/13 1100  Gross per 24 hour  Intake 1990.34 ml  Output   2775 ml  Net -784.66 ml    Last BM: PTA  Labs:   Recent Labs Lab 09/02/13 1159 09/02/13 1541  NA 141 141  K 3.6* 3.8  CL 100 98  CO2  --  29  BUN 29* 30*  CREATININE 1.50* 1.37*  CALCIUM  --  8.6  MG  --  1.7  PHOS  --  3.6  GLUCOSE 155* 203*    CBG (last 3)   Recent Labs  09/02/13 2333 09/03/13 0334 09/03/13 0801  GLUCAP 101* 123* 101*    Scheduled Meds: . acyclovir  10 mg/kg (Adjusted) Intravenous Q12H  . albuterol  1 mL Inhalation Q4H  . antiseptic oral rinse  15 mL Mouth Rinse QID  . chlorhexidine  15 mL Mouth Rinse BID  . heparin  5,000 Units Subcutaneous Q8H  . insulin aspart  0-15 Units Subcutaneous Q4H  . ipratropium  2.5 mL Inhalation Q4H  . levothyroxine  75 mcg Intravenous Daily  . pantoprazole (PROTONIX) IV  40 mg Intravenous QHS  . piperacillin-tazobactam (ZOSYN)  IV  3.375 g Intravenous Q8H  . predniSONE  40 mg Oral Q breakfast  . vancomycin  1,000 mg Intravenous Q24H    Continuous Infusions: . sodium chloride 75 mL/hr at 09/03/13 1100  . sodium chloride    .  sodium chloride 10 mL/hr at 09/03/13 1100  . fentaNYL infusion INTRAVENOUS 50 mcg/hr (09/02/13 1445)  . fentaNYL infusion INTRAVENOUS 50 mcg/hr (09/03/13 1100)  . propofol 44.891 mcg/kg/min (09/03/13 1100)    Past Medical History  Diagnosis Date  . Right-sided heart failure   . COPD (chronic obstructive pulmonary disease)   . Emphysema   . Seizures   . Thyroid disease   . Depression   . Emphysema   . Arthritis     back and legs  . Blood transfusion without reported diagnosis   . High cholesterol   . Anxiety   . Nerves   . Panic attack   . Thyroid disorder     No past surgical history on file.  Pryor Ochoa RD, LDN Inpatient Clinical Dietitian Pager: [phone] After Hours Pager: [phone]

## 2013-09-04 ENCOUNTER — Inpatient Hospital Stay (HOSPITAL_COMMUNITY): Payer: Medicare Other

## 2013-09-04 LAB — BASIC METABOLIC PANEL
BUN: 27 mg/dL — ABNORMAL HIGH (ref 6–23)
CO2: 34 mEq/L — ABNORMAL HIGH (ref 19–32)
Calcium: 7.9 mg/dL — ABNORMAL LOW (ref 8.4–10.5)
Chloride: 101 mEq/L (ref 96–112)
Creatinine, Ser: 1.11 mg/dL — ABNORMAL HIGH (ref 0.50–1.10)
GFR calc Af Amer: 66 mL/min — ABNORMAL LOW (ref >90)
GFR calc non Af Amer: 57 mL/min — ABNORMAL LOW (ref >90)
GLUCOSE: 88 mg/dL (ref 70–99)
POTASSIUM: 3.3 meq/L — AB (ref 3.7–5.3)
SODIUM: 144 meq/L (ref 137–147)

## 2013-09-04 LAB — CBC
HCT: 30.7 % — ABNORMAL LOW (ref 36.0–46.0)
HEMOGLOBIN: 10.2 g/dL — AB (ref 12.0–15.0)
MCH: 31.5 pg (ref 26.0–34.0)
MCHC: 33.2 g/dL (ref 30.0–36.0)
MCV: 94.8 fL (ref 78.0–100.0)
Platelets: 161 10*3/uL (ref 150–400)
RBC: 3.24 MIL/uL — AB (ref 3.87–5.11)
RDW: 15.4 % (ref 11.5–15.5)
WBC: 9.5 10*3/uL (ref 4.0–10.5)

## 2013-09-04 LAB — GLUCOSE, CAPILLARY
GLUCOSE-CAPILLARY: 114 mg/dL — AB (ref 70–99)
GLUCOSE-CAPILLARY: 123 mg/dL — AB (ref 70–99)
Glucose-Capillary: 106 mg/dL — ABNORMAL HIGH (ref 70–99)
Glucose-Capillary: 124 mg/dL — ABNORMAL HIGH (ref 70–99)

## 2013-09-04 MED ORDER — SODIUM CHLORIDE 0.9 % IV BOLUS (SEPSIS)
500.0000 mL | Freq: Once | INTRAVENOUS | Status: AC
  Administered 2013-09-04: 500 mL via INTRAVENOUS

## 2013-09-04 MED ORDER — DEXTROSE 5 % IV SOLN
10.0000 mg/kg | Freq: Three times a day (TID) | INTRAVENOUS | Status: DC
  Administered 2013-09-04 – 2013-09-06 (×5): 570 mg via INTRAVENOUS
  Filled 2013-09-04 (×7): qty 11.4

## 2013-09-04 MED ORDER — SODIUM CHLORIDE 0.9 % IV SOLN
INTRAVENOUS | Status: AC
  Administered 2013-09-04: 500 mL/h via INTRAVENOUS

## 2013-09-04 MED ORDER — FUROSEMIDE 10 MG/ML IJ SOLN
40.0000 mg | Freq: Two times a day (BID) | INTRAMUSCULAR | Status: AC
  Administered 2013-09-04 (×2): 40 mg via INTRAVENOUS
  Filled 2013-09-04 (×2): qty 4

## 2013-09-04 MED ORDER — POTASSIUM CHLORIDE 20 MEQ/15ML (10%) PO LIQD
40.0000 meq | Freq: Once | ORAL | Status: AC
  Administered 2013-09-04 (×2): 40 meq
  Filled 2013-09-04: qty 30

## 2013-09-04 MED ORDER — POTASSIUM CHLORIDE 10 MEQ/50ML IV SOLN
10.0000 meq | INTRAVENOUS | Status: AC
  Administered 2013-09-04 (×2): 10 meq via INTRAVENOUS
  Filled 2013-09-04: qty 50

## 2013-09-04 MED ORDER — SODIUM CHLORIDE 0.9 % IV SOLN
INTRAVENOUS | Status: DC
Start: 2013-09-04 — End: 2013-09-04

## 2013-09-04 MED ORDER — POTASSIUM CHLORIDE 20 MEQ/15ML (10%) PO LIQD
ORAL | Status: AC
  Administered 2013-09-04: 09:00:00 40 meq
  Filled 2013-09-04: qty 30

## 2013-09-04 NOTE — Progress Notes (Signed)
PULMONARY / CRITICAL CARE MEDICINE  Name: [name] MRN: [id number] DOB: 1961-08-16    ADMISSION DATE:  09/02/2013   REFERRING MD :  EDP PRIMARY SERVICE: PCCM  CHIEF COMPLAINT:  Acute respiratory failure  BRIEF PATIENT DESCRIPTION: 53 yo with recent recently discharged form [place] readmitted with pneumonia and acute respiratory failure.   SIGNIFICANT EVENTS / STUDIES:   LINES / TUBES: OETT 1/9 >>> OGT 1/9 >>> L IJ CVL 1/9 >>> Foley 1/9 >>>  CULTURES: MRSA 1/9 >>> neg Respiratory 1/9 >>> Urine 1/9 >>> neg Blood 1/9 >>> Viral panel 1/9 >>> FLU A 1/10  Varicella PCR >>>   ANTIBIOTICS: Vancomycin 1/9 >>> Zosyn 1/9 >>> Acyclovir 1/10 >>>  SUBJECTIVE: No overnight issues.  VITAL SIGNS: Temp:  [98.1 F (36.7 C)-99 F (37.2 C)] 98.1 F (36.7 C) (01/11 0400) Pulse Rate:  [74-105] 75 (01/11 0743) Resp:  [14-29] 18 (01/11 0743) BP: (105-139)/(65-93) 110/71 mmHg (01/11 0743) SpO2:  [91 %-97 %] 95 % (01/11 0743) FiO2 (%):  [50 %-60 %] 60 % (01/11 0743) Weight:  [67.4 kg (148 lb 9.4 oz)] 67.4 kg (148 lb 9.4 oz) (01/11 0423)  HEMODYNAMICS: CVP:  [8 mmHg-10 mmHg] 10 mmHg  VENTILATOR SETTINGS: Vent Mode:  [-] PRVC FiO2 (%):  [50 %-60 %] 60 % Set Rate:  [18 bmp] 18 bmp Vt Set:  [440 mL] 440 mL PEEP:  [5 cmH20] 5 cmH20 Plateau Pressure:  [14 cmH20-151 cmH20] 24 cmH20  INTAKE / OUTPUT: Intake/Output     01/10 0701 - 01/11 0700 01/11 0701 - 01/12 0700   I.V. (mL/kg) 2613.3 (38.8)    NG/GT 319.3    IV Piggyback 425    Total Intake(mL/kg) 3357.6 (49.8)    Urine (mL/kg/hr) 2325 (1.4)    Emesis/NG output 400 (0.2)    Total Output 2725     Net +632.6           General:  Mechanically ventilated, synchronous Neuro:  Sedated, wakes up to stimulation HEENT:  PERRL, OETT / OGT Cardiovascular:  RRR, no m/r/g Lungs:  Bilateral air entry, rales Abdomen:  Soft, nontender, bowel sounds diminished Musculoskeletal:  Moves all extremities, bilateral lower extremity  edema. Skin:  Vesicular rash on erythematous base - lower back  LABS:  CBC  Recent Labs Lab 09/02/13 1145 09/02/13 1159 09/03/13 0937 09/04/13 0300  WBC 9.6  --  11.5* 9.5  HGB 12.8 13.6 11.0* 10.2*  HCT 38.7 40.0 33.1* 30.7*  PLT 185  --  164 161   Coag's  Recent Labs Lab 09/02/13 1541  APTT 26  INR 0.87   BMET  Recent Labs Lab 09/02/13 1541 09/03/13 0937 09/04/13 0300  NA 141 143 144  K 3.8 3.5* 3.3*  CL 98 99 101  CO2 29 33* 34*  BUN 30* 29* 27*  CREATININE 1.37* 1.21* 1.11*  GLUCOSE 203* 103* 88   Electrolytes  Recent Labs Lab 09/02/13 1541 09/03/13 0937 09/04/13 0300  CALCIUM 8.6 8.0* 7.9*  MG 1.7  --   --   PHOS 3.6  --   --    Sepsis Markers  Recent Labs Lab 09/02/13 1530 09/02/13 1541  LATICACIDVEN 1.4  --   PROCALCITON  --  0.81   ABG  Recent Labs Lab 09/02/13 1328 09/02/13 1604 09/03/13 0420  PHART 7.285* 7.279* 7.410  PCO2ART 68.4* 68.7* 54.6*  PO2ART 64.0* 140.0* 75.0*   Liver Enzymes  Recent Labs Lab 09/02/13 1541  AST 46*  ALT 51*  ALKPHOS 104  BILITOT 0.6  ALBUMIN 2.4*   Cardiac Enzymes  Recent Labs Lab 09/02/13 1145 09/02/13 1530  TROPONINI  --  <0.30  PROBNP 622.9*  --    Glucose  Recent Labs Lab 09/03/13 0801 09/03/13 1150 09/03/13 1631 09/03/13 2013 09/03/13 2338 09/04/13 0421  GLUCAP 101* 103* 86 123* 114* 106*   CXR: 1/11 >>> Hardware in good position, bilateral airspace disease.  ASSESSMENT / PLAN:  PULMONARY A: Acute on chronic respiratory failure. Influenza pneumonitis. Superinfection, HCAP. AECOPD. P:   Goal pH>7.30, SpO2>92 Continuous mechanical support VAP bundle Daily SBT Trend ABG/CXR Albuterol / Atrovent Prednisone 40  CARDIOVASCULAR A: Sinus tachycardia. SIRS? P:  Goal MAP>65  RENAL A:   AKI. Hypokalemia. P:   Trend BMP K 40 x 1 Lasix 40 q12h x 2 D/c NS  GASTROINTESTINAL A:  GERD. Nutrition. P:   PPI TF per Nutritionist  HEMATOLOGIC A:    VTE Px. P:  Trend CBC Heparin [place]  INFECTIOUS A:  HCAP. Shingles? P:   Abx / cx as above Acyclovir   ENDOCRINE A:   Hypothyroidism. Hyperglycemia. P:   SSI Synthroid  NEUROLOGIC A:  Acute encephalopathy. P:   Goal RASS 0 to -1 Fentanyl / Propofol gtt Daily WUA  I have personally obtained history, examined patient, evaluated and interpreted laboratory and imaging results, reviewed medical records, formulated assessment / plan and placed orders.  CRITICAL CARE:  The patient is critically ill with multiple organ systems failure and requires high complexity decision making for assessment and support, frequent evaluation and titration of therapies, application of advanced monitoring technologies and extensive interpretation of multiple databases. Critical Care Time devoted to patient care services described in this note is 35 minutes.   Doree Fudge, MD Pulmonary and [place] Pager: [phone]  09/04/2013, 8:46 AM

## 2013-09-04 NOTE — Progress Notes (Signed)
Per Martinique Perkins, RN. MD ordered chest XRAY and following reading request ETT to be pulled back 1cm. Tube was at 24cm, Now at 23cm.

## 2013-09-04 NOTE — Progress Notes (Signed)
[facility] ADULT ICU REPLACEMENT PROTOCOL FOR AM LAB REPLACEMENT ONLY  The patient doesapply for the [facility] Adult ICU Electrolyte Replacment Protocol based on the criteria listed below:   1. Is GFR >/= 40 ml/min? yes  Patient's GFR today is 57 2. Is urine output >/= 0.5 ml/kg/hr for the last 6 hours? yes Patient's UOP is 1.33 ml/kg/hr 3. Is BUN < 60 mg/dL? yes  Patient's BUN today is 27 4. Abnormal electrolyte(s): K+ 3.3 5. Ordered repletion with: see order 6. If a panic level lab has been reported, has the CCM MD in charge been notified? yes.   Physician:  Dr. Jodi Geralds, Colbey Wirtanen A 09/04/2013 5:07 AM

## 2013-09-04 NOTE — Progress Notes (Signed)
ANTIBIOTIC CONSULT NOTE - FOLLOW UP  Pharmacy Consult for Acyclovir Indication: Suspected varicella-zoster  Allergies  Allergen Reactions  . Tape Itching and Rash    Patient Measurements: Height: 5\' 2"  (157.5 cm) Weight: 148 lb 9.4 oz (67.4 kg) IBW/kg (Calculated) : 50.1 Adjusted Body Weight: 57 kg  Vital Signs: Temp: 98.2 F (36.8 C) (01/11 1230) Temp src: Core (Comment) (01/11 1230) BP: 102/60 mmHg (01/11 1500) Pulse Rate: 73 (01/11 1500) Intake/Output from previous day: 01/10 0701 - 01/11 0700 In: 3377.6 [I.V.:2633.3; NG/GT:319.3; IV Piggyback:425] Out: 2725 [Urine:2325; Emesis/NG output:400] Intake/Output from this shift: Total I/O In: 1226 [I.V.:456.8; NG/GT:435; IV Piggyback:334.2] Out: 875 [Urine:875]  Labs:  Recent Labs  09/02/13 1145  09/02/13 1159 09/02/13 1541 09/03/13 0937 09/04/13 0300  WBC 9.6  --   --   --  11.5* 9.5  HGB 12.8  --  13.6  --  11.0* 10.2*  PLT 185  --   --   --  164 161  CREATININE  --   < > 1.50* 1.37* 1.21* 1.11*  < > = values in this interval not displayed. Estimated Creatinine Clearance: 54.6 ml/min (by C-G formula based on Cr of 1.11). No results found for this basename: VANCOTROUGH, Corlis Leak, VANCORANDOM, [place], [place], [place], [place], TOBRAPEAK, TOBRARND, AMIKACINPEAK, AMIKACINTROU, AMIKACIN,  in the last 72 hours   Microbiology: Recent Results (from the past 720 hour(s))  CULTURE, RESPIRATORY (NON-EXPECTORATED)     Status: None   Collection Time    08/20/13  8:22 PM      Result Value Range Status   Specimen Description TRACHEAL ASPIRATE   Final   Special Requests NONE   Final   Gram Stain     Final   Value: MODERATE WBC PRESENT,BOTH PMN AND MONONUCLEAR     RARE SQUAMOUS EPITHELIAL CELLS PRESENT     RARE YEAST     Performed at [facility]   Culture     Final   Value: FEW CANDIDA ALBICANS     Performed at [facility]   Report Status 08/22/2013 FINAL   Final  MRSA PCR SCREENING      Status: None   Collection Time    08/20/13 10:59 PM      Result Value Range Status   MRSA by PCR NEGATIVE  NEGATIVE Final   Comment:            The GeneXpert MRSA Assay (FDA     approved for NASAL specimens     only), is one component of a     comprehensive MRSA colonization     surveillance program. It is not     intended to diagnose MRSA     infection nor to guide or     monitor treatment for     MRSA infections.  CULTURE, BLOOD (ROUTINE X 2)     Status: None   Collection Time    08/21/13 12:10 AM      Result Value Range Status   Specimen Description BLOOD LEFT HAND   Final   Special Requests BOTTLES DRAWN AEROBIC ONLY 4CC   Final   Culture  Setup Time     Final   Value: 08/21/2013 14:18     Performed at [facility]   Culture     Final   Value: NO GROWTH 5 DAYS     Performed at [facility]   Report Status 08/27/2013 FINAL   Final  CULTURE, BLOOD (ROUTINE X 2)  Status: None   Collection Time    08/21/13 12:15 AM      Result Value Range Status   Specimen Description BLOOD RIGHT ANTECUBITAL   Final   Special Requests BOTTLES DRAWN AEROBIC ONLY 10CC   Final   Culture  Setup Time     Final   Value: 08/21/2013 14:19     Performed at [facility]   Culture     Final   Value: NO GROWTH 5 DAYS     Performed at [facility]   Report Status 08/27/2013 FINAL   Final  RESPIRATORY VIRUS PANEL     Status: Abnormal   Collection Time    08/21/13  1:19 AM      Result Value Range Status   Source - RVPAN NASAL SWAB   Corrected   Comment: CORRECTED ON 12/28 AT 2319: PREVIOUSLY REPORTED AS NASAL SWAB   Respiratory Syncytial Virus A NOT DETECTED   Final   Respiratory Syncytial Virus B NOT DETECTED   Final   Influenza A DETECTED (*)  Final   Influenza B NOT DETECTED   Final   Parainfluenza 1 NOT DETECTED   Final   Parainfluenza 2 NOT DETECTED   Final   Parainfluenza 3 NOT DETECTED   Final   Metapneumovirus NOT DETECTED   Final   Rhinovirus NOT  DETECTED   Final   Adenovirus NOT DETECTED   Final   Influenza A H1 NOT DETECTED   Final   Influenza A H3 DETECTED (*)  Final   Comment: (NOTE)           Normal Reference Range for each Analyte: NOT DETECTED     Testing performed using the Luminex xTAG Respiratory Viral Panel test     kit.     This test was developed and its performance characteristics determined     by [facility]. It has not been cleared or approved by the [place]     Food and Drug Administration. This test is used for clinical purposes.     It should not be regarded as investigational or for research. This     laboratory is certified under the [place] (CLIA) as qualified to perform high complexity     clinical laboratory testing.     Performed at [place]     Status: None   Collection Time    09/02/13  2:58 PM      Result Value Range Status   Specimen Description URINE, RANDOM   Final   Special Requests NONE   Final   Culture  Setup Time     Final   Value: 09/02/2013 20:23     Performed at [place] [facility]     Final   Value: NO GROWTH     Performed at [facility]   Culture     Final   Value: NO GROWTH     Performed at [facility]   Report Status 09/03/2013 FINAL   Final  CULTURE, BLOOD (ROUTINE X 2)     Status: None   Collection Time    09/02/13  3:00 PM      Result Value Range Status   Specimen Description BLOOD HAND RIGHT   Final   Special Requests BOTTLES DRAWN AEROBIC AND ANAEROBIC 10CC   Final   Culture  Setup Time     Final  Value: 09/02/2013 20:25     Performed at [facility]   Culture     Final   Value:        BLOOD CULTURE RECEIVED NO GROWTH TO DATE CULTURE WILL BE HELD FOR 5 DAYS BEFORE ISSUING A FINAL NEGATIVE REPORT     Performed at [facility]   Report Status PENDING   Incomplete  CULTURE, BLOOD (ROUTINE X 2)     Status: None   Collection Time    09/02/13   3:25 PM      Result Value Range Status   Specimen Description BLOOD ARM RIGHT   Final   Special Requests BOTTLES DRAWN AEROBIC AND ANAEROBIC 10CC   Final   Culture  Setup Time     Final   Value: 09/02/2013 20:25     Performed at [facility]   Culture     Final   Value:        BLOOD CULTURE RECEIVED NO GROWTH TO DATE CULTURE WILL BE HELD FOR 5 DAYS BEFORE ISSUING A FINAL NEGATIVE REPORT     Performed at [facility]   Report Status PENDING   Incomplete  CULTURE, RESPIRATORY (NON-EXPECTORATED)     Status: None   Collection Time    09/02/13  4:02 PM      Result Value Range Status   Specimen Description TRACHEAL ASPIRATE   Final   Special Requests NONE   Final   Gram Stain     Final   Value: RARE WBC PRESENT, PREDOMINANTLY MONONUCLEAR     NO SQUAMOUS EPITHELIAL CELLS SEEN     ABUNDANT GRAM NEGATIVE RODS     MODERATE YEAST     Performed at [facility]   Culture     Final   Value: ABUNDANT GRAM NEGATIVE RODS     Performed at [facility]   Report Status PENDING   Incomplete    Anti-infectives   Start     Dose/Rate Route Frequency Ordered Stop   09/04/13 1800  acyclovir (ZOVIRAX) 570 mg in dextrose 5 % 100 mL IVPB     10 mg/kg  57 kg (Adjusted) 111.4 mL/hr over 60 Minutes Intravenous 3 times per day 09/04/13 1514     09/03/13 1600  vancomycin (VANCOCIN) 500 mg in sodium chloride 0.9 % 100 mL IVPB     500 mg 100 mL/hr over 60 Minutes Intravenous Every 12 hours 09/03/13 1502     09/03/13 1100  acyclovir (ZOVIRAX) 570 mg in dextrose 5 % 100 mL IVPB  Status:  Discontinued     10 mg/kg  57 kg (Adjusted) 111.4 mL/hr over 60 Minutes Intravenous Every 12 hours 09/03/13 1012 09/04/13 1514   09/02/13 2100  piperacillin-tazobactam (ZOSYN) IVPB 3.375 g     3.375 g 12.5 mL/hr over 240 Minutes Intravenous 3 times per day 09/02/13 1448     09/02/13 1300  vancomycin (VANCOCIN) IVPB 1000 mg/200 mL premix  Status:  Discontinued     1,000 mg 200 mL/hr over 60  Minutes Intravenous Every 24 hours 09/02/13 1300 09/03/13 1502   09/02/13 1215  piperacillin-tazobactam (ZOSYN) IVPB 3.375 g     3.375 g 100 mL/hr over 30 Minutes Intravenous  Once 09/02/13 1211 09/02/13 1529      Assessment:  53 y.o. female presents with SOB. She was just d/c on 08/26/13 after being treated for PNA and influenza (just finished course of Augmentin/Doxycycline on 1/6). Pharmacy was consulted to dose vancomycin and  Zosyn for PNA and acyclovir for suspected varicella-zoster. Renal function has improved since 1/10. CrCl~55 mL/min.   1/9 Vanc>>  1/9 Zosyn>>  1/10 Acyclovir >>   1/9 BCx >> ngtd  1/9 TA >> gram neg rods   Goal of Therapy:  Eradication of infection   Plan:  - Change acyclovir $RemoveBeforeDE'570mg'HTgRiMqGiyhWTad$  IV to q8h ($Remov'10mg'yaXhmI$ /kg based on ABW) - Monitor renal function, Tmax, and clinical progression  Saren Corkern A. Pincus Badder, PharmD Clinical Pharmacist - Resident Pager: [phone] Pharmacy: [phone] 09/04/2013 3:26 PM

## 2013-09-04 NOTE — Consult Note (Addendum)
WOC wound consult note Reason for Consult: Consult requested to assess buttocks for possible shingles.  Culture results pending. Pt is intubated and unable to verbalize at this time.  Acyclovir has been ordered by primary team. This is NOT a pressure ulcer. Wound type: Patchy area of partial thickness skin loss and vesicles in a pattern with appearance consistent with possible shingles to buttocks.  This may eventually spread towards rectum. Area approx 4X2cm. Dressing procedure/placement/frequency: Topical treatment will not be effective and systemic therapy has already been ordered.  Barrier cream can be applied to promote drying of the area, which should be left open to air.  She is on a Sport low air loss bed which increases air circulation to affected area. Please re-consult if further assistance is needed.  Thank-you,  Julien Girt MSN, [place], [place], [place], [place]

## 2013-09-05 ENCOUNTER — Inpatient Hospital Stay (HOSPITAL_COMMUNITY): Payer: Medicare Other

## 2013-09-05 LAB — BASIC METABOLIC PANEL
BUN: 29 mg/dL — ABNORMAL HIGH (ref 6–23)
CALCIUM: 8.3 mg/dL — AB (ref 8.4–10.5)
CO2: 39 mEq/L — ABNORMAL HIGH (ref 19–32)
Chloride: 97 mEq/L (ref 96–112)
Creatinine, Ser: 1.13 mg/dL — ABNORMAL HIGH (ref 0.50–1.10)
GFR calc Af Amer: 65 mL/min — ABNORMAL LOW (ref >90)
GFR, EST NON AFRICAN AMERICAN: 56 mL/min — AB (ref >90)
GLUCOSE: 77 mg/dL (ref 70–99)
Potassium: 3.4 mEq/L — ABNORMAL LOW (ref 3.7–5.3)
Sodium: 148 mEq/L — ABNORMAL HIGH (ref 137–147)

## 2013-09-05 LAB — CBC
HCT: 32.7 % — ABNORMAL LOW (ref 36.0–46.0)
Hemoglobin: 10.8 g/dL — ABNORMAL LOW (ref 12.0–15.0)
MCH: 31.6 pg (ref 26.0–34.0)
MCHC: 33 g/dL (ref 30.0–36.0)
MCV: 95.6 fL (ref 78.0–100.0)
PLATELETS: 159 10*3/uL (ref 150–400)
RBC: 3.42 MIL/uL — ABNORMAL LOW (ref 3.87–5.11)
RDW: 16.2 % — AB (ref 11.5–15.5)
WBC: 8.3 10*3/uL (ref 4.0–10.5)

## 2013-09-05 LAB — GLUCOSE, CAPILLARY
Glucose-Capillary: 96 mg/dL (ref 70–99)
Glucose-Capillary: 102 mg/dL — ABNORMAL HIGH (ref 70–99)

## 2013-09-05 MED ORDER — BUDESONIDE 0.25 MG/2ML IN SUSP
0.2500 mg | Freq: Four times a day (QID) | RESPIRATORY_TRACT | Status: DC
  Administered 2013-09-05 – 2013-09-09 (×10): 0.25 mg via RESPIRATORY_TRACT
  Filled 2013-09-05 (×20): qty 2

## 2013-09-05 MED ORDER — FENTANYL CITRATE 0.05 MG/ML IJ SOLN
12.5000 ug | INTRAMUSCULAR | Status: DC | PRN
  Administered 2013-09-05: 25 ug via INTRAVENOUS
  Administered 2013-09-05: 13:00:00 via INTRAVENOUS
  Filled 2013-09-05 (×3): qty 2

## 2013-09-05 MED ORDER — ALBUTEROL SULFATE (5 MG/ML) 0.5% IN NEBU
1.0000 mL | INHALATION_SOLUTION | Freq: Four times a day (QID) | RESPIRATORY_TRACT | Status: DC
  Filled 2013-09-05: qty 1
  Filled 2013-09-05 (×4): qty 3

## 2013-09-05 MED ORDER — ALBUTEROL SULFATE (2.5 MG/3ML) 0.083% IN NEBU
5.0000 mg | INHALATION_SOLUTION | RESPIRATORY_TRACT | Status: DC | PRN
  Administered 2013-09-05 – 2013-09-06 (×3): 5 mg via RESPIRATORY_TRACT
  Filled 2013-09-05: qty 6

## 2013-09-05 MED ORDER — BUDESONIDE 0.25 MG/2ML IN SUSP
0.2500 mg | Freq: Four times a day (QID) | RESPIRATORY_TRACT | Status: DC
  Filled 2013-09-05 (×3): qty 2

## 2013-09-05 MED ORDER — IPRATROPIUM BROMIDE 0.02 % IN SOLN
2.5000 mL | Freq: Four times a day (QID) | RESPIRATORY_TRACT | Status: DC
  Administered 2013-09-05 – 2013-09-06 (×3): 0.5 mg via RESPIRATORY_TRACT
  Filled 2013-09-05 (×4): qty 2.5

## 2013-09-05 MED ORDER — PANTOPRAZOLE SODIUM 40 MG IV SOLR
40.0000 mg | INTRAVENOUS | Status: DC
  Administered 2013-09-05: 40 mg via INTRAVENOUS
  Filled 2013-09-05: qty 40

## 2013-09-05 MED ORDER — FENTANYL CITRATE 0.05 MG/ML IJ SOLN
25.0000 ug | INTRAMUSCULAR | Status: DC | PRN
  Administered 2013-09-05 (×2): 50 ug via INTRAVENOUS
  Filled 2013-09-05: qty 2

## 2013-09-05 MED ORDER — BUDESONIDE 0.25 MG/2ML IN SUSP
0.2500 mg | Freq: Four times a day (QID) | RESPIRATORY_TRACT | Status: DC
  Filled 2013-09-05: qty 2

## 2013-09-05 MED ORDER — PANTOPRAZOLE SODIUM 40 MG PO PACK
40.0000 mg | PACK | Freq: Every day | ORAL | Status: DC
Start: 2013-09-05 — End: 2013-09-05
  Filled 2013-09-05: qty 20

## 2013-09-05 MED ORDER — ENOXAPARIN SODIUM 40 MG/0.4ML ~~LOC~~ SOLN
40.0000 mg | SUBCUTANEOUS | Status: DC
  Administered 2013-09-06 – 2013-09-10 (×4): 40 mg via SUBCUTANEOUS
  Filled 2013-09-05 (×6): qty 0.4

## 2013-09-05 MED ORDER — POTASSIUM CHLORIDE 20 MEQ/15ML (10%) PO LIQD
20.0000 meq | ORAL | Status: AC
  Administered 2013-09-05 (×2): 20 meq
  Filled 2013-09-05: qty 15

## 2013-09-05 MED ORDER — BUDESONIDE 0.25 MG/2ML IN SUSP
0.2500 mg | Freq: Four times a day (QID) | RESPIRATORY_TRACT | Status: DC
  Administered 2013-09-05: 0.25 mg via RESPIRATORY_TRACT
  Filled 2013-09-05 (×4): qty 2

## 2013-09-05 NOTE — Progress Notes (Signed)
PULMONARY / CRITICAL CARE MEDICINE  Name: [name] MRN: [id number] DOB: Sep 12, 1960    ADMISSION DATE:  09/02/2013   REFERRING MD :  EDP PRIMARY SERVICE: PCCM  CHIEF COMPLAINT:  Acute respiratory failure  BRIEF PATIENT DESCRIPTION: 53 yo with recent recently discharged after influenza A PNA and respiratory failure readmitted with recurrent pneumonia and acute respiratory failure.   SIGNIFICANT EVENTS / STUDIES:   LINES / TUBES: ETT 1/9 >> 1/12 L IJ CVL 1/9 >>   CULTURES: Resp virus panel 12/28 >> Flu A positive MRSA 1/9 >>> neg Respiratory 1/9 >> abundant GNRs >>  Urine 1/9 >>> neg Blood 1/9 >> NEG Varicella PCR 1/10 >>   ANTIBIOTICS: Vancomycin 1/9 >> 1/12 Zosyn 1/9 >>> Acyclovir 1/10 >>>  SUBJECTIVE:  Tolerates PS 5 cm H2O. Requires FiO2 > 50%. Extubated and looks good but on NRB. + F/C  VITAL SIGNS: Temp:  [98.4 F (36.9 C)-99.5 F (37.5 C)] 99 F (37.2 C) (01/12 1200) Pulse Rate:  [75-106] 80 (01/12 1400) Resp:  [15-25] 15 (01/12 1400) BP: (82-143)/(43-95) 143/83 mmHg (01/12 1400) SpO2:  [92 %-99 %] 95 % (01/12 1627) FiO2 (%):  [60 %-100 %] 100 % (01/12 1627) Weight:  [65.1 kg (143 lb 8.3 oz)] 65.1 kg (143 lb 8.3 oz) (01/12 0338)  HEMODYNAMICS:    VENTILATOR SETTINGS: Vent Mode:  [-] PRVC FiO2 (%):  [60 %-100 %] 100 % Set Rate:  [18 bmp] 18 bmp Vt Set:  [440 mL] 440 mL PEEP:  [5 cmH20] 5 cmH20 Plateau Pressure:  [14 cmH20-16 cmH20] 16 cmH20  INTAKE / OUTPUT: Intake/Output     01/11 0701 - 01/12 0700 01/12 0701 - 01/13 0700   I.V. (mL/kg) 1910 (29.3) 312.8 (4.8)   NG/GT 1155 210   IV Piggyback 1270.6    Total Intake(mL/kg) 4335.6 (66.6) 522.8 (8)   Urine (mL/kg/hr) 6640 (4.2) 1175 (1.8)   Emesis/NG output     Total Output 6640 1175   Net -2304.4 -652.2        Stool Occurrence  1 x    General: RASS -1. + F/C. NAD Neuro: No focal deficits HEENT: WNL Cardiovascular:  RRR, no m/r/g Lungs: Prolonged expiratory phase. No wheezes or  rales Abdomen:  Soft, nontender, bowel sounds + Ext: No edema Skin:  Vesicular rash on erythematous base - lower back  LABS: I have reviewed all of today's lab results. Relevant abnormalities are discussed in the A/P section  CXR: [place]  ASSESSMENT / PLAN:  PULMONARY A: Acute on chronic respiratory failure. Recent influenza pneumonia Suspected GNR superinfection, HCAP. Severe COPD. P:   Extubate Monitor in ICU  Supplemental O2 to maintain SpO2 92-97% Cont BDs   CARDIOVASCULAR A: Sinus tachycardia, resolved P:  Monitor  RENAL A:   AKI, resolved Hypokalemia. P:   Monitor BMET intermittently Correct electrolytes as indicated   GASTROINTESTINAL A:  GERD, chronic PPI use P:   SUP: IV PPI NPO post extubation  HEMATOLOGIC A:   Mild anemia without acute blood loss P:  DVT px: Enoxaparin Monitor CBC  INFECTIOUS A:  GNR PNA Suspected shingles P:   Micro and abx as above  ENDOCRINE A:   Hypothyroidism. Hyperglycemia. P:   Cont SSI Cont L-thyroxine  NEUROLOGIC A:  Acute encephalopathy, resolved P:   Minimize sedation  I have personally obtained history, examined patient, evaluated and interpreted laboratory and imaging results, reviewed medical records, formulated assessment / plan and placed orders.  CRITICAL CARE:  The patient is critically  ill with multiple organ systems failure and requires high complexity decision making for assessment and support, frequent evaluation and titration of therapies, application of advanced monitoring technologies and extensive interpretation of multiple databases. Critical Care Time devoted to patient care services described in this note is 45 minutes.   Merton Border, MD Pulmonary and [place] Pager: [phone]  09/05/2013, 4:56 PM

## 2013-09-05 NOTE — Progress Notes (Signed)
[facility] ADULT ICU REPLACEMENT PROTOCOL FOR AM LAB REPLACEMENT ONLY  The patient does apply for the [facility] Adult ICU Electrolyte Replacment Protocol based on the criteria listed below:   1. Is GFR >/= 40 ml/min? yes  Patient's GFR today is 56 2. Is urine output >/= 0.5 ml/kg/hr for the last 6 hours? yes Patient's UOP is 3.1 ml/kg/hr 3. Is BUN < 60 mg/dL? yes  Patient's BUN today is 29 4. Abnormal electrolyte(s): K 3.4 5. Ordered repletion with: per protocol 6. If a panic level lab has been reported, has the CCM MD in charge been notified? yes.   Physician:  Dr Jimmy Footman  Orvil Feil, Naelle Diegel A 09/05/2013 5:27 AM

## 2013-09-05 NOTE — Progress Notes (Signed)
Telephoned Dr. Graylon Good, the infectious disease MD or determine if patient continues to require Airborne precautions.  Per Dr. Graylon Good, this patient now only requires Contact Precautions.  Per Dr. Graylon Good, orders were discontinued for Airborne precautions and placed on contact precautions.

## 2013-09-05 NOTE — Progress Notes (Signed)
09-05-2013- Resp Care Note- Pt suctioned and extubated to 100% NRB.  Pt tolerated procedure well with having a fair productive cough post extubation.  Pt sats 96% on 100%.  Will monitor pt progress and encourage pt to cough and deep breathe.  s Juletta Berhe rrt, rcp

## 2013-09-06 DIAGNOSIS — Z8659 Personal history of other mental and behavioral disorders: Secondary | ICD-10-CM

## 2013-09-06 DIAGNOSIS — E876 Hypokalemia: Secondary | ICD-10-CM | POA: Insufficient documentation

## 2013-09-06 DIAGNOSIS — R5381 Other malaise: Secondary | ICD-10-CM | POA: Diagnosis present

## 2013-09-06 DIAGNOSIS — B029 Zoster without complications: Secondary | ICD-10-CM | POA: Diagnosis present

## 2013-09-06 DIAGNOSIS — F411 Generalized anxiety disorder: Secondary | ICD-10-CM

## 2013-09-06 DIAGNOSIS — E870 Hyperosmolality and hypernatremia: Secondary | ICD-10-CM | POA: Diagnosis present

## 2013-09-06 LAB — GLUCOSE, CAPILLARY
GLUCOSE-CAPILLARY: 153 mg/dL — AB (ref 70–99)
GLUCOSE-CAPILLARY: 109 mg/dL — AB (ref 70–99)
GLUCOSE-CAPILLARY: 76 mg/dL (ref 70–99)
GLUCOSE-CAPILLARY: 83 mg/dL (ref 70–99)
Glucose-Capillary: 116 mg/dL — ABNORMAL HIGH (ref 70–99)
Glucose-Capillary: 81 mg/dL (ref 70–99)
Glucose-Capillary: 121 mg/dL — ABNORMAL HIGH (ref 70–99)

## 2013-09-06 LAB — URINALYSIS, ROUTINE W REFLEX MICROSCOPIC
Bilirubin Urine: NEGATIVE
Glucose, UA: NEGATIVE mg/dL
HGB URINE DIPSTICK: NEGATIVE
Ketones, ur: 15 mg/dL — AB
Leukocytes, UA: NEGATIVE
NITRITE: NEGATIVE
Protein, ur: 30 mg/dL — AB
SPECIFIC GRAVITY, URINE: 1.016 (ref 1.005–1.030)
Urobilinogen, UA: 1 mg/dL (ref 0.0–1.0)
pH: 8 (ref 5.0–8.0)

## 2013-09-06 LAB — CULTURE, RESPIRATORY

## 2013-09-06 LAB — URINE MICROSCOPIC-ADD ON

## 2013-09-06 LAB — BASIC METABOLIC PANEL
BUN: 22 mg/dL (ref 6–23)
CALCIUM: 8.9 mg/dL (ref 8.4–10.5)
CO2: 37 mEq/L — ABNORMAL HIGH (ref 19–32)
Chloride: 99 mEq/L (ref 96–112)
Creatinine, Ser: 1.04 mg/dL (ref 0.50–1.10)
GFR calc Af Amer: 71 mL/min — ABNORMAL LOW (ref >90)
GFR calc non Af Amer: 62 mL/min — ABNORMAL LOW (ref >90)
GLUCOSE: 76 mg/dL (ref 70–99)
Potassium: 3.3 mEq/L — ABNORMAL LOW (ref 3.7–5.3)
SODIUM: 151 meq/L — AB (ref 137–147)

## 2013-09-06 LAB — CULTURE, RESPIRATORY W GRAM STAIN

## 2013-09-06 MED ORDER — TRAZODONE HCL 150 MG PO TABS
150.0000 mg | ORAL_TABLET | Freq: Every day | ORAL | Status: DC
  Administered 2013-09-07 – 2013-09-09 (×3): 150 mg via ORAL
  Filled 2013-09-06 (×5): qty 1

## 2013-09-06 MED ORDER — ALBUTEROL SULFATE (2.5 MG/3ML) 0.083% IN NEBU
2.5000 mg | INHALATION_SOLUTION | Freq: Four times a day (QID) | RESPIRATORY_TRACT | Status: DC
  Administered 2013-09-06 – 2013-09-07 (×3): 2.5 mg via RESPIRATORY_TRACT
  Filled 2013-09-06 (×3): qty 3

## 2013-09-06 MED ORDER — ALPRAZOLAM 0.25 MG PO TABS
0.2500 mg | ORAL_TABLET | Freq: Three times a day (TID) | ORAL | Status: DC | PRN
  Administered 2013-09-06: 0.25 mg via ORAL
  Filled 2013-09-06 (×2): qty 1

## 2013-09-06 MED ORDER — FENTANYL CITRATE 0.05 MG/ML IJ SOLN
12.5000 ug | INTRAMUSCULAR | Status: DC | PRN
  Administered 2013-09-06: 12.5 ug via INTRAVENOUS
  Administered 2013-09-08 – 2013-09-09 (×2): 25 ug via INTRAVENOUS
  Filled 2013-09-06 (×3): qty 2

## 2013-09-06 MED ORDER — POTASSIUM CL IN DEXTROSE 5% 20 MEQ/L IV SOLN
20.0000 meq | INTRAVENOUS | Status: DC
  Administered 2013-09-06 – 2013-09-08 (×3): 20 meq via INTRAVENOUS
  Filled 2013-09-06 (×8): qty 1000

## 2013-09-06 MED ORDER — DILTIAZEM HCL ER BEADS 300 MG PO CP24
300.0000 mg | ORAL_CAPSULE | Freq: Every day | ORAL | Status: DC
  Administered 2013-09-06 – 2013-09-08 (×3): 300 mg via ORAL
  Filled 2013-09-06 (×3): qty 1

## 2013-09-06 MED ORDER — LEVOFLOXACIN 750 MG PO TABS
750.0000 mg | ORAL_TABLET | Freq: Every day | ORAL | Status: DC
  Administered 2013-09-06 – 2013-09-10 (×5): 750 mg via ORAL
  Filled 2013-09-06 (×5): qty 1

## 2013-09-06 MED ORDER — POTASSIUM CHLORIDE 10 MEQ/50ML IV SOLN
10.0000 meq | INTRAVENOUS | Status: AC
  Administered 2013-09-06 (×4): 10 meq via INTRAVENOUS
  Filled 2013-09-06 (×4): qty 50

## 2013-09-06 MED ORDER — LEVOTHYROXINE SODIUM 150 MCG PO TABS
150.0000 ug | ORAL_TABLET | Freq: Every day | ORAL | Status: DC
  Administered 2013-09-07 – 2013-09-10 (×4): 150 ug via ORAL
  Filled 2013-09-06 (×7): qty 1

## 2013-09-06 MED ORDER — PANTOPRAZOLE SODIUM 40 MG PO TBEC
40.0000 mg | DELAYED_RELEASE_TABLET | Freq: Two times a day (BID) | ORAL | Status: DC
  Administered 2013-09-06 – 2013-09-10 (×8): 40 mg via ORAL
  Filled 2013-09-06 (×7): qty 1

## 2013-09-06 MED ORDER — LEVOFLOXACIN 500 MG PO TABS: 500.0000 mg | ORAL_TABLET | Freq: Every day | ORAL | Status: DC

## 2013-09-06 MED ORDER — DULOXETINE HCL 60 MG PO CPEP
60.0000 mg | ORAL_CAPSULE | Freq: Two times a day (BID) | ORAL | Status: DC
  Administered 2013-09-06 – 2013-09-10 (×8): 60 mg via ORAL
  Filled 2013-09-06 (×10): qty 1

## 2013-09-06 MED ORDER — ALBUTEROL SULFATE (5 MG/ML) 0.5% IN NEBU
2.5000 mg | INHALATION_SOLUTION | Freq: Four times a day (QID) | RESPIRATORY_TRACT | Status: DC
Start: 2013-09-06 — End: 2013-09-06
  Filled 2013-09-06: qty 0.5

## 2013-09-06 MED ORDER — VALACYCLOVIR HCL 500 MG PO TABS
1000.0000 mg | ORAL_TABLET | Freq: Three times a day (TID) | ORAL | Status: AC
  Administered 2013-09-06 – 2013-09-08 (×8): 1000 mg via ORAL
  Filled 2013-09-06 (×9): qty 2

## 2013-09-06 MED ORDER — IPRATROPIUM BROMIDE 0.02 % IN SOLN
2.5000 mL | Freq: Four times a day (QID) | RESPIRATORY_TRACT | Status: DC
  Administered 2013-09-06 – 2013-09-07 (×3): 0.5 mg via RESPIRATORY_TRACT
  Filled 2013-09-06 (×3): qty 2.5

## 2013-09-06 NOTE — Progress Notes (Signed)
eLink Physician-Brief Progress Note Patient Name: [name] DOB: 1961/04/17 MRN: [id number]  Date of Service  09/06/2013   HPI/Events of Note  Pt pulled out her IV, is calm but refusing all medications. At this point no clear indicatioj to replace the PIV and she would likely refuse. Will not intervene, revisit the meds with her in a few hours or in the am  eICU Interventions     Intervention Category Minor Interventions: Other:  Darius Lundberg S. 09/06/2013, 11:49 PM

## 2013-09-06 NOTE — Progress Notes (Signed)
Pt husband voiced concerns about patients confusion. Pt confused about situation. Glucose check- 121. Pt diaphoretic. Dr Alva Garnet notified pt transfer status changed to stepdown.

## 2013-09-06 NOTE — Progress Notes (Signed)
eLink Physician-Brief Progress Note Patient Name: [name] DOB: 1960-10-25 MRN: [id number]  Date of Service  09/06/2013   HPI/Events of Note     eICU Interventions  K replaced   Intervention Category Intermediate Interventions: Electrolyte abnormality - evaluation and management  [name] S. 09/06/2013, 6:47 AM

## 2013-09-06 NOTE — Progress Notes (Signed)
PT Cancellation Note  Patient Details Name: [name] MRN: [id number] DOB: 1960/11/29   Cancelled Treatment:    Reason Eval/Treat Not Completed: Patient not medically ready (pt at rest HR 127 and sats 89% on 6L) RN and RT assessing pt and request deferral at this time. Will attempt next date.    Lanetta Inch Beth 09/06/2013, 12:52 PM Elwyn Reach, [place]

## 2013-09-06 NOTE — Progress Notes (Signed)
Bladder scan pt with 920ml  Noted in bladder. Dr Alva Garnet notified. Foley huddle with 2 RN's and charge nurse. Order for foley.

## 2013-09-06 NOTE — Progress Notes (Signed)
NUTRITION FOLLOW UP  Intervention:   1.  General healthful diet; encourage intake of foods and beverages as able.  RD to follow and assess for nutritional adequacy. Will order supplements as needed.  Nutrition Dx:   Inadequate oral intake related to inability to eat as evidenced by NPO status.   Goal:  Pt to meet >/= 90% of their estimated nutrition needs   Monitor:  TF initiation/tolerance  Weight trends  Labs  Vent status PO intake  Assessment:   53 yo wf just discharged 1-2 from [facility] with hypercarbic resp failure, pna, chronic pain and FTT.  Pt has been extubated and diet advanced to CHO Modified Medium for lunch today.  Pt is drinking orange juice with tolerance at time of visit.  RD monitor for diet tolerance and adequate of intake.   Pt with recent admission during which she was eating well.   Height: Ht Readings from Last 1 Encounters:  09/02/13 5\' 2"  (1.575 m)    Weight Status:   Wt Readings from Last 1 Encounters:  09/05/13 143 lb 8.3 oz (65.1 kg)    Re-estimated needs:  Kcal: 1820-2080 Protein: 84-100g Fluid: ~2.0 L/day  Skin:  Generalized edema  Diet Order: Carb Control   Intake/Output Summary (Last 24 hours) at 09/06/13 1042 Last data filed at 09/06/13 0900  Gross per 24 hour  Intake 1127.77 ml  Output   1535 ml  Net -407.23 ml    Last BM: 1/13   Labs:   Recent Labs Lab 09/02/13 1541  09/04/13 0300 09/05/13 0353 09/06/13 0400  NA 141  < > 144 148* 151*  K 3.8  < > 3.3* 3.4* 3.3*  CL 98  < > 101 97 99  CO2 29  < > 34* 39* 37*  BUN 30*  < > 27* 29* 22  CREATININE 1.37*  < > 1.11* 1.13* 1.04  CALCIUM 8.6  < > 7.9* 8.3* 8.9  MG 1.7  --   --   --   --   PHOS 3.6  --   --   --   --   GLUCOSE 203*  < > 88 77 76  < > = values in this interval not displayed.  CBG (last 3)   Recent Labs  09/04/13 1148 09/05/13 0906 09/05/13 1832  GLUCAP 124* 96 102*    Scheduled Meds: . albuterol  2.5 mg Inhalation QID  . antiseptic  oral rinse  15 mL Mouth Rinse QID  . budesonide  0.25 mg Nebulization QID  . chlorhexidine  15 mL Mouth Rinse BID  . diltiazem  300 mg Oral Daily  . DULoxetine  60 mg Oral BID  . enoxaparin (LOVENOX) injection  40 mg Subcutaneous Q24H  . ipratropium  2.5 mL Inhalation QID  . levofloxacin  750 mg Oral Daily  . [START ON 09/07/2013] levothyroxine  150 mcg Oral QAC breakfast  . pantoprazole  40 mg Oral BID  . potassium chloride  10 mEq Intravenous Q1 Hr x 4  . traZODone  150 mg Oral QHS  . valACYclovir  1,000 mg Oral TID    Continuous Infusions: . dextrose 5 % with KCl 20 mEq / Carlean Jews      Brynda Greathouse, MS RD LDN Clinical Inpatient Dietitian Pager: [phone] Weekend/After hours pager: [phone]

## 2013-09-06 NOTE — Progress Notes (Signed)
PULMONARY / CRITICAL CARE MEDICINE  Name: [name] MRN: [id number] DOB: May 28, 1961    ADMISSION DATE:  09/02/2013   REFERRING MD :  EDP PRIMARY SERVICE: PCCM  CHIEF COMPLAINT:  Acute respiratory failure  BRIEF PATIENT DESCRIPTION: 53 yo with recent recently discharged after influenza A PNA and respiratory failure readmitted with recurrent pneumonia and acute respiratory failure.    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 Intermittently agitated, confused and uncooperative  VITAL SIGNS: Temp:  [97.2 F (36.2 C)-99.1 F (37.3 C)] 97.6 F (36.4 C) (01/13 1200) Pulse Rate:  [84-118] 118 (01/13 1400) Resp:  [11-25] 25 (01/13 1400) BP: (130-154)/(41-112) 134/101 mmHg (01/13 1400) SpO2:  [88 %-97 %] 92 % (01/13 1400)  HEMODYNAMICS:    VENTILATOR SETTINGS:    INTAKE / OUTPUT: Intake/Output     01/12 0701 - 01/13 0700 01/13 0701 - 01/14 0700   I.V. (mL/kg) 612.8 (9.4) 116.7 (1.8)   NG/GT 210    IV Piggyback 595.6 200   Total Intake(mL/kg) 1418.4 (21.8) 316.7 (4.9)   Urine (mL/kg/hr) 2160 (1.4)    Total Output 2160     Net -741.6 +316.7        Stool Occurrence 1 x     General: RASS 0. + F/C. NAD Neuro: No focal deficits, poorly oriented HEENT: WNL Cardiovascular: tachy, regular, no m/r/g Lungs: diminished anteriorly, no wheezes noted Abdomen:  Soft, nontender, bowel sounds + Ext: No edema Skin: Varicelliform rach over coccyx   LABS: I have reviewed all of today's lab results. 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 Complete 10 days (through 1/19)     Hypernatremia   Hypokalemia  Correct electrolyte - D5W + KCl ordered 1/13  F/U BMET 1/14    Suspected shingles  Changed to valacyclovir 1/13  Complete 7 days antivirals (through 1/17)    Physical deconditioning  PT ordered 1/13    History of anxiety - chronic alprazolam  Resume alpazolam @ home doses    History of depression, chronic antidepressants  Resume home antidepressants    TOBACCO ABUSE    GERD, chronic PPI use  Continue PPI   Transfer to SDU. Discussed with TR[facility]. They will assume care 1/14 as primary service. PCCM will see at least once more for Pulmonary F/U  Merton Border, MD ; [facility] [phone].  After 5:30 PM or weekends, call [phone]

## 2013-09-07 ENCOUNTER — Encounter (HOSPITAL_COMMUNITY): Payer: Self-pay | Admitting: *Deleted

## 2013-09-07 DIAGNOSIS — I5032 Chronic diastolic (congestive) heart failure: Secondary | ICD-10-CM | POA: Diagnosis present

## 2013-09-07 DIAGNOSIS — I5022 Chronic systolic (congestive) heart failure: Secondary | ICD-10-CM | POA: Diagnosis present

## 2013-09-07 DIAGNOSIS — R410 Disorientation, unspecified: Secondary | ICD-10-CM | POA: Diagnosis present

## 2013-09-07 LAB — GLUCOSE, CAPILLARY
GLUCOSE-CAPILLARY: 79 mg/dL (ref 70–99)
GLUCOSE-CAPILLARY: 100 mg/dL — AB (ref 70–99)

## 2013-09-07 LAB — URINE CULTURE
Colony Count: NO GROWTH
Culture: NO GROWTH

## 2013-09-07 LAB — VARICELLA-ZOSTER BY PCR: Varicella-Zoster, PCR: NOT DETECTED

## 2013-09-07 MED ORDER — OXYCODONE HCL 10 MG PO TABS: 10.0000 mg | ORAL_TABLET | Freq: Four times a day (QID) | ORAL | Status: DC

## 2013-09-07 MED ORDER — FERROUS SULFATE 325 (65 FE) MG PO TABS
325.0000 mg | ORAL_TABLET | Freq: Every day | ORAL | Status: DC
  Administered 2013-09-07 – 2013-09-10 (×4): 325 mg via ORAL
  Filled 2013-09-07 (×5): qty 1

## 2013-09-07 MED ORDER — PREGABALIN 50 MG PO CAPS: 100.0000 mg | ORAL_CAPSULE | Freq: Three times a day (TID) | ORAL | Status: DC

## 2013-09-07 MED ORDER — PREGABALIN 50 MG PO CAPS
50.0000 mg | ORAL_CAPSULE | Freq: Three times a day (TID) | ORAL | Status: DC
  Administered 2013-09-07 – 2013-09-10 (×9): 50 mg via ORAL
  Filled 2013-09-07 (×9): qty 1

## 2013-09-07 MED ORDER — LINACLOTIDE 145 MCG PO CAPS
145.0000 ug | ORAL_CAPSULE | Freq: Every day | ORAL | Status: DC
  Administered 2013-09-07 – 2013-09-10 (×4): 145 ug via ORAL
  Filled 2013-09-07 (×4): qty 1

## 2013-09-07 MED ORDER — IPRATROPIUM-ALBUTEROL 0.5-2.5 (3) MG/3ML IN SOLN
3.0000 mL | Freq: Four times a day (QID) | RESPIRATORY_TRACT | Status: DC
  Administered 2013-09-07 – 2013-09-09 (×10): 3 mL via RESPIRATORY_TRACT
  Filled 2013-09-07 (×8): qty 3
  Filled 2013-09-07: qty 30
  Filled 2013-09-07: qty 3

## 2013-09-07 MED ORDER — FERROUS SULFATE 325 (65 FE) MG PO TBEC: 325.0000 mg | DELAYED_RELEASE_TABLET | Freq: Every day | ORAL | Status: DC

## 2013-09-07 MED ORDER — ALPRAZOLAM 0.5 MG PO TABS
0.5000 mg | ORAL_TABLET | Freq: Two times a day (BID) | ORAL | Status: DC
  Administered 2013-09-07 – 2013-09-10 (×7): 0.5 mg via ORAL
  Filled 2013-09-07 (×7): qty 1

## 2013-09-07 MED ORDER — OXYCODONE HCL 5 MG PO TABS
10.0000 mg | ORAL_TABLET | Freq: Four times a day (QID) | ORAL | Status: DC
  Administered 2013-09-08 (×2): 10 mg via ORAL
  Filled 2013-09-07 (×3): qty 2

## 2013-09-07 NOTE — Evaluation (Signed)
Physical Therapy Evaluation Patient Details Name: [name] MRN: [id number] DOB: [birth year] Today's Date: 09/07/2013 Time: 2641-5830 PT Time Calculation (min): 18 min  PT Assessment / Plan / Recommendation History of Present Illness  Pt admit with respiratory failure and PNA.  Delirium as well.    Clinical Impression  Pt admitted with above. Pt currently with functional limitations due to the deficits listed below (see PT Problem List). Will most likely need NHP. Pt will benefit from skilled PT to increase their independence and safety with mobility to allow discharge to the venue listed below.     PT Assessment  Patient needs continued PT services    Follow Up Recommendations  Supervision/Assistance - 24 hour;SNF                Equipment Recommendations  Other (comment) (TBA)         Frequency Min 3X/week    Precautions / Restrictions Precautions Precautions: Fall Restrictions Weight Bearing Restrictions: No   Pertinent Vitals/Pain O2 sats on 4L decr to 85% with UE and LE exercise only.  No pain.      Mobility  Bed Mobility Overal bed mobility: +2 for physical assistance;Needs Assistance Bed Mobility: Rolling Rolling: Min assist;+2 for physical assistance General bed mobility comments: Refused to get OOB    Exercises General Exercises - Lower Extremity Ankle Circles/Pumps: AAROM;Both;10 reps;Supine Quad Sets: AAROM;Both;10 reps;Supine Heel Slides: AAROM;Both;10 reps;Supine Hip ABduction/ADduction: AAROM;Both;10 reps;Supine   PT Diagnosis: Generalized weakness  PT Problem List: Decreased activity tolerance;Decreased balance;Decreased mobility;Decreased knowledge of use of DME;Decreased safety awareness;Decreased knowledge of precautions PT Treatment Interventions: DME instruction;Gait training;Functional mobility training;Therapeutic activities;Therapeutic exercise;Balance training;Patient/family education     PT Goals(Current goals can be found in the  care plan section) Acute Rehab PT Goals Patient Stated Goal: to go home PT Goal Formulation: With patient Time For Goal Achievement: 09/14/13 Potential to Achieve Goals: Good  Visit Information  Last PT Received On: 09/07/13 Assistance Needed: +2 (for mobility) History of Present Illness: Pt admit with respiratory failure and PNA.  Delirium as well.         Prior [place] expects to be discharged to:: Private residence Living Arrangements: Spouse/significant other Available Help at Discharge: Family;Available 24 hours/day Type of Home: House Home Access: Stairs to enter [employer] of Steps: 4 Entrance Stairs-Rails: None Home Layout: One level;Multi-level Alternate Level Stairs-Number of Steps: 7 Home Equipment: Cane - single point;Bedside commode;Shower seat Prior Function Level of Independence: Independent with assistive device(s) Communication Communication: No difficulties    Cognition  Cognition Arousal/Alertness: Awake/alert Behavior During Therapy: WFL for tasks assessed/performed Overall Cognitive Status: History of cognitive impairments - at baseline    Extremity/Trunk Assessment Upper Extremity Assessment Upper Extremity Assessment: Defer to OT evaluation Lower Extremity Assessment Lower Extremity Assessment: Generalized weakness   Balance    End of Session PT - End of Session Equipment Utilized During Treatment: Gait belt Activity Tolerance: Patient limited by fatigue Patient left: in bed;with call bell/phone within reach;with bed alarm set Nurse Communication: Mobility status       INGOLD,Jinan Biggins 09/07/2013, 12:43 PM [facility] [phone] (pager)

## 2013-09-07 NOTE — Progress Notes (Signed)
Pt has pulled out her IV and is refusing a restart. She also refuses to take any of her medicines. Several nurses have tried futilely to get to take medicine. MD and Family informed about situation. Pt is delirious but calm in bed. Will continue to monitor

## 2013-09-07 NOTE — Progress Notes (Deleted)
Pt has pulled her iv out and has refused to take any of her meds. She also refuses nursing care. Family and MD informed about situation. Pt is confused but calm. Will continue to monitor

## 2013-09-07 NOTE — Progress Notes (Signed)
Patient refused finger stick for cbg monitoring.

## 2013-09-07 NOTE — Progress Notes (Addendum)
[facility] Progress Note    [name] BHA:[id number] DOB: Mar 04, 1961 DOA: 09/02/2013 PCP: [name], NP  Brief narrative: 53 year old female patient with multiple medical problems. Just discharged from [name] on 08/26/2013 after being treated for hypercarbic respiratory failure, pneumonia, sequelae from chronic pain and failure to thrive. During the last hospitalization the patient was diagnosed with influenza AH 3 and secondary pneumonia and was treated with Tamiflu and antibiotics. She is chronically on 2-4 L of nasal cannula oxygen at home. Prior to that admission she had continued to smoke. During that admission patient required intubation and mechanical ventilation.  She returned to [facility] on 09/02/2013 with acute respiratory failure. Patient's husband called EMS to the home after several days of progressive shortness of breath and hypoxia. She was hypoxic upon evaluation by EMS. She was started on BiPAP with improvement in her oxygenation. Chest x-ray performed in the ER showed possible interstitial pneumonia and she was started on empiric broad-spectrum antibiotics. While in the ER her respiratory status worsened ABG demonstrated progressive hypercarbia. She was evaluated by critical care medicine and admitted to the ICU where she was later intubated.  Since admission patient's respiratory cultures were positive for an atypical organism (Burkholderia cepacia) but other cultures were negative. She also developed an atypical rash on her coccygeal/sacral area which was suspected obese shingles and she was started on empiric treatment for this. Cause of altered mentation her usual home medications including her benzodiazepines were held. She stabilized and was extubated on 09/05/2013. She was deemed appropriate to transfer out to the general medical floor on 09/06/2013.   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), post influenza -Still requiring higher than home-based levels of oxygen but improving -Nonproductive cough -Continue supportive care and pulmonary toileting including mobilization -Continue antibiotics    Acute delirium -Suspect related to withdrawal of benzodiazepines and narcotics -Have resumed usual meds -Less paranoid and more appropriate with husband in room    Chronic systolic congestive heart failure/Diastolic heart failure NYHA class 1 -Her outpatient cardiologist had planned for stress test but due to recurrent pulmonary illnesses this has been placed on hold -Last echo was completed in 2013-will obtain during this admission -Currently appears compensated but is requiring volume resuscitation for dehydration and hypernatremia so watch closely noting EF only mildly reduced in the 40-45% range    Hypernatremia -Seems related to volume depletion -Continue dextrose IV fluid -Follow labs    Shingles -Continue antiviral medication    Physical deconditioning -PT/OT evaluation -Suspect will need either CIR or skilled nursing facility at time of discharge noting patient prefers the former      History of anxiety - chronic alprazolam -Resuming usual medications-husband clarified she is on twice a day and not when necessary dosing    TOBACCO ABUSE -Patient states has not resumed smoking since last admission    GERD, chronic PPI use    FIBROMYALGIA -Home meds are renewed -Will decrease Lyrica from 100 to 50 mg 3 times a day -Husband clarified patient on 10 mg Roxicodone every 6 hours scheduled-will try this dose with hold parameters if sedated but may need to decrease dose -Expect since patient has not slept well over several days that once her symptoms are controlled including her withdrawal symptoms she may sleep deeply for a period of time    History of depression,  chronic antidepressants -Resume  usual home medications   DVT prophylaxis: Lovenox Code Status:  Full Family Communication: Spoke extensively with patient's husband at bedside for greater than 30 minutes after initial rounding completed this was done between the time of around 1:45 to 2:15 PM Disposition Plan/Expected LOS: Transfer to floor   Consultants: PCCM  Procedures: ETT 1/9 >> 1/12  L IJ CVL 1/9 >> 1/13  Echocardiogram pending  CULTURES:  Resp virus panel 12/28 >> Flu A positive  MRSA 1/9 >>> neg  Respiratory 1/9 >> Burkholderia cepacia  Urine 1/9 >>> neg  Blood 1/9 >> NEG  Varicella PCR 1/10 >>   Antibiotics: Vancomycin 1/9 >> 1/12  Zosyn 1/9 >> 1/13  Acyclovir 1/10 >> 1/13  Levofloxacin 1/13 >>  Valacyclovir 1/13 >>   HPI/Subjective: Patient awake but very confused earlier in the day. Seemed more appropriate after husband arrived in reoriented her in assisted her in taking her medications. Still having shortness of breath and generalized fatigue  Objective: Blood pressure 133/87, pulse 112, temperature 98.6 F (37 C), temperature source Oral, resp. rate 18, height 5\' 2"  (1.575 m), weight 143 lb 8.3 oz (65.1 kg), SpO2 92.00%.  Intake/Output Summary (Last 24 hours) at 09/07/13 1504 Last data filed at 09/07/13 1100  Gross per 24 hour  Intake    870 ml  Output   1400 ml  Net   -530 ml     Exam: General: No acute respiratory distress Lungs: Clear to auscultation bilaterally without wheezes or crackles, RA Cardiovascular: Regular rate and rhythm without murmur gallop or rub normal S1 and S2, no peripheral edema or JVD Abdomen: Nontender, nondistended, soft, bowel sounds positive, no rebound, no ascites, no appreciable mass Musculoskeletal: No significant cyanosis, clubbing of bilateral lower extremities Neurological: Alert and oriented x 3, moves all extremities x 4 without focal neurological deficits, CN 2-12 intact  Scheduled Meds:  Scheduled Meds: .  ALPRAZolam  0.5 mg Oral BID  . antiseptic oral rinse  15 mL Mouth Rinse QID  . budesonide  0.25 mg Nebulization QID  . chlorhexidine  15 mL Mouth Rinse BID  . diltiazem  300 mg Oral Daily  . DULoxetine  60 mg Oral BID  . enoxaparin (LOVENOX) injection  40 mg Subcutaneous Q24H  . ferrous sulfate  325 mg Oral Q breakfast  . ipratropium-albuterol  3 mL Nebulization QID  . levofloxacin  750 mg Oral Daily  . levothyroxine  150 mcg Oral QAC breakfast  . Linaclotide  145 mcg Oral Daily  . oxyCODONE  10 mg Oral Q6H  . pantoprazole  40 mg Oral BID  . pregabalin  50 mg Oral TID  . traZODone  150 mg Oral QHS  . valACYclovir  1,000 mg Oral TID   Continuous Infusions: . dextrose 5 % with KCl 20 mEq / L 20 mEq (09/06/13 1114)    **Reviewed in detail by the Attending Physician  Data Reviewed: Basic Metabolic Panel:  Recent Labs Lab 09/02/13 1541 09/03/13 0937 09/04/13 0300 09/05/13 0353 09/06/13 0400  NA 141 143 144 148* 151*  K 3.8 3.5* 3.3* 3.4* 3.3*  CL 98 99 101 97 99  CO2 29 33* 34* 39* 37*  GLUCOSE 203* 103* 88 77 76  BUN 30* 29* 27* 29* 22  CREATININE 1.37* 1.21* 1.11* 1.13* 1.04  CALCIUM 8.6 8.0* 7.9* 8.3* 8.9  MG 1.7  --   --   --   --   PHOS 3.6  --   --   --   --  Liver Function Tests:  Recent Labs Lab 09/02/13 1541  AST 46*  ALT 51*  ALKPHOS 104  BILITOT 0.6  PROT 5.9*  ALBUMIN 2.4*   No results found for this basename: LIPASE, AMYLASE,  in the last 168 hours No results found for this basename: AMMONIA,  in the last 168 hours CBC:  Recent Labs Lab 09/02/13 1145 09/02/13 1159 09/03/13 0937 09/04/13 0300 09/05/13 0353  WBC 9.6  --  11.5* 9.5 8.3  NEUTROABS 7.9*  --   --   --   --   HGB 12.8 13.6 11.0* 10.2* 10.8*  HCT 38.7 40.0 33.1* 30.7* 32.7*  MCV 96.8  --  94.3 94.8 95.6  PLT 185  --  164 161 159   Cardiac Enzymes:  Recent Labs Lab 09/02/13 1530  TROPONINI <0.30   BNP (last 3 results)  Recent Labs  08/20/13 1926 09/02/13 1145    PROBNP 736.3* 622.9*   CBG:  Recent Labs Lab 09/05/13 1832 09/06/13 0020 09/06/13 1356 09/06/13 2203 09/07/13 0809  GLUCAP 102* 81 121* 83 79    Recent Results (from the past 240 hour(s))  URINE CULTURE     Status: None   Collection Time    09/02/13  2:58 PM      Result Value Range Status   Specimen Description URINE, RANDOM   Final   Special Requests NONE   Final   Culture  Setup Time     Final   Value: 09/02/2013 20:23     Performed at [facility]     Final   Value: NO GROWTH     Performed at [facility]   Culture     Final   Value: NO GROWTH     Performed at [facility]   Report Status 09/03/2013 FINAL   Final  CULTURE, BLOOD (ROUTINE X 2)     Status: None   Collection Time    09/02/13  3:00 PM      Result Value Range Status   Specimen Description BLOOD HAND RIGHT   Final   Special Requests BOTTLES DRAWN AEROBIC AND ANAEROBIC 10CC   Final   Culture  Setup Time     Final   Value: 09/02/2013 20:25     Performed at [facility]   Culture     Final   Value:        BLOOD CULTURE RECEIVED NO GROWTH TO DATE CULTURE WILL BE HELD FOR 5 DAYS BEFORE ISSUING A FINAL NEGATIVE REPORT     Performed at [facility]   Report Status PENDING   Incomplete  CULTURE, BLOOD (ROUTINE X 2)     Status: None   Collection Time    09/02/13  3:25 PM      Result Value Range Status   Specimen Description BLOOD ARM RIGHT   Final   Special Requests BOTTLES DRAWN AEROBIC AND ANAEROBIC 10CC   Final   Culture  Setup Time     Final   Value: 09/02/2013 20:25     Performed at [facility]   Culture     Final   Value:        BLOOD CULTURE RECEIVED NO GROWTH TO DATE CULTURE WILL BE HELD FOR 5 DAYS BEFORE ISSUING A FINAL NEGATIVE REPORT     Performed at [facility]   Report Status PENDING   Incomplete  CULTURE, RESPIRATORY (NON-EXPECTORATED)     Status: None  Collection Time    09/02/13  4:02 PM      Result Value Range  Status   Specimen Description TRACHEAL ASPIRATE   Final   Special Requests NONE   Final   Gram Stain     Final   Value: RARE WBC PRESENT, PREDOMINANTLY MONONUCLEAR     NO SQUAMOUS EPITHELIAL CELLS SEEN     ABUNDANT GRAM NEGATIVE RODS     MODERATE YEAST     Performed at [facility]   Culture     Final   Value: ABUNDANT BURKHOLDERIA CEPACIA     Performed at [facility]   Report Status 09/06/2013 FINAL   Final   Organism ID, Bacteria BURKHOLDERIA CEPACIA   Final     Studies:  Recent x-ray studies have been reviewed in detail by the Attending Physician  Time spent : 35 minutes     Erin Hearing, ANP [facility] Office  [phone] Pager [phone]  **If unable to reach the above provider after paging please contact the [place] @ [phone]  On-Call/Text Page:      [email or web address]      password [email or web address]  If 7PM-7AM, please contact night-coverage [email or web address] Password [email or web address] 09/07/2013, 3:04 PM   LOS: 5 days   I have personally examined this patient and reviewed the entire database. I have reviewed the above note, made any necessary editorial changes, and agree with its content.  Cherene Altes, MD [facility]

## 2013-09-08 DIAGNOSIS — R404 Transient alteration of awareness: Secondary | ICD-10-CM

## 2013-09-08 LAB — BASIC METABOLIC PANEL
BUN: 21 mg/dL (ref 6–23)
CALCIUM: 9.1 mg/dL (ref 8.4–10.5)
CO2: 24 mEq/L (ref 19–32)
Chloride: 99 mEq/L (ref 96–112)
Creatinine, Ser: 1.31 mg/dL — ABNORMAL HIGH (ref 0.50–1.10)
GFR calc non Af Amer: 47 mL/min — ABNORMAL LOW (ref >90)
GFR, EST AFRICAN AMERICAN: 54 mL/min — AB (ref >90)
GLUCOSE: 123 mg/dL — AB (ref 70–99)
POTASSIUM: 4 meq/L (ref 3.7–5.3)
Sodium: 141 mEq/L (ref 137–147)

## 2013-09-08 LAB — GLUCOSE, CAPILLARY
GLUCOSE-CAPILLARY: 104 mg/dL — AB (ref 70–99)
Glucose-Capillary: 128 mg/dL — ABNORMAL HIGH (ref 70–99)

## 2013-09-08 LAB — CULTURE, BLOOD (ROUTINE X 2)
CULTURE: NO GROWTH
CULTURE: NO GROWTH

## 2013-09-08 LAB — TSH: TSH: 3.584 u[IU]/mL (ref 0.350–4.500)

## 2013-09-08 MED ORDER — BIOTENE DRY MOUTH MT LIQD
15.0000 mL | Freq: Two times a day (BID) | OROMUCOSAL | Status: DC
  Administered 2013-09-08 – 2013-09-10 (×4): 15 mL via OROMUCOSAL

## 2013-09-08 MED ORDER — DILTIAZEM HCL ER BEADS 240 MG PO CP24
240.0000 mg | ORAL_CAPSULE | Freq: Every day | ORAL | Status: DC
  Filled 2013-09-08: qty 1

## 2013-09-08 MED ORDER — SODIUM CHLORIDE 0.9 % IV SOLN
INTRAVENOUS | Status: DC
  Administered 2013-09-08 – 2013-09-09 (×2): via INTRAVENOUS
  Administered 2013-09-09: 100 mL/h via INTRAVENOUS
  Administered 2013-09-09 – 2013-09-10 (×2): via INTRAVENOUS

## 2013-09-08 MED ORDER — OXYCODONE HCL 5 MG PO TABS
5.0000 mg | ORAL_TABLET | Freq: Four times a day (QID) | ORAL | Status: DC
  Administered 2013-09-09 – 2013-09-10 (×6): 5 mg via ORAL
  Filled 2013-09-08 (×6): qty 1

## 2013-09-08 NOTE — Progress Notes (Signed)
Physical Therapy Treatment Patient Details Name: [name] MRN: [id number] DOB: [birth year] Today's Date: 09/08/2013 Time: 9483-4758 PT Time Calculation (min): 29 min  PT Assessment / Plan / Recommendation  History of Present Illness Pt admit with respiratory failure and PNA.  Delirium as well.     PT Comments   Pt with excellent progression today without AMS today. Pt with HR 130 at rest in bed and RN aware and stated to proceed with mobility as resting HR has been 120. Pt asymptomatic throughout but limited mobility due to tachycardia. Pt very motivated and eager to walk in the hall and encouraged to mobilize with nursing. Pt also encouraged to continue HEP. Pt states she has RW, BSC and shower seat at home.   Follow Up Recommendations  Home health PT;Supervision - Intermittent     Does the patient have the potential to tolerate intense rehabilitation     Barriers to Discharge        Equipment Recommendations       Recommendations for Other Services    Frequency     Progress towards PT Goals Progress towards PT goals: Goals met and updated - see care plan  Plan Discharge plan needs to be updated    Precautions / Restrictions Precautions Precautions: Fall Precaution Comments: watch HR Restrictions Weight Bearing Restrictions: No   Pertinent Vitals/Pain No pain HR 130-152 sats 94% on 3L   Mobility  Bed Mobility Rolling: Modified independent (Device/Increase time) General bed mobility comments: HOB 30degrees with use of rail Transfers Overall transfer level: Needs assistance Transfers: Sit to/from Stand Sit to Stand: Supervision General transfer comment: cueing for hand placement Ambulation/Gait Ambulation/Gait assistance: Supervision Ambulation Distance (Feet): 25 Feet Assistive device: Rolling walker (2 wheeled) Gait Pattern/deviations: Step-through pattern;Decreased stride length General Gait Details: distance and speed limited by elevated HR up to 152     Exercises General Exercises - Lower Extremity Long Arc Quad: AROM;Seated;Both;20 reps Hip ABduction/ADduction: AROM;Seated;Both;20 reps Straight Leg Raises: AROM;Seated;Both;20 reps Hip Flexion/Marching: AROM;Seated;Both;20 reps Toe Raises: AROM;Seated;Both;20 reps Heel Raises: AROM;Seated;Both;20 reps   PT Diagnosis:    PT Problem List:   PT Treatment Interventions:     PT Goals (current goals can now be found in the care plan section)    Visit Information  Last PT Received On: 09/08/13 Assistance Needed: +1 History of Present Illness: Pt admit with respiratory failure and PNA.  Delirium as well.      Subjective Data      Cognition  Cognition Arousal/Alertness: Awake/alert Behavior During Therapy: WFL for tasks assessed/performed Overall Cognitive Status: Within Functional Limits for tasks assessed    Balance     End of Session PT - End of Session Equipment Utilized During Treatment: Gait belt;Oxygen Activity Tolerance: Other (comment) (limited by tachycardia) Patient left: in chair;with call bell/phone within reach Nurse Communication: Mobility status   GP     [facility] 09/08/2013, 10:47 AM Elwyn Reach, [place]

## 2013-09-08 NOTE — Progress Notes (Signed)
Dr. Wynelle Cleveland notified of pt bp 88/59 with mean 62.  Pt is asymptomatic at this blood pressure.  Orders received to change medication doses.  Dr. Wynelle Cleveland approved transfer to [place].

## 2013-09-08 NOTE — Progress Notes (Addendum)
[facility] Progress Note    [name] [id number] DOB: 01-26-1961 DOA: 09/02/2013 PCP: [name], NP  Brief narrative: 53 year old female patient with multiple medical problems. Just discharged from [facility] on 08/26/2013 after being treated for hypercarbic respiratory failure, pneumonia, sequelae from chronic pain and failure to thrive. During the last hospitalization the patient was diagnosed with influenza AH 3 and secondary pneumonia and was treated with Tamiflu and antibiotics. She is chronically on 2-4 L of nasal cannula oxygen at home. Prior to that admission she had continued to smoke. During that admission patient required intubation and mechanical ventilation.  She returned to [facility] on 09/02/2013 with acute respiratory failure. Patient's husband called EMS to the home after several days of progressive shortness of breath and hypoxia. She was hypoxic upon evaluation by EMS. She was started on BiPAP with improvement in her oxygenation. Chest x-ray performed in the ER showed possible interstitial pneumonia and she was started on empiric broad-spectrum antibiotics. While in the ER her respiratory status worsened ABG demonstrated progressive hypercarbia. She was evaluated by critical care medicine and admitted to the ICU where she was later intubated.  Since admission patient's respiratory cultures were positive for an atypical organism (Burkholderia cepacia) but other cultures were negative. She also developed an atypical rash on her coccygeal/sacral area which was suspected obese shingles and she was started on empiric treatment for this. Cause of altered mentation her usual home medications including her benzodiazepines were held. She stabilized and was extubated on 09/05/2013. She was deemed appropriate to transfer out to the general medical floor on 09/06/2013.   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), post influenza -Stable on home level of oxygen 3 L per minute -Nonproductive cough -Continue supportive care and pulmonary toileting including mobilization -Continue antibiotics    Acute delirium -Resolved -Suspected was related to withdrawal of benzodiazepines and narcotics -Resumed usual meds 1/14    Chronic systolic congestive heart failure/Diastolic heart failure NYHA class 1 -Her outpatient cardiologist had planned for stress test but due to recurrent pulmonary illnesses this has been placed on hold -Last echo was completed in 2013-echo repeated this admission with mild LVH grade 1 diastolic dysfunction and hyperdynamic EF noting no regional wall motion abnormalities -Currently appears compensated but is requiring volume resuscitation for dehydration and hypernatremia so watch closely noting EF only mildly reduced in the 40-45% range    Hypernatremia/DH -Seems related to volume depletion -Sodium has normalized and patient eating so will change IV fluid to normal saline at current rate since remains tachycardic -Follow labs    Shingles -Continue antiviral medication    Physical deconditioning/tachycardia -PT/OT evaluation -Suspect will need either CIR or skilled nursing facility at time of discharge noting patient prefers the former-PT now recommending home health PT with intermittent supervision-we'll continue to follow-may need to discuss with husband in advance CIR more appropriate setting -Suspect physical deconditioning contributing to recurrent tachycardia      History of anxiety - chronic alprazolam -Resuming usual medications-husband clarified she is on twice a day and not when necessary dosing    TOBACCO ABUSE -Patient proudly states has not resumed smoking since last admission (20 days0    GERD, chronic PPI use    FIBROMYALGIA -Home meds renewed  1/14 -Decreased Lyrica from 100 to 50 mg 3 times a day -Husband clarified patient on 10 mg Roxicodone every  6 hours scheduled-will try this dose with hold parameters if sedated but may need to decrease dose    History of depression, chronic antidepressants -Resumed usual home medications   DVT prophylaxis: Lovenox Code Status:  Full Family Communication: Family at bedside Disposition Plan/Expected LOS: Transfer to floor   Consultants: PCCM  Procedures: ETT 1/9 >> 1/12  L IJ CVL 1/9 >> 1/13  Echocardiogram pending  CULTURES:  Resp virus panel 12/28 >> Flu A positive  MRSA 1/9 >>> neg  Respiratory 1/9 >> Burkholderia cepacia  Urine 1/9 >>> neg  Blood 1/9 >> NEG  Varicella PCR 1/10 >>   Antibiotics: Vancomycin 1/9 >> 1/12  Zosyn 1/9 >> 1/13  Acyclovir 1/10 >> 1/13  Levofloxacin 1/13 >>  Valacyclovir 1/13 >>   HPI/Subjective: Patient awake but very confused earlier in the day. Seemed more appropriate after husband arrived in reoriented her in assisted her in taking her medications. Still having shortness of breath and generalized fatigue  Objective: Blood pressure 105/69, pulse 130, temperature 98.5 F (36.9 C), temperature source Oral, resp. rate 20, height 5\' 2"  (1.575 m), weight 143 lb 8.3 oz (65.1 kg), SpO2 94.00%.  Intake/Output Summary (Last 24 hours) at 09/08/13 1505 Last data filed at 09/08/13 1400  Gross per 24 hour  Intake   2640 ml  Output    585 ml  Net   2055 ml     Exam: General: No acute respiratory distress Lungs: Clear to auscultation bilaterally without wheezes or crackles,  3 L Cardiovascular: Regular tachycardic rate and rhythm without murmur gallop or rub normal S1 and S2, no peripheral edema or JVD Abdomen: Nontender, nondistended, soft, bowel sounds positive, no rebound, no ascites, no appreciable mass Musculoskeletal: No significant cyanosis, clubbing of bilateral lower extremities Neurological: Alert and oriented x 3, moves all  extremities x 4 without focal neurological deficits, CN 2-12 intact  Scheduled Meds:  Scheduled Meds: . ALPRAZolam  0.5 mg Oral BID  . antiseptic oral rinse  15 mL Mouth Rinse BID  . budesonide  0.25 mg Nebulization QID  . diltiazem  300 mg Oral Daily  . DULoxetine  60 mg Oral BID  . enoxaparin (LOVENOX) injection  40 mg Subcutaneous Q24H  . ferrous sulfate  325 mg Oral Q breakfast  . ipratropium-albuterol  3 mL Nebulization QID  . levofloxacin  750 mg Oral Daily  . levothyroxine  150 mcg Oral QAC breakfast  . Linaclotide  145 mcg Oral Daily  . oxyCODONE  10 mg Oral Q6H  . pantoprazole  40 mg Oral BID  . pregabalin  50 mg Oral TID  . traZODone  150 mg Oral QHS  . valACYclovir  1,000 mg Oral TID   Continuous Infusions: . dextrose 5 % with KCl 20 mEq / L 20 mEq (09/08/13 0800)    **Reviewed in detail by the Attending Physician  Data Reviewed: Basic Metabolic Panel:  Recent Labs Lab 09/02/13 1541 09/03/13 0937 09/04/13 0300 09/05/13 0353 09/06/13 0400 09/08/13 1055  NA 141 143 144 148* 151* 141  K 3.8 3.5* 3.3* 3.4* 3.3* 4.0  CL 98 99 101 97 99 99  CO2 29 33* 34* 39* 37* 24  GLUCOSE 203* 103* 88 77 76 123*  BUN 30* 29* 27* 29* 22 21  CREATININE 1.37* 1.21* 1.11* 1.13* 1.04 1.31*  CALCIUM 8.6 8.0* 7.9* 8.3* 8.9 9.1  MG 1.7  --   --   --   --   --   PHOS 3.6  --   --   --   --   --  Liver Function Tests:  Recent Labs Lab 09/02/13 1541  AST 46*  ALT 51*  ALKPHOS 104  BILITOT 0.6  PROT 5.9*  ALBUMIN 2.4*   No results found for this basename: LIPASE, AMYLASE,  in the last 168 hours No results found for this basename: AMMONIA,  in the last 168 hours CBC:  Recent Labs Lab 09/02/13 1145 09/02/13 1159 09/03/13 0937 09/04/13 0300 09/05/13 0353  WBC 9.6  --  11.5* 9.5 8.3  NEUTROABS 7.9*  --   --   --   --   HGB 12.8 13.6 11.0* 10.2* 10.8*  HCT 38.7 40.0 33.1* 30.7* 32.7*  MCV 96.8  --  94.3 94.8 95.6  PLT 185  --  164 161 159   Cardiac  Enzymes:  Recent Labs Lab 09/02/13 1530  TROPONINI <0.30   BNP (last 3 results)  Recent Labs  08/20/13 1926 09/02/13 1145  PROBNP 736.3* 622.9*   CBG:  Recent Labs Lab 09/06/13 2203 09/07/13 0809 09/07/13 2132 09/08/13 0727 09/08/13 1119  GLUCAP 83 79 100* 104* 128*    Recent Results (from the past 240 hour(s))  URINE CULTURE     Status: None   Collection Time    09/02/13  2:58 PM      Result Value Range Status   Specimen Description URINE, RANDOM   Final   Special Requests NONE   Final   Culture  Setup Time     Final   Value: 09/02/2013 20:23     Performed at [place]     Final   Value: NO GROWTH     Performed at [facility]   Culture     Final   Value: NO GROWTH     Performed at [facility]   Report Status 09/03/2013 FINAL   Final  CULTURE, BLOOD (ROUTINE X 2)     Status: None   Collection Time    09/02/13  3:00 PM      Result Value Range Status   Specimen Description BLOOD HAND RIGHT   Final   Special Requests BOTTLES DRAWN AEROBIC AND ANAEROBIC 10CC   Final   Culture  Setup Time     Final   Value: 09/02/2013 20:25     Performed at [facility]   Culture     Final   Value: NO GROWTH 5 DAYS     Performed at [facility]   Report Status 09/08/2013 FINAL   Final  CULTURE, BLOOD (ROUTINE X 2)     Status: None   Collection Time    09/02/13  3:25 PM      Result Value Range Status   Specimen Description BLOOD ARM RIGHT   Final   Special Requests BOTTLES DRAWN AEROBIC AND ANAEROBIC 10CC   Final   Culture  Setup Time     Final   Value: 09/02/2013 20:25     Performed at [facility]   Culture     Final   Value: NO GROWTH 5 DAYS     Performed at [facility]   Report Status 09/08/2013 FINAL   Final  CULTURE, RESPIRATORY (NON-EXPECTORATED)     Status: None   Collection Time    09/02/13  4:02 PM      Result Value Range Status   Specimen Description TRACHEAL ASPIRATE   Final    Special Requests NONE   Final   Gram Stain  Final   Value: RARE WBC PRESENT, PREDOMINANTLY MONONUCLEAR     NO SQUAMOUS EPITHELIAL CELLS SEEN     ABUNDANT GRAM NEGATIVE RODS     MODERATE YEAST     Performed at [facility]   Culture     Final   Value: ABUNDANT BURKHOLDERIA CEPACIA     Performed at [facility]   Report Status 09/06/2013 FINAL   Final   Organism ID, Bacteria BURKHOLDERIA CEPACIA   Final  URINE CULTURE     Status: None   Collection Time    09/06/13  9:47 PM      Result Value Range Status   Specimen Description URINE, CATHETERIZED   Final   Special Requests NONE   Final   Culture  Setup Time     Final   Value: 09/07/2013 02:49     Performed at [place]     Final   Value: NO GROWTH     Performed at [facility]   Culture     Final   Value: NO GROWTH     Performed at [facility]   Report Status 09/07/2013 FINAL   Final     Studies:  Recent x-ray studies have been reviewed in detail by the Attending Physician  Time spent : 35 minutes     Erin Hearing, ANP [facility] Office  [phone] Pager [phone]  **If unable to reach the above provider after paging please contact the [place] @ [phone]  On-Call/Text Page:      [email or web address]      password [email or web address]  If 7PM-7AM, please contact night-coverage [email or web address] Password [email or web address] 09/08/2013, 3:05 PM   LOS: 6 days   I have examined the patient, reviewed the chart and modified the above note which I agree with.   Kevion Fatheree,MD [id number] 09/08/2013, 5:39 PM

## 2013-09-09 ENCOUNTER — Ambulatory Visit: Payer: Medicare Other | Admitting: Cardiology

## 2013-09-09 LAB — BASIC METABOLIC PANEL
BUN: 22 mg/dL (ref 6–23)
CO2: 25 mEq/L (ref 19–32)
CREATININE: 1.36 mg/dL — AB (ref 0.50–1.10)
Calcium: 8.5 mg/dL (ref 8.4–10.5)
Chloride: 107 mEq/L (ref 96–112)
GFR calc Af Amer: 52 mL/min — ABNORMAL LOW (ref >90)
GFR, EST NON AFRICAN AMERICAN: 45 mL/min — AB (ref >90)
GLUCOSE: 90 mg/dL (ref 70–99)
POTASSIUM: 3.9 meq/L (ref 3.7–5.3)
Sodium: 145 mEq/L (ref 137–147)

## 2013-09-09 MED ORDER — ENSURE COMPLETE PO LIQD
237.0000 mL | Freq: Two times a day (BID) | ORAL | Status: DC
  Administered 2013-09-09 – 2013-09-10 (×2): 237 mL via ORAL

## 2013-09-09 MED ORDER — IPRATROPIUM-ALBUTEROL 0.5-2.5 (3) MG/3ML IN SOLN
3.0000 mL | Freq: Two times a day (BID) | RESPIRATORY_TRACT | Status: DC
  Administered 2013-09-10: 3 mL via RESPIRATORY_TRACT
  Filled 2013-09-09: qty 3

## 2013-09-09 MED ORDER — ACETYLCYSTEINE 20 % IN SOLN
3.0000 mL | Freq: Two times a day (BID) | RESPIRATORY_TRACT | Status: DC
  Administered 2013-09-09: 22:00:00 via RESPIRATORY_TRACT
  Administered 2013-09-09: 3 mL via RESPIRATORY_TRACT
  Administered 2013-09-10: 09:00:00 via RESPIRATORY_TRACT
  Filled 2013-09-09 (×6): qty 4

## 2013-09-09 MED ORDER — BUDESONIDE 0.25 MG/2ML IN SUSP
0.5000 mg | Freq: Two times a day (BID) | RESPIRATORY_TRACT | Status: DC
  Administered 2013-09-09 – 2013-09-10 (×2): 0.5 mg via RESPIRATORY_TRACT
  Filled 2013-09-09 (×5): qty 4

## 2013-09-09 NOTE — Progress Notes (Signed)
[facility] Progress Note    [name] [id number] DOB: 1960/11/03 DOA: 09/02/2013 PCP: [name], NP  Brief narrative: 53 year old female patient with multiple medical problems. Just discharged from [facility] on 08/26/2013 after being treated for hypercarbic respiratory failure, pneumonia, sequelae from chronic pain and failure to thrive. During the last hospitalization the patient was diagnosed with influenza AH 3 and secondary pneumonia and was treated with Tamiflu and antibiotics. She is chronically on 2-4 L of nasal cannula oxygen at home. Prior to that admission she had continued to smoke. During that admission patient required intubation and mechanical ventilation.  She returned to [facility] on 09/02/2013 with acute respiratory failure. Patient's husband called EMS to the home after several days of progressive shortness of breath and hypoxia. She was hypoxic upon evaluation by EMS. She was started on BiPAP with improvement in her oxygenation. Chest x-ray performed in the ER showed possible interstitial pneumonia and she was started on empiric broad-spectrum antibiotics. While in the ER her respiratory status worsened ABG demonstrated progressive hypercarbia. She was evaluated by critical care medicine and admitted to the ICU where she was later intubated.  Since admission patient's respiratory cultures were positive for an atypical organism (Burkholderia cepacia) but other cultures were negative. She also developed an atypical rash on her coccygeal/sacral area which was suspected obese shingles and she was started on empiric treatment for this. Cause of altered mentation her usual home medications including her benzodiazepines were held. She stabilized and was extubated on 09/05/2013. She was deemed appropriate to transfer out to the general medical floor on 09/06/2013.   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), post influenza -Stable on home level of oxygen 3 L per minute -Nonproductive cough -Continue supportive care and pulmonary toileting including mobilization -Continue antibiotics    Acute delirium -Resolved -Suspected was related to withdrawal of benzodiazepines and narcotics -Resumed usual meds 1/14    Chronic systolic congestive heart failure/Diastolic heart failure NYHA class 1 -Her outpatient cardiologist had planned for stress test but due to recurrent pulmonary illnesses this has been placed on hold -Last echo was completed in 2013-echo repeated this admission with mild LVH grade 1 diastolic dysfunction and hyperdynamic EF noting no regional wall motion abnormalities -Currently appears compensated but is requiring volume resuscitation for dehydration and hypernatremia so watch closely noting EF only mildly reduced in the 40-45% range   Mild ARF -suspect 2/2 recent Valtrex which was dc'd yesterday -follow labs    Hypernatremia/DH -Seems related to volume depletion -water deficit calculated at 2.5 L -Na now normal and she is drinking and voiding so will decrease IVFs to 50hr -Follow labs  ? Lichen planus vulva -has 3 cm x 1.5 cm white plaque like lesion on vulva- concerning for vulvar CA -pt instructed to FU with PCP who can refer her to GYN after dc- pt states does not have a GYN MD.    Shingles -Continue antiviral medication    Physical deconditioning/tachycardia -PT/OT evaluation -Suspect will need either CIR or skilled nursing facility at time of discharge noting patient prefers the former-PT now recommending home health PT with intermittent supervision-we'll continue to follow-may need to discuss with husband in advance CIR more appropriate setting -Suspect physical deconditioning contributing to recurrent tachycardia      History of anxiety - chronic  alprazolam -Resuming usual medications-husband clarified she is on twice  a day and not when necessary dosing    TOBACCO ABUSE -Patient proudly states has not resumed smoking since last admission (20 days0    GERD, chronic PPI use    FIBROMYALGIA -Home meds renewed 1/14 -Decreased Lyrica from 100 to 50 mg 3 times a day -Husband clarified patient on 10 mg Roxicodone every 6 hours scheduled-will try this dose with hold parameters if sedated but may need to decrease dose    History of depression, chronic antidepressants -Resumed usual home medications   DVT prophylaxis: Lovenox Code Status:  Full Family Communication: Family at bedside Disposition Plan/Expected LOS: Remain floor status- possible dc in am   Consultants: PCCM  Procedures: ETT 1/9 >> 1/12  L IJ CVL 1/9 >> 1/13  Echocardiogram pending  CULTURES:  Resp virus panel 12/28 >> Flu A positive  MRSA 1/9 >>> neg  Respiratory 1/9 >> Burkholderia cepacia  Urine 1/9 >>> neg  Blood 1/9 >> NEG  Varicella PCR 1/10 >>   Antibiotics: Vancomycin 1/9 >> 1/12  Zosyn 1/9 >> 1/13  Acyclovir 1/10 >> 1/13  Levofloxacin 1/13 >>  Valacyclovir 1/13 >>   HPI/Subjective: Patient alert and requesting to stop IVFs. No SOB- says is back to baseline  Objective: Blood pressure 116/71, pulse 125, temperature 98 F (36.7 C), temperature source Oral, resp. rate 21, height 5\' 2"  (1.575 m), weight 139 lb 8 oz (63.277 kg), SpO2 95.00%.  Intake/Output Summary (Last 24 hours) at 09/09/13 1344 Last data filed at 09/09/13 1107  Gross per 24 hour  Intake 2451.67 ml  Output   1120 ml  Net 1331.67 ml     Exam: General: No acute respiratory distress Lungs: Clear to auscultation bilaterally without wheezes or crackles,  4 L Cardiovascular: Regular tachycardic rate and rhythm without murmur gallop or rub normal S1 and S2, no peripheral edema or JVD Abdomen: Nontender, nondistended, soft, bowel sounds positive, no rebound, no ascites, no  appreciable mass Musculoskeletal: No significant cyanosis, clubbing of bilateral lower extremities Neurological: Alert and oriented x 3, moves all extremities x 4 without focal neurological deficits, CN 2-12 intact  Scheduled Meds:  Scheduled Meds: . acetylcysteine  3 mL Nebulization BID  . ALPRAZolam  0.5 mg Oral BID  . antiseptic oral rinse  15 mL Mouth Rinse BID  . budesonide  0.5 mg Nebulization BID  . DULoxetine  60 mg Oral BID  . enoxaparin (LOVENOX) injection  40 mg Subcutaneous Q24H  . ferrous sulfate  325 mg Oral Q breakfast  . ipratropium-albuterol  3 mL Nebulization QID  . levofloxacin  750 mg Oral Daily  . levothyroxine  150 mcg Oral QAC breakfast  . Linaclotide  145 mcg Oral Daily  . oxyCODONE  5 mg Oral Q6H  . pantoprazole  40 mg Oral BID  . pregabalin  50 mg Oral TID  . traZODone  150 mg Oral QHS   Continuous Infusions: . sodium chloride 50 mL/hr at 09/09/13 1234    **Reviewed in detail by the Attending Physician  Data Reviewed: Basic Metabolic Panel:  Recent Labs Lab 09/02/13 1541  09/04/13 0300 09/05/13 0353 09/06/13 0400 09/08/13 1055 09/09/13 0313  NA 141  < > 144 148* 151* 141 145  K 3.8  < > 3.3* 3.4* 3.3* 4.0 3.9  CL 98  < > 101 97 99 99 107  CO2 29  < > 34* 39* 37* 24 25  GLUCOSE 203*  < > 88 77 76 123* 90  BUN 30*  < > 27*  29* 22 21 22   CREATININE 1.37*  < > 1.11* 1.13* 1.04 1.31* 1.36*  CALCIUM 8.6  < > 7.9* 8.3* 8.9 9.1 8.5  MG 1.7  --   --   --   --   --   --   PHOS 3.6  --   --   --   --   --   --   < > = values in this interval not displayed. Liver Function Tests:  Recent Labs Lab 09/02/13 1541  AST 46*  ALT 51*  ALKPHOS 104  BILITOT 0.6  PROT 5.9*  ALBUMIN 2.4*   No results found for this basename: LIPASE, AMYLASE,  in the last 168 hours No results found for this basename: AMMONIA,  in the last 168 hours CBC:  Recent Labs Lab 09/03/13 0937 09/04/13 0300 09/05/13 0353  WBC 11.5* 9.5 8.3  HGB 11.0* 10.2* 10.8*  HCT  33.1* 30.7* 32.7*  MCV 94.3 94.8 95.6  PLT 164 161 159   Cardiac Enzymes:  Recent Labs Lab 09/02/13 1530  TROPONINI <0.30   BNP (last 3 results)  Recent Labs  08/20/13 1926 09/02/13 1145  PROBNP 736.3* 622.9*   CBG:  Recent Labs Lab 09/06/13 2203 09/07/13 0809 09/07/13 2132 09/08/13 0727 09/08/13 1119  GLUCAP 83 79 100* 104* 128*    Recent Results (from the past 240 hour(s))  URINE CULTURE     Status: None   Collection Time    09/02/13  2:58 PM      Result Value Range Status   Specimen Description URINE, RANDOM   Final   Special Requests NONE   Final   Culture  Setup Time     Final   Value: 09/02/2013 20:23     Performed at [facility]     Final   Value: NO GROWTH     Performed at [facility]   Culture     Final   Value: NO GROWTH     Performed at [facility]   Report Status 09/03/2013 FINAL   Final  CULTURE, BLOOD (ROUTINE X 2)     Status: None   Collection Time    09/02/13  3:00 PM      Result Value Range Status   Specimen Description BLOOD HAND RIGHT   Final   Special Requests BOTTLES DRAWN AEROBIC AND ANAEROBIC 10CC   Final   Culture  Setup Time     Final   Value: 09/02/2013 20:25     Performed at [facility]   Culture     Final   Value: NO GROWTH 5 DAYS     Performed at [facility]   Report Status 09/08/2013 FINAL   Final  CULTURE, BLOOD (ROUTINE X 2)     Status: None   Collection Time    09/02/13  3:25 PM      Result Value Range Status   Specimen Description BLOOD ARM RIGHT   Final   Special Requests BOTTLES DRAWN AEROBIC AND ANAEROBIC 10CC   Final   Culture  Setup Time     Final   Value: 09/02/2013 20:25     Performed at [facility]   Culture     Final   Value: NO GROWTH 5 DAYS     Performed at [facility]   Report Status 09/08/2013 FINAL   Final  CULTURE, RESPIRATORY (NON-EXPECTORATED)     Status: None   Collection  Time    09/02/13  4:02 PM      Result  Value Range Status   Specimen Description TRACHEAL ASPIRATE   Final   Special Requests NONE   Final   Gram Stain     Final   Value: RARE WBC PRESENT, PREDOMINANTLY MONONUCLEAR     NO SQUAMOUS EPITHELIAL CELLS SEEN     ABUNDANT GRAM NEGATIVE RODS     MODERATE YEAST     Performed at [facility]   Culture     Final   Value: ABUNDANT BURKHOLDERIA CEPACIA     Performed at [facility]   Report Status 09/06/2013 FINAL   Final   Organism ID, Bacteria BURKHOLDERIA CEPACIA   Final  URINE CULTURE     Status: None   Collection Time    09/06/13  9:47 PM      Result Value Range Status   Specimen Description URINE, CATHETERIZED   Final   Special Requests NONE   Final   Culture  Setup Time     Final   Value: 09/07/2013 02:49     Performed at [place]     Final   Value: NO GROWTH     Performed at [facility]   Culture     Final   Value: NO GROWTH     Performed at [facility]   Report Status 09/07/2013 FINAL   Final     Studies:  Recent x-ray studies have been reviewed in detail by the Attending Physician  Time spent : 35 minutes     Erin Hearing, ANP [facility] Office  [phone] Pager [phone]  **If unable to reach the above provider after paging please contact the [place] @ [phone]  On-Call/Text Page:      [email or web address]      password [email or web address]  If 7PM-7AM, please contact night-coverage [email or web address] Password [email or web address] 09/09/2013, 1:44 PM   LOS: 7 days   I have examined the patient, reviewed the chart and modified the above note which I agree with.   GGYIRS,WNIOE,VO [phone] 09/09/2013, 5:05 PM

## 2013-09-09 NOTE — Progress Notes (Signed)
Continued protocol from this AM and changed Duo neb to BID.

## 2013-09-09 NOTE — Plan of Care (Signed)
BP soft so since tachycardia is sinus will stop Tiazac for now  [place]

## 2013-09-09 NOTE — Progress Notes (Signed)
NUTRITION FOLLOW UP  Intervention:   1.  General healthful diet; encourage intake of foods and beverages as able.  RD to follow and assess for nutritional adequacy. 2.  Supplements; Ensure Complete po BID, each supplement provides 350 kcal and 13 grams of protein  Nutrition Dx:   Inadequate oral intake related to inability to eat as evidenced by NPO status.   Goal:  Pt to meet >/= 90% of their estimated nutrition needs.  Not met.   Monitor:  TF initiation/tolerance  Weight trends  Labs  PO intake  Assessment:   53 yo wf just discharged 1-2 from [facility] with hypercarbic resp failure, pna, chronic pain and FTT.  Pt continues CHO Mod Medium diet with thin liquids.  Intake has remained suboptimal at 0-50% of meals. Pt unavilable at time of visit- receiving respiratory treatment.  RD to order supplements and will continue to follow.   Height: Ht Readings from Last 1 Encounters:  09/08/13 _0  (1.575 m)    Weight Status:   Wt Readings from Last 1 Encounters:  09/08/13 139 lb 8 oz (63.277 kg)    Re-estimated needs:  Kcal: 1820-2080 Protein: 84-100g Fluid: ~2.0 L/day  Skin:  Generalized edema  Diet Order: Carb Control   Intake/Output Summary (Last 24 hours) at 09/09/13 1431 Last data filed at 09/09/13 1107  Gross per 24 hour  Intake 2351.67 ml  Output   1120 ml  Net 1231.67 ml    Last BM: 1/15   Labs:   Recent Labs Lab 09/02/13 1541  09/06/13 0400 09/08/13 1055 09/09/13 0313  NA 141  < > 151* 141 145  K 3.8  < > 3.3* 4.0 3.9  CL 98  < > 99 99 107  CO2 29  < > 37* 24 25  BUN 30*  < > _1 CREATININE 1.37*  < > 1.04 1.31* 1.36*  CALCIUM 8.6  < > 8.9 9.1 8.5  MG 1.7  --   --   --   --   PHOS 3.6  --   --   --   --   GLUCOSE 203*  < > 76 123* 90  < > = values in this interval not displayed.  CBG (last 3)   Recent Labs  09/07/13 2132 09/08/13 0727 09/08/13 1119  GLUCAP 100* 104* 128*    Scheduled Meds: . acetylcysteine  3 mL  Nebulization BID  . ALPRAZolam  0.5 mg Oral BID  . antiseptic oral rinse  15 mL Mouth Rinse BID  . budesonide  0.5 mg Nebulization BID  . DULoxetine  60 mg Oral BID  . enoxaparin (LOVENOX) injection  40 mg Subcutaneous Q24H  . ferrous sulfate  325 mg Oral Q breakfast  . ipratropium-albuterol  3 mL Nebulization QID  . levofloxacin  750 mg Oral Daily  . levothyroxine  150 mcg Oral QAC breakfast  . Linaclotide  145 mcg Oral Daily  . oxyCODONE  5 mg Oral Q6H  . pantoprazole  40 mg Oral BID  . pregabalin  50 mg Oral TID  . traZODone  150 mg Oral QHS    Continuous Infusions: . sodium chloride 50 mL/hr at 09/09/13 1234    Brynda Greathouse, MS RD LDN Clinical Inpatient Dietitian Pager: [phone] Weekend/After hours pager: [phone]

## 2013-09-10 DIAGNOSIS — J9620 Acute and chronic respiratory failure, unspecified whether with hypoxia or hypercapnia: Secondary | ICD-10-CM

## 2013-09-10 LAB — CBC
HCT: 34.7 % — ABNORMAL LOW (ref 36.0–46.0)
Hemoglobin: 11.1 g/dL — ABNORMAL LOW (ref 12.0–15.0)
MCH: 31.2 pg (ref 26.0–34.0)
MCHC: 32 g/dL (ref 30.0–36.0)
MCV: 97.5 fL (ref 78.0–100.0)
PLATELETS: 196 10*3/uL (ref 150–400)
RBC: 3.56 MIL/uL — ABNORMAL LOW (ref 3.87–5.11)
RDW: 17 % — ABNORMAL HIGH (ref 11.5–15.5)
WBC: 8.5 10*3/uL (ref 4.0–10.5)

## 2013-09-10 LAB — BASIC METABOLIC PANEL
BUN: 18 mg/dL (ref 6–23)
CALCIUM: 8.6 mg/dL (ref 8.4–10.5)
CO2: 24 mEq/L (ref 19–32)
CREATININE: 1.26 mg/dL — AB (ref 0.50–1.10)
Chloride: 107 mEq/L (ref 96–112)
GFR, EST AFRICAN AMERICAN: 57 mL/min — AB (ref >90)
GFR, EST NON AFRICAN AMERICAN: 49 mL/min — AB (ref >90)
Glucose, Bld: 84 mg/dL (ref 70–99)
Potassium: 3.9 mEq/L (ref 3.7–5.3)
Sodium: 143 mEq/L (ref 137–147)

## 2013-09-10 MED ORDER — LEVOFLOXACIN 750 MG PO TABS: 750.0000 mg | ORAL_TABLET | Freq: Every day | ORAL | Status: DC

## 2013-09-10 NOTE — Progress Notes (Signed)
Patient discharged to home with instructions, claimed that [place] came yesterday to see her for home care, very eager to go, accompanied by husband.

## 2013-09-10 NOTE — Discharge Summary (Signed)
Physician Discharge Summary  [name] [id number] DOB: [birth year] DOA: 09/02/2013  PCP: [name], NP  Admit date: 09/02/2013 Discharge date: 09/10/2013  Time spent: >45 minutes  Recommendations for Outpatient Follow-up:  1. 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 Just discharged from [facility] on 08/26/2013 after being treated for hypercarbic respiratory failure, pneumonia, sequelae from chronic pain and failure to thrive. During the last hospitalization the patient was diagnosed with influenza AH 3 and secondary pneumonia and was treated with Tamiflu and antibiotics. She is chronically on 2-4 L of nasal cannula oxygen at home. Prior to that admission she had continued to smoke. During that admission patient required intubation and mechanical ventilation.  She returned to [facility] on 09/02/2013 with acute respiratory failure. Patient's husband called EMS to the home after several days of progressive shortness of breath and hypoxia. She was hypoxic upon evaluation by EMS. She was started on BiPAP with improvement in her oxygenation. Chest x-ray performed in the ER showed possible  interstitial pneumonia and she was started on empiric broad-spectrum antibiotics. While in the ER her respiratory status worsened ABG demonstrated progressive hypercarbia. She was evaluated by critical care medicine and admitted to the ICU where she was later intubated.  Since admission patient's respiratory cultures were positive for an atypical organism (Burkholderia cepacia) but other cultures were negative. She also developed an atypical rash on her coccygeal/sacral area which was suspected obese shingles and she was started on empiric treatment for this. Cause of altered mentation her usual home medications including her benzodiazepines were held. She stabilized and was extubated on 09/05/2013. She was deemed appropriate to transfer out to the general medical floor on 09/06/2013.    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), post influenza  -Stable on home level of oxygen 3 L per minute  -Nonproductive cough  -Continue supportive care and pulmonary toileting including mobilization  Acute delirium  -Resolved  -Suspected was related to withdrawal of benzodiazepines and narcotics  -Resumed usual meds [id number]  Chronic systolic congestive heart failure/Diastolic heart failure NYHA class 1  -Her outpatient cardiologist had planned for stress test but due to recurrent pulmonary illnesses this has been placed on hold  -Last echo was completed in 2013-echo repeated this admission with mild LVH grade 1 diastolic dysfunction and hyperdynamic EF noting no regional wall motion abnormalities  -Currently appears compensated but is requiring volume resuscitation for dehydration and hypernatremia so watch closely noting EF only mildly reduced in the 40-45% range   Mild ARF  -suspect 2/2 recent Valtrex which was dc'd 2  days ago - Cr improved  Hypernatremia/DH  -Seems  related to volume depletion -water deficit calculated at 2.5 L  -Na now normal and she is drinking and voiding so will decrease IVFs to 50hr  -Follow labs   ? Lichen planus vulva  -has 3 cm x 1.5 cm white plaque like lesion on vulva- concerning for vulvar CA  -pt instructed to FU with PCP who can refer her to GYN after dc- pt states does not have a GYN MD.   Shingles  -Continue antiviral medication   Physical deconditioning/tachycardia  HHPT  History of anxiety - chronic alprazolam  -Resuming usual medications-husband clarified she is on twice a day and not when necessary dosing   TOBACCO ABUSE  -Patient proudly states has not resumed smoking since last admission (20 days0   GERD, chronic PPI use   FIBROMYALGIA  -Home meds renewed 1/14  -Decreased Lyrica from 100 to 50 mg 3 times a day   History of depression, chronic antidepressants  -Resumed usual home medications      Procedures: ETT 1/9 >> 1/12  L IJ CVL 1/9 >> 1/13   Consultations:  PCCM  Discharge Exam: Filed Vitals:   09/10/13 0456  BP: 109/68  Pulse: 118  Temp: 99 F (37.2 C)  Resp: 18    General: AAo x 3, no distress Cardiovascular: RRR, no murmurs Respiratory: CTA b/l   Discharge Instructions      Discharge Orders   Future Orders Complete By Expires   Diet - low sodium heart healthy  As directed    Increase activity slowly  As directed        Medication List    STOP taking these medications       amoxicillin-clavulanate 875-125 MG per tablet  Commonly known as:  AUGMENTIN     doxycycline 100 MG tablet  Commonly known as:  VIBRA-TABS     nicotine 21 mg/24hr patch  Commonly known as:  NICODERM CQ - dosed in mg/24 hours     predniSONE 10 MG tablet  Commonly known as:  DELTASONE      TAKE these medications       ALPRAZolam 0.5 MG tablet  Commonly known as:  XANAX  Take 0.5 mg by mouth 2 (two) times daily as needed for anxiety.     CALCIUM 600 + D PO  Take 1 tablet by mouth  2 (two) times daily.     diltiazem 300 MG 24 hr capsule  Commonly known as:  TIAZAC  Take 300 mg by mouth daily.     DULoxetine 60 MG capsule  Commonly known as:  CYMBALTA  Take 60 mg by mouth 2 (two) times daily.     ferrous sulfate 325 (65 FE) MG EC tablet  Take 325 mg by mouth daily.     Fluticasone-Salmeterol 250-50 MCG/DOSE Aepb  Commonly known as:  ADVAIR  Inhale 1 puff into the lungs every 12 (twelve) hours.     levofloxacin 750 MG tablet  Commonly known as:  LEVAQUIN  Take 1 tablet (750 mg total) by mouth daily.     levothyroxine 150 MCG tablet  Commonly known as:  SYNTHROID, LEVOTHROID  Take 150 mcg by mouth daily.     LINZESS 145 MCG Caps capsule  Generic drug:  Linaclotide  Take 145 mcg by mouth daily.     loratadine 10 MG tablet  Commonly known as:  CLARITIN  Take 10 mg by mouth at bedtime.     Oxycodone  HCl 10 MG Tabs  Take 0.5 tablets (5 mg total) by mouth every 6 (six) hours as needed (for pain).     pantoprazole 40 MG tablet  Commonly known as:  PROTONIX  Take 1 tablet (40 mg total) by mouth 2 (two) times daily.     pregabalin 100 MG capsule  Commonly known as:  LYRICA  Take 100 mg by mouth 3 (three) times daily.     tiotropium 18 MCG inhalation capsule  Commonly known as:  SPIRIVA  Place 18 mcg into inhaler and inhale daily.     traZODone 150 MG tablet  Commonly known as:  DESYREL  Take 150 mg by mouth at bedtime.     vitamin B-12 1000 MCG tablet  Commonly known as:  CYANOCOBALAMIN  Take 5,000 mcg by mouth daily.       Allergies  Allergen Reactions  . Tape Itching and Rash      The results of significant diagnostics from this hospitalization (including imaging, microbiology, ancillary and laboratory) are listed below for reference.    Significant Diagnostic Studies: Ct Head Wo Contrast  08/20/2013   CLINICAL DATA:  Lethargy, altered mental status  EXAM: CT HEAD WITHOUT CONTRAST  TECHNIQUE: Contiguous axial images were obtained  from the base of the skull through the vertex without contrast.  COMPARISON:  01/27/2013  FINDINGS: Mild brain atrophy without acute intracranial hemorrhage, definite infarction, mass lesion, midline shift, herniation, hydrocephalus, or extra-axial fluid collection. Gray-white matter differentiation maintained. No focal mass effect or edema. Cisterns are patent. Mild cerebellar atrophy. Mastoids clear. Ethmoid and right maxillary sinus mucosal thickening. Right maxillary air-fluid level. Findings compatible with sinusitis. Other sinuses clear.  IMPRESSION: No acute intracranial finding  Ethmoid and right maxillary sinusitis   Electronically Signed   By: Daryll Brod M.D.   On: 08/20/2013 21:42   Dg Chest Port 1 View  09/05/2013   CLINICAL DATA:  Evaluate for infiltrates. Check endotracheal tube position  EXAM: PORTABLE CHEST - 1 VIEW  COMPARISON:  09/04/2013  FINDINGS: Endotracheal tube ends at the level of the lower thoracic trachea, similar to yesterday. Orogastric tube and left IJ central venous catheter in stable position.  No appreciable change in parenchymal opacities in the lower lungs, with small left effusion. No evidence of air leak, as permitted by patient positioning.  IMPRESSION: 1. Good positioning of tubes and lines. 2. Unchanged lower lung infiltrates, superimposed on emphysema. Small left effusion.   Electronically Signed   By: Jorje Guild M.D.   On: 09/05/2013 06:22   Dg Chest Port 1 View  09/04/2013   CLINICAL DATA:  Status post intubation  EXAM: PORTABLE CHEST - 1 VIEW  COMPARISON:  09/02/2013  FINDINGS: An endotracheal tube is again seen and now lies 1.9 cm above the carina. A left central venous line is again noted within the mid superior vena cava. A nasogastric catheter is again seen coursing into the stomach. Bibasilar infiltrative changes are again seen. No new focal abnormality is noted.  IMPRESSION: Tubes and lines as described above. Bibasilar infiltrates are again seen.    Electronically Signed   By: Inez Catalina M.D.   On: 09/04/2013 21:39   Dg Chest Port 1 View  09/04/2013   CLINICAL DATA:  Pulmonary infiltrates.  EXAM: PORTABLE CHEST - 1 VIEW  COMPARISON:  09/02/2013  FINDINGS: The endotracheal tube tip now projects at the carina and may be nearly in the right mainstem bronchus. Central line positioning is stable. Bilateral lower lobe  infiltrates are again present which are stable on the left and show partial clearing on the right. Probable small left pleural effusion remains.  IMPRESSION: Improvement in right lower lobe infiltrate. The left lower lobe infiltrate is relatively stable.   Electronically Signed   By: Aletta Edouard M.D.   On: 09/04/2013 09:16   Dg Chest Portable 1 View  09/02/2013   CLINICAL DATA:  Respiratory distress  EXAM: PORTABLE CHEST - 1 VIEW  COMPARISON:  September 02, 2013 11:45 a.m.  FINDINGS: Endotracheal tube is identified distal tip 2.1 cm from carina. Left jugular central venous line is noted with distal tip in the superior vena cava. Nasogastric tube is noted with distal tip not included film is the least in the stomach. There are bibasilar consolidations. The previously noted nodular opacity in the right perihilar region is not seen. The heart size is normal. The osseous structures are stable.  IMPRESSION: Persistent bibasilar consolidation. Previously noted nodular opacity in the right perihilar region not seen currently. Life supporting devices as described.   Electronically Signed   By: Abelardo Diesel M.D.   On: 09/02/2013 15:12   Dg Chest Port 1 View  09/02/2013   CLINICAL DATA:  Dyspnea and weakness  EXAM: PORTABLE CHEST - 1 VIEW  COMPARISON:  Portable chest x-ray of August 24, 2013  FINDINGS: The lungs are adequately inflated. The interstitial markings are increased bilaterally. These become nearly confluent in the right mid and lower lung. An area of subtle nodular density in the right perihilar region is demonstrated as well. The  cardiopericardial silhouette is mildly enlarged. The pulmonary vascularity is not engorged. The trachea is midline. No pleural effusion is evident.  IMPRESSION: The findings are consistent with interstitial pneumonia bilaterally. The infiltrates become nearly confluent in the right lower lung. In addition there is an area of confluent density in the right perihilar region which is new. A followup PA and lateral chest x-ray following therapy is recommended to assure clearing. Chest CT scanning may be ultimately indicated to exclude any underlying nodule or mass.   Electronically Signed   By: David  Martinique   On: 09/02/2013 11:59   Dg Chest Port 1 View  08/24/2013   CLINICAL DATA:  Pneumonia and weakness.  EXAM: PORTABLE CHEST - 1 VIEW  COMPARISON:  08/22/2013  FINDINGS: Improved aeration at the lung bases. There are residual patchy densities in both lower lungs. Upper lungs remain clear with emphysematous changes. Heart size is stable. Trachea is midline. No evidence for a pneumothorax.  IMPRESSION: Improving aeration at the lung bases.  Emphysematous disease.   Electronically Signed   By: Markus Daft M.D.   On: 08/24/2013 07:41   Dg Chest Port 1 View  08/22/2013   CLINICAL DATA:  Worsening bibasilar airspace disease.  EXAM: PORTABLE CHEST - 1 VIEW  COMPARISON:  08/21/2013 and 08/20/2013  FINDINGS: There are increased lung densities at the right lung base. Persistent densities at the left lung base. Heart size is stable. Upper lungs are clear. The endotracheal tube and nasogastric tube have been removed. Lucency in the upper lungs are consistent with emphysema.  IMPRESSION: Persistent bibasilar lung densities with mild progression on the right side.  Removal of support apparatuses.  Emphysematous changes.   Electronically Signed   By: Markus Daft M.D.   On: 08/22/2013 07:57   Portable Chest Xray In Am  08/21/2013   CLINICAL DATA:  COPD  EXAM: PORTABLE CHEST - 1 VIEW  COMPARISON:  [facility] [employer]  FINDINGS:  Endotracheal and NG tubes stable. Bibasilar airspace disease left greater than right worse. No pneumothorax. Upper normal heart size.  IMPRESSION: Worsening bibasilar airspace disease left greater than right.   Electronically Signed   By: Maryclare Bean M.D.   On: 08/21/2013 07:24   Dg Chest Portable 1 View  08/20/2013   CLINICAL DATA:  Intubation  EXAM: PORTABLE CHEST - 1 VIEW  COMPARISON:  Films same date  FINDINGS: Endotracheal tube has been adjusted and is now appropriately positioned. Nasogastric tube tip terminates below the level of the hemidiaphragms but is not included in the field of view. No significant change otherwise.  IMPRESSION: Appropriately positioned endotracheal tube.  Nasogastric tube tip terminates below the level of the hemidiaphragms but is not included in the field of view.   Electronically Signed   By: Conchita Paris M.D.   On: 08/20/2013 20:09   Dg Chest Portable 1 View  08/20/2013   CLINICAL DATA:  Intubation  EXAM: PORTABLE CHEST - 1 VIEW  COMPARISON:  08/20/2013 at 2:23 p.m.  FINDINGS: Endotracheal tube tip projects over the proximal right mainstem bronchus. Heart size is at upper limits of normal. Crowding of the bronchovascular markings is reidentified without pneumothorax.  IMPRESSION: Right mainstem bronchus intubation. The image was subsequently repeated and a followup study will be dictated separately.   Electronically Signed   By: Conchita Paris M.D.   On: 08/20/2013 20:00   Dg Abd Portable 1v  08/20/2013   CLINICAL DATA:  Feeding tube placement  EXAM: PORTABLE ABDOMEN - 1 VIEW  COMPARISON:  None.  FINDINGS: Feeding tube tip is in the distal stomach directed to the pyloric channel. Prior cholecystectomy noted. Nonobstructive bowel gas pattern. Postop changes of the left hip.  IMPRESSION: Feeding tube tip distal stomach.   Electronically Signed   By: Daryll Brod M.D.   On: 08/20/2013 21:55    Microbiology: Recent Results (from the past 240 hour(s))  URINE CULTURE      Status: None   Collection Time    09/02/13  2:58 PM      Result Value Range Status   Specimen Description URINE, RANDOM   Final   Special Requests NONE   Final   Culture  Setup Time     Final   Value: 09/02/2013 20:23     Performed at [facility]     Final   Value: NO GROWTH     Performed at [facility]   Culture     Final   Value: NO GROWTH     Performed at [facility]   Report Status 09/03/2013 FINAL   Final  CULTURE, BLOOD (ROUTINE X 2)     Status: None   Collection Time    09/02/13  3:00 PM      Result Value Range Status   Specimen Description BLOOD HAND RIGHT   Final   Special Requests BOTTLES DRAWN AEROBIC AND ANAEROBIC 10CC   Final   Culture  Setup Time     Final   Value: 09/02/2013 20:25     Performed at [facility]   Culture     Final   Value: NO GROWTH 5 DAYS     Performed at [facility]   Report Status 09/08/2013 FINAL   Final  CULTURE, BLOOD (ROUTINE X 2)     Status: None   Collection Time    09/02/13  3:25 PM  Result Value Range Status   Specimen Description BLOOD ARM RIGHT   Final   Special Requests BOTTLES DRAWN AEROBIC AND ANAEROBIC 10CC   Final   Culture  Setup Time     Final   Value: 09/02/2013 20:25     Performed at [facility]   Culture     Final   Value: NO GROWTH 5 DAYS     Performed at [facility]   Report Status 09/08/2013 FINAL   Final  CULTURE, RESPIRATORY (NON-EXPECTORATED)     Status: None   Collection Time    09/02/13  4:02 PM      Result Value Range Status   Specimen Description TRACHEAL ASPIRATE   Final   Special Requests NONE   Final   Gram Stain     Final   Value: RARE WBC PRESENT, PREDOMINANTLY MONONUCLEAR     NO SQUAMOUS EPITHELIAL CELLS SEEN     ABUNDANT GRAM NEGATIVE RODS     MODERATE YEAST     Performed at [facility]   Culture     Final   Value: ABUNDANT BURKHOLDERIA CEPACIA     Performed at [facility]   Report Status  09/06/2013 FINAL   Final   Organism ID, Bacteria BURKHOLDERIA CEPACIA   Final  URINE CULTURE     Status: None   Collection Time    09/06/13  9:47 PM      Result Value Range Status   Specimen Description URINE, CATHETERIZED   Final   Special Requests NONE   Final   Culture  Setup Time     Final   Value: 09/07/2013 02:49     Performed at [place]     Final   Value: NO GROWTH     Performed at [facility]   Culture     Final   Value: NO GROWTH     Performed at [facility]   Report Status 09/07/2013 FINAL   Final     Labs: Basic Metabolic Panel:  Recent Labs Lab 09/05/13 0353 09/06/13 0400 09/08/13 1055 09/09/13 0313 09/10/13 0323  NA 148* 151* 141 145 143  K 3.4* 3.3* 4.0 3.9 3.9  CL 97 99 99 107 107  CO2 39* 37* 24 25 24   GLUCOSE 77 76 123* 90 84  BUN 29* 22 21 22 18   CREATININE 1.13* 1.04 1.31* 1.36* 1.26*  CALCIUM 8.3* 8.9 9.1 8.5 8.6   Liver Function Tests: No results found for this basename: AST, ALT, ALKPHOS, BILITOT, PROT, ALBUMIN,  in the last 168 hours No results found for this basename: LIPASE, AMYLASE,  in the last 168 hours No results found for this basename: AMMONIA,  in the last 168 hours CBC:  Recent Labs Lab 09/04/13 0300 09/05/13 0353 09/10/13 0323  WBC 9.5 8.3 8.5  HGB 10.2* 10.8* 11.1*  HCT 30.7* 32.7* 34.7*  MCV 94.8 95.6 97.5  PLT 161 159 196   Cardiac Enzymes: No results found for this basename: CKTOTAL, CKMB, CKMBINDEX, TROPONINI,  in the last 168 hours BNP: BNP (last 3 results)  Recent Labs  08/20/13 1926 09/02/13 1145  PROBNP 736.3* 622.9*   CBG:  Recent Labs Lab 09/06/13 2203 09/07/13 0809 09/07/13 2132 09/08/13 0727 09/08/13 1119  GLUCAP 83 79 100* 104* 128*       SignedDebbe Odea, MD [facility] 09/10/2013, 10:29 AM

## 2013-09-10 NOTE — Progress Notes (Signed)
   CARE MANAGEMENT NOTE 09/10/2013  Patient:  [name], [name]   Account Number:  [id number]  Date Initiated:  09/02/2013  Documentation initiated by:  Elissa Hefty  Subjective/Objective Assessment:   adm w hypoxia     Action/Plan:   lives w husband   Anticipated DC Date:     Anticipated DC Plan:           Choice offered to / List presented to:          [facility] arranged  HH-2 PT      [facility]   Status of service:  Completed, signed off Medicare Important Message given?   (If response is "NO", the following Medicare IM given date fields will be blank) Date Medicare IM given:   Date Additional Medicare IM given:    Discharge Disposition:  [place]  Per UR Regulation:  Reviewed for med. necessity/level of care/duration of stay  If discussed at [place] of Stay Meetings, dates discussed:   09/08/2013    Comments:  09/10/13 16:51 CM notified [facility] of pt discharge.  No other CM needs were communicated.  Mariane Masters, BSN, [place] [phone].  08-30-13 Patient was  active with [facility] for [facility] for wound care prior to admission  . PT recommending HHPT . Will need orders for both at discharge . Thanks Magdalen Spatz RN BSN [phone]

## 2013-09-19 ENCOUNTER — Emergency Department (HOSPITAL_COMMUNITY): Payer: Medicare Other

## 2013-09-19 ENCOUNTER — Encounter (HOSPITAL_COMMUNITY): Payer: Self-pay | Admitting: Emergency Medicine

## 2013-09-19 ENCOUNTER — Inpatient Hospital Stay (HOSPITAL_COMMUNITY): Payer: Medicare Other

## 2013-09-19 ENCOUNTER — Inpatient Hospital Stay (HOSPITAL_COMMUNITY)
Admission: EM | Admit: 2013-09-19 | Discharge: 2013-09-22 | DRG: 291 | Disposition: A | Discharge status: Home Health | Payer: Medicare Other | Attending: Internal Medicine | Admitting: Internal Medicine

## 2013-09-19 DIAGNOSIS — S22000A Wedge compression fracture of unspecified thoracic vertebra, initial encounter for closed fracture: Secondary | ICD-10-CM | POA: Diagnosis present

## 2013-09-19 DIAGNOSIS — R404 Transient alteration of awareness: Secondary | ICD-10-CM | POA: Diagnosis present

## 2013-09-19 DIAGNOSIS — M545 Low back pain, unspecified: Secondary | ICD-10-CM

## 2013-09-19 DIAGNOSIS — F3289 Other specified depressive episodes: Secondary | ICD-10-CM | POA: Diagnosis present

## 2013-09-19 DIAGNOSIS — J449 Chronic obstructive pulmonary disease, unspecified: Secondary | ICD-10-CM | POA: Diagnosis present

## 2013-09-19 DIAGNOSIS — J181 Lobar pneumonia, unspecified organism: Secondary | ICD-10-CM

## 2013-09-19 DIAGNOSIS — L439 Lichen planus, unspecified: Secondary | ICD-10-CM | POA: Diagnosis present

## 2013-09-19 DIAGNOSIS — F329 Major depressive disorder, single episode, unspecified: Secondary | ICD-10-CM | POA: Diagnosis present

## 2013-09-19 DIAGNOSIS — Z8659 Personal history of other mental and behavioral disorders: Secondary | ICD-10-CM

## 2013-09-19 DIAGNOSIS — N179 Acute kidney failure, unspecified: Secondary | ICD-10-CM | POA: Diagnosis not present

## 2013-09-19 DIAGNOSIS — S22009A Unspecified fracture of unspecified thoracic vertebra, initial encounter for closed fracture: Secondary | ICD-10-CM | POA: Diagnosis present

## 2013-09-19 DIAGNOSIS — R410 Disorientation, unspecified: Secondary | ICD-10-CM | POA: Diagnosis present

## 2013-09-19 DIAGNOSIS — IMO0001 Reserved for inherently not codable concepts without codable children: Secondary | ICD-10-CM | POA: Diagnosis present

## 2013-09-19 DIAGNOSIS — J9620 Acute and chronic respiratory failure, unspecified whether with hypoxia or hypercapnia: Secondary | ICD-10-CM | POA: Diagnosis present

## 2013-09-19 DIAGNOSIS — I509 Heart failure, unspecified: Secondary | ICD-10-CM | POA: Diagnosis present

## 2013-09-19 DIAGNOSIS — G4730 Sleep apnea, unspecified: Secondary | ICD-10-CM

## 2013-09-19 DIAGNOSIS — Z87891 Personal history of nicotine dependence: Secondary | ICD-10-CM

## 2013-09-19 DIAGNOSIS — T502X5A Adverse effect of carbonic-anhydrase inhibitors, benzothiadiazides and other diuretics, initial encounter: Secondary | ICD-10-CM | POA: Diagnosis not present

## 2013-09-19 DIAGNOSIS — Z8541 Personal history of malignant neoplasm of cervix uteri: Secondary | ICD-10-CM

## 2013-09-19 DIAGNOSIS — J189 Pneumonia, unspecified organism: Secondary | ICD-10-CM | POA: Diagnosis present

## 2013-09-19 DIAGNOSIS — Z9981 Dependence on supplemental oxygen: Secondary | ICD-10-CM

## 2013-09-19 DIAGNOSIS — I5033 Acute on chronic diastolic (congestive) heart failure: Principal | ICD-10-CM | POA: Diagnosis present

## 2013-09-19 DIAGNOSIS — E7800 Pure hypercholesterolemia, unspecified: Secondary | ICD-10-CM | POA: Diagnosis present

## 2013-09-19 DIAGNOSIS — E039 Hypothyroidism, unspecified: Secondary | ICD-10-CM | POA: Diagnosis present

## 2013-09-19 DIAGNOSIS — F411 Generalized anxiety disorder: Secondary | ICD-10-CM | POA: Diagnosis present

## 2013-09-19 DIAGNOSIS — E870 Hyperosmolality and hypernatremia: Secondary | ICD-10-CM

## 2013-09-19 DIAGNOSIS — R5381 Other malaise: Secondary | ICD-10-CM | POA: Diagnosis present

## 2013-09-19 DIAGNOSIS — J4489 Other specified chronic obstructive pulmonary disease: Secondary | ICD-10-CM | POA: Diagnosis present

## 2013-09-19 DIAGNOSIS — F17200 Nicotine dependence, unspecified, uncomplicated: Secondary | ICD-10-CM

## 2013-09-19 DIAGNOSIS — E781 Pure hyperglyceridemia: Secondary | ICD-10-CM

## 2013-09-19 DIAGNOSIS — J984 Other disorders of lung: Secondary | ICD-10-CM

## 2013-09-19 DIAGNOSIS — I5032 Chronic diastolic (congestive) heart failure: Secondary | ICD-10-CM | POA: Diagnosis present

## 2013-09-19 DIAGNOSIS — Z9079 Acquired absence of other genital organ(s): Secondary | ICD-10-CM

## 2013-09-19 DIAGNOSIS — R0689 Other abnormalities of breathing: Secondary | ICD-10-CM

## 2013-09-19 DIAGNOSIS — J441 Chronic obstructive pulmonary disease with (acute) exacerbation: Secondary | ICD-10-CM

## 2013-09-19 DIAGNOSIS — N182 Chronic kidney disease, stage 2 (mild): Secondary | ICD-10-CM | POA: Diagnosis present

## 2013-09-19 DIAGNOSIS — K219 Gastro-esophageal reflux disease without esophagitis: Secondary | ICD-10-CM

## 2013-09-19 DIAGNOSIS — M129 Arthropathy, unspecified: Secondary | ICD-10-CM | POA: Diagnosis present

## 2013-09-19 DIAGNOSIS — B029 Zoster without complications: Secondary | ICD-10-CM

## 2013-09-19 HISTORY — DX: Heart failure, unspecified: I50.9

## 2013-09-19 HISTORY — DX: Fibromyalgia: M79.7

## 2013-09-19 HISTORY — DX: Dependence on supplemental oxygen: Z99.81

## 2013-09-19 LAB — POCT I-STAT 3, ART BLOOD GAS (G3+)
ACID-BASE EXCESS: 10 mmol/L — AB (ref 0.0–2.0)
Bicarbonate: 35.2 mEq/L — ABNORMAL HIGH (ref 20.0–24.0)
O2 SAT: 94 %
TCO2: 37 mmol/L (ref 0–100)
pCO2 arterial: 48.1 mmHg — ABNORMAL HIGH (ref 35.0–45.0)
pH, Arterial: 7.472 — ABNORMAL HIGH (ref 7.350–7.450)
pO2, Arterial: 69 mmHg — ABNORMAL LOW (ref 80.0–100.0)

## 2013-09-19 LAB — CBC
HCT: 35.7 % — ABNORMAL LOW (ref 36.0–46.0)
HEMOGLOBIN: 11.5 g/dL — AB (ref 12.0–15.0)
MCH: 31.4 pg (ref 26.0–34.0)
MCHC: 32.2 g/dL (ref 30.0–36.0)
MCV: 97.5 fL (ref 78.0–100.0)
Platelets: 374 10*3/uL (ref 150–400)
RBC: 3.66 MIL/uL — AB (ref 3.87–5.11)
RDW: 17.8 % — ABNORMAL HIGH (ref 11.5–15.5)
WBC: 13.8 10*3/uL — AB (ref 4.0–10.5)

## 2013-09-19 LAB — BASIC METABOLIC PANEL
BUN: 31 mg/dL — ABNORMAL HIGH (ref 6–23)
CO2: 30 meq/L (ref 19–32)
CREATININE: 1.44 mg/dL — AB (ref 0.50–1.10)
Calcium: 9.4 mg/dL (ref 8.4–10.5)
Chloride: 96 mEq/L (ref 96–112)
GFR calc Af Amer: 48 mL/min — ABNORMAL LOW (ref >90)
GFR, EST NON AFRICAN AMERICAN: 42 mL/min — AB (ref >90)
GLUCOSE: 103 mg/dL — AB (ref 70–99)
Potassium: 4.6 mEq/L (ref 3.7–5.3)
SODIUM: 140 meq/L (ref 137–147)

## 2013-09-19 LAB — CBC WITH DIFFERENTIAL/PLATELET
BASOS ABS: 0.1 10*3/uL (ref 0.0–0.1)
BASOS PCT: 1 % (ref 0–1)
EOS PCT: 0 % (ref 0–5)
Eosinophils Absolute: 0 10*3/uL (ref 0.0–0.7)
HCT: 35.4 % — ABNORMAL LOW (ref 36.0–46.0)
HEMOGLOBIN: 11.3 g/dL — AB (ref 12.0–15.0)
LYMPHS ABS: 1.5 10*3/uL (ref 0.7–4.0)
LYMPHS PCT: 10 % — AB (ref 12–46)
MCH: 31.2 pg (ref 26.0–34.0)
MCHC: 31.9 g/dL (ref 30.0–36.0)
MCV: 97.8 fL (ref 78.0–100.0)
MONO ABS: 1.2 10*3/uL — AB (ref 0.1–1.0)
Monocytes Relative: 8 % (ref 3–12)
Neutro Abs: 11.7 10*3/uL — ABNORMAL HIGH (ref 1.7–7.7)
Neutrophils Relative %: 81 % — ABNORMAL HIGH (ref 43–77)
Platelets: 346 10*3/uL (ref 150–400)
RBC: 3.62 MIL/uL — AB (ref 3.87–5.11)
RDW: 17.8 % — ABNORMAL HIGH (ref 11.5–15.5)
WBC: 14.5 10*3/uL — ABNORMAL HIGH (ref 4.0–10.5)
nRBC: 1 /100 WBC — ABNORMAL HIGH

## 2013-09-19 LAB — HEPATIC FUNCTION PANEL
ALK PHOS: 118 U/L — AB (ref 39–117)
ALT: 17 U/L (ref 0–35)
AST: 18 U/L (ref 0–37)
Albumin: 3.2 g/dL — ABNORMAL LOW (ref 3.5–5.2)
BILIRUBIN TOTAL: 0.2 mg/dL — AB (ref 0.3–1.2)
Total Protein: 7.5 g/dL (ref 6.0–8.3)

## 2013-09-19 LAB — TROPONIN I
Troponin I: <0.3 ng/mL (ref <0.30)
Troponin I: <0.3 ng/mL (ref <0.30)
Troponin I: <0.3 ng/mL (ref <0.30)

## 2013-09-19 LAB — D-DIMER, QUANTITATIVE: D-Dimer, Quant: 0.92 ug/mL-FEU — ABNORMAL HIGH (ref 0.00–0.48)

## 2013-09-19 LAB — INFLUENZA PANEL BY PCR (TYPE A & B)
H1N1 flu by pcr: NOT DETECTED
Influenza A By PCR: NEGATIVE
Influenza B By PCR: NEGATIVE

## 2013-09-19 LAB — TSH: TSH: 0.623 u[IU]/mL (ref 0.350–4.500)

## 2013-09-19 LAB — PROTIME-INR
INR: 0.82 (ref 0.00–1.49)
Prothrombin Time: 11.2 seconds — ABNORMAL LOW (ref 11.6–15.2)

## 2013-09-19 LAB — PRO B NATRIURETIC PEPTIDE: Pro B Natriuretic peptide (BNP): 506.1 pg/mL — ABNORMAL HIGH (ref 0–125)

## 2013-09-19 LAB — MRSA PCR SCREENING: MRSA by PCR: NEGATIVE

## 2013-09-19 MED ORDER — MOMETASONE FURO-FORMOTEROL FUM 100-5 MCG/ACT IN AERO
2.0000 | INHALATION_SPRAY | Freq: Two times a day (BID) | RESPIRATORY_TRACT | Status: DC
  Administered 2013-09-19 – 2013-09-22 (×5): 2 via RESPIRATORY_TRACT
  Filled 2013-09-19 (×2): qty 8.8

## 2013-09-19 MED ORDER — FUROSEMIDE 10 MG/ML IJ SOLN
20.0000 mg | Freq: Two times a day (BID) | INTRAMUSCULAR | Status: DC
  Administered 2013-09-19 – 2013-09-20 (×2): 20 mg via INTRAVENOUS
  Filled 2013-09-19 (×2): qty 2

## 2013-09-19 MED ORDER — ALBUTEROL SULFATE (2.5 MG/3ML) 0.083% IN NEBU
5.0000 mg | INHALATION_SOLUTION | Freq: Once | RESPIRATORY_TRACT | Status: AC
  Administered 2013-09-19: 5 mg via RESPIRATORY_TRACT
  Filled 2013-09-19: qty 6

## 2013-09-19 MED ORDER — ALPRAZOLAM 0.5 MG PO TABS
0.5000 mg | ORAL_TABLET | Freq: Two times a day (BID) | ORAL | Status: DC | PRN
  Administered 2013-09-19 – 2013-09-22 (×5): 0.5 mg via ORAL
  Filled 2013-09-19 (×5): qty 1

## 2013-09-19 MED ORDER — IPRATROPIUM BROMIDE 0.02 % IN SOLN
0.5000 mg | Freq: Once | RESPIRATORY_TRACT | Status: AC
  Administered 2013-09-19: 0.5 mg via RESPIRATORY_TRACT
  Filled 2013-09-19: qty 2.5

## 2013-09-19 MED ORDER — METHYLPREDNISOLONE SODIUM SUCC 125 MG IJ SOLR
60.0000 mg | Freq: Four times a day (QID) | INTRAMUSCULAR | Status: DC
  Administered 2013-09-19 – 2013-09-21 (×6): 60 mg via INTRAVENOUS
  Administered 2013-09-21: 05:00:00 via INTRAVENOUS
  Administered 2013-09-21 – 2013-09-22 (×4): 60 mg via INTRAVENOUS
  Filled 2013-09-19: qty 0.96
  Filled 2013-09-19: qty 2
  Filled 2013-09-19 (×5): qty 0.96
  Filled 2013-09-19: qty 2
  Filled 2013-09-19: qty 0.96
  Filled 2013-09-19: qty 2
  Filled 2013-09-19 (×5): qty 0.96

## 2013-09-19 MED ORDER — ALBUTEROL (5 MG/ML) CONTINUOUS INHALATION SOLN
10.0000 mg/h | INHALATION_SOLUTION | Freq: Once | RESPIRATORY_TRACT | Status: AC
  Administered 2013-09-19: 10 mg/h via RESPIRATORY_TRACT
  Filled 2013-09-19: qty 20

## 2013-09-19 MED ORDER — IPRATROPIUM BROMIDE 0.02 % IN SOLN
0.5000 mg | Freq: Four times a day (QID) | RESPIRATORY_TRACT | Status: DC
Start: 2013-09-19 — End: 2013-09-20
  Administered 2013-09-19 – 2013-09-20 (×3): 0.5 mg via RESPIRATORY_TRACT
  Filled 2013-09-19 (×3): qty 2.5

## 2013-09-19 MED ORDER — METHYLPREDNISOLONE SODIUM SUCC 125 MG IJ SOLR
125.0000 mg | Freq: Once | INTRAMUSCULAR | Status: AC
  Administered 2013-09-19: 125 mg via INTRAVENOUS
  Filled 2013-09-19: qty 2

## 2013-09-19 MED ORDER — ONDANSETRON HCL 4 MG/2ML IJ SOLN: 4.0000 mg | Freq: Four times a day (QID) | INTRAMUSCULAR | Status: DC | PRN

## 2013-09-19 MED ORDER — OXYCODONE HCL 10 MG PO TABS: 5.0000 mg | ORAL_TABLET | Freq: Three times a day (TID) | ORAL | Status: DC | PRN

## 2013-09-19 MED ORDER — TECHNETIUM TO 99M ALBUMIN AGGREGATED
6.0000 | Freq: Once | INTRAVENOUS | Status: AC | PRN
  Administered 2013-09-19: 6 via INTRAVENOUS

## 2013-09-19 MED ORDER — FUROSEMIDE 10 MG/ML IJ SOLN
20.0000 mg | Freq: Once | INTRAMUSCULAR | Status: AC
  Administered 2013-09-19: 20 mg via INTRAVENOUS
  Filled 2013-09-19: qty 2

## 2013-09-19 MED ORDER — IPRATROPIUM BROMIDE 0.02 % IN SOLN
1.0000 mg | Freq: Once | RESPIRATORY_TRACT | Status: AC
  Administered 2013-09-19: 1 mg via RESPIRATORY_TRACT
  Filled 2013-09-19: qty 5

## 2013-09-19 MED ORDER — ACETAMINOPHEN 325 MG PO TABS: 650.0000 mg | ORAL_TABLET | Freq: Four times a day (QID) | ORAL | Status: DC | PRN

## 2013-09-19 MED ORDER — SODIUM CHLORIDE 0.9 % IV SOLN: INTRAVENOUS | Status: DC

## 2013-09-19 MED ORDER — LEVOTHYROXINE SODIUM 150 MCG PO TABS
150.0000 ug | ORAL_TABLET | Freq: Every day | ORAL | Status: DC
  Administered 2013-09-20 – 2013-09-22 (×3): 150 ug via ORAL
  Filled 2013-09-19 (×6): qty 1

## 2013-09-19 MED ORDER — VANCOMYCIN HCL IN DEXTROSE 750-5 MG/150ML-% IV SOLN
750.0000 mg | Freq: Two times a day (BID) | INTRAVENOUS | Status: DC
  Administered 2013-09-19 – 2013-09-21 (×5): 750 mg via INTRAVENOUS
  Filled 2013-09-19 (×8): qty 150

## 2013-09-19 MED ORDER — SODIUM CHLORIDE 0.9 % IJ SOLN
3.0000 mL | Freq: Two times a day (BID) | INTRAMUSCULAR | Status: DC
  Administered 2013-09-20 – 2013-09-22 (×2): 3 mL via INTRAVENOUS

## 2013-09-19 MED ORDER — TECHNETIUM TC 99M DIETHYLENETRIAME-PENTAACETIC ACID: 40.0000 | Freq: Once | INTRAVENOUS | Status: AC | PRN

## 2013-09-19 MED ORDER — FUROSEMIDE 10 MG/ML IJ SOLN: 40.0000 mg | Freq: Once | INTRAMUSCULAR | Status: DC

## 2013-09-19 MED ORDER — LEVALBUTEROL HCL 0.63 MG/3ML IN NEBU
0.6300 mg | INHALATION_SOLUTION | Freq: Four times a day (QID) | RESPIRATORY_TRACT | Status: DC
  Administered 2013-09-19 – 2013-09-20 (×3): 0.63 mg via RESPIRATORY_TRACT
  Filled 2013-09-19 (×10): qty 3

## 2013-09-19 MED ORDER — PANTOPRAZOLE SODIUM 40 MG PO TBEC
40.0000 mg | DELAYED_RELEASE_TABLET | Freq: Two times a day (BID) | ORAL | Status: DC
  Administered 2013-09-19 – 2013-09-22 (×6): 40 mg via ORAL
  Filled 2013-09-19 (×6): qty 1

## 2013-09-19 MED ORDER — PIPERACILLIN-TAZOBACTAM 3.375 G IVPB
3.3750 g | Freq: Three times a day (TID) | INTRAVENOUS | Status: DC
  Administered 2013-09-20 – 2013-09-22 (×8): 3.375 g via INTRAVENOUS
  Filled 2013-09-19 (×10): qty 50

## 2013-09-19 MED ORDER — DILTIAZEM HCL ER BEADS 300 MG PO CP24
300.0000 mg | ORAL_CAPSULE | Freq: Every day | ORAL | Status: DC
  Administered 2013-09-20 – 2013-09-22 (×3): 300 mg via ORAL
  Filled 2013-09-19 (×3): qty 1

## 2013-09-19 MED ORDER — ENOXAPARIN SODIUM 30 MG/0.3ML ~~LOC~~ SOLN
30.0000 mg | SUBCUTANEOUS | Status: DC
  Administered 2013-09-19: 30 mg via SUBCUTANEOUS
  Filled 2013-09-19 (×3): qty 0.3

## 2013-09-19 MED ORDER — SODIUM CHLORIDE 0.9 % IJ SOLN: 3.0000 mL | INTRAMUSCULAR | Status: DC | PRN

## 2013-09-19 MED ORDER — ACETAMINOPHEN 650 MG RE SUPP: 650.0000 mg | Freq: Four times a day (QID) | RECTAL | Status: DC | PRN

## 2013-09-19 MED ORDER — ONDANSETRON HCL 4 MG PO TABS: 4.0000 mg | ORAL_TABLET | Freq: Four times a day (QID) | ORAL | Status: DC | PRN

## 2013-09-19 MED ORDER — OXYCODONE HCL 5 MG PO TABS
5.0000 mg | ORAL_TABLET | Freq: Three times a day (TID) | ORAL | Status: DC | PRN
  Administered 2013-09-19 – 2013-09-22 (×7): 5 mg via ORAL
  Filled 2013-09-19 (×7): qty 1

## 2013-09-19 MED ORDER — SODIUM CHLORIDE 0.9 % IV SOLN
250.0000 mL | INTRAVENOUS | Status: DC | PRN
Start: 2013-09-19 — End: 2013-09-22
  Administered 2013-09-20: 250 mL via INTRAVENOUS

## 2013-09-19 NOTE — Progress Notes (Signed)
ANTIBIOTIC CONSULT NOTE - INITIAL  Pharmacy Consult for vancomycin Indication: pneumonia  Allergies  Allergen Reactions  . Tape Itching and Rash    Patient Measurements: Height: _0  (157.5 cm) Weight: 153 lb 6.4 oz (69.582 kg) IBW/kg (Calculated) : 50.1   Vital Signs: Temp: 98.9 F (37.2 C) (01/26 1815) Temp src: Oral (01/26 1815) BP: 111/74 mmHg (01/26 1815) Pulse Rate: 110 (01/26 1815) Intake/Output from previous day:   Intake/Output from this shift: Total I/O In: -  Out: 400 [Urine:400]  Labs:  Recent Labs  09/19/13 1310 09/19/13 1724  WBC 14.5* 13.8*  HGB 11.3* 11.5*  PLT 346 374  CREATININE 1.44*  --    Estimated Creatinine Clearance: 42.7 ml/min (by C-G formula based on Cr of 1.44). No results found for this basename: VANCOTROUGH, Corlis Leak, VANCORANDOM, [place], [place], [place], [place], TOBRAPEAK, TOBRARND, AMIKACINPEAK, AMIKACINTROU, AMIKACIN,  in the last 72 hours   Microbiology: Recent Results (from the past 720 hour(s))  CULTURE, RESPIRATORY (NON-EXPECTORATED)     Status: None   Collection Time    08/20/13  8:22 PM      Result Value Range Status   Specimen Description TRACHEAL ASPIRATE   Final   Special Requests NONE   Final   Gram Stain     Final   Value: MODERATE WBC PRESENT,BOTH PMN AND MONONUCLEAR     RARE SQUAMOUS EPITHELIAL CELLS PRESENT     RARE YEAST     Performed at [facility]   Culture     Final   Value: FEW CANDIDA ALBICANS     Performed at [facility]   Report Status 08/22/2013 FINAL   Final  MRSA PCR SCREENING     Status: None   Collection Time    08/20/13 10:59 PM      Result Value Range Status   MRSA by PCR NEGATIVE  NEGATIVE Final   Comment:            The GeneXpert MRSA Assay (FDA     approved for NASAL specimens     only), is one component of a     comprehensive MRSA colonization     surveillance program. It is not     intended to diagnose MRSA     infection nor to guide or   monitor treatment for     MRSA infections.  CULTURE, BLOOD (ROUTINE X 2)     Status: None   Collection Time    08/21/13 12:10 AM      Result Value Range Status   Specimen Description BLOOD LEFT HAND   Final   Special Requests BOTTLES DRAWN AEROBIC ONLY 4CC   Final   Culture  Setup Time     Final   Value: 08/21/2013 14:18     Performed at [facility]   Culture     Final   Value: NO GROWTH 5 DAYS     Performed at [facility]   Report Status 08/27/2013 FINAL   Final  CULTURE, BLOOD (ROUTINE X 2)     Status: None   Collection Time    08/21/13 12:15 AM      Result Value Range Status   Specimen Description BLOOD RIGHT ANTECUBITAL   Final   Special Requests BOTTLES DRAWN AEROBIC ONLY 10CC   Final   Culture  Setup Time     Final   Value: 08/21/2013 14:19     Performed at [facility]  Final   Value: NO GROWTH 5 DAYS     Performed at [facility]   Report Status 08/27/2013 FINAL   Final  RESPIRATORY VIRUS PANEL     Status: Abnormal   Collection Time    08/21/13  1:19 AM      Result Value Range Status   Source - RVPAN NASAL SWAB   Corrected   Comment: CORRECTED ON 12/28 AT 2319: PREVIOUSLY REPORTED AS NASAL SWAB   Respiratory Syncytial Virus A NOT DETECTED   Final   Respiratory Syncytial Virus B NOT DETECTED   Final   Influenza A DETECTED (*)  Final   Influenza B NOT DETECTED   Final   Parainfluenza 1 NOT DETECTED   Final   Parainfluenza 2 NOT DETECTED   Final   Parainfluenza 3 NOT DETECTED   Final   Metapneumovirus NOT DETECTED   Final   Rhinovirus NOT DETECTED   Final   Adenovirus NOT DETECTED   Final   Influenza A H1 NOT DETECTED   Final   Influenza A H3 DETECTED (*)  Final   Comment: (NOTE)           Normal Reference Range for each Analyte: NOT DETECTED     Testing performed using the Luminex xTAG Respiratory Viral Panel test     kit.     This test was developed and its performance characteristics determined     by [employer]. It has not been cleared or approved by the [place]     Food and Drug Administration. This test is used for clinical purposes.     It should not be regarded as investigational or for research. This     laboratory is certified under the [place] (CLIA) as qualified to perform high complexity     clinical laboratory testing.     Performed at [place]     Status: None   Collection Time    09/02/13  2:58 PM      Result Value Range Status   Specimen Description URINE, RANDOM   Final   Special Requests NONE   Final   Culture  Setup Time     Final   Value: 09/02/2013 20:23     Performed at [place]     Final   Value: NO GROWTH     Performed at [facility]   Culture     Final   Value: NO GROWTH     Performed at [facility]   Report Status 09/03/2013 FINAL   Final  CULTURE, BLOOD (ROUTINE X 2)     Status: None   Collection Time    09/02/13  3:00 PM      Result Value Range Status   Specimen Description BLOOD HAND RIGHT   Final   Special Requests BOTTLES DRAWN AEROBIC AND ANAEROBIC 10CC   Final   Culture  Setup Time     Final   Value: 09/02/2013 20:25     Performed at [facility]   Culture     Final   Value: NO GROWTH 5 DAYS     Performed at [facility]   Report Status 09/08/2013 FINAL   Final  CULTURE, BLOOD (ROUTINE X 2)     Status: None   Collection Time    09/02/13  3:25 PM      Result  Value Range Status   Specimen Description BLOOD ARM RIGHT   Final   Special Requests BOTTLES DRAWN AEROBIC AND ANAEROBIC 10CC   Final   Culture  Setup Time     Final   Value: 09/02/2013 20:25     Performed at [facility]   Culture     Final   Value: NO GROWTH 5 DAYS     Performed at [facility]   Report Status 09/08/2013 FINAL   Final  CULTURE, RESPIRATORY (NON-EXPECTORATED)     Status: None   Collection Time    09/02/13  4:02 PM       Result Value Range Status   Specimen Description TRACHEAL ASPIRATE   Final   Special Requests NONE   Final   Gram Stain     Final   Value: RARE WBC PRESENT, PREDOMINANTLY MONONUCLEAR     NO SQUAMOUS EPITHELIAL CELLS SEEN     ABUNDANT GRAM NEGATIVE RODS     MODERATE YEAST     Performed at [facility]   Culture     Final   Value: ABUNDANT BURKHOLDERIA CEPACIA     Performed at [facility]   Report Status 09/06/2013 FINAL   Final   Organism ID, Bacteria BURKHOLDERIA CEPACIA   Final  URINE CULTURE     Status: None   Collection Time    09/06/13  9:47 PM      Result Value Range Status   Specimen Description URINE, CATHETERIZED   Final   Special Requests NONE   Final   Culture  Setup Time     Final   Value: 09/07/2013 02:49     Performed at [place]     Final   Value: NO GROWTH     Performed at [facility]   Culture     Final   Value: NO GROWTH     Performed at [facility]   Report Status 09/07/2013 FINAL   Final    Assessment: 58 YOF with recent COPD exacerbation who is being readmitted after discharge on 1/17 with similar complaints of SOB. She was started on empiric Zosyn this afternoon, now to also start vancomycin. SCr 1.15m/dL and est CrCl ~472mmin. WBC 13.8, currently afebrile.  Goal of Therapy:  Vancomycin trough level 15-20 mcg/ml  Plan:  1. Vancomycin 75053mV q12h 2. Follow renal function, LOT, clinical progression and trough at SS 3. Continue Zosyn 3.375gm IV q8h extended interval- pharmacy signed off from this consult as no dose adjustments or levels are required. Will follow along for any possible changes to Zosyn therapy  Corrin Sieling D. Kirby Argueta, PharmD, BCPS Clinical Pharmacist Pager: [phone] 6:37 PM

## 2013-09-19 NOTE — ED Notes (Signed)
Pt. off the floor-ABG delayed, DR. Abrol-paged for clarification on room air order, pt. Chronic 02 @ home 4lpm n/c.

## 2013-09-19 NOTE — ED Notes (Signed)
Was just d/c on sat w/ chf  and was told to come if she swelled again and she has gained 5 # since Friday pt is on 4 l o2 and she is still sob

## 2013-09-19 NOTE — H&P (Signed)
[facility] History and Physical  [name] XBW:[id number] DOB: 09-05-1960 DOA: 09/19/2013  Referring physician:  PCP: [name], NP   Chief Complaint: Shortness of breath HPI:  53 year old female with a history of recent COPD exacerbation discharged on 1/17, ongoing tobacco abuse, hypercarbic respiratory failure requiring BiPAP and subsequently intubation, also diagnosed with influenza A. during her last admission, treated with Tamiflu and antibiotics, chronically on 2-4 L of oxygen, discharged on 1/17 who returned today with similar complaints. Patient also appears to be acutely delirious with minimal improvement duoneb and Solu-Medrol. WBC count is increased to 14.5, from 8.5 on the day of discharge. Chest x-ray shows interstitial edema.  Patient presented today, and was tachypneic and desaturated to 85% on 4 L of oxygen which is her baseline. She appear to be short of breath and unable to speak in full sentences. Apparently she is also being about 5 pounds in the last week or so., Recent 2-D echo showed grade 1 diastolic dysfunction.       Review of Systems: negative for the following  Constitutional: As in history of present illness HEENT: Denies photophobia, eye pain, redness, hearing loss, ear pain, congestion, sore throat, rhinorrhea, sneezing, mouth sores, trouble swallowing, neck pain, neck stiffness and tinnitus.  Respiratory: As in history of present illness Cardiovascular: Denies chest pain, palpitations and leg swelling.  Gastrointestinal: Denies nausea, vomiting, abdominal pain, diarrhea, constipation, blood in stool and abdominal distention.  Genitourinary: Denies dysuria, urgency, frequency, hematuria, flank pain and difficulty urinating.  Musculoskeletal: Denies myalgias, back pain, joint swelling, arthralgias and gait problem.  Skin: Denies pallor, rash and wound.  Neurological: Denies dizziness, seizures, syncope, weakness, light-headedness, numbness and  headaches.  Hematological: Denies adenopathy. Easy bruising, personal or family bleeding history  Psychiatric/Behavioral: Denies suicidal ideation, mood changes, confusion, nervousness, sleep disturbance and agitation       Past Medical History  Diagnosis Date  . Right-sided heart failure   . COPD (chronic obstructive pulmonary disease)   . Emphysema   . Seizures   . Thyroid disease   . Depression   . Emphysema   . Arthritis     back and legs  . Blood transfusion without reported diagnosis   . High cholesterol   . Anxiety   . Nerves   . Panic attack   . Thyroid disorder   . Hypothyroidism   . Pneumonia   . CHF (congestive heart failure)   . On home O2     4L [place] chronic  . Fibromyalgia      History reviewed. No pertinent past surgical history.    Social History:  reports that she quit smoking about 6 weeks ago. She does not have any smokeless tobacco history on file. She reports that she does not drink alcohol or use illicit drugs.    Allergies  Allergen Reactions  . Tape Itching and Rash    No family history on file.   Prior to Admission medications   Medication Sig Start Date End Date Taking? Authorizing Provider  ALPRAZolam Duanne Moron) 0.5 MG tablet Take 0.5 mg by mouth 2 (two) times daily as needed for anxiety.   Yes Historical Provider, MD  Calcium Carb-Cholecalciferol (CALCIUM 600 + D PO) Take 1 tablet by mouth 2 (two) times daily.   Yes Historical Provider, MD  diltiazem (TIAZAC) 300 MG 24 hr capsule Take 300 mg by mouth daily.   Yes Historical Provider, MD  DULoxetine (CYMBALTA) 60 MG capsule Take 60 mg by mouth 2 (two)  times daily.   Yes Historical Provider, MD  ferrous sulfate 325 (65 FE) MG EC tablet Take 325 mg by mouth daily.   Yes Historical Provider, MD  Fluticasone-Salmeterol (ADVAIR) 250-50 MCG/DOSE AEPB Inhale 1 puff into the lungs every 12 (twelve) hours.   Yes Historical Provider, MD  furosemide (LASIX) 40 MG tablet Take 40 mg by mouth daily.    Yes Historical Provider, MD  levofloxacin (LEVAQUIN) 750 MG tablet Take 1 tablet (750 mg total) by mouth daily. 09/10/13  Yes Debbe Odea, MD  levothyroxine (SYNTHROID, LEVOTHROID) 150 MCG tablet Take 150 mcg by mouth daily.   Yes Historical Provider, MD  Linaclotide Rolan Lipa) 145 MCG CAPS capsule Take 145 mcg by mouth daily.   Yes Historical Provider, MD  loratadine (CLARITIN) 10 MG tablet Take 10 mg by mouth at bedtime.   Yes Historical Provider, MD  Oxycodone HCl 10 MG TABS Take 0.5 tablets (5 mg total) by mouth every 6 (six) hours as needed (for pain). 08/26/13  Yes Donita Brooks, NP  pantoprazole (PROTONIX) 40 MG tablet Take 1 tablet (40 mg total) by mouth 2 (two) times daily. 06/27/13  Yes Tiffany L Reed, DO  potassium chloride SA (K-DUR,KLOR-CON) 20 MEQ tablet Take 20 mEq by mouth daily.   Yes Historical Provider, MD  pregabalin (LYRICA) 100 MG capsule Take 100 mg by mouth 3 (three) times daily.   Yes Historical Provider, MD  tiotropium (SPIRIVA) 18 MCG inhalation capsule Place 18 mcg into inhaler and inhale daily.   Yes Historical Provider, MD  traZODone (DESYREL) 150 MG tablet Take 150 mg by mouth at bedtime.   Yes Historical Provider, MD  vitamin B-12 (CYANOCOBALAMIN) 1000 MCG tablet Take 5,000 mcg by mouth daily.   Yes Historical Provider, MD     Physical Exam: Filed Vitals:   09/19/13 1315 09/19/13 1415 09/19/13 1426 09/19/13 1450  BP: 102/73  109/66   Pulse: 114 108 110   Temp:   98.3 F (36.8 C)   TempSrc:   Oral   Resp: 15 17 17    Height:      Weight:      SpO2: 96% 96% 94% 95%     Constitutional: Vital signs reviewed. Patient is a well-developed and well-nourished in no acute distress and cooperative with exam. Confused  Head: Normocephalic and atraumatic  Ear: TM normal bilaterally  Mouth: no erythema or exudates, MMM  Eyes: PERRL, EOMI, conjunctivae normal, No scleral icterus.  Neck: Supple, Trachea midline normal ROM, No JVD, mass, thyromegaly, or carotid bruit  present.  Cardiovascular: RRR, S1 normal, S2 normal, no MRG, pulses symmetric and intact bilaterally  Pulmonary/Chest: CTAB, no wheezes, rales, or rhonchi  Abdominal: Soft. Non-tender, non-distended, bowel sounds are normal, no masses, organomegaly, or guarding present.  GU: no CVA tenderness Musculoskeletal: No joint deformities, erythema, or stiffness, ROM full and no nontender Ext: no edema and no cyanosis, pulses palpable bilaterally (DP and PT)  Hematology: no cervical, inginal, or axillary adenopathy.  Neurological: Confused,, Strenght is normal and symmetric bilaterally, cranial nerve II-XII are grossly intact, no focal motor deficit, sensory intact to light touch bilaterally.  Skin: Warm, dry and intact. No rash, cyanosis, or clubbing.  Psychiatric: Normal mood and affect. speech and behavior is normal. Judgment and thought content normal. Cognition and memory are normal.       Labs on Admission:    Basic Metabolic Panel:  Recent Labs Lab 09/19/13 1310  NA 140  K 4.6  CL 96  CO2 30  GLUCOSE 103*  BUN 31*  CREATININE 1.44*  CALCIUM 9.4   Liver Function Tests: No results found for this basename: AST, ALT, ALKPHOS, BILITOT, PROT, ALBUMIN,  in the last 168 hours No results found for this basename: LIPASE, AMYLASE,  in the last 168 hours No results found for this basename: AMMONIA,  in the last 168 hours CBC:  Recent Labs Lab 09/19/13 1310  WBC 14.5*  NEUTROABS 11.7*  HGB 11.3*  HCT 35.4*  MCV 97.8  PLT 346   Cardiac Enzymes:  Recent Labs Lab 09/19/13 1310  TROPONINI <0.30    BNP (last 3 results)  Recent Labs  08/20/13 1926 09/02/13 1145 09/19/13 1310  PROBNP 736.3* 622.9* 506.1*      CBG: No results found for this basename: GLUCAP,  in the last 168 hours  Radiological Exams on Admission: Dg Chest 2 View  09/19/2013   CLINICAL DATA:  Short of breath  EXAM: CHEST  2 VIEW  COMPARISON:  DG CHEST 1V PORT dated 09/05/2013  FINDINGS: Normal  cardiac silhouette. There is a fine interstitial pattern representing interstitial edema. This is similar to comparison exam. This mild basilar atelectasis. No pleural fluid. Lungs are mildly hyperinflated  IMPRESSION: Interstitial edema pattern similar prior.  Mild basilar atelectasis.   Electronically Signed   By: Suzy Bouchard M.D.   On: 09/19/2013 15:14    EKG: Independently reviewed. Sinus tachycardia with a rate of 120  Assessment/Plan Active Problems:   COPD exacerbation  Shortness of breath Multifactorial. 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 The patient on broad-spectrum antibiotics Nebulizer treatment , Given reduced EF of 40-45% and interstitial edema on chest x-ray we'll start the patient on Lasix IV Trial of BiPAP tonight given recent intubation, keep the patient n.p.o. Admit the patient to step down Given her current hospitalization we'll check a d-dimer, patient had a pulmonary nodule in 2012 A CT chest without contrast to further evaluate this, also VQ scan if the d-dimer is elevated to rule out pulmonary embolism    Chronic kidney disease, stage II Creatinine trending up, monitor closely given IV Lasix   Anxiety Continue the alprazolam while the patient is on BiPAP  Shingles Now resolved     Code Status:   full Family Communication: bedside Disposition Plan: admit   Time spent: 70 mins   [facility] Pager [phone]  If 7PM-7AM, please contact night-coverage [email or web address] Password [facility] 09/19/2013, 3:33 PM

## 2013-09-19 NOTE — ED Notes (Signed)
Ambulated pt in hallway with 4L [place]. Pt tachypenia, 02 level decreased to 85%. Pt appears SOB and reports increase in chest tightness. MD made aware.

## 2013-09-19 NOTE — ED Provider Notes (Signed)
CSN: [id number]     Arrival date & time 09/19/13  1228 History   First MD Initiated Contact with Patient 09/19/13 1244     Chief Complaint  Patient presents with  . Shortness of Breath    HPI Pt was seen at 1315.  Per pt, c/o gradual onset and worsening of persistent cough, wheezing and SOB for the past 4 days.  Describes her symptoms as "either my COPD or CHF is acting up."  Has been using home O2 N/C, MDI and nebs without relief. States she has gained "5 lbs" over the past 3 days despite taking her lasix as prescribed. Pt has significant hx of several recent admissions (including intubation) for similar symptoms, dx pneumonia, CHF, HCAP.  Denies CP/palpitations, no back pain, no abd pain, no N/V/D, no fevers, no rash.     Past Medical History  Diagnosis Date  . Right-sided heart failure   . COPD (chronic obstructive pulmonary disease)   . Emphysema   . Seizures   . Thyroid disease   . Depression   . Emphysema   . Arthritis     back and legs  . Blood transfusion without reported diagnosis   . High cholesterol   . Anxiety   . Nerves   . Panic attack   . Thyroid disorder   . Hypothyroidism   . Pneumonia   . CHF (congestive heart failure)   . On home O2     4L N/C chronic  . Fibromyalgia    History reviewed. No pertinent past surgical history.  History  Substance Use Topics  . Smoking status: Former Smoker    Quit date: 08/02/2013  . Smokeless tobacco: Not on file  . Alcohol Use: No    Review of Systems ROS: Statement: All systems negative except as marked or noted in the HPI; Constitutional: Negative for fever and chills. ; ; Eyes: Negative for eye pain, redness and discharge. ; ; ENMT: Negative for ear pain, hoarseness, nasal congestion, sinus pressure and sore throat. ; ; Cardiovascular: Negative for chest pain, palpitations, diaphoresis, +dyspnea and peripheral edema. ; ; Respiratory: +cough, wheezing. Negative for stridor. ; ; Gastrointestinal: Negative for nausea,  vomiting, diarrhea, abdominal pain, blood in stool, hematemesis, jaundice and rectal bleeding. . ; ; Genitourinary: Negative for dysuria, flank pain and hematuria. ; ; Musculoskeletal: Negative for back pain and neck pain. Negative for swelling and trauma.; ; Skin: Negative for pruritus, rash, abrasions, blisters, bruising and skin lesion.; ; Neuro: Negative for headache, lightheadedness and neck stiffness. Negative for weakness, altered level of consciousness , altered mental status, extremity weakness, paresthesias, involuntary movement, seizure and syncope.       Allergies  Tape  Home Medications   Current Outpatient Rx  Name  Route  Sig  Dispense  Refill  . ALPRAZolam (XANAX) 0.5 MG tablet   Oral   Take 0.5 mg by mouth 2 (two) times daily as needed for anxiety.         . Calcium Carb-Cholecalciferol (CALCIUM 600 + D PO)   Oral   Take 1 tablet by mouth 2 (two) times daily.         Marland Kitchen diltiazem (TIAZAC) 300 MG 24 hr capsule   Oral   Take 300 mg by mouth daily.         . DULoxetine (CYMBALTA) 60 MG capsule   Oral   Take 60 mg by mouth 2 (two) times daily.         . ferrous  sulfate 325 (65 FE) MG EC tablet   Oral   Take 325 mg by mouth daily.         . Fluticasone-Salmeterol (ADVAIR) 250-50 MCG/DOSE AEPB   Inhalation   Inhale 1 puff into the lungs every 12 (twelve) hours.         Marland Kitchen levofloxacin (LEVAQUIN) 750 MG tablet   Oral   Take 1 tablet (750 mg total) by mouth daily.   1 tablet   0   . levothyroxine (SYNTHROID, LEVOTHROID) 150 MCG tablet   Oral   Take 150 mcg by mouth daily.         . Linaclotide (LINZESS) 145 MCG CAPS capsule   Oral   Take 145 mcg by mouth daily.         Marland Kitchen loratadine (CLARITIN) 10 MG tablet   Oral   Take 10 mg by mouth at bedtime.         . Oxycodone HCl 10 MG TABS   Oral   Take 0.5 tablets (5 mg total) by mouth every 6 (six) hours as needed (for pain).      0   . pantoprazole (PROTONIX) 40 MG tablet   Oral   Take 1  tablet (40 mg total) by mouth 2 (two) times daily.   60 tablet   2   . pregabalin (LYRICA) 100 MG capsule   Oral   Take 100 mg by mouth 3 (three) times daily.         Marland Kitchen tiotropium (SPIRIVA) 18 MCG inhalation capsule   Inhalation   Place 18 mcg into inhaler and inhale daily.         . traZODone (DESYREL) 150 MG tablet   Oral   Take 150 mg by mouth at bedtime.         . vitamin B-12 (CYANOCOBALAMIN) 1000 MCG tablet   Oral   Take 5,000 mcg by mouth daily.          BP 109/66  Pulse 110  Temp(Src) 98.3 F (36.8 C) (Oral)  Resp 17  Ht 5\' 2"  (1.575 m)  Wt 153 lb 6.4 oz (69.582 kg)  BMI 28.05 kg/m2  SpO2 95% Physical Exam 1320: Physical examination:  Nursing notes reviewed; Vital signs and O2 SAT reviewed;  Constitutional: Well developed, Well nourished, uncomfortable appearing. ; Head:  Normocephalic, atraumatic; Eyes: EOMI, PERRL, No scleral icterus; ENMT: Mouth and pharynx normal, Mucous membranes dry;; Neck: Supple, Full range of motion, No lymphadenopathy; Cardiovascular: Tachycardic rate and rhythm, No gallop; Respiratory: Breath sounds diminished & equal bilaterally, insp/exp wheezes bilat. No audible wheezing. Sitting upright, tachpneic, speaking short sentences; Chest: Nontender, Movement normal; Abdomen: Soft, Nontender, Nondistended, Normal bowel sounds; Genitourinary: No CVA tenderness; Extremities: Pulses normal, No tenderness, +1 pedal edema bilat without calf asymmetry.; Neuro: AA&Ox3, Major CN grossly intact.  Speech clear. No gross focal motor or sensory deficits in extremities.; Skin: Color normal, Warm, Dry.   ED Course  Procedures  EKG Interpretation    Date/Time:  Monday September 19 2013 12:34:43 EST Ventricular Rate:  120 PR Interval:  128 QRS Duration: 64 QT Interval:  296 QTC Calculation: 418 R Axis:   80 Text Interpretation:  Sinus tachycardia Low voltage QRS Baseline wander When compared with ECG of 09/02/2013 No significant change was found  Confirmed by [facility]  MD, Nunzio Cory [phone]) on 09/19/2013 1:54:11 PM            MDM  MDM Reviewed: previous chart, nursing note and vitals Reviewed previous:  labs and ECG Interpretation: labs, ECG and x-ray Total time providing critical care: 30-74 minutes. This excludes time spent performing separately reportable procedures and services. Consults: admitting MD     CRITICAL CARE Performed by: Alfonzo Feller Total critical care time: 45 Critical care time was exclusive of separately billable procedures and treating other patients. Critical care was necessary to treat or prevent imminent or life-threatening deterioration. Critical care was time spent personally by me on the following activities: development of treatment plan with patient and/or surrogate as well as nursing, discussions with consultants, evaluation of patient's response to treatment, examination of patient, obtaining history from patient or surrogate, ordering and performing treatments and interventions, ordering and review of laboratory studies, ordering and review of radiographic studies, pulse oximetry and re-evaluation of patient's condition.   Results for orders placed during the hospital encounter of 09/19/13  CBC WITH DIFFERENTIAL      Result Value Range   WBC 14.5 (*) 4.0 - 10.5 K/uL   RBC 3.62 (*) 3.87 - 5.11 MIL/uL   Hemoglobin 11.3 (*) 12.0 - 15.0 g/dL   HCT 35.4 (*) 36.0 - 46.0 %   MCV 97.8  78.0 - 100.0 fL   MCH 31.2  26.0 - 34.0 pg   MCHC 31.9  30.0 - 36.0 g/dL   RDW 17.8 (*) 11.5 - 15.5 %   Platelets 346  150 - 400 K/uL   Neutrophils Relative % 81 (*) 43 - 77 %   Lymphocytes Relative 10 (*) 12 - 46 %   Monocytes Relative 8  3 - 12 %   Eosinophils Relative 0  0 - 5 %   Basophils Relative 1  0 - 1 %   nRBC 1 (*) 0 /100 WBC   Neutro Abs 11.7 (*) 1.7 - 7.7 K/uL   Lymphs Abs 1.5  0.7 - 4.0 K/uL   Monocytes Absolute 1.2 (*) 0.1 - 1.0 K/uL   Eosinophils Absolute 0.0  0.0 - 0.7 K/uL   Basophils  Absolute 0.1  0.0 - 0.1 K/uL   RBC Morphology POLYCHROMASIA PRESENT     WBC Morphology       Value: MODERATE LEFT SHIFT (>5% METAS AND MYELOS,OCC PRO NOTED)  PRO B NATRIURETIC PEPTIDE      Result Value Range   Pro B Natriuretic peptide (BNP) 506.1 (*) 0 - 125 pg/mL  TROPONIN I      Result Value Range   Troponin I <0.30  <0.30 ng/mL  BASIC METABOLIC PANEL      Result Value Range   Sodium 140  137 - 147 mEq/L   Potassium 4.6  3.7 - 5.3 mEq/L   Chloride 96  96 - 112 mEq/L   CO2 30  19 - 32 mEq/L   Glucose, Bld 103 (*) 70 - 99 mg/dL   BUN 31 (*) 6 - 23 mg/dL   Creatinine, Ser 1.44 (*) 0.50 - 1.10 mg/dL   Calcium 9.4  8.4 - 10.5 mg/dL   GFR calc non Af Amer 42 (*) >90 mL/min   GFR calc Af Amer 48 (*) >90 mL/min   Dg Chest 2 View 09/19/2013   CLINICAL DATA:  Short of breath  EXAM: CHEST  2 VIEW  COMPARISON:  DG CHEST 1V PORT dated 09/05/2013  FINDINGS: Normal cardiac silhouette. There is a fine interstitial pattern representing interstitial edema. This is similar to comparison exam. This mild basilar atelectasis. No pleural fluid. Lungs are mildly hyperinflated  IMPRESSION: Interstitial edema pattern similar prior.  Mild  basilar atelectasis.   Electronically Signed   By: Suzy Bouchard M.D.   On: 09/19/2013 15:14     1330:  On arrival, pt with insp/exp wheezing, sitting upright, tachpneic. Will start short neb while workup progresses.  1430:  Neb completed. States she "feels better" though lungs continue with insp/exp wheezes and pt sitting upright. Sats 94% on her usual O2 4L N/C. Pt ambulated with Sats decreasing to 85% while on her usual O2 N/C, and pt c/o increasing SOB and RR. Will dose IV solumedrol and start hour long neb. Dx and testing d/w pt and family.  Questions answered.  Verb understanding, agreeable to admit.  1445:  T/C to [facility] Dr. Allyson Sabal, case discussed, including:  HPI, pertinent PM/SHx, VS/PE, dx testing, ED course and treatment:  Agreeable to admit, requests to write  temporary orders, obtain tele bed to team 10.         Alfonzo Feller, DO 09/21/13 1721

## 2013-09-19 NOTE — ED Notes (Signed)
[employer] at bedside.

## 2013-09-19 NOTE — ED Notes (Signed)
2H RT notified about pts. ABG results/Bipap orders.

## 2013-09-19 NOTE — Progress Notes (Signed)
Pt arrived from ED on 4 LPM [place], SPO2 92%. Pt is in no distress, no labored breathing or accessory muscle use. ABG CO2 was 48. RT will continue to monitor pt.

## 2013-09-19 NOTE — Progress Notes (Signed)
ANTIBIOTIC CONSULT NOTE  Pharmacy Consult for zosyn  Indication: rule out pneumonia, COPD exacerbation  Allergies  Allergen Reactions  . Tape Itching and Rash    Patient Measurements: Height: 5\' 2"  (157.5 cm) Weight: 153 lb 6.4 oz (69.582 kg) IBW/kg (Calculated) : 50.1   Vital Signs: Temp: 98.3 F (36.8 C) (01/26 1426) Temp src: Oral (01/26 1426) BP: 108/90 mmHg (01/26 1530) Pulse Rate: 102 (01/26 1530) Intake/Output from previous day:   Intake/Output from this shift: Total I/O In: -  Out: 400 [Urine:400]  Labs:  Recent Labs  09/19/13 1310  WBC 14.5*  HGB 11.3*  PLT 346  CREATININE 1.44*   Estimated Creatinine Clearance: 42.7 ml/min (by C-G formula based on Cr of 1.44). No results found for this basename: VANCOTROUGH, Corlis Leak, VANCORANDOM, [place], [place], [place], [place], TOBRAPEAK, TOBRARND, AMIKACINPEAK, AMIKACINTROU, AMIKACIN,  in the last 72 hours   Microbiology: Recent Results (from the past 720 hour(s))  CULTURE, RESPIRATORY (NON-EXPECTORATED)     Status: None   Collection Time    08/20/13  8:22 PM      Result Value Range Status   Specimen Description TRACHEAL ASPIRATE   Final   Special Requests NONE   Final   Gram Stain     Final   Value: MODERATE WBC PRESENT,BOTH PMN AND MONONUCLEAR     RARE SQUAMOUS EPITHELIAL CELLS PRESENT     RARE YEAST     Performed at [facility]   Culture     Final   Value: FEW CANDIDA ALBICANS     Performed at [facility]   Report Status 08/22/2013 FINAL   Final  MRSA PCR SCREENING     Status: None   Collection Time    08/20/13 10:59 PM      Result Value Range Status   MRSA by PCR NEGATIVE  NEGATIVE Final   Comment:            The GeneXpert MRSA Assay (FDA     approved for NASAL specimens     only), is one component of a     comprehensive MRSA colonization     surveillance program. It is not     intended to diagnose MRSA     infection nor to guide or     monitor treatment for      MRSA infections.  CULTURE, BLOOD (ROUTINE X 2)     Status: None   Collection Time    08/21/13 12:10 AM      Result Value Range Status   Specimen Description BLOOD LEFT HAND   Final   Special Requests BOTTLES DRAWN AEROBIC ONLY 4CC   Final   Culture  Setup Time     Final   Value: 08/21/2013 14:18     Performed at [facility]   Culture     Final   Value: NO GROWTH 5 DAYS     Performed at [facility]   Report Status 08/27/2013 FINAL   Final  CULTURE, BLOOD (ROUTINE X 2)     Status: None   Collection Time    08/21/13 12:15 AM      Result Value Range Status   Specimen Description BLOOD RIGHT ANTECUBITAL   Final   Special Requests BOTTLES DRAWN AEROBIC ONLY 10CC   Final   Culture  Setup Time     Final   Value: 08/21/2013 14:19     Performed at [place]     Final  Value: NO GROWTH 5 DAYS     Performed at [facility]   Report Status 08/27/2013 FINAL   Final  RESPIRATORY VIRUS PANEL     Status: Abnormal   Collection Time    08/21/13  1:19 AM      Result Value Range Status   Source - RVPAN NASAL SWAB   Corrected   Comment: CORRECTED ON 12/28 AT 2319: PREVIOUSLY REPORTED AS NASAL SWAB   Respiratory Syncytial Virus A NOT DETECTED   Final   Respiratory Syncytial Virus B NOT DETECTED   Final   Influenza A DETECTED (*)  Final   Influenza B NOT DETECTED   Final   Parainfluenza 1 NOT DETECTED   Final   Parainfluenza 2 NOT DETECTED   Final   Parainfluenza 3 NOT DETECTED   Final   Metapneumovirus NOT DETECTED   Final   Rhinovirus NOT DETECTED   Final   Adenovirus NOT DETECTED   Final   Influenza A H1 NOT DETECTED   Final   Influenza A H3 DETECTED (*)  Final   Comment: (NOTE)           Normal Reference Range for each Analyte: NOT DETECTED     Testing performed using the Luminex xTAG Respiratory Viral Panel test     kit.     This test was developed and its performance characteristics determined     by [facility]. It has not been  cleared or approved by the [place]     Food and Drug Administration. This test is used for clinical purposes.     It should not be regarded as investigational or for research. This     laboratory is certified under the [place] (CLIA) as qualified to perform high complexity     clinical laboratory testing.     Performed at [place]     Status: None   Collection Time    09/02/13  2:58 PM      Result Value Range Status   Specimen Description URINE, RANDOM   Final   Special Requests NONE   Final   Culture  Setup Time     Final   Value: 09/02/2013 20:23     Performed at [place]     Final   Value: NO GROWTH     Performed at [facility]   Culture     Final   Value: NO GROWTH     Performed at [facility]   Report Status 09/03/2013 FINAL   Final  CULTURE, BLOOD (ROUTINE X 2)     Status: None   Collection Time    09/02/13  3:00 PM      Result Value Range Status   Specimen Description BLOOD HAND RIGHT   Final   Special Requests BOTTLES DRAWN AEROBIC AND ANAEROBIC 10CC   Final   Culture  Setup Time     Final   Value: 09/02/2013 20:25     Performed at [facility]   Culture     Final   Value: NO GROWTH 5 DAYS     Performed at [facility]   Report Status 09/08/2013 FINAL   Final  CULTURE, BLOOD (ROUTINE X 2)     Status: None   Collection Time    09/02/13  3:25 PM      Result Value Range Status  Specimen Description BLOOD ARM RIGHT   Final   Special Requests BOTTLES DRAWN AEROBIC AND ANAEROBIC 10CC   Final   Culture  Setup Time     Final   Value: 09/02/2013 20:25     Performed at [facility]   Culture     Final   Value: NO GROWTH 5 DAYS     Performed at [facility]   Report Status 09/08/2013 FINAL   Final  CULTURE, RESPIRATORY (NON-EXPECTORATED)     Status: None   Collection Time    09/02/13  4:02 PM      Result Value Range Status    Specimen Description TRACHEAL ASPIRATE   Final   Special Requests NONE   Final   Gram Stain     Final   Value: RARE WBC PRESENT, PREDOMINANTLY MONONUCLEAR     NO SQUAMOUS EPITHELIAL CELLS SEEN     ABUNDANT GRAM NEGATIVE RODS     MODERATE YEAST     Performed at [facility]   Culture     Final   Value: ABUNDANT BURKHOLDERIA CEPACIA     Performed at [facility]   Report Status 09/06/2013 FINAL   Final   Organism ID, Bacteria BURKHOLDERIA CEPACIA   Final  URINE CULTURE     Status: None   Collection Time    09/06/13  9:47 PM      Result Value Range Status   Specimen Description URINE, CATHETERIZED   Final   Special Requests NONE   Final   Culture  Setup Time     Final   Value: 09/07/2013 02:49     Performed at [place]     Final   Value: NO GROWTH     Performed at [facility]   Culture     Final   Value: NO GROWTH     Performed at [facility]   Report Status 09/07/2013 FINAL   Final    Medical History: Past Medical History  Diagnosis Date  . Right-sided heart failure   . COPD (chronic obstructive pulmonary disease)   . Emphysema   . Seizures   . Thyroid disease   . Depression   . Emphysema   . Arthritis     back and legs  . Blood transfusion without reported diagnosis   . High cholesterol   . Anxiety   . Nerves   . Panic attack   . Thyroid disorder   . Hypothyroidism   . Pneumonia   . CHF (congestive heart failure)   . On home O2     4L N/C chronic  . Fibromyalgia    Assessment: 53 year old female with recent copd exacerbation discharged on 1/17 who is being readmitted today with similar complaints of shortness of breath requiring higher amounts of oxygen at home and increase in weight over last week. No fevers, noted, wbc is up to 14.5 today. Orders to start empiric zosyn for antibiotic coverage. No renal dose adjustment warranted, although scr is above previous results.   Goal of Therapy:   Eradication of infection  Plan:  Zosyn 3.375g IV q8 hours Pharmacy to sign off and follow peripherally as no adjustments are needed  Erin Hearing PharmD., BCPS Clinical Pharmacist Pager [phone] 09/19/2013 4:54 PM

## 2013-09-19 NOTE — ED Notes (Signed)
Contacted radiology about wait time on chest xray result

## 2013-09-20 DIAGNOSIS — J181 Lobar pneumonia, unspecified organism: Secondary | ICD-10-CM

## 2013-09-20 DIAGNOSIS — S22000A Wedge compression fracture of unspecified thoracic vertebra, initial encounter for closed fracture: Secondary | ICD-10-CM | POA: Diagnosis present

## 2013-09-20 DIAGNOSIS — L439 Lichen planus, unspecified: Secondary | ICD-10-CM | POA: Diagnosis present

## 2013-09-20 DIAGNOSIS — J189 Pneumonia, unspecified organism: Secondary | ICD-10-CM

## 2013-09-20 DIAGNOSIS — J9620 Acute and chronic respiratory failure, unspecified whether with hypoxia or hypercapnia: Secondary | ICD-10-CM

## 2013-09-20 DIAGNOSIS — J159 Unspecified bacterial pneumonia: Secondary | ICD-10-CM

## 2013-09-20 DIAGNOSIS — M8448XA Pathological fracture, other site, initial encounter for fracture: Secondary | ICD-10-CM

## 2013-09-20 LAB — COMPREHENSIVE METABOLIC PANEL
ALBUMIN: 2.8 g/dL — AB (ref 3.5–5.2)
ALK PHOS: 103 U/L (ref 39–117)
ALT: 14 U/L (ref 0–35)
AST: 20 U/L (ref 0–37)
BILIRUBIN TOTAL: 0.2 mg/dL — AB (ref 0.3–1.2)
BUN: 31 mg/dL — ABNORMAL HIGH (ref 6–23)
CHLORIDE: 94 meq/L — AB (ref 96–112)
CO2: 34 mEq/L — ABNORMAL HIGH (ref 19–32)
Calcium: 8.8 mg/dL (ref 8.4–10.5)
Creatinine, Ser: 1.35 mg/dL — ABNORMAL HIGH (ref 0.50–1.10)
GFR calc Af Amer: 52 mL/min — ABNORMAL LOW (ref >90)
GFR calc non Af Amer: 45 mL/min — ABNORMAL LOW (ref >90)
Glucose, Bld: 156 mg/dL — ABNORMAL HIGH (ref 70–99)
POTASSIUM: 4.2 meq/L (ref 3.7–5.3)
Sodium: 143 mEq/L (ref 137–147)
TOTAL PROTEIN: 6.8 g/dL (ref 6.0–8.3)

## 2013-09-20 LAB — CBC
HEMATOCRIT: 33.2 % — AB (ref 36.0–46.0)
Hemoglobin: 10.8 g/dL — ABNORMAL LOW (ref 12.0–15.0)
MCH: 31.3 pg (ref 26.0–34.0)
MCHC: 32.5 g/dL (ref 30.0–36.0)
MCV: 96.2 fL (ref 78.0–100.0)
PLATELETS: 349 10*3/uL (ref 150–400)
RBC: 3.45 MIL/uL — ABNORMAL LOW (ref 3.87–5.11)
RDW: 17.7 % — AB (ref 11.5–15.5)
WBC: 9.1 10*3/uL (ref 4.0–10.5)

## 2013-09-20 LAB — PATHOLOGIST SMEAR REVIEW

## 2013-09-20 LAB — TROPONIN I

## 2013-09-20 MED ORDER — FUROSEMIDE 10 MG/ML IJ SOLN
40.0000 mg | Freq: Two times a day (BID) | INTRAMUSCULAR | Status: DC
  Administered 2013-09-20 – 2013-09-22 (×4): 40 mg via INTRAVENOUS
  Filled 2013-09-20 (×6): qty 4

## 2013-09-20 MED ORDER — VITAMIN B-12 1000 MCG PO TABS
5000.0000 ug | ORAL_TABLET | Freq: Every day | ORAL | Status: DC
  Administered 2013-09-20 – 2013-09-22 (×3): 5000 ug via ORAL
  Filled 2013-09-20 (×3): qty 5

## 2013-09-20 MED ORDER — PREGABALIN 50 MG PO CAPS
100.0000 mg | ORAL_CAPSULE | Freq: Three times a day (TID) | ORAL | Status: DC
  Administered 2013-09-20 – 2013-09-22 (×7): 100 mg via ORAL
  Filled 2013-09-20 (×7): qty 2

## 2013-09-20 MED ORDER — TIOTROPIUM BROMIDE MONOHYDRATE 18 MCG IN CAPS
18.0000 ug | ORAL_CAPSULE | Freq: Every day | RESPIRATORY_TRACT | Status: DC
  Administered 2013-09-20 – 2013-09-22 (×3): 18 ug via RESPIRATORY_TRACT
  Filled 2013-09-20: qty 5

## 2013-09-20 MED ORDER — LEVALBUTEROL HCL 0.63 MG/3ML IN NEBU
0.6300 mg | INHALATION_SOLUTION | Freq: Four times a day (QID) | RESPIRATORY_TRACT | Status: DC
  Administered 2013-09-20 – 2013-09-22 (×8): 0.63 mg via RESPIRATORY_TRACT
  Filled 2013-09-20 (×12): qty 3

## 2013-09-20 MED ORDER — DULOXETINE HCL 60 MG PO CPEP
60.0000 mg | ORAL_CAPSULE | Freq: Two times a day (BID) | ORAL | Status: DC
  Administered 2013-09-20 – 2013-09-22 (×5): 60 mg via ORAL
  Filled 2013-09-20 (×6): qty 1

## 2013-09-20 MED ORDER — ENOXAPARIN SODIUM 40 MG/0.4ML ~~LOC~~ SOLN
40.0000 mg | SUBCUTANEOUS | Status: DC
  Administered 2013-09-20 – 2013-09-21 (×2): 40 mg via SUBCUTANEOUS
  Filled 2013-09-20 (×3): qty 0.4

## 2013-09-20 MED ORDER — LORATADINE 10 MG PO TABS
10.0000 mg | ORAL_TABLET | Freq: Every day | ORAL | Status: DC
  Administered 2013-09-20 – 2013-09-21 (×2): 10 mg via ORAL
  Filled 2013-09-20 (×3): qty 1

## 2013-09-20 MED ORDER — FERROUS SULFATE 325 (65 FE) MG PO TABS
325.0000 mg | ORAL_TABLET | Freq: Every day | ORAL | Status: DC
Start: 2013-09-20 — End: 2013-09-22
  Administered 2013-09-20 – 2013-09-22 (×3): 325 mg via ORAL
  Filled 2013-09-20 (×3): qty 1

## 2013-09-20 MED ORDER — LINACLOTIDE 145 MCG PO CAPS
145.0000 ug | ORAL_CAPSULE | Freq: Every day | ORAL | Status: DC
  Administered 2013-09-20 – 2013-09-22 (×3): 145 ug via ORAL
  Filled 2013-09-20 (×3): qty 1

## 2013-09-20 MED ORDER — TRAZODONE HCL 150 MG PO TABS
150.0000 mg | ORAL_TABLET | Freq: Every day | ORAL | Status: DC
  Administered 2013-09-20 – 2013-09-21 (×2): 150 mg via ORAL
  Filled 2013-09-20 (×3): qty 1

## 2013-09-20 NOTE — Progress Notes (Signed)
[facility]  [name] YQM:[id number] DOB: 1961/04/10 DOA: 09/19/2013 PCP: [name], NP  Brief narrative: 53 year old female patient well-known to our service. Has had multiple hospitalizations starting in December 2014 for respiratory-related conditions. Discharged one to 2015 after being treated for hypercarbic respiratory failure, pneumonia, sequela from chronic pain and failure to thrive. Was also treated for influenza AH 3 and secondary pneumonia. She was readmitted to the hospital 7 days later her acute toxic respiratory failure. Chest x-ray at that time revealed interstitial pneumonia and she was started on broad-spectrum antibiotics. 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 During that hospitalization patient did have acute delirium related to withdrawal from her usual benzodiazepines and narcotics but rapidly improved once these medications were resumed. She was eventually discharged on 09/10/2013 in stable condition.  She returned to the ER with acute onset of hypoxemia and tachypnea with saturations 85% despite being on 4 L nasal cannula oxygen noting her baseline is 3 L per minute. She reported edema and increasing weight over the past week. She does have known diastolic dysfunction. Because of the degree of respiratory failure she did require BiPAP. Chest x-ray revealed an interstitial edema pattern similar to prior. Because she had been recently hospitalized or concerns for possible pulmonary embolism. CT the chest without contrast revealed multifocal bilateral pulmonary infiltrates especially in the right  upper lobe and starting for either infectious etiology malignancy. VQ scan low probability for PE. In addition the CT scan shows a new completely collapsed midthoracic vertebral body that was chronic in nature that could either be benign or malignant compression  fracture-MRI recommended.  Assessment/Plan: Active Problems: Acute-on-chronic respiratory failure:   A) COPD without acute bronchospasm   B) Chronic diastolic congestive heart failure, NYHA class 1   C) Right upper lobe pneumonia/infiltrate -Baseline 3 L per minute -COPD appears compensated -Suspect acute diastolic heart failure as culprit -Weight at discharge last admission was 139 pounds and now she is 153 pounds -Continue IV Lasix but increase from 20 mg to 40 mg IV twice a day and follow electrolyte --1200 cc since admission-check daily weights -Also has findings concerning for pneumonia process (abnormal CT chest /leukocytosis) so continue antibiotics -Because predominant right upper lobe finding and recent issues with recurrent delirium and deconditioning ,  we'll check swallowing  -admits to dietary indiscretion at home so nutrition eval    Acute delirium -Resolved and likely due to acute hypoxemia -Continue home medications including benzodiazepines and narcotics and Lyrica to prevent withdrawal symptoms    Lichen planus vulva -New finding on exam last admission; [place] recommended to followup with gynecology outpatient but unfortunately has returned to the hospital for this could be accomplished -Given possibility that right upper lobe infiltrate could be malignant as well as an unusual thoracic compression fracture in a 53 year old female that also may be malignant may need to consult gynecology for biopsy of this area before discharge    Thoracic compression fracture -Quite unusual for a 53 year old female -Recommend MRI to clarify so consider pursuing once respiratory status stable    History of anxiety - chronic alprazolam -Continue home meds    FIBROMYALGIA -Continue home meds    History of depression, chronic antidepressants -Continue home meds    Physical deconditioning -PT/OT evaluation   DVT prophylaxis: Lovenox Code Status: Full Family Communication: No  family at bedside Disposition Plan/Expected LOS: Transfer to floor-CIR evaluation  Consultants: None  Procedures: None  Antibiotics: Vancomycin 1/26 >>> Zosyn 1/26 >>>  HPI/Subjective: Patient alert and endorses recent issues with weight gain and lower extremity edema. Productive cough with changing phlegm varying from yellow to yellow to green . Denies chest pain. Denies shortness of breath at rest  Objective: Blood pressure 135/86, pulse 126, temperature 98.6 F (37 C), temperature source Oral, resp. rate 16, height 5\' 2"  (1.575 m), weight 153 lb 6.4 oz (69.582 kg), SpO2 89.00%.  Intake/Output Summary (Last 24 hours) at 09/20/13 1027 Last data filed at 09/20/13 [id number]  Gross per 24 hour  Intake    620 ml  Output   1800 ml  Net  -1180 ml     Exam: General: No acute respiratory distress Lungs: Coarse to auscultation bilaterally with wheezes or crackles, 4L Cardiovascular: Regular rate occasionally tachycardic and rhythm without murmur gallop or rub normal S1 and S2, no peripheral edema or JVD Abdomen: Nontender, nondistended, soft, bowel sounds positive, no rebound, no ascites, no appreciable mass Musculoskeletal: No significant cyanosis, clubbing of bilateral lower extremities Neurological: Alert and oriented x 3, moves all extremities x 4 without focal neurological deficits, CN 2-12 intact  Scheduled Meds:  Scheduled Meds: . diltiazem  300 mg Oral Daily  . DULoxetine  60 mg Oral BID  . enoxaparin (LOVENOX) injection  40 mg Subcutaneous Q24H  . ferrous sulfate  325 mg Oral Daily  . furosemide  40 mg Intravenous Q12H  . levalbuterol  0.63 mg Nebulization Q6H  . levothyroxine  150 mcg Oral QAC breakfast  . Linaclotide  145 mcg Oral Daily  . loratadine  10 mg Oral QHS  . methylPREDNISolone (SOLU-MEDROL) injection  60 mg Intravenous Q6H  . mometasone-formoterol  2 puff Inhalation BID  . pantoprazole  40 mg Oral BID  . piperacillin-tazobactam (ZOSYN)  IV  3.375 g  Intravenous Q8H  . pregabalin  100 mg Oral TID  . sodium chloride  3 mL Intravenous Q12H  . tiotropium  18 mcg Inhalation Daily  . traZODone  150 mg Oral QHS  . vancomycin  750 mg Intravenous Q12H  . vitamin B-12  5,000 mcg Oral Daily   Continuous Infusions: . sodium chloride 10 mL/hr at 09/19/13 2245    Data Reviewed: Basic Metabolic Panel:  Recent Labs Lab 09/19/13 1310 09/20/13 0247  NA 140 143  K 4.6 4.2  CL 96 94*  CO2 30 34*  GLUCOSE 103* 156*  BUN 31* 31*  CREATININE 1.44* 1.35*  CALCIUM 9.4 8.8   Liver Function Tests:  Recent Labs Lab 09/19/13 1724 09/20/13 0247  AST 18 20  ALT 17 14  ALKPHOS 118* 103  BILITOT 0.2* 0.2*  PROT 7.5 6.8  ALBUMIN 3.2* 2.8*   No results found for this basename: LIPASE, AMYLASE,  in the last 168 hours No results found for this basename: AMMONIA,  in the last 168 hours CBC:  Recent Labs Lab 09/19/13 1310 09/19/13 1724 09/20/13 0247  WBC 14.5* 13.8* 9.1  NEUTROABS 11.7*  --   --   HGB 11.3* 11.5* 10.8*  HCT 35.4* 35.7* 33.2*  MCV 97.8 97.5 96.2  PLT 346 374 349   Cardiac Enzymes:  Recent Labs Lab 09/19/13 1310 09/19/13 1724 09/19/13 2108 09/20/13 0247  TROPONINI <0.30 <0.30 <0.30 <0.30   BNP (last 3 results)  Recent Labs  08/20/13 1926 09/02/13 1145 09/19/13 1310  PROBNP 736.3* 622.9* 506.1*   CBG: No results found for this basename: GLUCAP,  in the last 168  hours  Recent Results (from the past 240 hour(s))  MRSA PCR SCREENING     Status: None   Collection Time    09/19/13  6:05 PM      Result Value Range Status   MRSA by PCR NEGATIVE  NEGATIVE Final   Comment:            The GeneXpert MRSA Assay (FDA     approved for NASAL specimens     only), is one component of a     comprehensive MRSA colonization     surveillance program. It is not     intended to diagnose MRSA     infection nor to guide or     monitor treatment for     MRSA infections.     Studies:  Recent x-ray studies have been  reviewed in detail by the Attending Physician  Time spent :     Erin Hearing, [place] [facility] Office  [phone] Pager [phone]  **If unable to reach the above provider after paging please contact the [place] @ [phone]  On-Call/Text Page:      [email or web address]      password [email or web address]  If 7PM-7AM, please contact night-coverage [email or web address] Password [facility] 09/20/2013, 10:27 AM   LOS: 1 day   I have examined the patient, reviewed the chart and modified the above note which I agree with.   Ran Tullis,MD [phone] 09/20/2013, 4:16 PM

## 2013-09-20 NOTE — Plan of Care (Signed)
Problem: Food- and Nutrition-Related Knowledge Deficit (NB-1.1) Goal: Nutrition education Formal process to instruct or train a patient/client in a skill or to impart knowledge to help patients/clients voluntarily manage or modify food choices and eating behavior to maintain or improve health. Nutrition Education Note  RD consulted for nutrition education regarding new onset CHF.  RD provided "Low Sodium Nutrition Therapy" handout from the Academy of [employer]. Reviewed patient's dietary recall. Provided examples on ways to decrease sodium intake in diet. Discouraged intake of processed foods and use of salt shaker. Encouraged fresh fruits and vegetables as well as whole grain sources of carbohydrates to maximize fiber intake.   RD discussed why it is important for patient to adhere to diet recommendations, and emphasized the role of fluids, foods to avoid, and importance of weighing self daily. Pt tearful during visit.  She has been to the hospital 5 times in the past 2 months per her reports and is ready to be at home.  Discussed the role of a low sodium diet in helping achieve this.  Pt has a Catering manager who visits her home and is planning to also go over some low sodium education with her. Teach back method used.  Expect good compliance.  Body mass index is 28.05 kg/(m^2). Pt meets criteria for overweight based on current BMI.  Current diet order is Heart Healthy, patient is consuming approximately 50% of meals at this time. Labs and medications reviewed. No further nutrition interventions warranted at this time. RD contact information provided. If additional nutrition issues arise, please re-consult RD.   Brynda Greathouse, MS RD LDN Clinical Inpatient Dietitian Pager: [phone] Weekend/After hours pager: [phone]

## 2013-09-20 NOTE — Care Management Note (Signed)
    Page 1 of 1   09/20/2013     9:04:09 AM   CARE MANAGEMENT NOTE 09/20/2013  Patient:  [name], [name]   Account Number:  [id number]  Date Initiated:  09/20/2013  Documentation initiated by:  Elissa Hefty  Subjective/Objective Assessment:   adm w copd exacerb     Action/Plan:   lives w husband , pcp dr Heide Scales   Anticipated DC Date:     Anticipated DC Plan:           Choice offered to / List presented to:             Status of service:   Medicare Important Message given?   (If response is "NO", the following Medicare IM given date fields will be blank) Date Medicare IM given:   Date Additional Medicare IM given:    Discharge Disposition:    Per UR Regulation:  Reviewed for med. necessity/level of care/duration of stay  If discussed at [place] of Stay Meetings, dates discussed:    Comments:

## 2013-09-20 NOTE — Consult Note (Signed)
Physical Medicine and Rehabilitation Consult  Reason for Consult: Multiple admission due to respiratory failure Referring Physician: Dr. Thereasa Solo   HPI: [name] is a 53 y.o. female recent COPD exacerbation discharged on 1/17, ongoing tobacco abuse, hypercarbic respiratory failure requiring BiPAP and subsequently intubation, also diagnosed with influenza A. during her last admission, treated with Tamiflu and antibiotics, chronically on 2-4 L of oxygen, discharged on 1/17 who returned to ED on 09/19/13 with similar complaints. Patient delirious with minimal improvement duoneb and Solu-Medrol, was tachypneic and desaturated to 85% on 4 L of oxygen which is her baseline. She appear to be short of breath and unable to speak in full sentences and reported gaining about 5 pounds in the last week., Recent 2-D echo showed grade 1 diastolic dysfunction. Patient treated with IV lasix as well as anxiolytics and BIPAP. CT chest Multifocal bilateral pulmonary infiltrates, particularly in the right upper lobe associated with nodularity-- most likely infectious in  origin. Incidental note of new completely collapsed mid thoracic vertebral body with mild retropulsion--benign v/s malignant compression fracture. Patient placed on IV vancomycin for treatment of PNA. MD recommending CIR.   Patient reports that she got to chair with nursing and got hypoxic with activity. Note that patient will need PT/OT evaluations to determine appropriate rehab needs.    Review of Systems  Eyes: Negative for blurred vision and double vision.  Respiratory: Positive for cough, sputum production and shortness of breath.   Cardiovascular: Negative for chest pain and palpitations.  Gastrointestinal: Negative for heartburn.  Genitourinary: Positive for frequency. Negative for urgency.  Musculoskeletal: Negative for back pain and myalgias.  Neurological: Positive for sensory change (numbness bilateral hands and feet.).     Past Medical History  Diagnosis Date  . Right-sided heart failure   . COPD (chronic obstructive pulmonary disease)   . Emphysema   . Seizures   . Thyroid disease   . Depression   . Emphysema   . Arthritis     back and legs  . Blood transfusion without reported diagnosis   . High cholesterol   . Anxiety   . Nerves   . Panic attack   . Thyroid disorder   . Hypothyroidism   . Pneumonia   . CHF (congestive heart failure)   . On home O2     4L N/C chronic  . Fibromyalgia    History reviewed. No pertinent past surgical history.  No family history on file.  Social History:   Married. Independent without AD. Has husband and son who provide assistance as needed. She reports that she quit smoking about 7 weeks ago. She does not have any smokeless tobacco history on file. She reports that she does not drink alcohol or use illicit drugs.   Allergies  Allergen Reactions  . Tape Itching and Rash   Medications Prior to Admission  Medication Sig Dispense Refill  . ALPRAZolam (XANAX) 0.5 MG tablet Take 0.5 mg by mouth 2 (two) times daily as needed for anxiety.      . Calcium Carb-Cholecalciferol (CALCIUM 600 + D PO) Take 1 tablet by mouth 2 (two) times daily.      Marland Kitchen diltiazem (TIAZAC) 300 MG 24 hr capsule Take 300 mg by mouth daily.      . DULoxetine (CYMBALTA) 60 MG capsule Take 60 mg by mouth 2 (two) times daily.      . ferrous sulfate 325 (65 FE) MG EC tablet Take 325 mg by mouth daily.      Marland Kitchen  Fluticasone-Salmeterol (ADVAIR) 250-50 MCG/DOSE AEPB Inhale 1 puff into the lungs every 12 (twelve) hours.      . furosemide (LASIX) 40 MG tablet Take 40 mg by mouth daily.      Marland Kitchen levofloxacin (LEVAQUIN) 750 MG tablet Take 1 tablet (750 mg total) by mouth daily.  1 tablet  0  . levothyroxine (SYNTHROID, LEVOTHROID) 150 MCG tablet Take 150 mcg by mouth daily.      . Linaclotide (LINZESS) 145 MCG CAPS capsule Take 145 mcg by mouth daily.      Marland Kitchen loratadine (CLARITIN) 10 MG tablet Take 10 mg  by mouth at bedtime.      . Oxycodone HCl 10 MG TABS Take 0.5 tablets (5 mg total) by mouth every 6 (six) hours as needed (for pain).    0  . pantoprazole (PROTONIX) 40 MG tablet Take 1 tablet (40 mg total) by mouth 2 (two) times daily.  60 tablet  2  . potassium chloride SA (K-DUR,KLOR-CON) 20 MEQ tablet Take 20 mEq by mouth daily.      . pregabalin (LYRICA) 100 MG capsule Take 100 mg by mouth 3 (three) times daily.      Marland Kitchen tiotropium (SPIRIVA) 18 MCG inhalation capsule Place 18 mcg into inhaler and inhale daily.      . traZODone (DESYREL) 150 MG tablet Take 150 mg by mouth at bedtime.      . vitamin B-12 (CYANOCOBALAMIN) 1000 MCG tablet Take 5,000 mcg by mouth daily.        Home: Home Living Family/patient expects to be discharged to:: Private residence Living Arrangements: Spouse/significant other  Functional History:   Functional Status:  Mobility:          ADL:    Cognition: Cognition Orientation Level: Oriented X4    Blood pressure 135/86, pulse 104, temperature 98.6 F (37 C), temperature source Oral, resp. rate 11, height 5\' 2"  (1.575 m), weight 69.582 kg (153 lb 6.4 oz), SpO2 90.00%. Physical Exam  Constitutional: She is oriented to person, place, and time. She appears well-developed and well-nourished. She has a sickly appearance.  Sitting up in chair with 4 L oxygen per [place].   HENT:  Head: Normocephalic and atraumatic.  Eyes: Conjunctivae are normal. Pupils are equal, round, and reactive to light.  Neck: Normal range of motion. Neck supple.  Cardiovascular: Regular rhythm.  Tachycardia present.   Respiratory: No respiratory distress. She has wheezes. She has rhonchi in the right upper field, the right middle field and the left lower field.  GI: Soft. Bowel sounds are normal. She exhibits no distension.  Musculoskeletal: She exhibits no edema and no tenderness.  Neurological: She is alert and oriented to person, place, and time. She displays normal reflexes. No  cranial nerve deficit. She exhibits normal muscle tone. Coordination normal.  Moves all 4's symmetrically. No gross sensory deficits.   Skin: Skin is warm and dry.  Psychiatric: She has a normal mood and affect. Her behavior is normal. Judgment and thought content normal.    Results for orders placed during the hospital encounter of 09/19/13 (from the past 24 hour(s))  CBC WITH DIFFERENTIAL     Status: Abnormal   Collection Time    09/19/13  1:10 PM      Result Value Range   WBC 14.5 (*) 4.0 - 10.5 K/uL   RBC 3.62 (*) 3.87 - 5.11 MIL/uL   Hemoglobin 11.3 (*) 12.0 - 15.0 g/dL   HCT 35.4 (*) 36.0 - 46.0 %  MCV 97.8  78.0 - 100.0 fL   MCH 31.2  26.0 - 34.0 pg   MCHC 31.9  30.0 - 36.0 g/dL   RDW 17.8 (*) 11.5 - 15.5 %   Platelets 346  150 - 400 K/uL   Neutrophils Relative % 81 (*) 43 - 77 %   Lymphocytes Relative 10 (*) 12 - 46 %   Monocytes Relative 8  3 - 12 %   Eosinophils Relative 0  0 - 5 %   Basophils Relative 1  0 - 1 %   nRBC 1 (*) 0 /100 WBC   Neutro Abs 11.7 (*) 1.7 - 7.7 K/uL   Lymphs Abs 1.5  0.7 - 4.0 K/uL   Monocytes Absolute 1.2 (*) 0.1 - 1.0 K/uL   Eosinophils Absolute 0.0  0.0 - 0.7 K/uL   Basophils Absolute 0.1  0.0 - 0.1 K/uL   RBC Morphology POLYCHROMASIA PRESENT     WBC Morphology       Value: MODERATE LEFT SHIFT (>5% METAS AND MYELOS,OCC PRO NOTED)  PRO B NATRIURETIC PEPTIDE     Status: Abnormal   Collection Time    09/19/13  1:10 PM      Result Value Range   Pro B Natriuretic peptide (BNP) 506.1 (*) 0 - 125 pg/mL  TROPONIN I     Status: None   Collection Time    09/19/13  1:10 PM      Result Value Range   Troponin I <0.30  <0.30 ng/mL  BASIC METABOLIC PANEL     Status: Abnormal   Collection Time    09/19/13  1:10 PM      Result Value Range   Sodium 140  137 - 147 mEq/L   Potassium 4.6  3.7 - 5.3 mEq/L   Chloride 96  96 - 112 mEq/L   CO2 30  19 - 32 mEq/L   Glucose, Bld 103 (*) 70 - 99 mg/dL   BUN 31 (*) 6 - 23 mg/dL   Creatinine, Ser 1.44 (*)  0.50 - 1.10 mg/dL   Calcium 9.4  8.4 - 10.5 mg/dL   GFR calc non Af Amer 42 (*) >90 mL/min   GFR calc Af Amer 48 (*) >90 mL/min  INFLUENZA PANEL BY PCR (TYPE A & B, H1N1)     Status: None   Collection Time    09/19/13  2:58 PM      Result Value Range   Influenza A By PCR NEGATIVE  NEGATIVE   Influenza B By PCR NEGATIVE  NEGATIVE   H1N1 flu by pcr NOT DETECTED  NOT DETECTED  CBC     Status: Abnormal   Collection Time    09/19/13  5:24 PM      Result Value Range   WBC 13.8 (*) 4.0 - 10.5 K/uL   RBC 3.66 (*) 3.87 - 5.11 MIL/uL   Hemoglobin 11.5 (*) 12.0 - 15.0 g/dL   HCT 35.7 (*) 36.0 - 46.0 %   MCV 97.5  78.0 - 100.0 fL   MCH 31.4  26.0 - 34.0 pg   MCHC 32.2  30.0 - 36.0 g/dL   RDW 17.8 (*) 11.5 - 15.5 %   Platelets 374  150 - 400 K/uL  HEPATIC FUNCTION PANEL     Status: Abnormal   Collection Time    09/19/13  5:24 PM      Result Value Range   Total Protein 7.5  6.0 - 8.3 g/dL   Albumin 3.2 (*)  3.5 - 5.2 g/dL   AST 18  0 - 37 U/L   ALT 17  0 - 35 U/L   Alkaline Phosphatase 118 (*) 39 - 117 U/L   Total Bilirubin 0.2 (*) 0.3 - 1.2 mg/dL   Bilirubin, Direct <0.2  0.0 - 0.3 mg/dL   Indirect Bilirubin NOT CALCULATED  0.3 - 0.9 mg/dL  TSH     Status: None   Collection Time    09/19/13  5:24 PM      Result Value Range   TSH 0.623  0.350 - 4.500 uIU/mL  PROTIME-INR     Status: Abnormal   Collection Time    09/19/13  5:24 PM      Result Value Range   Prothrombin Time 11.2 (*) 11.6 - 15.2 seconds   INR 0.82  0.00 - 1.49  TROPONIN I     Status: None   Collection Time    09/19/13  5:24 PM      Result Value Range   Troponin I <0.30  <0.30 ng/mL  D-DIMER, QUANTITATIVE     Status: Abnormal   Collection Time    09/19/13  5:24 PM      Result Value Range   D-Dimer, Quant 0.92 (*) 0.00 - 0.48 ug/mL-FEU  POCT I-STAT 3, BLOOD GAS (G3+)     Status: Abnormal   Collection Time    09/19/13  5:40 PM      Result Value Range   pH, Arterial 7.472 (*) 7.350 - 7.450   pCO2 arterial 48.1  (*) 35.0 - 45.0 mmHg   pO2, Arterial 69.0 (*) 80.0 - 100.0 mmHg   Bicarbonate 35.2 (*) 20.0 - 24.0 mEq/L   TCO2 37  0 - 100 mmol/L   O2 Saturation 94.0     Acid-Base Excess 10.0 (*) 0.0 - 2.0 mmol/L   Patient temperature 98.6 F     Collection site RADIAL, ALLEN'S TEST ACCEPTABLE     Drawn by RT     Sample type ARTERIAL    MRSA PCR SCREENING     Status: None   Collection Time    09/19/13  6:05 PM      Result Value Range   MRSA by PCR NEGATIVE  NEGATIVE  TROPONIN I     Status: None   Collection Time    09/19/13  9:08 PM      Result Value Range   Troponin I <0.30  <0.30 ng/mL  TROPONIN I     Status: None   Collection Time    09/20/13  2:47 AM      Result Value Range   Troponin I <0.30  <0.30 ng/mL  COMPREHENSIVE METABOLIC PANEL     Status: Abnormal   Collection Time    09/20/13  2:47 AM      Result Value Range   Sodium 143  137 - 147 mEq/L   Potassium 4.2  3.7 - 5.3 mEq/L   Chloride 94 (*) 96 - 112 mEq/L   CO2 34 (*) 19 - 32 mEq/L   Glucose, Bld 156 (*) 70 - 99 mg/dL   BUN 31 (*) 6 - 23 mg/dL   Creatinine, Ser 1.35 (*) 0.50 - 1.10 mg/dL   Calcium 8.8  8.4 - 10.5 mg/dL   Total Protein 6.8  6.0 - 8.3 g/dL   Albumin 2.8 (*) 3.5 - 5.2 g/dL   AST 20  0 - 37 U/L   ALT 14  0 - 35 U/L   Alkaline Phosphatase  103  39 - 117 U/L   Total Bilirubin 0.2 (*) 0.3 - 1.2 mg/dL   GFR calc non Af Amer 45 (*) >90 mL/min   GFR calc Af Amer 52 (*) >90 mL/min  CBC     Status: Abnormal   Collection Time    09/20/13  2:47 AM      Result Value Range   WBC 9.1  4.0 - 10.5 K/uL   RBC 3.45 (*) 3.87 - 5.11 MIL/uL   Hemoglobin 10.8 (*) 12.0 - 15.0 g/dL   HCT 33.2 (*) 36.0 - 46.0 %   MCV 96.2  78.0 - 100.0 fL   MCH 31.3  26.0 - 34.0 pg   MCHC 32.5  30.0 - 36.0 g/dL   RDW 17.7 (*) 11.5 - 15.5 %   Platelets 349  150 - 400 K/uL   Dg Chest 2 View  09/19/2013   CLINICAL DATA:  Short of breath  EXAM: CHEST  2 VIEW  COMPARISON:  DG CHEST 1V PORT dated 09/05/2013  FINDINGS: Normal cardiac silhouette.  There is a fine interstitial pattern representing interstitial edema. This is similar to comparison exam. This mild basilar atelectasis. No pleural fluid. Lungs are mildly hyperinflated  IMPRESSION: Interstitial edema pattern similar prior.  Mild basilar atelectasis.   Electronically Signed   By: Suzy Bouchard M.D.   On: 09/19/2013 15:14   Ct Chest Wo Contrast  09/19/2013   CLINICAL DATA:  CHF.  EXAM: CT CHEST WITHOUT CONTRAST  TECHNIQUE: Multidetector CT imaging of the chest was performed following the standard protocol without IV contrast.  COMPARISON:  Chest x-ray 09/19/2013. CT chest 07/24/2013. Chest x-ray 07/19/2013.  FINDINGS: Thoracic aorta normal in caliber. Coronary artery disease. Heart size normal. Mild pericardial thickening.  No significant adenopathy. Thoracic esophagus unremarkable. Adrenals normal. Bilateral renal cysts. Left renal atrophy. Splenosis. Cholecystectomy.  Large airways are patent. Multifocal bilateral pulmonary infiltrates with nodularity noted. These are particularly prominent in the right upper lobe. These are most likely infectious given that these infiltrates were not present on prior recent CT of 07/24/2013. Close follow-up chest CT's suggested to demonstrate clearing. Previously identified dense infiltrate in the left lung base has partially cleared. No pleural effusion or pneumothorax. Bullous COPD.  Diffuse degenerative changes thoracic spine. New completely collapsed mid thoracic vertebral body is present. This is sclerotic and new from prior chest x-ray of 07/19/2013 . This could represent a benign or malignant compression fracture. There is mild retropulsion of fragments.  IMPRESSION: 1. Multifocal bilateral pulmonary infiltrates, particularly in the right upper lobe. This is associated with nodularity. This is new from prior chest CT of 07/24/2013 and is most likely infectious in origin ,however malignancy cannot be excluded and follow-up chest CT's are suggested to  demonstrate clearing. Active granulomatous disease including TB or fungal disease cannot be excluded. 2. Previously identified dense infiltrate left lung base on CT of 07/24/2013 has partially cleared. 3. New completely collapsed mid thoracic vertebral body. This is sclerotic. This is new from prior chest x-ray 07/19/2013. This could be a benign or malignant compression fracture. There is mild retropulsion of posterior fragments. MRI may prove useful for further evaluation.   Electronically Signed   By: Black   On: 09/19/2013 16:45   Nm Pulmonary Perf And Vent  09/19/2013   CLINICAL DATA:  Shortness of breath.  Elevated D-dimer.  EXAM: NUCLEAR MEDICINE VENTILATION - PERFUSION LUNG SCAN  TECHNIQUE: Ventilation images were obtained in multiple projections using inhaled aerosol technetium 99  M DTPA. Perfusion images were obtained in multiple projections after intravenous injection of Tc-70m MAA.  COMPARISON:  CT and plain films of the chest 09/19/2013.  RADIOPHARMACEUTICALS:  40 MCi Tc-40m DTPA aerosol and 6 mCi Tc-85m MAA  FINDINGS: Ventilation: Ventilation is markedly heterogeneous with decreased ventilation in the mid and upper lung zones bilaterally, worse on the right.  Perfusion: No wedge shaped peripheral perfusion defects to suggest acute pulmonary embolism.  IMPRESSION: Negative for pulmonary embolus.  Findings compatible with emphysema and pneumonia as seen on comparison CT.   Electronically Signed   By: Inge Rise M.D.   On: 09/19/2013 17:23    Assessment/Plan: Diagnosis: pneumonia, acute on chronic respiratory failure 1. Does the need for close, 24 hr/day medical supervision in concert with the patient's rehab needs make it unreasonable for this patient to be served in a less intensive setting? No 2. Co-Morbidities requiring supervision/potential complications: see above 3. Due to bladder management, bowel management, safety, skin/wound care, disease management and medication  administration, does the patient require 24 hr/day rehab nursing? No 4. Does the patient require coordinated care of a physician, rehab nurse, PT, OT to address physical and functional deficits in the context of the above medical diagnosis(es)? No Addressing deficits in the following areas: endurance, locomotion, transferring and bathing 5. Can the patient actively participate in an intensive therapy program of at least 3 hrs of therapy per day at least 5 days per week? Potentially 6. The potential for patient to make measurable gains while on inpatient rehab is fair 7. Anticipated functional outcomes upon discharge from inpatient rehab are n/a with PT, n/a with OT, n/a with SLP. 8. Estimated rehab length of stay to reach the above functional goals is: n/a 9. Does the patient have adequate social supports to accommodate these discharge functional goals? Yes 10. Anticipated D/C setting: Home 11. Anticipated post D/C treatments: [place] therapy 12. Overall Rehab/Functional Prognosis: good  RECOMMENDATIONS: This patient's condition is appropriate for continued rehabilitative care in the following setting: [facility] Patient has agreed to participate in recommended program. Yes Note that insurance prior authorization may be required for reimbursement for recommended care.  Comment: Discussed with the patient that the focus of her home therapies needs to be on cardiorespiratory stamina and pulmonary rehab as opposed to basic balance and mobility exercises. She does not need  nor does she want CIR.  Meredith Staggers, MD, [facility]     09/20/2013

## 2013-09-20 NOTE — Evaluation (Signed)
Physical Therapy Evaluation Patient Details Name: [name] MRN: [id number] DOB: 01/11/1961 Today's Date: 09/20/2013 Time: 4098-1191 PT Time Calculation (min): 16 min  PT Assessment / Plan / Recommendation History of Present Illness  53 year old female with a history of recent COPD exacerbation discharged on 1/17, ongoing tobacco abuse, hypercarbic respiratory failure requiring BiPAP and subsequently intubation, also diagnosed with influenza A. during her last admission, treated with Tamiflu and antibiotics, chronically on 2-4 L of oxygen, discharged on 1/17 who returned 1/26 with similar complaints. RUL infiltrate and vertebral compression fx concerning for possible malignancy  Clinical Impression  Pt familiar from prior admission and limited by decreased saturation with activity on 6L dropping to 87%. Pt cued for energy conservation, breathing technique and need for standing rest breaks, pt without SOB. Pt will benefit from acute therapy to maximize mobility, gait and function to return home with spouse and address below deficits acutely. Recommend daily mobility with nursing.    PT Assessment  Patient needs continued PT services    Follow Up Recommendations  Home health PT    Does the patient have the potential to tolerate intense rehabilitation      Barriers to Discharge        Equipment Recommendations  None recommended by PT    Recommendations for Other Services     Frequency Min 3X/week    Precautions / Restrictions Precautions Precautions: Fall   Pertinent Vitals/Pain HR 104 Pt with chronic back pain for years and reports slight pain with gait      Mobility  Transfers Overall transfer level: Needs assistance Transfers: Sit to/from Stand Sit to Stand: Supervision General transfer comment: supervision for lines Ambulation/Gait Ambulation/Gait assistance: Supervision Ambulation Distance (Feet): 30 Feet Assistive device: Rolling walker (2 wheeled) Gait  Pattern/deviations: Step-through pattern;Decreased stride length General Gait Details: distance and speed limited by decreased O2 sats on 6L 87-94 with gait    Exercises     PT Diagnosis: Difficulty walking  PT Problem List: Decreased activity tolerance;Decreased mobility;Decreased strength;Cardiopulmonary status limiting activity PT Treatment Interventions: Gait training;Functional mobility training;Therapeutic activities;Therapeutic exercise;Patient/family education     PT Goals(Current goals can be found in the care plan section) Acute Rehab PT Goals Patient Stated Goal: to go home PT Goal Formulation: With patient Time For Goal Achievement: 10/04/13 Potential to Achieve Goals: Good  Visit Information  Last PT Received On: 09/20/13 Assistance Needed: +1 History of Present Illness: 53 year old female with a history of recent COPD exacerbation discharged on 1/17, ongoing tobacco abuse, hypercarbic respiratory failure requiring BiPAP and subsequently intubation, also diagnosed with influenza A. during her last admission, treated with Tamiflu and antibiotics, chronically on 2-4 L of oxygen, discharged on 1/17 who returned 1/26 with similar complaints. RUL infiltrate and vertebral compression fx concerning for possible malignancy       Prior [place] expects to be discharged to:: Private residence Living Arrangements: Spouse/significant other Available Help at Discharge: Family;Available 24 hours/day Type of Home: House Home Access: Stairs to enter [employer] of Steps: 4 Entrance Stairs-Rails: None Home Layout: One level;Multi-level Alternate Level Stairs-Number of Steps: 7 Home Equipment: Cane - single point;Bedside commode;Shower seat Prior Function Level of Independence: Independent with assistive device(s) Communication Communication: No difficulties    Cognition  Cognition Behavior During Therapy: WFL for tasks  assessed/performed Overall Cognitive Status: Within Functional Limits for tasks assessed    Extremity/Trunk Assessment Upper Extremity Assessment Upper Extremity Assessment: Overall WFL for tasks assessed Lower Extremity Assessment Lower Extremity Assessment:  Generalized weakness Cervical / Trunk Assessment Cervical / Trunk Assessment: Normal   Balance    End of Session PT - End of Session Equipment Utilized During Treatment: Gait belt;Oxygen Activity Tolerance: Patient tolerated treatment well Patient left: in chair;with call bell/phone within reach Nurse Communication: Mobility status  GP     Lanetta Inch [facility] 09/20/2013, 2:37 PM Elwyn Reach, [place]

## 2013-09-21 DIAGNOSIS — L439 Lichen planus, unspecified: Secondary | ICD-10-CM

## 2013-09-21 DIAGNOSIS — Z8659 Personal history of other mental and behavioral disorders: Secondary | ICD-10-CM

## 2013-09-21 DIAGNOSIS — R5381 Other malaise: Secondary | ICD-10-CM

## 2013-09-21 DIAGNOSIS — R404 Transient alteration of awareness: Secondary | ICD-10-CM

## 2013-09-21 DIAGNOSIS — F411 Generalized anxiety disorder: Secondary | ICD-10-CM

## 2013-09-21 LAB — BASIC METABOLIC PANEL
BUN: 45 mg/dL — ABNORMAL HIGH (ref 6–23)
CHLORIDE: 98 meq/L (ref 96–112)
CO2: 34 meq/L — AB (ref 19–32)
Calcium: 8.3 mg/dL — ABNORMAL LOW (ref 8.4–10.5)
Creatinine, Ser: 1.62 mg/dL — ABNORMAL HIGH (ref 0.50–1.10)
GFR calc Af Amer: 42 mL/min — ABNORMAL LOW (ref >90)
GFR calc non Af Amer: 36 mL/min — ABNORMAL LOW (ref >90)
GLUCOSE: 153 mg/dL — AB (ref 70–99)
POTASSIUM: 3.4 meq/L — AB (ref 3.7–5.3)
SODIUM: 146 meq/L (ref 137–147)

## 2013-09-21 LAB — CBC
HEMATOCRIT: 33.4 % — AB (ref 36.0–46.0)
HEMOGLOBIN: 11.1 g/dL — AB (ref 12.0–15.0)
MCH: 31.7 pg (ref 26.0–34.0)
MCHC: 33.2 g/dL (ref 30.0–36.0)
MCV: 95.4 fL (ref 78.0–100.0)
Platelets: 326 10*3/uL (ref 150–400)
RBC: 3.5 MIL/uL — AB (ref 3.87–5.11)
RDW: 18 % — ABNORMAL HIGH (ref 11.5–15.5)
WBC: 8.5 10*3/uL (ref 4.0–10.5)

## 2013-09-21 MED ORDER — POTASSIUM CHLORIDE CRYS ER 20 MEQ PO TBCR
40.0000 meq | EXTENDED_RELEASE_TABLET | Freq: Once | ORAL | Status: AC
  Administered 2013-09-21: 40 meq via ORAL
  Filled 2013-09-21: qty 2

## 2013-09-21 NOTE — Evaluation (Signed)
Occupational Therapy Evaluation Patient Details Name: [name] MRN: [id number] DOB: 22-Sep-1960 Today's Date: 09/21/2013 Time: 1027-2536 OT Time Calculation (min): 18 min  OT Assessment / Plan / Recommendation History of present illness 53 year old female with a history of recent COPD exacerbation discharged on 1/17, ongoing tobacco abuse, hypercarbic respiratory failure requiring BiPAP and subsequently intubation, also diagnosed with influenza A. during her last admission, treated with Tamiflu and antibiotics, chronically on 2-4 L of oxygen, discharged on 1/17 who returned 1/26 with similar complaints. RUL infiltrate and vertebral compression fx concerning for possible malignancy   Clinical Impression   Pt admitted with above.  She presents to OT with the below listed deficits.  She is deconditioned and will benefit from continued OT to maximize safety and independence with BADLs.  Recommend HHOT    OT Assessment  Patient needs continued OT Services    Follow Up Recommendations  Home health OT;Supervision/Assistance - 24 hour    Barriers to Discharge      Equipment Recommendations  None recommended by OT    Recommendations for Other Services    Frequency  Min 2X/week    Precautions / Restrictions Precautions Precautions: Fall Precaution Comments: watch HR   Pertinent Vitals/Pain     ADL  Eating/Feeding: Independent Where Assessed - Eating/Feeding: Chair Grooming: Wash/dry hands;Wash/dry face;Teeth care;Brushing hair;Supervision/safety Where Assessed - Grooming: Unsupported standing Upper Body Bathing: Supervision/safety Where Assessed - Upper Body Bathing: Unsupported standing Lower Body Bathing: Supervision/safety Where Assessed - Lower Body Bathing: Unsupported standing Upper Body Dressing: Set up Where Assessed - Upper Body Dressing: Unsupported sitting Lower Body Dressing: Supervision/safety Where Assessed - Lower Body Dressing: Unsupported sit to stand Toilet  Transfer: Supervision/safety Toilet Transfer Method: Sit to stand;Stand pivot Science writer: Comfort height toilet Toileting - Clothing Manipulation and Hygiene: Supervision/safety Where Assessed - Toileting Clothing Manipulation and Hygiene: Standing Transfers/Ambulation Related to ADLs: Supervision ADL Comments: Pt reports her sats decreased into low 80s with PT and on 4L.  On 6L, pt able to ambulate in room, and perform simulated shower in standing with sats >92%.  02 decreased to 4L at end of session.  Pt. instructed in energy conservation.  Also instructed pt to sit to shower until she is stronger to conserve energy.  HR 125 with activity     OT Diagnosis: Generalized weakness  OT Problem List: Decreased strength;Decreased activity tolerance;Impaired balance (sitting and/or standing);Decreased knowledge of use of DME or AE;Cardiopulmonary status limiting activity OT Treatment Interventions: Self-care/ADL training;Therapeutic exercise;Patient/family education;Balance training   OT Goals(Current goals can be found in the care plan section) Acute Rehab OT Goals Patient Stated Goal: to go home OT Goal Formulation: With patient Time For Goal Achievement: 10/05/13 Potential to Achieve Goals: Good ADL Goals Pt Will Perform Grooming: with modified independence;standing Pt Will Perform Upper Body Bathing: with modified independence;sitting Pt Will Perform Lower Body Bathing: with modified independence;sit to/from stand Pt Will Perform Upper Body Dressing: with modified independence;sitting Pt Will Perform Lower Body Dressing: with modified independence;sit to/from stand Pt Will Transfer to Toilet: with modified independence;ambulating;regular height toilet;grab bars Pt Will Perform Toileting - Clothing Manipulation and hygiene: with modified independence;sit to/from stand Pt Will Perform Tub/Shower Transfer: Tub transfer;ambulating;shower seat Pt/caregiver will Perform Home  Exercise Program: Increased strength;Both right and left upper extremity;With theraband Additional ADL Goal #1: Pt will be independent with energy conservation techniques  Visit Information  Last OT Received On: 09/21/13 Assistance Needed: +1 History of Present Illness: 53 year old female with a history of recent  COPD exacerbation discharged on 1/17, ongoing tobacco abuse, hypercarbic respiratory failure requiring BiPAP and subsequently intubation, also diagnosed with influenza A. during her last admission, treated with Tamiflu and antibiotics, chronically on 2-4 L of oxygen, discharged on 1/17 who returned 1/26 with similar complaints. RUL infiltrate and vertebral compression fx concerning for possible malignancy       Prior [place] expects to be discharged to:: Private residence Living Arrangements: Spouse/significant other Available Help at Discharge: Family;Available 24 hours/day Type of Home: House Home Access: Stairs to enter [employer] of Steps: 4 Entrance Stairs-Rails: None Home Layout: One level;Multi-level Alternate Level Stairs-Number of Steps: 7 Home Equipment: Cane - single point;Bedside commode;Shower seat Prior Function Level of Independence: Independent with assistive device(s) Comments: 3L 02 at home Communication Communication: No difficulties         Vision/Perception     Cognition  Cognition Arousal/Alertness: Awake/alert Behavior During Therapy: WFL for tasks assessed/performed Overall Cognitive Status: Within Functional Limits for tasks assessed    Extremity/Trunk Assessment Upper Extremity Assessment Upper Extremity Assessment: Generalized weakness Lower Extremity Assessment Lower Extremity Assessment: Defer to PT evaluation Cervical / Trunk Assessment Cervical / Trunk Assessment: Normal     Mobility Transfers Overall transfer level: Needs assistance Transfers: Sit to/from Stand;Stand Pivot  Transfers Sit to Stand: Supervision Stand pivot transfers: Supervision General transfer comment: supervision for lines     Exercise     Balance Balance Overall balance assessment: Needs assistance Sitting-balance support: Feet supported Sitting balance-Leahy Scale: Normal Standing balance support: No upper extremity supported Standing balance-Leahy Scale: Good   End of Session OT - End of Session Equipment Utilized During Treatment: Oxygen Activity Tolerance: Patient tolerated treatment well Patient left: in chair;with call bell/phone within reach  [place], Iosefa Weintraub M 09/21/2013, 3:15 PM

## 2013-09-21 NOTE — Progress Notes (Addendum)
[facility]                                                                              Patient Demographics  [name], is a 53 y.o. female, DOB - 07-30-1961, [id number]  Admit date - 09/19/2013   Admitting Physician Reyne Dumas, MD  Outpatient Primary MD for the patient is Kelsey Riches, NP  LOS - 2   Chief Complaint  Patient presents with  . Shortness of Breath        Assessment & Plan   Acute-on-chronic respiratory failure:  A) COPD without acute bronchospasm  B) Chronic diastolic congestive heart failure, NYHA class 1  C) Right upper lobe pneumonia/infiltrate  -Baseline 3 L per minute (uses up to 4L at home) -COPD appears compensated  -Suspect acute diastolic heart failure as culprit  -Weight at discharge last admission was 139 pounds and now she is 153 pounds  -Continue IV Lasix  40 mg IV twice a day and follow electrolyte  -Also has findings concerning for pneumonia process (abnormal CT chest /leukocytosis) so continue antibiotics, vancomycin and zosyn  -Because predominant right upper lobe finding and recent issues with recurrent delirium and deconditioning, swallow eval pending -admits to dietary indiscretion at home so nutrition eval as well as to smoking up to one month ago  Acute kidney Injury -likely secondary to lasix, will continue to monitor, will likely decrease lasix dosage  Acute delirium  -Resolved and likely due to acute hypoxemia  -Continue home medications including benzodiazepines and narcotics and Lyrica to prevent withdrawal symptoms   Lichen planus vulva  -New finding on exam last admission; [name] recommended to followup with gynecology outpatient but unfortunately has returned to the hospital for this could be accomplished  -Spoke with Dr. Ihor Dow, gyn, recommended outpt follow up  Thoracic compression fracture  -Quite unusual for a 53 year old female  -Recommend MRI to clarify so consider pursuing once respiratory  status stable   History of anxiety - chronic alprazolam  -Continue home meds   FIBROMYALGIA  -Continue home meds   History of depression, chronic antidepressants  -Continue home meds   Physical deconditioning  -PT/OT evaluation    Code Status: Full  Family Communication: None at bedside  Disposition Plan: Admitted  Time Spent in minutes   35 minutes  Procedures None  Consults  None  DVT Prophylaxis  Lovenox   Lab Results  Component Value Date   PLT 326 09/21/2013    Medications  Scheduled Meds: . diltiazem  300 mg Oral Daily  . DULoxetine  60 mg Oral BID  . enoxaparin (LOVENOX) injection  40 mg Subcutaneous Q24H  . ferrous sulfate  325 mg Oral Daily  . furosemide  40 mg Intravenous BID  . levalbuterol  0.63 mg Nebulization Q6H  . levothyroxine  150 mcg Oral QAC breakfast  . Linaclotide  145 mcg Oral Daily  . loratadine  10 mg Oral QHS  . methylPREDNISolone (SOLU-MEDROL) injection  60 mg Intravenous Q6H  . mometasone-formoterol  2 puff Inhalation BID  . pantoprazole  40 mg Oral BID  . piperacillin-tazobactam (ZOSYN)  IV  3.375 g Intravenous Q8H  . pregabalin  100 mg Oral TID  .  sodium chloride  3 mL Intravenous Q12H  . tiotropium  18 mcg Inhalation Daily  . traZODone  150 mg Oral QHS  . vancomycin  750 mg Intravenous Q12H  . vitamin B-12  5,000 mcg Oral Daily   Continuous Infusions: . sodium chloride 10 mL/hr at 09/19/13 2245   PRN Meds:.sodium chloride, acetaminophen, acetaminophen, ALPRAZolam, ondansetron (ZOFRAN) IV, ondansetron, oxyCODONE, sodium chloride  Antibiotics    Anti-infectives   Start     Dose/Rate Route Frequency Ordered Stop   09/19/13 2000  vancomycin (VANCOCIN) IVPB 750 mg/150 ml premix     750 mg 150 mL/hr over 60 Minutes Intravenous Every 12 hours 09/19/13 1838     09/19/13 1800  piperacillin-tazobactam (ZOSYN) IVPB 3.375 g     3.375 g 12.5 mL/hr over 240 Minutes Intravenous Every 8 hours 09/19/13 1648          Subjective:    [name] seen and examined today.  Patient complains of a "wet" cough but no sputum production.  She admits to not following a diet and recently stopped smoking about one month ago.    Objective:   Filed Vitals:   09/20/13 1433 09/20/13 1516 09/20/13 2116 09/21/13 0633  BP:   103/70   Pulse: 104  97   Temp:   98.6 F (37 C)   TempSrc:   Oral   Resp:   20   Height:      Weight:    68.675 kg (151 lb 6.4 oz)  SpO2: 92% 90% 95%     Wt Readings from Last 3 Encounters:  09/21/13 68.675 kg (151 lb 6.4 oz)  09/08/13 63.277 kg (139 lb 8 oz)  08/26/13 65.59 kg (144 lb 9.6 oz)     Intake/Output Summary (Last 24 hours) at 09/21/13 0858 Last data filed at 09/21/13 0500  Gross per 24 hour  Intake 1404.83 ml  Output   2050 ml  Net -645.17 ml    Exam  General: Well developed, well nourished, NAD, appears stated age  HEENT: NCAT, PERRLA, EOMI, Anicteic Sclera, mucous membranes moist. No pharyngeal erythema or exudates  Neck: Supple, no JVD, no masses  Cardiovascular: S1 S2 auscultated, no rubs, murmurs or gallops. Regular rate and rhythm.  Respiratory: Coarse breath sounds, wet sounding cough, exp wheezing   Abdomen: Soft, nontender, nondistended, + bowel sounds  Extremities: warm dry without cyanosis clubbing or edema  Neuro: AAOx3, cranial nerves grossly intact. Strength 5/5 in patient's upper and lower extremities bilaterally  Skin: Without rashes exudates or nodules  Psych: Normal affect and demeanor with intact judgement and insight, anxious  Data Review   Micro Results Recent Results (from the past 240 hour(s))  MRSA PCR SCREENING     Status: None   Collection Time    09/19/13  6:05 PM      Result Value Range Status   MRSA by PCR NEGATIVE  NEGATIVE Final   Comment:            The GeneXpert MRSA Assay (FDA     approved for NASAL specimens     only), is one component of a     comprehensive MRSA colonization     surveillance program. It is not      intended to diagnose MRSA     infection nor to guide or     monitor treatment for     MRSA infections.    Radiology Reports Dg Chest 2 View  09/19/2013   CLINICAL DATA:  Short  of breath  EXAM: CHEST  2 VIEW  COMPARISON:  DG CHEST 1V PORT dated 09/05/2013  FINDINGS: Normal cardiac silhouette. There is a fine interstitial pattern representing interstitial edema. This is similar to comparison exam. This mild basilar atelectasis. No pleural fluid. Lungs are mildly hyperinflated  IMPRESSION: Interstitial edema pattern similar prior.  Mild basilar atelectasis.   Electronically Signed   By: Suzy Bouchard M.D.   On: 09/19/2013 15:14   Ct Chest Wo Contrast  09/19/2013   CLINICAL DATA:  CHF.  EXAM: CT CHEST WITHOUT CONTRAST  TECHNIQUE: Multidetector CT imaging of the chest was performed following the standard protocol without IV contrast.  COMPARISON:  Chest x-ray 09/19/2013. CT chest 07/24/2013. Chest x-ray 07/19/2013.  FINDINGS: Thoracic aorta normal in caliber. Coronary artery disease. Heart size normal. Mild pericardial thickening.  No significant adenopathy. Thoracic esophagus unremarkable. Adrenals normal. Bilateral renal cysts. Left renal atrophy. Splenosis. Cholecystectomy.  Large airways are patent. Multifocal bilateral pulmonary infiltrates with nodularity noted. These are particularly prominent in the right upper lobe. These are most likely infectious given that these infiltrates were not present on prior recent CT of 07/24/2013. Close follow-up chest CT's suggested to demonstrate clearing. Previously identified dense infiltrate in the left lung base has partially cleared. No pleural effusion or pneumothorax. Bullous COPD.  Diffuse degenerative changes thoracic spine. New completely collapsed mid thoracic vertebral body is present. This is sclerotic and new from prior chest x-ray of 07/19/2013 . This could represent a benign or malignant compression fracture. There is mild retropulsion of fragments.   IMPRESSION: 1. Multifocal bilateral pulmonary infiltrates, particularly in the right upper lobe. This is associated with nodularity. This is new from prior chest CT of 07/24/2013 and is most likely infectious in origin ,however malignancy cannot be excluded and follow-up chest CT's are suggested to demonstrate clearing. Active granulomatous disease including TB or fungal disease cannot be excluded. 2. Previously identified dense infiltrate left lung base on CT of 07/24/2013 has partially cleared. 3. New completely collapsed mid thoracic vertebral body. This is sclerotic. This is new from prior chest x-ray 07/19/2013. This could be a benign or malignant compression fracture. There is mild retropulsion of posterior fragments. MRI may prove useful for further evaluation.   Electronically Signed   By: [place]   On: 09/19/2013 16:45   Nm Pulmonary Perf And Vent  09/19/2013   CLINICAL DATA:  Shortness of breath.  Elevated D-dimer.  EXAM: NUCLEAR MEDICINE VENTILATION - PERFUSION LUNG SCAN  TECHNIQUE: Ventilation images were obtained in multiple projections using inhaled aerosol technetium 99 M DTPA. Perfusion images were obtained in multiple projections after intravenous injection of Tc-45m MAA.  COMPARISON:  CT and plain films of the chest 09/19/2013.  RADIOPHARMACEUTICALS:  40 MCi Tc-58m DTPA aerosol and 6 mCi Tc-77m MAA  FINDINGS: Ventilation: Ventilation is markedly heterogeneous with decreased ventilation in the mid and upper lung zones bilaterally, worse on the right.  Perfusion: No wedge shaped peripheral perfusion defects to suggest acute pulmonary embolism.  IMPRESSION: Negative for pulmonary embolus.  Findings compatible with emphysema and pneumonia as seen on comparison CT.   Electronically Signed   By: Inge Rise M.D.   On: 09/19/2013 17:23   Dg Chest Port 1 View  09/05/2013   CLINICAL DATA:  Evaluate for infiltrates. Check endotracheal tube position  EXAM: PORTABLE CHEST - 1 VIEW   COMPARISON:  09/04/2013  FINDINGS: Endotracheal tube ends at the level of the lower thoracic trachea, similar to yesterday. Orogastric tube and  left IJ central venous catheter in stable position.  No appreciable change in parenchymal opacities in the lower lungs, with small left effusion. No evidence of air leak, as permitted by patient positioning.  IMPRESSION: 1. Good positioning of tubes and lines. 2. Unchanged lower lung infiltrates, superimposed on emphysema. Small left effusion.   Electronically Signed   By: Jorje Guild M.D.   On: 09/05/2013 06:22   Dg Chest Port 1 View  09/04/2013   CLINICAL DATA:  Status post intubation  EXAM: PORTABLE CHEST - 1 VIEW  COMPARISON:  09/02/2013  FINDINGS: An endotracheal tube is again seen and now lies 1.9 cm above the carina. A left central venous line is again noted within the mid superior vena cava. A nasogastric catheter is again seen coursing into the stomach. Bibasilar infiltrative changes are again seen. No new focal abnormality is noted.  IMPRESSION: Tubes and lines as described above. Bibasilar infiltrates are again seen.   Electronically Signed   By: Inez Catalina M.D.   On: 09/04/2013 21:39   Dg Chest Port 1 View  09/04/2013   CLINICAL DATA:  Pulmonary infiltrates.  EXAM: PORTABLE CHEST - 1 VIEW  COMPARISON:  09/02/2013  FINDINGS: The endotracheal tube tip now projects at the carina and may be nearly in the right mainstem bronchus. Central line positioning is stable. Bilateral lower lobe infiltrates are again present which are stable on the left and show partial clearing on the right. Probable small left pleural effusion remains.  IMPRESSION: Improvement in right lower lobe infiltrate. The left lower lobe infiltrate is relatively stable.   Electronically Signed   By: Aletta Edouard M.D.   On: 09/04/2013 09:16   Dg Chest Portable 1 View  09/02/2013   CLINICAL DATA:  Respiratory distress  EXAM: PORTABLE CHEST - 1 VIEW  COMPARISON:  September 02, 2013 11:45  a.m.  FINDINGS: Endotracheal tube is identified distal tip 2.1 cm from carina. Left jugular central venous line is noted with distal tip in the superior vena cava. Nasogastric tube is noted with distal tip not included film is the least in the stomach. There are bibasilar consolidations. The previously noted nodular opacity in the right perihilar region is not seen. The heart size is normal. The osseous structures are stable.  IMPRESSION: Persistent bibasilar consolidation. Previously noted nodular opacity in the right perihilar region not seen currently. Life supporting devices as described.   Electronically Signed   By: Abelardo Diesel M.D.   On: 09/02/2013 15:12   Dg Chest Port 1 View  09/02/2013   CLINICAL DATA:  Dyspnea and weakness  EXAM: PORTABLE CHEST - 1 VIEW  COMPARISON:  Portable chest x-ray of August 24, 2013  FINDINGS: The lungs are adequately inflated. The interstitial markings are increased bilaterally. These become nearly confluent in the right mid and lower lung. An area of subtle nodular density in the right perihilar region is demonstrated as well. The cardiopericardial silhouette is mildly enlarged. The pulmonary vascularity is not engorged. The trachea is midline. No pleural effusion is evident.  IMPRESSION: The findings are consistent with interstitial pneumonia bilaterally. The infiltrates become nearly confluent in the right lower lung. In addition there is an area of confluent density in the right perihilar region which is new. A followup PA and lateral chest x-ray following therapy is recommended to assure clearing. Chest CT scanning may be ultimately indicated to exclude any underlying nodule or mass.   Electronically Signed   By: David  [place]   On:  09/02/2013 11:59   Dg Chest Port 1 View  08/24/2013   CLINICAL DATA:  Pneumonia and weakness.  EXAM: PORTABLE CHEST - 1 VIEW  COMPARISON:  08/22/2013  FINDINGS: Improved aeration at the lung bases. There are residual patchy densities in  both lower lungs. Upper lungs remain clear with emphysematous changes. Heart size is stable. Trachea is midline. No evidence for a pneumothorax.  IMPRESSION: Improving aeration at the lung bases.  Emphysematous disease.   Electronically Signed   By: Markus Daft M.D.   On: 08/24/2013 07:41    CBC  Recent Labs Lab 09/19/13 1310 09/19/13 1724 09/20/13 0247 09/21/13 0518  WBC 14.5* 13.8* 9.1 8.5  HGB 11.3* 11.5* 10.8* 11.1*  HCT 35.4* 35.7* 33.2* 33.4*  PLT 346 374 349 326  MCV 97.8 97.5 96.2 95.4  MCH 31.2 31.4 31.3 31.7  MCHC 31.9 32.2 32.5 33.2  RDW 17.8* 17.8* 17.7* 18.0*  LYMPHSABS 1.5  --   --   --   MONOABS 1.2*  --   --   --   EOSABS 0.0  --   --   --   BASOSABS 0.1  --   --   --     Chemistries   Recent Labs Lab 09/19/13 1310 09/19/13 1724 09/20/13 0247 09/21/13 0518  NA 140  --  143 146  K 4.6  --  4.2 3.4*  CL 96  --  94* 98  CO2 30  --  34* 34*  GLUCOSE 103*  --  156* 153*  BUN 31*  --  31* 45*  CREATININE 1.44*  --  1.35* 1.62*  CALCIUM 9.4  --  8.8 8.3*  AST  --  18 20  --   ALT  --  17 14  --   ALKPHOS  --  118* 103  --   BILITOT  --  0.2* 0.2*  --    ------------------------------------------------------------------------------------------------------------------ estimated creatinine clearance is 37.7 ml/min (by C-G formula based on Cr of 1.62). ------------------------------------------------------------------------------------------------------------------ No results found for this basename: HGBA1C,  in the last 72 hours ------------------------------------------------------------------------------------------------------------------ No results found for this basename: CHOL, HDL, LDLCALC, TRIG, CHOLHDL, LDLDIRECT,  in the last 72 hours ------------------------------------------------------------------------------------------------------------------  Recent Labs  09/19/13 1724  TSH 0.623    ------------------------------------------------------------------------------------------------------------------ No results found for this basename: VITAMINB12, FOLATE, FERRITIN, TIBC, IRON, RETICCTPCT,  in the last 72 hours  Coagulation profile  Recent Labs Lab 09/19/13 1724  INR 0.82     Recent Labs  09/19/13 1724  DDIMER 0.92*    Cardiac Enzymes  Recent Labs Lab 09/19/13 1724 09/19/13 2108 09/20/13 0247  TROPONINI <0.30 <0.30 <0.30   ------------------------------------------------------------------------------------------------------------------ No components found with this basename: POCBNP,     Amere Bricco D.O. on 09/21/2013 at 8:58 AM  Between 7am to 7pm - Pager - [phone]  After 7pm go to [email or web address] - password [email or web address]  And look for the night coverage person covering for me after hours  [facility] Office  [phone]

## 2013-09-21 NOTE — Evaluation (Signed)
Clinical/Bedside Swallow Evaluation Patient Details  Name: WANIYA HOGLUND MRN: [id number] Date of Birth: July 23, 1961  Today's Date: 09/21/2013 Time: 1006-1039 SLP Time Calculation (min): 33 min  Past Medical History:  Past Medical History  Diagnosis Date  . Right-sided heart failure   . COPD (chronic obstructive pulmonary disease)   . Emphysema   . Seizures   . Thyroid disease   . Depression   . Emphysema   . Arthritis     back and legs  . Blood transfusion without reported diagnosis   . High cholesterol   . Anxiety   . Nerves   . Panic attack   . Thyroid disorder   . Hypothyroidism   . Pneumonia   . CHF (congestive heart failure)   . On home O2     4L N/C chronic  . Fibromyalgia    Past Surgical History: History reviewed. No pertinent past surgical history. HPI:  53 year old female with a history of recent COPD exacerbation discharged on 1/17, ongoing tobacco abuse, hypercarbic respiratory failure requiring BiPAP and subsequently intubation, also diagnosed with influenza A. during her last admission, treated with Tamiflu and antibiotics, chronically on 2-4 L of oxygen, discharged on 1/17 who returned today with similar complaints. Patient also appears to be acutely delirious with minimal improvement duoneb and Solu-Medrol. WBC count is increased to 14.5, from 8.5 on the day of discharge. Chest x-ray shows interstitial edema.  Patient presented today, and was tachypneic and desaturated to 85% on 4 L of oxygen which is her baseline. She appear to be short of breath and unable to speak in full sentences. Apparently she is also being about 5 pounds in the last week or so., Recent 2-D echo showed grade 1 diastolic dysfunction.   Assessment / Plan / Recommendation Clinical Impression  Patient appears to have normal oropharyngeal swallow function at bedside.  No overt s/s of aspiration, even with large consecutive swallows of thin liquids via straw.  Pt. does complain of globus  sensation with pills (feels they stick in upper esophageal area) and reports frequent reflux, especially at night, as well as belching with accompanied reflux (on protonix).  Further esophageal w/u may be beneficial, or strict reflux diet and reflux precautions.  If MD is concerned that pt. is silently aspiration during the swallow, please order MBS, otherwise, consider GI or esophageal w/u.    Aspiration Risk  Mild    Diet Recommendation  (Reflux diet)   Liquid Administration via: Cup;Straw Medication Administration: Crushed with puree (Crush or break pills in half if large) Supervision: Patient able to self feed;Intermittent supervision to cue for compensatory strategies Compensations: Slow rate;Small sips/bites;Follow solids with liquid;Multiple dry swallows after each bite/sip Postural Changes and/or Swallow Maneuvers: Seated upright 90 degrees;Upright 30-60 min after meal    Other  Recommendations Recommended Consults: Consider GI evaluation;Consider esophageal assessment Oral Care Recommendations: Oral care BID Other Recommendations: Clarify dietary restrictions (Reflux precautions)   Follow Up Recommendations  None    Frequency and Duration        Pertinent Vitals/Pain afebrile    SLP Swallow Goals  n/a    Swallow Study Prior Functional Status       General HPI: 53 year old female with a history of recent COPD exacerbation discharged on 1/17, ongoing tobacco abuse, hypercarbic respiratory failure requiring BiPAP and subsequently intubation, also diagnosed with influenza A. during her last admission, treated with Tamiflu and antibiotics, chronically on 2-4 L of oxygen, discharged on 1/17 who returned today with similar complaints.  Patient also appears to be acutely delirious with minimal improvement duoneb and Solu-Medrol. WBC count is increased to 14.5, from 8.5 on the day of discharge. Chest x-ray shows interstitial edema.  Patient presented today, and was tachypneic and  desaturated to 85% on 4 L of oxygen which is her baseline. She appear to be short of breath and unable to speak in full sentences. Apparently she is also being about 5 pounds in the last week or so., Recent 2-D echo showed grade 1 diastolic dysfunction. Type of Study: Bedside swallow evaluation Previous Swallow Assessment: Pt. had MBS in 2012.  Unable to locate results in computer.  Will check with radiology. Diet Prior to this Study: Regular;Thin liquids Temperature Spikes Noted: No Respiratory Status: Nasal cannula History of Recent Intubation: No Behavior/Cognition: Alert;Cooperative Oral Cavity - Dentition: Adequate natural dentition Self-Feeding Abilities: Able to feed self Patient Positioning: Upright in chair Baseline Vocal Quality: Clear Volitional Cough: Strong Volitional Swallow: Able to elicit    Oral/Motor/Sensory Function Overall Oral Motor/Sensory Function: Appears within functional limits for tasks assessed   Ice Chips     Thin Liquid Thin Liquid: Within functional limits Presentation: Cup;Straw    Nectar Thick     Honey Thick     Puree Puree: Within functional limits Presentation: Spoon   Solid   GO    Solid: Within functional limits Presentation: Self Loyal Buba, Mihir Flanigan T 09/21/2013,10:39 AM

## 2013-09-21 NOTE — Progress Notes (Signed)
Physical Therapy Treatment Patient Details Name: [name] MRN: [id number] DOB: 17-Feb-1961 Today's Date: 09/21/2013 Time: 1340-1403 PT Time Calculation (min): 23 min  PT Assessment / Plan / Recommendation  History of Present Illness 53 year old female with a history of recent COPD exacerbation discharged on 1/17, ongoing tobacco abuse, hypercarbic respiratory failure requiring BiPAP and subsequently intubation, also diagnosed with influenza A. during her last admission, treated with Tamiflu and antibiotics, chronically on 2-4 L of oxygen, discharged on 1/17 who returned 1/26 with similar complaints. RUL infiltrate and vertebral compression fx concerning for possible malignancy   PT Comments   Patient motivated and progressing well. Continues to desat as low as 83% on 4L with ambulation but recovers to 94% quickly with rest. Patient and spouse continue to ask about energy conservation and techniques. Continue with current POC at this time  Follow Up Recommendations  Home health PT     Does the patient have the potential to tolerate intense rehabilitation     Barriers to Discharge        Equipment Recommendations  None recommended by PT    Recommendations for Other Services    Frequency Min 3X/week   Progress towards PT Goals Progress towards PT goals: Progressing toward goals  Plan Current plan remains appropriate    Precautions / Restrictions Precautions Precautions: Fall   Pertinent Vitals/Pain no apparent distress    Mobility  Transfers Overall transfer level: Needs assistance Sit to Stand: Supervision General transfer comment: supervision for lines Ambulation/Gait Ambulation/Gait assistance: Supervision Ambulation Distance (Feet): 60 Feet Assistive device: Rolling walker (2 wheeled) Gait Pattern/deviations: Decreased stride length;Step-through pattern General Gait Details: distance and speed limited by decreased O2 sats on 4L 83-94 with gait. One seated rest break  required    Exercises     PT Diagnosis:    PT Problem List:   PT Treatment Interventions:     PT Goals (current goals can now be found in the care plan section)    Visit Information  Last PT Received On: 09/21/13 Assistance Needed: +1 History of Present Illness: 53 year old female with a history of recent COPD exacerbation discharged on 1/17, ongoing tobacco abuse, hypercarbic respiratory failure requiring BiPAP and subsequently intubation, also diagnosed with influenza A. during her last admission, treated with Tamiflu and antibiotics, chronically on 2-4 L of oxygen, discharged on 1/17 who returned 1/26 with similar complaints. RUL infiltrate and vertebral compression fx concerning for possible malignancy    Subjective Data      Cognition  Cognition Arousal/Alertness: Awake/alert Behavior During Therapy: WFL for tasks assessed/performed Overall Cognitive Status: Within Functional Limits for tasks assessed    Balance     End of Session PT - End of Session Equipment Utilized During Treatment: Gait belt;Oxygen Activity Tolerance: Patient tolerated treatment well Patient left: in chair;with call bell/phone within reach Nurse Communication: Mobility status   GP     Jacqualyn Posey 09/21/2013, 2:44 PM  09/21/2013 Jacqualyn Posey PTA [phone] pager [phone] office

## 2013-09-21 NOTE — Progress Notes (Signed)
Rehab admissions - Evaluated for possible admission.  Please see rehab consult done by Dr. Naaman Plummer recommending [facility].  Patient did well with therapies today and expect she can go home with [facility] once medically ready.  Call me for questions.  #[phone]

## 2013-09-22 DIAGNOSIS — F17200 Nicotine dependence, unspecified, uncomplicated: Secondary | ICD-10-CM

## 2013-09-22 LAB — CBC
HEMATOCRIT: 34.4 % — AB (ref 36.0–46.0)
HEMOGLOBIN: 11.1 g/dL — AB (ref 12.0–15.0)
MCH: 31 pg (ref 26.0–34.0)
MCHC: 32.3 g/dL (ref 30.0–36.0)
MCV: 96.1 fL (ref 78.0–100.0)
Platelets: 299 10*3/uL (ref 150–400)
RBC: 3.58 MIL/uL — ABNORMAL LOW (ref 3.87–5.11)
RDW: 17.6 % — AB (ref 11.5–15.5)
WBC: 7.4 10*3/uL (ref 4.0–10.5)

## 2013-09-22 LAB — BASIC METABOLIC PANEL
BUN: 43 mg/dL — AB (ref 6–23)
CO2: 31 mEq/L (ref 19–32)
Calcium: 8.1 mg/dL — ABNORMAL LOW (ref 8.4–10.5)
Chloride: 99 mEq/L (ref 96–112)
Creatinine, Ser: 1.48 mg/dL — ABNORMAL HIGH (ref 0.50–1.10)
GFR, EST AFRICAN AMERICAN: 47 mL/min — AB (ref >90)
GFR, EST NON AFRICAN AMERICAN: 40 mL/min — AB (ref >90)
GLUCOSE: 159 mg/dL — AB (ref 70–99)
Potassium: 3.1 mEq/L — ABNORMAL LOW (ref 3.7–5.3)
Sodium: 147 mEq/L (ref 137–147)

## 2013-09-22 LAB — VANCOMYCIN, TROUGH: Vancomycin Tr: 31.7 ug/mL (ref 10.0–20.0)

## 2013-09-22 MED ORDER — LEVOFLOXACIN 500 MG PO TABS: 500.0000 mg | ORAL_TABLET | Freq: Every day | ORAL | Status: DC

## 2013-09-22 MED ORDER — VANCOMYCIN HCL IN DEXTROSE 750-5 MG/150ML-% IV SOLN
750.0000 mg | INTRAVENOUS | Status: DC
  Filled 2013-09-22: qty 150

## 2013-09-22 MED ORDER — METHYLPREDNISOLONE (PAK) 4 MG PO TABS: ORAL_TABLET | ORAL | Status: DC

## 2013-09-22 MED ORDER — POTASSIUM CHLORIDE CRYS ER 20 MEQ PO TBCR
40.0000 meq | EXTENDED_RELEASE_TABLET | Freq: Two times a day (BID) | ORAL | Status: DC
  Administered 2013-09-22: 40 meq via ORAL
  Filled 2013-09-22: qty 2

## 2013-09-22 NOTE — Progress Notes (Signed)
Pt planned for d/c today. Has HH orders for PT/OT/RN at d/c. Pt is a prior patient of [facility] and wishes to remain with them for these services. [facility] rep, has been contacted and will make all arrangements.

## 2013-09-22 NOTE — Progress Notes (Signed)
ANTIBIOTIC CONSULT NOTE   Pharmacy Consult for vancomycin Indication: pneumonia  Allergies  Allergen Reactions  . Tape Itching and Rash    Patient Measurements: Height: 5\' 2"  (157.5 cm) Weight: 150 lb (68.04 kg) IBW/kg (Calculated) : 50.1   Vital Signs: Temp: 97.6 F (36.4 C) (01/29 0500) Temp src: Oral (01/29 0500) BP: 103/79 mmHg (01/29 0500) Pulse Rate: 81 (01/29 0500) Intake/Output from previous day: 01/28 0701 - 01/29 0700 In: 1240 [P.O.:480; I.V.:110; IV Piggyback:650] Out: 3000 [Urine:3000] Intake/Output from this shift: Total I/O In: -  Out: 1100 [Urine:1100]  Labs:  Recent Labs  09/20/13 0247 09/21/13 0518 09/22/13 0610  WBC 9.1 8.5 7.4  HGB 10.8* 11.1* 11.1*  PLT 349 326 299  CREATININE 1.35* 1.62* 1.48*   Estimated Creatinine Clearance: 41.1 ml/min (by C-G formula based on Cr of 1.48).  Recent Labs  09/22/13 0835  VANCOTROUGH 31.7*     Microbiology: Recent Results (from the past 720 hour(s))  URINE CULTURE     Status: None   Collection Time    09/02/13  2:58 PM      Result Value Range Status   Specimen Description URINE, RANDOM   Final   Special Requests NONE   Final   Culture  Setup Time     Final   Value: 09/02/2013 20:23     Performed at [facility] Count     Final   Value: NO GROWTH     Performed at [facility]   Culture     Final   Value: NO GROWTH     Performed at [facility]   Report Status 09/03/2013 FINAL   Final  CULTURE, BLOOD (ROUTINE X 2)     Status: None   Collection Time    09/02/13  3:00 PM      Result Value Range Status   Specimen Description BLOOD HAND RIGHT   Final   Special Requests BOTTLES DRAWN AEROBIC AND ANAEROBIC 10CC   Final   Culture  Setup Time     Final   Value: 09/02/2013 20:25     Performed at [facility]   Culture     Final   Value: NO GROWTH 5 DAYS     Performed at [facility]   Report Status 09/08/2013 FINAL   Final  CULTURE, BLOOD  (ROUTINE X 2)     Status: None   Collection Time    09/02/13  3:25 PM      Result Value Range Status   Specimen Description BLOOD ARM RIGHT   Final   Special Requests BOTTLES DRAWN AEROBIC AND ANAEROBIC 10CC   Final   Culture  Setup Time     Final   Value: 09/02/2013 20:25     Performed at [facility]   Culture     Final   Value: NO GROWTH 5 DAYS     Performed at [facility]   Report Status 09/08/2013 FINAL   Final  CULTURE, RESPIRATORY (NON-EXPECTORATED)     Status: None   Collection Time    09/02/13  4:02 PM      Result Value Range Status   Specimen Description TRACHEAL ASPIRATE   Final   Special Requests NONE   Final   Gram Stain     Final   Value: RARE WBC PRESENT, PREDOMINANTLY MONONUCLEAR     NO SQUAMOUS EPITHELIAL CELLS SEEN     ABUNDANT GRAM NEGATIVE RODS  MODERATE YEAST     Performed at [facility]   Culture     Final   Value: ABUNDANT BURKHOLDERIA CEPACIA     Performed at [facility]   Report Status 09/06/2013 FINAL   Final   Organism ID, Bacteria BURKHOLDERIA CEPACIA   Final  URINE CULTURE     Status: None   Collection Time    09/06/13  9:47 PM      Result Value Range Status   Specimen Description URINE, CATHETERIZED   Final   Special Requests NONE   Final   Culture  Setup Time     Final   Value: 09/07/2013 02:49     Performed at [facility]     Final   Value: NO GROWTH     Performed at [facility]   Culture     Final   Value: NO GROWTH     Performed at [facility]   Report Status 09/07/2013 FINAL   Final  MRSA PCR SCREENING     Status: None   Collection Time    09/19/13  6:05 PM      Result Value Range Status   MRSA by PCR NEGATIVE  NEGATIVE Final   Comment:            The GeneXpert MRSA Assay (FDA     approved for NASAL specimens     only), is one component of a     comprehensive MRSA colonization     surveillance program. It is not     intended to diagnose MRSA      infection nor to guide or     monitor treatment for     MRSA infections.    Assessment: 44 YOF with recent COPD exacerbation who is being readmitted after discharge on 1/17 with similar complaints of SOB. She was started on empiric Zosyn and vancomycin. Vanc trough 31.7 mcg/ml.  Per RN home today on po Abx  Goal of Therapy:  Vancomycin trough level 15-20 mcg/ml  Plan:  1. Change vancomycin 750mg  IV q24 2. F/u discharge plans  Lauren D. Bajbus, PharmD, BCPS Clinical Pharmacist Pager: [phone] 09/22/2013 10:04 AM

## 2013-09-22 NOTE — Discharge Instructions (Signed)
Heart Failure °Heart failure is a condition in which the heart has trouble pumping blood. This means your heart does not pump blood efficiently for your body to work well. In some cases of heart failure, fluid may back up into your lungs or you may have swelling (edema) in your lower legs. Heart failure is usually a long-term (chronic) condition. It is important for you to take good care of yourself and follow your caregiver's treatment plan. °CAUSES  °Some health conditions can cause heart failure. Those health conditions include: °· High blood pressure (hypertension) causes the heart muscle to work harder than normal. When pressure in the blood vessels is high, the heart needs to pump (contract) with more force in order to circulate blood throughout the body. High blood pressure eventually causes the heart to become stiff and weak. °· Coronary artery disease (CAD) is the buildup of cholesterol and fat (plaque) in the arteries of the heart. The blockage in the arteries deprives the heart muscle of oxygen and blood. This can cause chest pain and may lead to a heart attack. High blood pressure can also contribute to CAD. °· Heart attack (myocardial infarction) occurs when 1 or more arteries in the heart become blocked. The loss of oxygen damages the muscle tissue of the heart. When this happens, part of the heart muscle dies. The injured tissue does not contract as well and weakens the heart's ability to pump blood. °· Abnormal heart valves can cause heart failure when the heart valves do not open and close properly. This makes the heart muscle pump harder to keep the blood flowing. °· Heart muscle disease (cardiomyopathy or myocarditis) is damage to the heart muscle from a variety of causes. These can include drug or alcohol abuse, infections, or unknown reasons. These can increase the risk of heart failure. °· Lung disease makes the heart work harder because the lungs do not work properly. This can cause a strain  on the heart, leading it to fail. °· Diabetes increases the risk of heart failure. High blood sugar contributes to high fat (lipid) levels in the blood. Diabetes can also cause slow damage to tiny blood vessels that carry important nutrients to the heart muscle. When the heart does not get enough oxygen and food, it can cause the heart to become weak and stiff. This leads to a heart that does not contract efficiently. °· Other conditions can contribute to heart failure. These include abnormal heart rhythms, thyroid problems, and low blood counts (anemia). °Certain unhealthy behaviors can increase the risk of heart failure. Those unhealthy behaviors include: °· Being overweight. °· Smoking or chewing tobacco. °· Eating foods high in fat and cholesterol. °· Abusing illicit drugs or alcohol. °· Lacking physical activity. °SYMPTOMS  °Heart failure symptoms may vary and can be hard to detect. Symptoms may include: °· Shortness of breath with activity, such as climbing stairs. °· Persistent cough. °· Swelling of the feet, ankles, legs, or abdomen. °· Unexplained weight gain. °· Difficulty breathing when lying flat (orthopnea). °· Waking from sleep because of the need to sit up and get more air. °· Rapid heartbeat. °· Fatigue and loss of energy. °· Feeling lightheaded, dizzy, or close to fainting. °· Loss of appetite. °· Nausea. °· Increased urination during the night (nocturia). °DIAGNOSIS  °A diagnosis of heart failure is based on your history, symptoms, physical examination, and diagnostic tests. °Diagnostic tests for heart failure may include: °· Echocardiography. °· Electrocardiography. °· Chest X-ray. °· Blood tests. °· Exercise   stress test. °· Cardiac angiography. °· Radionuclide scans. °TREATMENT  °Treatment is aimed at managing the symptoms of heart failure. Medicines, behavioral changes, or surgical intervention may be necessary to treat heart failure. °· Medicines to help treat heart failure may  include: °· Angiotensin-converting enzyme (ACE) inhibitors. This type of medicine blocks the effects of a blood protein called angiotensin-converting enzyme. ACE inhibitors relax (dilate) the blood vessels and help lower blood pressure. °· Angiotensin receptor blockers. This type of medicine blocks the actions of a blood protein called angiotensin. Angiotensin receptor blockers dilate the blood vessels and help lower blood pressure. °· Water pills (diuretics). Diuretics cause the kidneys to remove salt and water from the blood. The extra fluid is removed through urination. This loss of extra fluid lowers the volume of blood the heart pumps. °· Beta blockers. These prevent the heart from beating too fast and improve heart muscle strength. °· Digitalis. This increases the force of the heartbeat. °· Healthy behavior changes include: °· Obtaining and maintaining a healthy weight. °· Stopping smoking or chewing tobacco. °· Eating heart healthy foods. °· Limiting or avoiding alcohol. °· Stopping illicit drug use. °· Physical activity as directed by your caregiver. °· Surgical treatment for heart failure may include: °· A procedure to open blocked arteries, repair damaged heart valves, or remove damaged heart muscle tissue. °· A pacemaker to improve heart muscle function and control certain abnormal heart rhythms. °· An internal cardioverter defibrillator to treat certain serious abnormal heart rhythms. °· A left ventricular assist device to assist the pumping ability of the heart. °HOME CARE INSTRUCTIONS  °· Take your medicine as directed by your caregiver. Medicines are important in reducing the workload of your heart, slowing the progression of heart failure, and improving your symptoms. °· Do not stop taking your medicine unless directed by your caregiver. °· Do not skip any dose of medicine. °· Refill your prescriptions before you run out of medicine. Your medicines are needed every day. °· Take over-the-counter  medicine only as directed by your caregiver or pharmacist. °· Engage in moderate physical activity if directed by your caregiver. Moderate physical activity can benefit some people. The elderly and people with severe heart failure should consult with a caregiver for physical activity recommendations. °· Eat heart healthy foods. Food choices should be free of trans fat and low in saturated fat, cholesterol, and salt (sodium). Healthy choices include fresh or frozen fruits and vegetables, fish, lean meats, legumes, fat-free or low-fat dairy products, and whole grain or high fiber foods. Talk to a dietitian to learn more about heart healthy foods. °· Limit sodium if directed by your caregiver. Sodium restriction may reduce symptoms of heart failure in some people. Talk to a dietitian to learn more about heart healthy seasonings. °· Use healthy cooking methods. Healthy cooking methods include roasting, grilling, broiling, baking, poaching, steaming, or stir-frying. Talk to a dietitian to learn more about healthy cooking methods. °· Limit fluids if directed by your caregiver. Fluid restriction may reduce symptoms of heart failure in some people. °· Weigh yourself every day. Daily weights are important in the early recognition of excess fluid. You should weigh yourself every morning after you urinate and before you eat breakfast. Wear the same amount of clothing each time you weigh yourself. Record your daily weight. Provide your caregiver with your weight record. °· Monitor and record your blood pressure if directed by your caregiver. °· Check your pulse if directed by your caregiver. °· Lose weight if directed   by your caregiver. Weight loss may reduce symptoms of heart failure in some people. °· Stop smoking or chewing tobacco. Nicotine makes your heart work harder by causing your blood vessels to constrict. Do not use nicotine gum or patches before talking to your caregiver. °· Schedule and attend follow-up visits as  directed by your caregiver. It is important to keep all your appointments. °· Limit alcohol intake to no more than 1 drink per day for nonpregnant women and 2 drinks per day for men. Drinking more than that is harmful to your heart. Tell your caregiver if you drink alcohol several times a week. Talk with your caregiver about whether alcohol is safe for you. If your heart has already been damaged by alcohol or you have severe heart failure, drinking alcohol should be stopped completely. °· Stop illicit drug use. °· Stay up-to-date with immunizations. It is especially important to prevent respiratory infections through current pneumococcal and influenza immunizations. °· Manage other health conditions such as hypertension, diabetes, thyroid disease, or abnormal heart rhythms as directed by your caregiver. °· Learn to manage stress. °· Plan rest periods when fatigued. °· Learn strategies to manage high temperatures. If the weather is extremely hot: °· Avoid vigorous physical activity. °· Use air conditioning or fans or seek a cooler location. °· Avoid caffeine and alcohol. °· Wear loose-fitting, lightweight, and light-colored clothing. °· Learn strategies to manage cold temperatures. If the weather is extremely cold: °· Avoid vigorous physical activity. °· Layer clothes. °· Wear mittens or gloves, a hat, and a scarf when going outside. °· Avoid alcohol. °· Obtain ongoing education and support as needed. °· Participate or seek rehabilitation as needed to maintain or improve independence and quality of life. °SEEK MEDICAL CARE IF:  °· Your weight increases by 03 lb/1.4 kg in 1 day or 05 lb/2.3 kg in a week. °· You have increasing shortness of breath that is unusual for you. °· You are unable to participate in your usual physical activities. °· You tire easily. °· You cough more than normal, especially with physical activity. °· You have any or more swelling in areas such as your hands, feet, ankles, or abdomen. °· You  are unable to sleep because it is hard to breathe. °· You feel like your heart is beating fast (palpitations). °· You become dizzy or lightheaded upon standing up. °SEEK IMMEDIATE MEDICAL CARE IF:  °· You have difficulty breathing. °· There is a change in mental status such as decreased alertness or difficulty with concentration. °· You have a pain or discomfort in your chest. °· You have an episode of fainting (syncope). °MAKE SURE YOU:  °· Understand these instructions. °· Will watch your condition. °· Will get help right away if you are not doing well or get worse. °Document Released: 08/11/2005 Document Revised: 12/06/2012 Document Reviewed: 09/02/2012 °[employer]® Patient Information ©2014 [employer]. ° °

## 2013-09-22 NOTE — Discharge Summary (Signed)
Physician Discharge Summary  [name] [id number] DOB: Apr 22, 1961 DOA: 09/19/2013  PCP: [name], NP  Admit date: 09/19/2013 Discharge date: 09/22/2013  Time spent: 35 minutes  Recommendations for Outpatient Follow-up:  Patient will be discharged to home with home health. She is to followup with her primary care physician within one week of discharge. She should continue taking her medications as prescribed. Patient also need to follow up with gynecology. Patient should follow a low-sodium heart healthy diet, 1200 mL fluid restriction per day. She should refrain from smoking. Patient will need to have her BMP to be monitored.  Discharge Diagnoses:  Acute on chronic respiratory failure, improved COPD without bronchospasm Chronic diastolic congestive heart failure, NYHA class I Right upper lobe pneumonia with infiltrate Acute kidney injury,  Improving Acute delirium, resolved Like and plan a suitable for, will need followup Thoracic compression fracture, will need outpatient followup History of anxiety, stable Fibromyalgia, stable History depression, stable  Physical deconditioning, will continue physical therapy and occupational therapy  Discharge Condition: Stable  Diet recommendation: Heart healthy, 1200 mL per day fluid restriction  Filed Weights   09/19/13 1234 09/21/13 0633 09/22/13 0500  Weight: 69.582 kg (153 lb 6.4 oz) 68.675 kg (151 lb 6.4 oz) 68.04 kg (150 lb)    History of present illness:  53 year old female with a history of recent COPD exacerbation discharged on 1/17, ongoing tobacco abuse, hypercarbic respiratory failure requiring BiPAP and subsequently intubation, also diagnosed with influenza A. during her last admission, treated with Tamiflu and antibiotics, chronically on 2-4 L of oxygen, discharged on 1/17 who returned today with similar complaints. Patient also appears to be acutely delirious with minimal improvement duoneb and Solu-Medrol. WBC  count is increased to 14.5, from 8.5 on the day of discharge. Chest x-ray shows interstitial edema.  Patient presented and was tachypneic and desaturated to 85% on 4 L of oxygen which is her baseline. She appear to be short of breath and unable to speak in full sentences. Apparently she is also being about 5 pounds in the last week or so., Recent 2-D echo showed grade 1 diastolic dysfunction.   Hospital Course:  Acute-on-chronic respiratory failure:  A) COPD without acute bronchospasm  B) Chronic diastolic congestive heart failure, NYHA class 1  C) Right upper lobe pneumonia/infiltrate  -Baseline 3 L per minute (uses up to 4L at home)  -COPD appears compensated  -Suspect acute diastolic heart failure as culprit  -Weight at discharge last admission was 139 pounds and now she is 153 pounds  -Patient was placed on IV Lasix, she may resume her home dose of Lasix. -Also has findings concerning for pneumonia process (abnormal CT chest /leukocytosis)- was placed on vancomycin and cefepime. Patient will be discharged with Levaquin as well as a steroid taper. -Because predominant right upper lobe finding and recent issues with recurrent delirium and deconditioning -admits to dietary indiscretion at home so nutrition eval as well as to smoking up to one month ago   Acute kidney Injury  -likely secondary to lasix -Improving, however will need outpatient monitoring and followup.  Acute delirium  -Resolved and likely due to acute hypoxemia  -Continue home medications including benzodiazepines and narcotics and Lyrica to prevent withdrawal symptoms   Lichen planus vulva  -New finding on exam last admission; [place] recommended to followup with gynecology outpatient but unfortunately has returned to the hospital for this could be accomplished  -Spoke with Dr. Ihor Dow, gyn, recommended outpt follow up   Thoracic compression fracture  -  Quite unusual for a 53 year old female  -Recommend MRI to  clarify so consider pursuing once respiratory status stable   History of anxiety - chronic alprazolam  -Continue home meds   FIBROMYALGIA  -Continue home meds   History of depression, chronic antidepressants  -Continue home meds   Physical deconditioning  -PT/OT evaluation    Procedures: None  Consultations: Gynecology via phone  Discharge Exam: Filed Vitals:   09/22/13 0500  BP: 103/79  Pulse: 81  Temp: 97.6 F (36.4 C)  Resp: 16   Exam  General: Well developed, well nourished, NAD, appears stated age  HEENT: NCAT, PERRLA, EOMI, Anicteic Sclera, mucous membranes moist. No pharyngeal erythema or exudates  Neck: Supple, no JVD, no masses  Cardiovascular: S1 S2 auscultated, no rubs, murmurs or gallops. Regular rate and rhythm.  Respiratory: Coarse breath sounds, wet sounding cough, exp wheezing  Abdomen: Soft, nontender, nondistended, + bowel sounds  Extremities: warm dry without cyanosis clubbing or edema  Neuro: AAOx3, cranial nerves grossly intact. Strength 5/5 in patient's upper and lower extremities bilaterally  Skin: Without rashes exudates or nodules  Psych: Normal affect and demeanor with intact judgement and insight, anxious   Discharge Instructions  Discharge Orders   Future Appointments Provider Department Dept Phone   10/03/2013 11:45 AM Candee Furbish, MD [place] [phone]   Future Orders Complete By Expires   ([place]) Call MD:  Anytime you have any of the following symptoms: 1) 3 pound weight gain in 24 hours or 5 pounds in 1 week 2) shortness of breath, with or without a dry hacking cough 3) swelling in the hands, feet or stomach 4) if you have to sleep on extra pillows at night in order to breathe.  As directed    Diet - low sodium heart healthy  As directed    Discharge instructions  As directed    Comments:     Patient will be discharged to home with home health. She is to followup with her primary care physician  within one week of discharge. She should continue taking her medications as prescribed. Patient also need to follow up with gynecology. Patient should follow a low-sodium heart healthy diet, 1200 mL fluid restriction per day. She should refrain from smoking.   Increase activity slowly  As directed        Medication List         ALPRAZolam 0.5 MG tablet  Commonly known as:  XANAX  Take 0.5 mg by mouth 2 (two) times daily as needed for anxiety.     CALCIUM 600 + D PO  Take 1 tablet by mouth 2 (two) times daily.     diltiazem 300 MG 24 hr capsule  Commonly known as:  TIAZAC  Take 300 mg by mouth daily.     DULoxetine 60 MG capsule  Commonly known as:  CYMBALTA  Take 60 mg by mouth 2 (two) times daily.     ferrous sulfate 325 (65 FE) MG EC tablet  Take 325 mg by mouth daily.     Fluticasone-Salmeterol 250-50 MCG/DOSE Aepb  Commonly known as:  ADVAIR  Inhale 1 puff into the lungs every 12 (twelve) hours.     furosemide 40 MG tablet  Commonly known as:  LASIX  Take 40 mg by mouth daily.     levofloxacin 500 MG tablet  Commonly known as:  LEVAQUIN  Take 1 tablet (500 mg total) by mouth daily.     levothyroxine 150  MCG tablet  Commonly known as:  SYNTHROID, LEVOTHROID  Take 150 mcg by mouth daily.     LINZESS 145 MCG Caps capsule  Generic drug:  Linaclotide  Take 145 mcg by mouth daily.     loratadine 10 MG tablet  Commonly known as:  CLARITIN  Take 10 mg by mouth at bedtime.     methylPREDNIsolone 4 MG tablet  Commonly known as:  MEDROL DOSPACK  follow package directions     Oxycodone HCl 10 MG Tabs  Take 0.5 tablets (5 mg total) by mouth every 6 (six) hours as needed (for pain).     pantoprazole 40 MG tablet  Commonly known as:  PROTONIX  Take 1 tablet (40 mg total) by mouth 2 (two) times daily.     potassium chloride SA 20 MEQ tablet  Commonly known as:  K-DUR,KLOR-CON  Take 20 mEq by mouth daily.     pregabalin 100 MG capsule  Commonly known as:  LYRICA    Take 100 mg by mouth 3 (three) times daily.     tiotropium 18 MCG inhalation capsule  Commonly known as:  SPIRIVA  Place 18 mcg into inhaler and inhale daily.     traZODone 150 MG tablet  Commonly known as:  DESYREL  Take 150 mg by mouth at bedtime.     vitamin B-12 1000 MCG tablet  Commonly known as:  CYANOCOBALAMIN  Take 5,000 mcg by mouth daily.       Allergies  Allergen Reactions  . Tape Itching and Rash       Follow-up Information   Follow up with [name], NP. Schedule an appointment as soon as possible for a visit in 1 week.   Contact information:   [address] [place] [address] [phone]       Follow up with Lavonia Drafts, MD. Schedule an appointment as soon as possible for a visit in 2 weeks.   Specialty:  Obstetrics and Gynecology   Contact information:   [place] [address] [phone]        The results of significant diagnostics from this hospitalization (including imaging, microbiology, ancillary and laboratory) are listed below for reference.    Significant Diagnostic Studies: Dg Chest 2 View  09/19/2013   CLINICAL DATA:  Short of breath  EXAM: CHEST  2 VIEW  COMPARISON:  DG CHEST 1V PORT dated 09/05/2013  FINDINGS: Normal cardiac silhouette. There is a fine interstitial pattern representing interstitial edema. This is similar to comparison exam. This mild basilar atelectasis. No pleural fluid. Lungs are mildly hyperinflated  IMPRESSION: Interstitial edema pattern similar prior.  Mild basilar atelectasis.   Electronically Signed   By: Suzy Bouchard M.D.   On: 09/19/2013 15:14   Ct Chest Wo Contrast  09/19/2013   CLINICAL DATA:  CHF.  EXAM: CT CHEST WITHOUT CONTRAST  TECHNIQUE: Multidetector CT imaging of the chest was performed following the standard protocol without IV contrast.  COMPARISON:  Chest x-ray 09/19/2013. CT chest 07/24/2013. Chest x-ray 07/19/2013.  FINDINGS: Thoracic aorta normal in  caliber. Coronary artery disease. Heart size normal. Mild pericardial thickening.  No significant adenopathy. Thoracic esophagus unremarkable. Adrenals normal. Bilateral renal cysts. Left renal atrophy. Splenosis. Cholecystectomy.  Large airways are patent. Multifocal bilateral pulmonary infiltrates with nodularity noted. These are particularly prominent in the right upper lobe. These are most likely infectious given that these infiltrates were not present on prior recent CT of 07/24/2013. Close follow-up chest CT's suggested to  demonstrate clearing. Previously identified dense infiltrate in the left lung base has partially cleared. No pleural effusion or pneumothorax. Bullous COPD.  Diffuse degenerative changes thoracic spine. New completely collapsed mid thoracic vertebral body is present. This is sclerotic and new from prior chest x-ray of 07/19/2013 . This could represent a benign or malignant compression fracture. There is mild retropulsion of fragments.  IMPRESSION: 1. Multifocal bilateral pulmonary infiltrates, particularly in the right upper lobe. This is associated with nodularity. This is new from prior chest CT of 07/24/2013 and is most likely infectious in origin ,however malignancy cannot be excluded and follow-up chest CT's are suggested to demonstrate clearing. Active granulomatous disease including TB or fungal disease cannot be excluded. 2. Previously identified dense infiltrate left lung base on CT of 07/24/2013 has partially cleared. 3. New completely collapsed mid thoracic vertebral body. This is sclerotic. This is new from prior chest x-ray 07/19/2013. This could be a benign or malignant compression fracture. There is mild retropulsion of posterior fragments. MRI may prove useful for further evaluation.   Electronically Signed   By: Soldier   On: 09/19/2013 16:45   Nm Pulmonary Perf And Vent  09/19/2013   CLINICAL DATA:  Shortness of breath.  Elevated D-dimer.  EXAM: NUCLEAR MEDICINE  VENTILATION - PERFUSION LUNG SCAN  TECHNIQUE: Ventilation images were obtained in multiple projections using inhaled aerosol technetium 99 M DTPA. Perfusion images were obtained in multiple projections after intravenous injection of Tc-21m MAA.  COMPARISON:  CT and plain films of the chest 09/19/2013.  RADIOPHARMACEUTICALS:  40 MCi Tc-60m DTPA aerosol and 6 mCi Tc-85m MAA  FINDINGS: Ventilation: Ventilation is markedly heterogeneous with decreased ventilation in the mid and upper lung zones bilaterally, worse on the right.  Perfusion: No wedge shaped peripheral perfusion defects to suggest acute pulmonary embolism.  IMPRESSION: Negative for pulmonary embolus.  Findings compatible with emphysema and pneumonia as seen on comparison CT.   Electronically Signed   By: Inge Rise M.D.   On: 09/19/2013 17:23   Dg Chest Port 1 View  09/05/2013   CLINICAL DATA:  Evaluate for infiltrates. Check endotracheal tube position  EXAM: PORTABLE CHEST - 1 VIEW  COMPARISON:  09/04/2013  FINDINGS: Endotracheal tube ends at the level of the lower thoracic trachea, similar to yesterday. Orogastric tube and left IJ central venous catheter in stable position.  No appreciable change in parenchymal opacities in the lower lungs, with small left effusion. No evidence of air leak, as permitted by patient positioning.  IMPRESSION: 1. Good positioning of tubes and lines. 2. Unchanged lower lung infiltrates, superimposed on emphysema. Small left effusion.   Electronically Signed   By: Jorje Guild M.D.   On: 09/05/2013 06:22   Dg Chest Port 1 View  09/04/2013   CLINICAL DATA:  Status post intubation  EXAM: PORTABLE CHEST - 1 VIEW  COMPARISON:  09/02/2013  FINDINGS: An endotracheal tube is again seen and now lies 1.9 cm above the carina. A left central venous line is again noted within the mid superior vena cava. A nasogastric catheter is again seen coursing into the stomach. Bibasilar infiltrative changes are again seen. No new focal  abnormality is noted.  IMPRESSION: Tubes and lines as described above. Bibasilar infiltrates are again seen.   Electronically Signed   By: Inez Catalina M.D.   On: 09/04/2013 21:39   Dg Chest Port 1 View  09/04/2013   CLINICAL DATA:  Pulmonary infiltrates.  EXAM: PORTABLE CHEST - 1 VIEW  COMPARISON:  09/02/2013  FINDINGS: The endotracheal tube tip now projects at the carina and may be nearly in the right mainstem bronchus. Central line positioning is stable. Bilateral lower lobe infiltrates are again present which are stable on the left and show partial clearing on the right. Probable small left pleural effusion remains.  IMPRESSION: Improvement in right lower lobe infiltrate. The left lower lobe infiltrate is relatively stable.   Electronically Signed   By: Aletta Edouard M.D.   On: 09/04/2013 09:16   Dg Chest Portable 1 View  09/02/2013   CLINICAL DATA:  Respiratory distress  EXAM: PORTABLE CHEST - 1 VIEW  COMPARISON:  September 02, 2013 11:45 a.m.  FINDINGS: Endotracheal tube is identified distal tip 2.1 cm from carina. Left jugular central venous line is noted with distal tip in the superior vena cava. Nasogastric tube is noted with distal tip not included film is the least in the stomach. There are bibasilar consolidations. The previously noted nodular opacity in the right perihilar region is not seen. The heart size is normal. The osseous structures are stable.  IMPRESSION: Persistent bibasilar consolidation. Previously noted nodular opacity in the right perihilar region not seen currently. Life supporting devices as described.   Electronically Signed   By: Abelardo Diesel M.D.   On: 09/02/2013 15:12   Dg Chest Port 1 View  09/02/2013   CLINICAL DATA:  Dyspnea and weakness  EXAM: PORTABLE CHEST - 1 VIEW  COMPARISON:  Portable chest x-ray of August 24, 2013  FINDINGS: The lungs are adequately inflated. The interstitial markings are increased bilaterally. These become nearly confluent in the right mid and  lower lung. An area of subtle nodular density in the right perihilar region is demonstrated as well. The cardiopericardial silhouette is mildly enlarged. The pulmonary vascularity is not engorged. The trachea is midline. No pleural effusion is evident.  IMPRESSION: The findings are consistent with interstitial pneumonia bilaterally. The infiltrates become nearly confluent in the right lower lung. In addition there is an area of confluent density in the right perihilar region which is new. A followup PA and lateral chest x-ray following therapy is recommended to assure clearing. Chest CT scanning may be ultimately indicated to exclude any underlying nodule or mass.   Electronically Signed   By: David  Martinique   On: 09/02/2013 11:59   Dg Chest Port 1 View  08/24/2013   CLINICAL DATA:  Pneumonia and weakness.  EXAM: PORTABLE CHEST - 1 VIEW  COMPARISON:  08/22/2013  FINDINGS: Improved aeration at the lung bases. There are residual patchy densities in both lower lungs. Upper lungs remain clear with emphysematous changes. Heart size is stable. Trachea is midline. No evidence for a pneumothorax.  IMPRESSION: Improving aeration at the lung bases.  Emphysematous disease.   Electronically Signed   By: Markus Daft M.D.   On: 08/24/2013 07:41    Microbiology: Recent Results (from the past 240 hour(s))  MRSA PCR SCREENING     Status: None   Collection Time    09/19/13  6:05 PM      Result Value Range Status   MRSA by PCR NEGATIVE  NEGATIVE Final   Comment:            The GeneXpert MRSA Assay (FDA     approved for NASAL specimens     only), is one component of a     comprehensive MRSA colonization     surveillance program. It is not     intended to  diagnose MRSA     infection nor to guide or     monitor treatment for     MRSA infections.     Labs: Basic Metabolic Panel:  Recent Labs Lab 09/19/13 1310 09/20/13 0247 09/21/13 0518 09/22/13 0610  NA 140 143 146 147  K 4.6 4.2 3.4* 3.1*  CL 96 94* 98  99  CO2 30 34* 34* 31  GLUCOSE 103* 156* 153* 159*  BUN 31* 31* 45* 43*  CREATININE 1.44* 1.35* 1.62* 1.48*  CALCIUM 9.4 8.8 8.3* 8.1*   Liver Function Tests:  Recent Labs Lab 09/19/13 1724 09/20/13 0247  AST 18 20  ALT 17 14  ALKPHOS 118* 103  BILITOT 0.2* 0.2*  PROT 7.5 6.8  ALBUMIN 3.2* 2.8*   No results found for this basename: LIPASE, AMYLASE,  in the last 168 hours No results found for this basename: AMMONIA,  in the last 168 hours CBC:  Recent Labs Lab 09/19/13 1310 09/19/13 1724 09/20/13 0247 09/21/13 0518 09/22/13 0610  WBC 14.5* 13.8* 9.1 8.5 7.4  NEUTROABS 11.7*  --   --   --   --   HGB 11.3* 11.5* 10.8* 11.1* 11.1*  HCT 35.4* 35.7* 33.2* 33.4* 34.4*  MCV 97.8 97.5 96.2 95.4 96.1  PLT 346 374 349 326 299   Cardiac Enzymes:  Recent Labs Lab 09/19/13 1310 09/19/13 1724 09/19/13 2108 09/20/13 0247  TROPONINI <0.30 <0.30 <0.30 <0.30   BNP: BNP (last 3 results)  Recent Labs  08/20/13 1926 09/02/13 1145 09/19/13 1310  PROBNP 736.3* 622.9* 506.1*   CBG: No results found for this basename: GLUCAP,  in the last 168 hours     Signed:  Cristal Ford  [facility] 09/22/2013, 11:03 AM

## 2013-09-29 ENCOUNTER — Ambulatory Visit (INDEPENDENT_AMBULATORY_CARE_PROVIDER_SITE_OTHER): Payer: Medicare Other | Admitting: Family Medicine

## 2013-09-29 ENCOUNTER — Encounter: Payer: Self-pay | Admitting: Family Medicine

## 2013-09-29 VITALS — BP 112/71 | HR 109 | Temp 98.8°F | Ht 61.0 in | Wt 145.5 lb

## 2013-09-29 DIAGNOSIS — N898 Other specified noninflammatory disorders of vagina: Secondary | ICD-10-CM

## 2013-09-29 NOTE — Progress Notes (Signed)
S: [name] is a 53 year old female with a history of cervical cancer resulting in a total hysterectomy. She presents to the clinic today with complaints of vaginal irritation and pain x 1 month. She first noticed the pain after a nurse mentioned she had a lesion that appeared like a "cauliflower" in her vaginal area during her stay in the ICU. Since then, she has noticed pain when touching the area and while sitting in certain positions. She denies trauma, pruritus, bleeding, discharge, or foul odor.     O:  Filed Vitals:   09/29/13 1313  BP: 112/71  Pulse: 109  Temp: 98.8 F (37.1 C)   Vaginal exam: Pt with thick crusted discharge on right labia. Attempted removal. Able to remove some discharge. But too painful to fully evaluate.  A/P: 53 y.o. F with multiple medical problems with likely solidified vaginal discharge. Recommended sitz baths and to clean at home. Return in 2 weeks for reevaluation after cleaned off. Consider bx at that time.  Fredrik Rigger, MD OB Fellow

## 2013-10-03 ENCOUNTER — Ambulatory Visit (INDEPENDENT_AMBULATORY_CARE_PROVIDER_SITE_OTHER): Payer: Medicare Other | Admitting: Cardiology

## 2013-10-03 ENCOUNTER — Encounter: Payer: Self-pay | Admitting: Cardiology

## 2013-10-03 VITALS — BP 120/74 | HR 111 | Ht 61.0 in | Wt 151.0 lb

## 2013-10-03 DIAGNOSIS — R0609 Other forms of dyspnea: Secondary | ICD-10-CM

## 2013-10-03 DIAGNOSIS — R0989 Other specified symptoms and signs involving the circulatory and respiratory systems: Secondary | ICD-10-CM

## 2013-10-03 DIAGNOSIS — R079 Chest pain, unspecified: Secondary | ICD-10-CM

## 2013-10-03 DIAGNOSIS — F17200 Nicotine dependence, unspecified, uncomplicated: Secondary | ICD-10-CM

## 2013-10-03 DIAGNOSIS — Z01812 Encounter for preprocedural laboratory examination: Secondary | ICD-10-CM

## 2013-10-03 DIAGNOSIS — I2510 Atherosclerotic heart disease of native coronary artery without angina pectoris: Secondary | ICD-10-CM

## 2013-10-03 DIAGNOSIS — J449 Chronic obstructive pulmonary disease, unspecified: Secondary | ICD-10-CM

## 2013-10-03 DIAGNOSIS — R0902 Hypoxemia: Secondary | ICD-10-CM

## 2013-10-03 DIAGNOSIS — R0600 Dyspnea, unspecified: Secondary | ICD-10-CM

## 2013-10-03 NOTE — Progress Notes (Addendum)
    [address] [place], [place]  [address] Phone: [phone] Fax:  [phone]  Date:  10/03/2013   ID:  [name], DOB 06/29/1961, MRN [id number]  PCP:  YORK,REGINA F, NP   History of Present Illness: [name] is a 53 y.o. female was in hospital twice in 1/15 . Severe COPD exacerbation/pneumonia. BiPap. ECHO in December. CXR showed PNA. CT scan showed CAD (cornary calcification). Saw Dr. In [place] who did not clear her previously for surgery said she was not getting enough oxygen to her heart. No prior MI. Has had prior endoscopy and hip surgery without difficultly after she was told that she was too high risk for surgery by previous cardiologist. Dr. Ravencamp she says.   She is currently recovering from her hospitalization. She is being seen today at the request of her primary practitioner to discover if further action needs to be taken on her coronary artery calcification in her overall general cardiac health.  She currently has pain with coughing, chronic shortness of breath. Worse with exertional change.  Her medical records were reviewed. 1/26-29 with COPD/HF. She is seeing a pulmonologist in [place].  She is very concerned that she may have severe coronary artery disease and this is a distinct possibility for her given her underlying COPD, ongoing smoking. She does have some exertional discomfort/change which is likely musculoskeletal/COPD related however. I do not believe that it is prudent to proceed with stress test with her underlying lung condition and I would like to proceed with diagnostic cardiac catheterization to ensure that she does not have triple-vessel coronary artery disease. If she did, she would be at increased risk for surgery given her underlying lung condition.      Wt Readings from Last 3 Encounters:  10/03/13 151 lb (68.493 kg)  09/29/13 145 lb 8 oz (65.998 kg)  09/22/13 150 lb (68.04 kg)     Past Medical History    Diagnosis Date  . Right-sided heart failure   . COPD (chronic obstructive pulmonary disease)   . Emphysema   . Seizures   . Thyroid disease   . Depression   . Emphysema   . Arthritis     back and legs  . Blood transfusion without reported diagnosis   . High cholesterol   . Anxiety   . Nerves   . Panic attack   . Thyroid disorder   . Hypothyroidism   . Pneumonia   . CHF (congestive heart failure)   . On home O2     4L N/C chronic  . Fibromyalgia     Past Surgical History  Procedure Laterality Date  . Abdominal hysterectomy    . Cesarean section    . Cholecystectomy    . Laser removal condyloma      Current Outpatient Prescriptions  Medication Sig Dispense Refill  . ALPRAZolam (XANAX) 0.5 MG tablet Take 0.5 mg by mouth 2 (two) times daily as needed for anxiety.      . Calcium Carb-Cholecalciferol (CALCIUM 600 + D PO) Take 1 tablet by mouth 2 (two) times daily.      . diltiazem (TIAZAC) 300 MG 24 hr capsule Take 300 mg by mouth daily.      . DULoxetine (CYMBALTA) 60 MG capsule Take 60 mg by mouth 2 (two) times daily.      . ferrous sulfate 325 (65 FE) MG EC tablet Take 325 mg by mouth daily.      .   Fluticasone-Salmeterol (ADVAIR) 250-50 MCG/DOSE AEPB Inhale 1 puff into the lungs every 12 (twelve) hours.      . furosemide (LASIX) 40 MG tablet Take 40 mg by mouth daily.      Marland Kitchen levothyroxine (SYNTHROID, LEVOTHROID) 150 MCG tablet Take 150 mcg by mouth daily.      . Linaclotide (LINZESS) 145 MCG CAPS capsule Take 145 mcg by mouth daily.      Marland Kitchen loratadine (CLARITIN) 10 MG tablet Take 10 mg by mouth at bedtime.      . Oxycodone HCl 10 MG TABS Take 0.5 tablets (5 mg total) by mouth every 6 (six) hours as needed (for pain).    0  . pantoprazole (PROTONIX) 40 MG tablet Take 1 tablet (40 mg total) by mouth 2 (two) times daily.  60 tablet  2  . potassium chloride SA (K-DUR,KLOR-CON) 20 MEQ tablet Take 20 mEq by mouth daily.      . pregabalin (LYRICA) 100 MG capsule Take 100 mg by  mouth 3 (three) times daily.      Marland Kitchen tiotropium (SPIRIVA) 18 MCG inhalation capsule Place 18 mcg into inhaler and inhale daily.      . traZODone (DESYREL) 150 MG tablet Take 150 mg by mouth at bedtime.      . vitamin B-12 (CYANOCOBALAMIN) 1000 MCG tablet Take 5,000 mcg by mouth daily.       No current facility-administered medications for this visit.    Allergies:    Allergies  Allergen Reactions  . Tape Itching and Rash    Social History:  The patient  reports that she quit smoking about 2 months ago. She has never used smokeless tobacco. She reports that she does not drink alcohol or use illicit drugs.   ROS:  Please see the history of present illness.   Denies any strokes, bleeding, orthopnea, PND. Positive for shortness of breath with COPD. Chest pain secondary to cough/rib fracture.    PHYSICAL EXAM: VS:  Wt 151 lb (68.493 kg) Well nourished, well developed, in no acute distress looks older than stated age, looks tired HEENT: normalNasal cannula oxygen in place Neck: no JVD Cardiac:  normal S1, S2; RRR; no murmur Lungs:  Diffuse rhonchi mostly left with minor wheeze, minimally increased respiratory effort.  Abd: soft, nontender, no hepatomegaly Ext: no edema Skin: warm and dry Neuro: no focal abnormalities noted  EKG:  Sinus tachycardia rate 105 with no other specific abnormalities other than low voltage Echocardiogram 09/08/13-normal ejection fraction, 75%, normal right ventricle, no evidence of increased central venous pressure   Cardiac catheterization 2004-no CAD  ASSESSMENT AND PLAN:  1. Coronary artery calcification-according to her history, this was seen on a CT scan. I'm concerned that she could possibly have triple-vessel coronary artery disease given her extensive COPD/smoking history/recurrent hospitalizations. High likelihood of CAD. I did explain to her however that I believe her severe COPD as driving the majority of her symptoms. I also strongly encouraged her to  followup with her pulmonary doctor because of her recent hospitalizations. 2. Chest pain/rib fracture/severe COPD/hypoxia-I did not want to give her nitroglycerin at this point. Her chest pain could most likely be secondary to musculoskeletal/COPD. We will further evaluate with coronary angiogram. 3. Tobacco abuse-encourage tobacco cessation. She used to smoke 2 packs a day, now down to 2 cigarettes she states. 4. We will see her back in 2 weeks post catheterization.  Signed, Candee Furbish, MD [facility]  10/03/2013 12:06 PM    Cardiac catheterization orders have been  placed.

## 2013-10-03 NOTE — Addendum Note (Signed)
Addended by: Candee Furbish C on: 10/03/2013 01:18 PM   Modules accepted: Orders

## 2013-10-03 NOTE — Patient Instructions (Signed)
Your physician recommends that you continue on your current medications as directed. Please refer to the Current Medication list given to you today.  Your physician has requested that you have a Left Heart Cardiac Catheterization. Cardiac catheterization is used to diagnose and/or treat various heart conditions. Doctors may recommend this procedure for a number of different reasons. The most common reason is to evaluate chest pain. Chest pain can be a symptom of coronary artery disease (CAD), and cardiac catheterization can show whether plaque is narrowing or blocking your heart's arteries. This procedure is also used to evaluate the valves, as well as measure the blood flow and oxygen levels in different parts of your heart. For further information please visit [email or web address]. Please follow instruction sheet, as given. 10/18/2013 @ 7:30 a.m.  Your physician recommends that you return for lab work in: 1 week , BMET, [place] [place]  Your physician recommends that you schedule a follow-up appointment in: 2 weeks after Cath.

## 2013-10-05 ENCOUNTER — Encounter (HOSPITAL_COMMUNITY): Payer: Self-pay | Admitting: Pharmacy Technician

## 2013-10-10 ENCOUNTER — Other Ambulatory Visit: Payer: Medicare Other

## 2013-10-14 ENCOUNTER — Other Ambulatory Visit (INDEPENDENT_AMBULATORY_CARE_PROVIDER_SITE_OTHER): Payer: Medicare Other

## 2013-10-14 ENCOUNTER — Telehealth: Payer: Self-pay | Admitting: Cardiology

## 2013-10-14 DIAGNOSIS — I2510 Atherosclerotic heart disease of native coronary artery without angina pectoris: Secondary | ICD-10-CM

## 2013-10-14 DIAGNOSIS — Z01812 Encounter for preprocedural laboratory examination: Secondary | ICD-10-CM

## 2013-10-14 LAB — BASIC METABOLIC PANEL
BUN: 29 mg/dL — ABNORMAL HIGH (ref 6–23)
CO2: 28 meq/L (ref 19–32)
Calcium: 9.3 mg/dL (ref 8.4–10.5)
Chloride: 102 mEq/L (ref 96–112)
Creatinine, Ser: 1.5 mg/dL — ABNORMAL HIGH (ref 0.4–1.2)
GFR: 38.61 mL/min — ABNORMAL LOW (ref >60.00)
Glucose, Bld: 128 mg/dL — ABNORMAL HIGH (ref 70–99)
POTASSIUM: 4.5 meq/L (ref 3.5–5.1)
SODIUM: 140 meq/L (ref 135–145)

## 2013-10-14 NOTE — Telephone Encounter (Signed)
New message    Patient calling asking questions should she have lab work done today and keep her schedule procedure on 2/24 . Had recent labs done on yesterday at her pcp.

## 2013-10-14 NOTE — Telephone Encounter (Signed)
Patient wants a call back ASAP  

## 2013-10-17 ENCOUNTER — Other Ambulatory Visit: Payer: Self-pay | Admitting: Cardiology

## 2013-10-17 DIAGNOSIS — I5032 Chronic diastolic (congestive) heart failure: Secondary | ICD-10-CM

## 2013-10-17 DIAGNOSIS — Z01812 Encounter for preprocedural laboratory examination: Secondary | ICD-10-CM

## 2013-10-17 NOTE — Telephone Encounter (Signed)
Spoke with patient patient advised that due to her labs drawn by PCP , he advised that Dr. Marlou Porch may not want to do the procedure. Patient came in on 10/14/2013 for labs. Waiting on results will call patient regarding results.

## 2013-10-18 ENCOUNTER — Ambulatory Visit (HOSPITAL_COMMUNITY)
Admission: RE | Admit: 2013-10-18 | Discharge: 2013-10-18 | Disposition: A | Discharge status: Home / Self Care | Payer: Medicare Other | Source: Ambulatory Visit | Attending: Cardiology | Admitting: Cardiology

## 2013-10-18 ENCOUNTER — Encounter (HOSPITAL_COMMUNITY)
Admission: RE | Disposition: A | Discharge status: Home / Self Care | Payer: Self-pay | Source: Ambulatory Visit | Attending: Cardiology

## 2013-10-18 DIAGNOSIS — Z9981 Dependence on supplemental oxygen: Secondary | ICD-10-CM | POA: Insufficient documentation

## 2013-10-18 DIAGNOSIS — F329 Major depressive disorder, single episode, unspecified: Secondary | ICD-10-CM | POA: Insufficient documentation

## 2013-10-18 DIAGNOSIS — J441 Chronic obstructive pulmonary disease with (acute) exacerbation: Secondary | ICD-10-CM

## 2013-10-18 DIAGNOSIS — J449 Chronic obstructive pulmonary disease, unspecified: Secondary | ICD-10-CM | POA: Diagnosis present

## 2013-10-18 DIAGNOSIS — E039 Hypothyroidism, unspecified: Secondary | ICD-10-CM | POA: Insufficient documentation

## 2013-10-18 DIAGNOSIS — R0609 Other forms of dyspnea: Secondary | ICD-10-CM

## 2013-10-18 DIAGNOSIS — J438 Other emphysema: Secondary | ICD-10-CM | POA: Insufficient documentation

## 2013-10-18 DIAGNOSIS — F411 Generalized anxiety disorder: Secondary | ICD-10-CM | POA: Insufficient documentation

## 2013-10-18 DIAGNOSIS — I5032 Chronic diastolic (congestive) heart failure: Secondary | ICD-10-CM | POA: Insufficient documentation

## 2013-10-18 DIAGNOSIS — R0989 Other specified symptoms and signs involving the circulatory and respiratory systems: Secondary | ICD-10-CM

## 2013-10-18 DIAGNOSIS — E7800 Pure hypercholesterolemia, unspecified: Secondary | ICD-10-CM | POA: Insufficient documentation

## 2013-10-18 DIAGNOSIS — F3289 Other specified depressive episodes: Secondary | ICD-10-CM | POA: Insufficient documentation

## 2013-10-18 DIAGNOSIS — I509 Heart failure, unspecified: Secondary | ICD-10-CM | POA: Insufficient documentation

## 2013-10-18 DIAGNOSIS — N189 Chronic kidney disease, unspecified: Secondary | ICD-10-CM | POA: Insufficient documentation

## 2013-10-18 DIAGNOSIS — IMO0001 Reserved for inherently not codable concepts without codable children: Secondary | ICD-10-CM | POA: Insufficient documentation

## 2013-10-18 DIAGNOSIS — F17200 Nicotine dependence, unspecified, uncomplicated: Secondary | ICD-10-CM | POA: Insufficient documentation

## 2013-10-18 DIAGNOSIS — I2510 Atherosclerotic heart disease of native coronary artery without angina pectoris: Secondary | ICD-10-CM

## 2013-10-18 DIAGNOSIS — R0600 Dyspnea, unspecified: Secondary | ICD-10-CM

## 2013-10-18 DIAGNOSIS — J4489 Other specified chronic obstructive pulmonary disease: Secondary | ICD-10-CM | POA: Diagnosis present

## 2013-10-18 HISTORY — PX: LEFT HEART CATHETERIZATION WITH CORONARY ANGIOGRAM: SHX5451

## 2013-10-18 LAB — POCT I-STAT 3, ART BLOOD GAS (G3+)
ACID-BASE EXCESS: 3 mmol/L — AB (ref 0.0–2.0)
Bicarbonate: 29.4 mEq/L — ABNORMAL HIGH (ref 20.0–24.0)
O2 SAT: 93 %
PH ART: 7.348 — AB (ref 7.350–7.450)
TCO2: 31 mmol/L (ref 0–100)
pCO2 arterial: 53.4 mmHg — ABNORMAL HIGH (ref 35.0–45.0)
pO2, Arterial: 71 mmHg — ABNORMAL LOW (ref 80.0–100.0)

## 2013-10-18 LAB — POCT I-STAT 3, VENOUS BLOOD GAS (G3P V)
Acid-base deficit: 2 mmol/L (ref 0.0–2.0)
Bicarbonate: 25.9 mEq/L — ABNORMAL HIGH (ref 20.0–24.0)
O2 SAT: 67 %
TCO2: 28 mmol/L (ref 0–100)
pCO2, Ven: 56.7 mmHg — ABNORMAL HIGH (ref 45.0–50.0)
pH, Ven: 7.268 (ref 7.250–7.300)
pO2, Ven: 40 mmHg (ref 30.0–45.0)

## 2013-10-18 LAB — PROTIME-INR
INR: 0.9 (ref 0.00–1.49)
Prothrombin Time: 12 seconds (ref 11.6–15.2)

## 2013-10-18 LAB — CBC
HCT: 36.7 % (ref 36.0–46.0)
HEMOGLOBIN: 11.8 g/dL — AB (ref 12.0–15.0)
MCH: 32.8 pg (ref 26.0–34.0)
MCHC: 32.2 g/dL (ref 30.0–36.0)
MCV: 101.9 fL — ABNORMAL HIGH (ref 78.0–100.0)
Platelets: 255 10*3/uL (ref 150–400)
RBC: 3.6 MIL/uL — AB (ref 3.87–5.11)
RDW: 16.5 % — ABNORMAL HIGH (ref 11.5–15.5)
WBC: 10.5 10*3/uL (ref 4.0–10.5)

## 2013-10-18 SURGERY — LEFT HEART CATHETERIZATION WITH CORONARY ANGIOGRAM
Anesthesia: LOCAL

## 2013-10-18 MED ORDER — SODIUM CHLORIDE 0.9 % IV SOLN
INTRAVENOUS | Status: DC
  Administered 2013-10-18: 07:00:00 via INTRAVENOUS

## 2013-10-18 MED ORDER — DIAZEPAM 5 MG PO TABS: 2.5000 mg | ORAL_TABLET | ORAL | Status: DC

## 2013-10-18 MED ORDER — ASPIRIN 81 MG PO CHEW
81.0000 mg | CHEWABLE_TABLET | ORAL | Status: AC
  Administered 2013-10-18: 81 mg via ORAL
  Filled 2013-10-18: qty 1

## 2013-10-18 MED ORDER — SODIUM CHLORIDE 0.9 % IJ SOLN: 3.0000 mL | INTRAMUSCULAR | Status: DC | PRN

## 2013-10-18 MED ORDER — SODIUM CHLORIDE 0.9 % IV SOLN: 1.0000 mL/kg/h | INTRAVENOUS | Status: DC

## 2013-10-18 MED ORDER — LIDOCAINE HCL (PF) 1 % IJ SOLN
INTRAMUSCULAR | Status: AC
  Filled 2013-10-18: qty 30

## 2013-10-18 MED ORDER — HEPARIN (PORCINE) IN NACL 2-0.9 UNIT/ML-% IJ SOLN
INTRAMUSCULAR | Status: AC
  Filled 2013-10-18: qty 1500

## 2013-10-18 MED ORDER — SODIUM CHLORIDE 0.9 % IV SOLN: 250.0000 mL | INTRAVENOUS | Status: DC | PRN

## 2013-10-18 MED ORDER — SODIUM CHLORIDE 0.9 % IJ SOLN: 3.0000 mL | Freq: Two times a day (BID) | INTRAMUSCULAR | Status: DC

## 2013-10-18 NOTE — CV Procedure (Addendum)
    CARDIAC CATHETERIZATION  PROCEDURE:  Right and left heart catheterization with selective coronary angiography, left ventricular pressures only, cardiac outputs, selective oxygen saturations.  INDICATIONS:  53 year-old female with normal ejection fraction, severe COPD on home oxygen with persistent dyspnea and concern for severe coronary artery disease. Chronic kidney disease with creatinine ranging from 1.5-1.9. IV fluid hydration was given prior to cardiac catheterization.  The risks, benefits, and details of the procedure were explained to the patient, including stroke, heart attack, death, renal impairment, arterial damage, bleeding.  The patient verbalized understanding and wanted to proceed.  Informed written consent was obtained.  PROCEDURE TECHNIQUE:  After Xylocaine anesthesia and visualization of the femoral head via fluoroscopy, a 50F sheath was placed in the right femoral artery using the modified Seldinger technique.  A 7 French sheath was inserted into the right femoral vein using the modified Seldinger technique. Left coronary angiography was done using a Judkins L4 catheter.  Right coronary angiography was done using a 3 DRC catheter. Multiple views with hand injection of Omnipaque were obtained. Left ventricle was entered using a pigtail catheter in the RAO position. No left ventriculogram was performed. Right heart catheterization was performed with a balloon tipped Swan-Ganz catheter traversing the right-sided heart structures to the wedge position. Oxygen saturation was drawn in the pulmonary artery as well as femoral artery. Hemodynamics were obtained. Cardiac outputs were obtained utilizing the Fick method. Following the procedure, sheaths were removed and patient was hemodynamically stable.   CONTRAST:  Total of 32 ml.  FLOUROSCOPY TIME: 4.5 minutes.  COMPLICATIONS:  None.    HEMODYNAMICS:    Right atrium (RA): 18/17/16 mmHg Right ventricle (RV): 37/14/16 mmHg Pulmonary  artery (PA): 36/24/31 mmHg Pulmonary capillary wedge pressure (PCWP): 27/29/26 mmHg  Cardiac output: Fick 5.45 liters/min Cardiac index: Fick 3.19  PA saturation: 67% FA saturation: 93%   Aortic pressure: 103/73 mmHg LV pressure: 103/18/24 mmHg  LVEDP 24 mmHg.   There was no gradient between the left ventricle and aorta.    ANGIOGRAPHIC DATA:    Left main: Large caliber vessel giving rise to the LAD and circumflex with no angiographically significant disease.  Left anterior descending (LAD): Minor luminal irregularity in the mid LAD segment after a septal/diagonal branch of approximately 20-30%. LAD wraps around the apex. There are 2 diagonal branches both of which are widely patent.  Circumflex artery (CIRC): Large caliber vessel giving rise to 2 obtuse marginal branches which are tortuous, no angiographically significant disease present.  Right coronary artery (RCA): Dominant vessel giving rise to the posterior descending artery. No angiographically significant disease.  LEFT VENTRICULOGRAM:  Not performed. LV pressures obtained. Echocardiogram EF 65-70%.  IMPRESSIONS:  1. No angiographically significant coronary artery disease. 2. Normal left ventricular systolic function on echocardiogram of 70%.  LVEDP 24 mmHg, elevated consistent with diastolic dysfunction which is contributing to her dyspnea.  3. Mildly elevated right-sided heart pressures. Mild pulmonary hypertension with mean PA pressure of 31 mm mercury. Elevated central venous pressure/right atrial pressure with a mean of 16 mm mercury. Wedge pressure consistent with left ventricular end-diastolic pressure, elevated. Diastolic dysfunction. 4. Normal cardiac output.  RECOMMENDATION:  Both diastolic dysfunction as well as severe COPD are contributing to her overall symptoms. Reassuring that she does not have severe coronary artery disease. Continue to optimize both cardiac and pulmonary status. IV hydration. Minimal dye  use.

## 2013-10-18 NOTE — H&P (View-Only) (Signed)
[place]. [address], [place], [place]  [address] Phone: [phone] Fax:  [phone]  Date:  10/03/2013   ID:  [name], DOB [birth year], MRN [id number]  PCP:  [name], NP   History of Present Illness: [name] is a 53 y.o. female was in hospital twice in 1/15 . Severe COPD exacerbation/pneumonia. BiPap. ECHO in December. CXR showed PNA. CT scan showed CAD (cornary calcification). Saw Dr. In [place] who did not clear her previously for surgery said she was not getting enough oxygen to her heart. No prior MI. Has had prior endoscopy and hip surgery without difficultly after she was told that she was too high risk for surgery by previous cardiologist. Dr. Chilton Si she says.   She is currently recovering from her hospitalization. She is being seen today at the request of her primary practitioner to discover if further action needs to be taken on her coronary artery calcification in her overall general cardiac health.  She currently has pain with coughing, chronic shortness of breath. Worse with exertional change.  Her medical records were reviewed. 1/26-29 with COPD/HF. She is seeing a pulmonologist in [place].  She is very concerned that she may have severe coronary artery disease and this is a distinct possibility for her given her underlying COPD, ongoing smoking. She does have some exertional discomfort/change which is likely musculoskeletal/COPD related however. I do not believe that it is prudent to proceed with stress test with her underlying lung condition and I would like to proceed with diagnostic cardiac catheterization to ensure that she does not have triple-vessel coronary artery disease. If she did, she would be at increased risk for surgery given her underlying lung condition.      Wt Readings from Last 3 Encounters:  10/03/13 151 lb (68.493 kg)  09/29/13 145 lb 8 oz (65.998 kg)  09/22/13 150 lb (68.04 kg)     Past Medical History    Diagnosis Date  . Right-sided heart failure   . COPD (chronic obstructive pulmonary disease)   . Emphysema   . Seizures   . Thyroid disease   . Depression   . Emphysema   . Arthritis     back and legs  . Blood transfusion without reported diagnosis   . High cholesterol   . Anxiety   . Nerves   . Panic attack   . Thyroid disorder   . Hypothyroidism   . Pneumonia   . CHF (congestive heart failure)   . On home O2     4L N/C chronic  . Fibromyalgia     Past Surgical History  Procedure Laterality Date  . Abdominal hysterectomy    . Cesarean section    . Cholecystectomy    . Laser removal condyloma      Current Outpatient Prescriptions  Medication Sig Dispense Refill  . ALPRAZolam (XANAX) 0.5 MG tablet Take 0.5 mg by mouth 2 (two) times daily as needed for anxiety.      . Calcium Carb-Cholecalciferol (CALCIUM 600 + D PO) Take 1 tablet by mouth 2 (two) times daily.      Marland Kitchen diltiazem (TIAZAC) 300 MG 24 hr capsule Take 300 mg by mouth daily.      . DULoxetine (CYMBALTA) 60 MG capsule Take 60 mg by mouth 2 (two) times daily.      . ferrous sulfate 325 (65 FE) MG EC tablet Take 325 mg by mouth daily.      Marland Kitchen  Fluticasone-Salmeterol (ADVAIR) 250-50 MCG/DOSE AEPB Inhale 1 puff into the lungs every 12 (twelve) hours.      . furosemide (LASIX) 40 MG tablet Take 40 mg by mouth daily.      Marland Kitchen levothyroxine (SYNTHROID, LEVOTHROID) 150 MCG tablet Take 150 mcg by mouth daily.      . Linaclotide (LINZESS) 145 MCG CAPS capsule Take 145 mcg by mouth daily.      Marland Kitchen loratadine (CLARITIN) 10 MG tablet Take 10 mg by mouth at bedtime.      . Oxycodone HCl 10 MG TABS Take 0.5 tablets (5 mg total) by mouth every 6 (six) hours as needed (for pain).    0  . pantoprazole (PROTONIX) 40 MG tablet Take 1 tablet (40 mg total) by mouth 2 (two) times daily.  60 tablet  2  . potassium chloride SA (K-DUR,KLOR-CON) 20 MEQ tablet Take 20 mEq by mouth daily.      . pregabalin (LYRICA) 100 MG capsule Take 100 mg by  mouth 3 (three) times daily.      Marland Kitchen tiotropium (SPIRIVA) 18 MCG inhalation capsule Place 18 mcg into inhaler and inhale daily.      . traZODone (DESYREL) 150 MG tablet Take 150 mg by mouth at bedtime.      . vitamin B-12 (CYANOCOBALAMIN) 1000 MCG tablet Take 5,000 mcg by mouth daily.       No current facility-administered medications for this visit.    Allergies:    Allergies  Allergen Reactions  . Tape Itching and Rash    Social History:  The patient  reports that she quit smoking about 2 months ago. She has never used smokeless tobacco. She reports that she does not drink alcohol or use illicit drugs.   ROS:  Please see the history of present illness.   Denies any strokes, bleeding, orthopnea, PND. Positive for shortness of breath with COPD. Chest pain secondary to cough/rib fracture.    PHYSICAL EXAM: VS:  Wt 151 lb (68.493 kg) Well nourished, well developed, in no acute distress looks older than stated age, looks tired HEENT: normalNasal cannula oxygen in place Neck: no JVD Cardiac:  normal S1, S2; RRR; no murmur Lungs:  Diffuse rhonchi mostly left with minor wheeze, minimally increased respiratory effort.  Abd: soft, nontender, no hepatomegaly Ext: no edema Skin: warm and dry Neuro: no focal abnormalities noted  EKG:  Sinus tachycardia rate 105 with no other specific abnormalities other than low voltage Echocardiogram 09/08/13-normal ejection fraction, 75%, normal right ventricle, no evidence of increased central venous pressure   Cardiac catheterization 2004-no CAD  ASSESSMENT AND PLAN:  1. Coronary artery calcification-according to her history, this was seen on a CT scan. I'm concerned that she could possibly have triple-vessel coronary artery disease given her extensive COPD/smoking history/recurrent hospitalizations. High likelihood of CAD. I did explain to her however that I believe her severe COPD as driving the majority of her symptoms. I also strongly encouraged her to  followup with her pulmonary doctor because of her recent hospitalizations. 2. Chest pain/rib fracture/severe COPD/hypoxia-I did not want to give her nitroglycerin at this point. Her chest pain could most likely be secondary to musculoskeletal/COPD. We will further evaluate with coronary angiogram. 3. Tobacco abuse-encourage tobacco cessation. She used to smoke 2 packs a day, now down to 2 cigarettes she states. 4. We will see her back in 2 weeks post catheterization.  Signed, Candee Furbish, MD [facility]  10/03/2013 12:06 PM    Cardiac catheterization orders have been  placed.

## 2013-10-18 NOTE — Interval H&P Note (Signed)
History and Physical Interval Note: Creatinine has ranged from 1.5-1.9. We discussed this at length. Risks of renal impairment. We are going to bolus her with IV fluids. Because of underlying lung disease, we will be performing a right and left heart catheterization. I discussed with her prior to procedure. Risks of stroke, death, renal impairment, myocardial infarction, arrhythmia have been explained. Questions answered. She is willing to proceed. Because of the concomitant right heart cath, we will proceed with right groin approach.  10/18/2013 7:45 AM  [name]  has presented today for surgery, with the diagnosis of cad  The various methods of treatment have been discussed with the patient and family. After consideration of risks, benefits and other options for treatment, the patient has consented to  Procedure(s): LEFT HEART CATHETERIZATION WITH CORONARY ANGIOGRAM (N/A) as a surgical intervention .  The patient's history has been reviewed, patient examined, no change in status, stable for surgery.  I have reviewed the patient's chart and labs.  Questions were answered to the patient's satisfaction.     Brynn Mulgrew

## 2013-10-18 NOTE — Discharge Instructions (Signed)
Angiography, Care After °Refer to this sheet in the next few weeks. These instructions provide you with information on caring for yourself after your procedure. Your health care provider may also give you more specific instructions. Your treatment has been planned according to current medical practices, but problems sometimes occur. Call your health care provider if you have any problems or questions after your procedure.  °WHAT TO EXPECT AFTER THE PROCEDURE °After your procedure, it is typical to have the following sensations: °· Minor discomfort or tenderness and a small bump at the catheter insertion site. The bump should usually decrease in size and tenderness within 1 to 2 weeks. °· Any bruising will usually fade within 2 to 4 weeks. °HOME CARE INSTRUCTIONS  °· You may need to keep taking blood thinners if they were prescribed for you. Only take over-the-counter or prescription medicines for pain, fever, or discomfort as directed by your health care provider. °· Do not apply powder or lotion to the site. °· Do not sit in a bathtub, swimming pool, or whirlpool for 5 to 7 days. °· You may shower 24 hours after the procedure. Remove the bandage (dressing) and gently wash the site with plain soap and water. Gently pat the site dry. °· Inspect the site at least twice daily. °· Limit your activity for the first 24 hours. Do not bend, squat, or lift anything over 10 lb or as directed by your health care provider. °· Do not drive home if you are discharged the day of the procedure. Have someone else drive you. Follow instructions about when you can drive or return to work. °SEEK MEDICAL CARE IF: °· You get lightheaded when standing up. °· You have drainage (other than a small amount of blood on the dressing). °· You have chills. °· You have a fever. °· You have redness, warmth, swelling, or pain at the insertion site. °SEEK IMMEDIATE MEDICAL CARE IF:  °· You develop chest pain or shortness of breath, feel faint, or  pass out. °· You have bleeding, swelling larger than a walnut, or drainage from the catheter insertion site. °· You develop pain, discoloration, coldness, or severe bruising in the leg or arm that held the catheter. °· You have heavy bleeding from the site. If this happens, hold pressure on the site. °MAKE SURE YOU: °· Understand these instructions. °· Will watch your condition. °· Will get help right away if you are not doing well or get worse. °Document Released: 02/27/2005 Document Revised: 04/13/2013 Document Reviewed: 01/03/2013 °[employer]® Patient Information ©2014 [employer]. ° °

## 2013-10-19 ENCOUNTER — Ambulatory Visit: Payer: Medicare Other | Admitting: Obstetrics & Gynecology

## 2013-10-25 NOTE — Telephone Encounter (Signed)
Patient is scheduled to see Dr. Marlou Porch on 11/01/2013 to discuss

## 2013-10-25 NOTE — Telephone Encounter (Signed)
Procedure done.

## 2013-11-01 ENCOUNTER — Ambulatory Visit (INDEPENDENT_AMBULATORY_CARE_PROVIDER_SITE_OTHER): Payer: Medicare Other | Admitting: Cardiology

## 2013-11-01 ENCOUNTER — Encounter: Payer: Self-pay | Admitting: Cardiology

## 2013-11-01 VITALS — BP 126/74 | HR 100 | Ht 62.0 in | Wt 159.0 lb

## 2013-11-01 DIAGNOSIS — J449 Chronic obstructive pulmonary disease, unspecified: Secondary | ICD-10-CM

## 2013-11-01 DIAGNOSIS — I2510 Atherosclerotic heart disease of native coronary artery without angina pectoris: Secondary | ICD-10-CM

## 2013-11-01 DIAGNOSIS — R0902 Hypoxemia: Secondary | ICD-10-CM

## 2013-11-01 DIAGNOSIS — I2584 Coronary atherosclerosis due to calcified coronary lesion: Secondary | ICD-10-CM

## 2013-11-01 DIAGNOSIS — J4489 Other specified chronic obstructive pulmonary disease: Secondary | ICD-10-CM

## 2013-11-01 DIAGNOSIS — I5189 Other ill-defined heart diseases: Secondary | ICD-10-CM

## 2013-11-01 DIAGNOSIS — I519 Heart disease, unspecified: Secondary | ICD-10-CM

## 2013-11-01 NOTE — Progress Notes (Signed)
[facility]. [address], [place]  [address] Phone: [phone] Fax:  [phone]  Date:  11/01/2013   ID:  [name], DOB [birth year], MRN [id number]  PCP:  [name], NP   History of Present Illness: [name] is a 53 y.o. female was in hospital twice in 1/15 . Severe COPD exacerbation/pneumonia. BiPap. ECHO in December. CXR showed PNA. CT scan showed CAD (cornary calcification). She underwent cardiac catheterization on 10/18/13 which demonstrated no angiographically significant CAD, hyperdynamic left ventricular ejection fraction of 70%, elevated left ventricular end-diastolic pressure of 24 mm mercury consistent with diastolic dysfunction and only mildly elevated right-sided heart pressures with a mean PA pressure of 31 mm of mercury.  Saw Dr. In Tia Alert who did not clear her previously for surgery said she was not getting enough oxygen to her heart. No prior MI. Has had prior endoscopy and hip surgery without difficultly after she was told that she was too high risk for surgery by previous cardiologist. Dr. Chilton Si she says.      Wt Readings from Last 3 Encounters:  11/01/13 159 lb (72.122 kg)  10/18/13 154 lb (69.854 kg)  10/18/13 154 lb (69.854 kg)     Past Medical History  Diagnosis Date  . Right-sided heart failure   . COPD (chronic obstructive pulmonary disease)   . Emphysema   . Seizures   . Thyroid disease   . Depression   . Emphysema   . Arthritis     back and legs  . Blood transfusion without reported diagnosis   . High cholesterol   . Anxiety   . Nerves   . Panic attack   . Thyroid disorder   . Hypothyroidism   . Pneumonia   . CHF (congestive heart failure)   . On home O2     4L N/C chronic  . Fibromyalgia     Past Surgical History  Procedure Laterality Date  . Abdominal hysterectomy    . Cesarean section    . Cholecystectomy    . Laser removal condyloma      Current Outpatient Prescriptions  Medication Sig  Dispense Refill  . albuterol (PROVENTIL) (2.5 MG/3ML) 0.083% nebulizer solution Take 2.5 mg by nebulization 4 (four) times daily.      Marland Kitchen ALPRAZolam (XANAX) 0.5 MG tablet Take 0.5 mg by mouth 2 (two) times daily as needed for anxiety.      . Calcium Carb-Cholecalciferol (CALCIUM 600 + D PO) Take 1 tablet by mouth 2 (two) times daily.      . Cyanocobalamin (VITAMIN B-12) 5000 MCG SUBL Place 5,000 mcg under the tongue daily.      Marland Kitchen diltiazem (TIAZAC) 300 MG 24 hr capsule Take 300 mg by mouth daily.      . DULoxetine (CYMBALTA) 60 MG capsule Take 60 mg by mouth 2 (two) times daily.      . ferrous sulfate 325 (65 FE) MG EC tablet Take 325 mg by mouth daily.      . Fluticasone-Salmeterol (ADVAIR) 250-50 MCG/DOSE AEPB Inhale 1 puff into the lungs every 12 (twelve) hours.      . furosemide (LASIX) 40 MG tablet Take 40 mg by mouth daily.      Marland Kitchen levofloxacin (LEVAQUIN) 500 MG tablet Take 500 mg by mouth. For 14 days (lung Doctor)      . levothyroxine (SYNTHROID, LEVOTHROID) 150 MCG tablet Take 150 mcg by mouth daily.      . Linaclotide (  LINZESS) 145 MCG CAPS capsule Take 145 mcg by mouth daily.      Marland Kitchen loratadine (CLARITIN) 10 MG tablet Take 10 mg by mouth at bedtime.      . Oxycodone HCl 10 MG TABS Take 0.5 tablets (5 mg total) by mouth every 6 (six) hours as needed (for pain).    0  . pantoprazole (PROTONIX) 40 MG tablet Take 1 tablet (40 mg total) by mouth 2 (two) times daily.  60 tablet  2  . potassium chloride SA (K-DUR,KLOR-CON) 20 MEQ tablet Take 20 mEq by mouth daily.      . pregabalin (LYRICA) 100 MG capsule Take 100 mg by mouth 3 (three) times daily.      Marland Kitchen tiotropium (SPIRIVA) 18 MCG inhalation capsule Place 18 mcg into inhaler and inhale daily.      . traZODone (DESYREL) 150 MG tablet Take 150 mg by mouth at bedtime.      . vitamin B-12 (CYANOCOBALAMIN) 1000 MCG tablet Take 5,000 mcg by mouth daily.       No current facility-administered medications for this visit.    Allergies:      Allergies  Allergen Reactions  . Tape Itching and Rash    Social History:  The patient  reports that she quit smoking about 2 months ago. She has never used smokeless tobacco. She reports that she does not drink alcohol or use illicit drugs.   ROS:  Please see the history of present illness.   Denies any strokes, bleeding, orthopnea, PND. Positive for shortness of breath with COPD. Chest pain secondary to cough/rib fracture.    PHYSICAL EXAM: VS:  BP 126/74  Pulse 100  Ht 5\' 2"  (1.575 m)  Wt 159 lb (72.122 kg)  BMI 29.07 kg/m2 Well nourished, well developed, in no acute distress looks older than stated age, looks tired HEENT: normalNasal cannula oxygen in place Neck: no JVD Cardiac:  normal S1, S2; RRR; no murmur Lungs:  Diffuse rhonchi mostly left with significant wheeze, minimally increased respiratory effort.  Abd: soft, nontender, no hepatomegaly Ext: no edema Skin: warm and dry Neuro: no focal abnormalities noted  EKG:  Sinus tachycardia rate 105 with no other specific abnormalities other than low voltage Echocardiogram 09/08/13-normal ejection fraction, 75%, normal right ventricle, no evidence of increased central venous pressure   Cardiac catheterization 2004-no CAD Cardiac cathererization 2015 - no CAD  ASSESSMENT AND PLAN:  1. Coronary artery calcification-Reassuring heart catheterization with no angiographically significant disease despite coronary calcification. Continue with aggressive secondary prevention 2. Chest pain/rib fracture/severe COPD/hypoxia- Her chest pain could most likely be secondary to musculoskeletal/COPD. We will further evaluate with coronary angiogram. 3. Tobacco abuse-encourage tobacco cessation. She used to smoke 2 packs a day, now down to 2 cigarettes she states. 4. Elevated left ventricular end-diastolic pressure-diastolic dysfunction present. 24 mm mercury. Continue with furosemide. Diltiazem. Her tachycardia is compensatory for her ongoing  hypoxia. At this point, it is evident that her symptoms are being driven mainly by pulmonary status. We will see her back in 4 months Signed, Candee Furbish, MD [facility]  11/01/2013 12:25 PM

## 2013-11-01 NOTE — Patient Instructions (Signed)
Your physician recommends that you continue on your current medications as directed. Please refer to the Current Medication list given to you today.  Your physician wants you to follow-up in:  4 months with Dr. Skains. You will receive a reminder letter in the mail two months in advance. If you don't receive a letter, please call our office to schedule the follow-up appointment.  

## 2013-11-11 ENCOUNTER — Inpatient Hospital Stay (HOSPITAL_COMMUNITY)
Admission: EM | Admit: 2013-11-11 | Discharge: 2013-11-16 | DRG: 029 | Disposition: A | Discharge status: Rehabilitation Facility | Payer: Medicare Other | Attending: Internal Medicine | Admitting: Internal Medicine

## 2013-11-11 ENCOUNTER — Encounter (HOSPITAL_COMMUNITY): Payer: Self-pay | Admitting: Emergency Medicine

## 2013-11-11 ENCOUNTER — Emergency Department (HOSPITAL_COMMUNITY): Payer: Medicare Other

## 2013-11-11 DIAGNOSIS — R569 Unspecified convulsions: Secondary | ICD-10-CM | POA: Diagnosis present

## 2013-11-11 DIAGNOSIS — N289 Disorder of kidney and ureter, unspecified: Secondary | ICD-10-CM

## 2013-11-11 DIAGNOSIS — Z6828 Body mass index (BMI) 28.0-28.9, adult: Secondary | ICD-10-CM

## 2013-11-11 DIAGNOSIS — Z9981 Dependence on supplemental oxygen: Secondary | ICD-10-CM

## 2013-11-11 DIAGNOSIS — J441 Chronic obstructive pulmonary disease with (acute) exacerbation: Secondary | ICD-10-CM

## 2013-11-11 DIAGNOSIS — N189 Chronic kidney disease, unspecified: Secondary | ICD-10-CM | POA: Diagnosis present

## 2013-11-11 DIAGNOSIS — K5900 Constipation, unspecified: Secondary | ICD-10-CM | POA: Diagnosis not present

## 2013-11-11 DIAGNOSIS — F411 Generalized anxiety disorder: Secondary | ICD-10-CM | POA: Diagnosis present

## 2013-11-11 DIAGNOSIS — B009 Herpesviral infection, unspecified: Secondary | ICD-10-CM | POA: Diagnosis present

## 2013-11-11 DIAGNOSIS — R5381 Other malaise: Secondary | ICD-10-CM

## 2013-11-11 DIAGNOSIS — J9620 Acute and chronic respiratory failure, unspecified whether with hypoxia or hypercapnia: Secondary | ICD-10-CM

## 2013-11-11 DIAGNOSIS — K219 Gastro-esophageal reflux disease without esophagitis: Secondary | ICD-10-CM | POA: Diagnosis present

## 2013-11-11 DIAGNOSIS — F17200 Nicotine dependence, unspecified, uncomplicated: Secondary | ICD-10-CM | POA: Diagnosis present

## 2013-11-11 DIAGNOSIS — B370 Candidal stomatitis: Secondary | ICD-10-CM | POA: Diagnosis present

## 2013-11-11 DIAGNOSIS — IMO0001 Reserved for inherently not codable concepts without codable children: Secondary | ICD-10-CM | POA: Diagnosis present

## 2013-11-11 DIAGNOSIS — E669 Obesity, unspecified: Secondary | ICD-10-CM | POA: Diagnosis present

## 2013-11-11 DIAGNOSIS — E039 Hypothyroidism, unspecified: Secondary | ICD-10-CM | POA: Diagnosis present

## 2013-11-11 DIAGNOSIS — R0600 Dyspnea, unspecified: Secondary | ICD-10-CM

## 2013-11-11 DIAGNOSIS — G609 Hereditary and idiopathic neuropathy, unspecified: Secondary | ICD-10-CM | POA: Diagnosis present

## 2013-11-11 DIAGNOSIS — M545 Low back pain, unspecified: Secondary | ICD-10-CM

## 2013-11-11 DIAGNOSIS — E7800 Pure hypercholesterolemia, unspecified: Secondary | ICD-10-CM | POA: Diagnosis present

## 2013-11-11 DIAGNOSIS — J4489 Other specified chronic obstructive pulmonary disease: Secondary | ICD-10-CM | POA: Diagnosis present

## 2013-11-11 DIAGNOSIS — J438 Other emphysema: Secondary | ICD-10-CM | POA: Diagnosis present

## 2013-11-11 DIAGNOSIS — G9589 Other specified diseases of spinal cord: Secondary | ICD-10-CM

## 2013-11-11 DIAGNOSIS — G8929 Other chronic pain: Secondary | ICD-10-CM | POA: Diagnosis present

## 2013-11-11 DIAGNOSIS — J449 Chronic obstructive pulmonary disease, unspecified: Secondary | ICD-10-CM | POA: Diagnosis present

## 2013-11-11 DIAGNOSIS — G8220 Paraplegia, unspecified: Secondary | ICD-10-CM | POA: Diagnosis present

## 2013-11-11 DIAGNOSIS — Z9109 Other allergy status, other than to drugs and biological substances: Secondary | ICD-10-CM

## 2013-11-11 DIAGNOSIS — R29898 Other symptoms and signs involving the musculoskeletal system: Secondary | ICD-10-CM | POA: Diagnosis present

## 2013-11-11 DIAGNOSIS — I509 Heart failure, unspecified: Secondary | ICD-10-CM | POA: Diagnosis present

## 2013-11-11 DIAGNOSIS — I5032 Chronic diastolic (congestive) heart failure: Secondary | ICD-10-CM | POA: Diagnosis present

## 2013-11-11 DIAGNOSIS — K592 Neurogenic bowel, not elsewhere classified: Secondary | ICD-10-CM | POA: Diagnosis present

## 2013-11-11 DIAGNOSIS — R339 Retention of urine, unspecified: Secondary | ICD-10-CM | POA: Diagnosis present

## 2013-11-11 LAB — CBC WITH DIFFERENTIAL/PLATELET
BASOS PCT: 0 % (ref 0–1)
Basophils Absolute: 0 10*3/uL (ref 0.0–0.1)
EOS ABS: 0.1 10*3/uL (ref 0.0–0.7)
EOS PCT: 1 % (ref 0–5)
HCT: 43 % (ref 36.0–46.0)
Hemoglobin: 14.6 g/dL (ref 12.0–15.0)
Lymphocytes Relative: 13 % (ref 12–46)
Lymphs Abs: 1.5 10*3/uL (ref 0.7–4.0)
MCH: 34.6 pg — AB (ref 26.0–34.0)
MCHC: 34 g/dL (ref 30.0–36.0)
MCV: 101.9 fL — ABNORMAL HIGH (ref 78.0–100.0)
Monocytes Absolute: 1.2 10*3/uL — ABNORMAL HIGH (ref 0.1–1.0)
Monocytes Relative: 10 % (ref 3–12)
Neutro Abs: 8.9 10*3/uL — ABNORMAL HIGH (ref 1.7–7.7)
Neutrophils Relative %: 76 % (ref 43–77)
PLATELETS: 207 10*3/uL (ref 150–400)
RBC: 4.22 MIL/uL (ref 3.87–5.11)
RDW: 17 % — ABNORMAL HIGH (ref 11.5–15.5)
WBC: 11.7 10*3/uL — ABNORMAL HIGH (ref 4.0–10.5)

## 2013-11-11 LAB — I-STAT CHEM 8, ED
BUN: 39 mg/dL — ABNORMAL HIGH (ref 6–23)
Calcium, Ion: 1.13 mmol/L (ref 1.12–1.23)
Chloride: 93 mEq/L — ABNORMAL LOW (ref 96–112)
Creatinine, Ser: 1.8 mg/dL — ABNORMAL HIGH (ref 0.50–1.10)
Glucose, Bld: 79 mg/dL (ref 70–99)
HCT: 43 % (ref 36.0–46.0)
HEMOGLOBIN: 14.6 g/dL (ref 12.0–15.0)
Potassium: 4 mEq/L (ref 3.7–5.3)
SODIUM: 139 meq/L (ref 137–147)
TCO2: 42 mmol/L (ref 0–100)

## 2013-11-11 LAB — URINALYSIS, ROUTINE W REFLEX MICROSCOPIC
Bilirubin Urine: NEGATIVE
Glucose, UA: NEGATIVE mg/dL
Hgb urine dipstick: NEGATIVE
Ketones, ur: NEGATIVE mg/dL
Leukocytes, UA: NEGATIVE
NITRITE: NEGATIVE
PROTEIN: NEGATIVE mg/dL
Specific Gravity, Urine: 1.014 (ref 1.005–1.030)
UROBILINOGEN UA: 0.2 mg/dL (ref 0.0–1.0)
pH: 6 (ref 5.0–8.0)

## 2013-11-11 MED ORDER — SODIUM CHLORIDE 0.9 % IJ SOLN: 3.0000 mL | INTRAMUSCULAR | Status: DC | PRN

## 2013-11-11 MED ORDER — HYDROMORPHONE HCL PF 1 MG/ML IJ SOLN
1.0000 mg | Freq: Once | INTRAMUSCULAR | Status: AC
  Administered 2013-11-11: 1 mg via INTRAVENOUS
  Filled 2013-11-11: qty 1

## 2013-11-11 MED ORDER — SODIUM CHLORIDE 0.9 % IJ SOLN
3.0000 mL | Freq: Two times a day (BID) | INTRAMUSCULAR | Status: DC
  Administered 2013-11-11: 3 mL via INTRAVENOUS

## 2013-11-11 MED ORDER — SODIUM CHLORIDE 0.9 % IV SOLN
250.0000 mL | INTRAVENOUS | Status: DC | PRN
  Administered 2013-11-11: 250 mL via INTRAVENOUS

## 2013-11-11 NOTE — ED Notes (Signed)
Patient still gone to MRI, updated son on plan of care for the patient at this point.  No results are available.

## 2013-11-11 NOTE — ED Notes (Signed)
Per EMS, patient is from home with history of chronic back pain.  1 week ago, she received injections for her back pain, and noticed that she has been having more problems walking since then.  Monday was the last time she could ambulate with assistance, and today she can't ambulate at all.  Hx of COPD, currently wearing 4L nasal cannula which is her normal at home, with sat's in 98%.  Pt also complains of cough, and was recently treated for PNA in left lobe.  Blood pressure in route 136/76, heart rate 96, CBG 143, RR 18.  Patient is alert and oriented.

## 2013-11-11 NOTE — ED Notes (Signed)
Gertie Fey, PA-C  At the bedside to update the patient on the plan of care.  Will preform in and out cath if patient is still unable to void.

## 2013-11-11 NOTE — ED Provider Notes (Signed)
CSN: [id number]     Arrival date & time 11/11/13  1913 History   First MD Initiated Contact with Patient 11/11/13 1915     Chief Complaint  Patient presents with  . Back Pain     (Consider location/radiation/quality/duration/timing/severity/associated sxs/prior Treatment) HPI Comments: 53 year-old female appearing older than stated age arrived to the ED by EMS with complaints of back pain, left leg weakness, and inability to walk x 4 days. She states the pain is in her lower back and hurts. Nothing has relieved her pain including heating pads and medication she is prescribed for chronic pain. She is unable to move her leg because of weakness. She has been constipated for the past 4 days but has been passing gas. She also has difficulty initiating urination and painful urination but has not seen blood in her stool or urine. She is being seen by a bladder specialist.   Patient states she was seen at an outside hospital for the back pain and had a MRI of her spine 3 days ago which revealed need for surgery. However, patient is not a good candidate given her PMH of COPD and baseline 4L oxygen. She has an appointment to see a neurologist in 3 days. Patient is not currently experiencing any worsening shortness of breath, cough, fever or chills.   Patient is a 53 y.o. female presenting with back pain. The history is provided by the patient. No language interpreter was used.  Back Pain Location:  Lumbar spine Pain severity:  Severe Pain is:  Same all the time Timing:  Constant Relieved by:  Nothing Ineffective treatments:  Heating pad Associated symptoms: abdominal pain, abdominal swelling, bladder incontinence, dysuria, leg pain and weakness   Associated symptoms: no chest pain, no fever, no headaches and no numbness   Abdominal pain:    Location:  Generalized   Quality:  Pressure   Timing:  Constant   Chronicity:  Chronic Dysuria:    Chronicity:  New Weakness:    Severity:  Severe    Chronicity:  New   Timing:  Constant     Past Medical History  Diagnosis Date  . Right-sided heart failure   . COPD (chronic obstructive pulmonary disease)   . Emphysema   . Seizures   . Thyroid disease   . Depression   . Emphysema   . Arthritis     back and legs  . Blood transfusion without reported diagnosis   . High cholesterol   . Anxiety   . Nerves   . Panic attack   . Thyroid disorder   . Hypothyroidism   . Pneumonia   . CHF (congestive heart failure)   . On home O2     4L N/C chronic  . Fibromyalgia    Past Surgical History  Procedure Laterality Date  . Abdominal hysterectomy    . Cesarean section    . Cholecystectomy    . Laser removal condyloma     Family History  Problem Relation Age of Onset  . Cancer Father    History  Substance Use Topics  . Smoking status: Former Smoker    Quit date: 08/02/2013  . Smokeless tobacco: Never Used  . Alcohol Use: No   OB History   Grav Para Term Preterm Abortions TAB SAB Ect Mult Living   1 1 1       1      Review of Systems  Constitutional: Negative for fever and chills.  HENT: Positive for congestion. Negative  for ear pain and hearing loss.   Eyes: Negative for pain.  Respiratory: Positive for cough and shortness of breath.   Cardiovascular: Negative for chest pain.  Gastrointestinal: Positive for abdominal pain and constipation. Negative for diarrhea and blood in stool.  Genitourinary: Positive for bladder incontinence, dysuria, decreased urine volume and difficulty urinating. Negative for hematuria.  Musculoskeletal: Positive for back pain.  Neurological: Positive for weakness. Negative for syncope, numbness and headaches.      Allergies  Tape  Home Medications   Current Outpatient Rx  Name  Route  Sig  Dispense  Refill  . acyclovir (ZOVIRAX) 800 MG tablet   Oral   Take 800 mg by mouth 2 (two) times daily.         Marland Kitchen albuterol (PROVENTIL) (2.5 MG/3ML) 0.083% nebulizer solution    Nebulization   Take 2.5 mg by nebulization 4 (four) times daily.         Marland Kitchen ALPRAZolam (XANAX) 0.5 MG tablet   Oral   Take 0.5 mg by mouth 3 (three) times daily.          . Calcium Carb-Cholecalciferol (CALCIUM 600 + D PO)   Oral   Take 1 tablet by mouth 2 (two) times daily.         . Cyanocobalamin (VITAMIN B-12) 5000 MCG SUBL   Sublingual   Place 5,000 mcg under the tongue daily.         Marland Kitchen diltiazem (TIAZAC) 300 MG 24 hr capsule   Oral   Take 300 mg by mouth daily.         . DULoxetine (CYMBALTA) 60 MG capsule   Oral   Take 60 mg by mouth 2 (two) times daily.         . ferrous sulfate 325 (65 FE) MG EC tablet   Oral   Take 325 mg by mouth daily.         . Fluticasone-Salmeterol (ADVAIR) 250-50 MCG/DOSE AEPB   Inhalation   Inhale 1 puff into the lungs every 12 (twelve) hours.         . furosemide (LASIX) 40 MG tablet   Oral   Take 40 mg by mouth daily.         Marland Kitchen levothyroxine (SYNTHROID, LEVOTHROID) 150 MCG tablet   Oral   Take 150 mcg by mouth daily.         . Linaclotide (LINZESS) 145 MCG CAPS capsule   Oral   Take 145 mcg by mouth daily.         Marland Kitchen loratadine (CLARITIN) 10 MG tablet   Oral   Take 10 mg by mouth at bedtime.         Marland Kitchen oxyCODONE (ROXICODONE) 15 MG immediate release tablet   Oral   Take 15 mg by mouth every 6 (six) hours as needed for pain.         . pantoprazole (PROTONIX) 40 MG tablet   Oral   Take 1 tablet (40 mg total) by mouth 2 (two) times daily.   60 tablet   2   . potassium chloride SA (K-DUR,KLOR-CON) 20 MEQ tablet   Oral   Take 20 mEq by mouth daily.         . pregabalin (LYRICA) 100 MG capsule   Oral   Take 100 mg by mouth 3 (three) times daily.         Marland Kitchen tiotropium (SPIRIVA) 18 MCG inhalation capsule   Inhalation   Place 18 mcg into  inhaler and inhale daily.         . traZODone (DESYREL) 150 MG tablet   Oral   Take 150 mg by mouth at bedtime.          BP 131/81  Pulse 102   Temp(Src) 98.4 F (36.9 C) (Oral)  Resp 16  SpO2 96% Physical Exam  Constitutional:  Non-toxic appearance. She appears ill.  Cardiovascular: Tachycardia present.   Difficult to hear presence of any murmurs, gallops or rubs due to rhonchi and wheezes.  Pulmonary/Chest: She has decreased breath sounds. She has wheezes. She has rhonchi.  Rhonchi auscultated anteriorly and posteriorly.   Abdominal: She exhibits distension. There is generalized tenderness.  Mild tenderness at left costovertebral angle as well as left-sided back pain.   Musculoskeletal: She exhibits no tenderness.       Left hip: She exhibits decreased range of motion and decreased strength. She exhibits no tenderness.       Left knee: She exhibits decreased range of motion. No tenderness found.       Left ankle: She exhibits decreased range of motion. No tenderness.  Neurological: She is alert. No sensory deficit.  Reflex Scores:      Patellar reflexes are 1+ on the right side and 1+ on the left side. Decreased rectal tone. Mild foot drop to L foot but normal Babinski.  Intact sensation to BLE with light touch.    Skin: Skin is warm and dry.    ED Course  Procedures (including critical care time)  9:25 PM Pt report having worsening low back pain and now having L leg weakness, urinary retention, and constipation.  She did have an MRI of low back performed at [facility] on 11/09/13 which demonstrate no significant degenerative changes in the lumbar spine aside from L5 and sacral epidural lipomatosis.  Stable small right paracentral T12-L1 disc protrusion which appears to mildly affect the right T12 neuroforamen.    However pt unable to specify if she is having new sxs since the MRI.  Given that pt has reported weakness of L leg, and report urinary retention.  Will bladder scan, check post void residual.  I did discussed with Dr. Dina Rich, and will re-image pt for further evaluation and to r/o caudal equina.    11:04  PM Pt report unable to urinate since this morning.  In and out cath produce >800cc.  UA without UTI.  Evidence of renal insufficiency with BUN 39, Cr 1.8.  Lumbar MRI ordered, pain medication given.    12:02 AM Lumbar MRI demonstrate stable appearance of the lumbar spine with no significant degenerative changes identfied.  Epidural at the level of L5 and the sacrum.  Stable small right paracentral disc protrusion at T12-L1 with resultant mild right neural foraminal stenosis. Left sided facet arthrosis at T10-11 with resultant mild left foraminal narrowing.    No evidence of caudal equina but given that pt has urinary retention, unable to ambulate, and worsening lower back pain, i discussed with Dr. Dina Rich who recommend consulting neurosurgery for further management.    12:18 AM i have consulted neurosurgeon Dr. Ronnald Ramp who reviewed MRI and felt that pt's sxs does not correspond with MRI finding.  Will consult hospitalist for further evaluation.    1:20 AM I have consulted with [facility] Dr. Arnoldo Morale, who agrees to admit pt to med surg, team 10, under her care.     Labs Review Labs Reviewed  CBC WITH DIFFERENTIAL - Abnormal; Notable for the following:  WBC 11.7 (*)    MCV 101.9 (*)    MCH 34.6 (*)    RDW 17.0 (*)    Neutro Abs 8.9 (*)    Monocytes Absolute 1.2 (*)    All other components within normal limits  I-STAT CHEM 8, ED - Abnormal; Notable for the following:    Chloride 93 (*)    BUN 39 (*)    Creatinine, Ser 1.80 (*)    All other components within normal limits  URINALYSIS, ROUTINE W REFLEX MICROSCOPIC   Imaging Review Mr Lumbar Spine Wo Contrast  11/11/2013   CLINICAL DATA:  Low back pain, left leg weakness.  EXAM: MRI LUMBAR SPINE WITHOUT CONTRAST  TECHNIQUE: Multiplanar, multisequence MR imaging was performed. No intravenous contrast was administered.  COMPARISON:  Prior MRI from 11/09/2013.  FINDINGS: For the purposes of this dictation, the lowest well-formed  vertebral body is presumed to be the L5-S1 level, and there presumed to be 5 lumbar type vertebral bodies.  Vertebral bodies are normally aligned with preservation of the normal lumbar lordosis. Vertebral body height is stable. Signal intensity within the vertebral body bone marrow is within normal limits. No acute osseous abnormality. Signal intensity within the visualized spinal cord is normal. The conus medullaris terminates at the L1 level.  At T10-11, seen only on sagittal projection, there is left-sided facet arthrosis with minimal disc bulge. No canal stenosis. There is question of mild left foraminal stenosis.  At T11-12, there is mild diffuse disc bulge without associated stenosis.  At T12-L1, small right paracentral disc protrusion again noted, unchanged. There is an associated annular fissure. Mild right foraminal stenosis also stable.  At L1-2, no disc bulge or disc protrusion. The intervertebral disc is well hydrated. No facet arthrosis. No canal or neural foraminal stenosis.  At L2-3, there is mild diffuse disc bulge with disc desiccation. No significant canal or neural foraminal stenosis. No facet arthrosis.  At L3-4, no disc bulge or disc protrusion. No significant facet arthrosis. No canal or neural foraminal stenosis.  L4-5, no disc bulge or disc protrusion. The intervertebral discs well hydrated. No significant facet arthrosis. No canal or neural foraminal stenosis.  L5-S1, no disc bulge or disc protrusion identified. Mild epidural lipomatosis noted. No significant facet arthrosis. No canal or neural foraminal stenosis.  Bilateral renal cystic disease again noted, right greater than left. Visualized visceral structures are otherwise unremarkable.  IMPRESSION: 1. Stable appearance of the lumbar spine with no significant degenerative changes identified. Epidural at the level of L5 and the sacrum again noted. 2. Stable small right paracentral disc protrusion at T12-L1 with resultant mild right neural  foraminal stenosis. 3. Left-sided facet arthrosis at T10-11 with resultant mild left foraminal narrowing.   Electronically Signed   By: Jeannine Boga M.D.   On: 11/11/2013 23:39     EKG Interpretation None      MDM   Final diagnoses:  Urinary retention  Left leg weakness  Low back pain  Renal insufficiency    BP 134/83  Pulse 96  Temp(Src) 98.4 F (36.9 C) (Oral)  Resp 14  SpO2 97%  I have reviewed nursing notes and vital signs. I personally reviewed the imaging tests through PACS system  I reviewed available ER/hospitalization records thought the EMR     Domenic Moras, [place] 11/12/13 0121

## 2013-11-11 NOTE — ED Notes (Signed)
Gertie Fey, PA-C at the bedside.

## 2013-11-11 NOTE — ED Notes (Signed)
Patient has returned from MRI

## 2013-11-11 NOTE — ED Notes (Signed)
Went into pt room to walk pt in the hall pt says "I can't walk".

## 2013-11-11 NOTE — ED Notes (Signed)
Updated the family on the plan of care.

## 2013-11-11 NOTE — ED Notes (Signed)
Pt unable to void at this time. 

## 2013-11-11 NOTE — ED Notes (Signed)
Patient transported to MRI 

## 2013-11-11 NOTE — ED Notes (Signed)
Discussed the need to void with the patient for urine sample collection.  Patient reports she does not have to void at this time.

## 2013-11-12 ENCOUNTER — Inpatient Hospital Stay (HOSPITAL_COMMUNITY): Payer: Medicare Other

## 2013-11-12 ENCOUNTER — Observation Stay (HOSPITAL_COMMUNITY): Payer: Medicare Other

## 2013-11-12 ENCOUNTER — Encounter (HOSPITAL_COMMUNITY)
Admission: EM | Disposition: A | Discharge status: Rehabilitation Facility | Payer: Self-pay | Source: Home / Self Care | Attending: Pulmonary Disease

## 2013-11-12 ENCOUNTER — Encounter (HOSPITAL_COMMUNITY): Payer: Medicare Other | Admitting: Anesthesiology

## 2013-11-12 ENCOUNTER — Observation Stay (HOSPITAL_COMMUNITY): Payer: Medicare Other | Admitting: Anesthesiology

## 2013-11-12 ENCOUNTER — Encounter (HOSPITAL_COMMUNITY): Payer: Self-pay | Admitting: Neurological Surgery

## 2013-11-12 DIAGNOSIS — M545 Low back pain, unspecified: Secondary | ICD-10-CM

## 2013-11-12 DIAGNOSIS — J449 Chronic obstructive pulmonary disease, unspecified: Secondary | ICD-10-CM

## 2013-11-12 DIAGNOSIS — G8220 Paraplegia, unspecified: Secondary | ICD-10-CM | POA: Diagnosis present

## 2013-11-12 DIAGNOSIS — R29898 Other symptoms and signs involving the musculoskeletal system: Secondary | ICD-10-CM

## 2013-11-12 DIAGNOSIS — I5032 Chronic diastolic (congestive) heart failure: Secondary | ICD-10-CM

## 2013-11-12 DIAGNOSIS — R339 Retention of urine, unspecified: Secondary | ICD-10-CM | POA: Diagnosis present

## 2013-11-12 DIAGNOSIS — J9620 Acute and chronic respiratory failure, unspecified whether with hypoxia or hypercapnia: Secondary | ICD-10-CM

## 2013-11-12 DIAGNOSIS — F17200 Nicotine dependence, unspecified, uncomplicated: Secondary | ICD-10-CM

## 2013-11-12 HISTORY — PX: LAMINECTOMY: SHX219

## 2013-11-12 LAB — BLOOD GAS, ARTERIAL
ACID-BASE EXCESS: 8.3 mmol/L — AB (ref 0.0–2.0)
Bicarbonate: 33.1 mEq/L — ABNORMAL HIGH (ref 20.0–24.0)
Drawn by: 252031
FIO2: 0.5 %
LHR: 14 {breaths}/min
MECHVT: 400 mL
O2 SAT: 96.3 %
PEEP: 5 cmH2O
PO2 ART: 82.7 mmHg (ref 80.0–100.0)
Patient temperature: 98.6
TCO2: 34.7 mmol/L (ref 0–100)
pCO2 arterial: 52.8 mmHg — ABNORMAL HIGH (ref 35.0–45.0)
pH, Arterial: 7.414 (ref 7.350–7.450)

## 2013-11-12 LAB — BASIC METABOLIC PANEL
BUN: 33 mg/dL — ABNORMAL HIGH (ref 6–23)
CO2: 34 mEq/L — ABNORMAL HIGH (ref 19–32)
Calcium: 8.6 mg/dL (ref 8.4–10.5)
Chloride: 98 mEq/L (ref 96–112)
Creatinine, Ser: 1.25 mg/dL — ABNORMAL HIGH (ref 0.50–1.10)
GFR, EST AFRICAN AMERICAN: 57 mL/min — AB (ref >90)
GFR, EST NON AFRICAN AMERICAN: 49 mL/min — AB (ref >90)
GLUCOSE: 87 mg/dL (ref 70–99)
POTASSIUM: 3.8 meq/L (ref 3.7–5.3)
Sodium: 144 mEq/L (ref 137–147)

## 2013-11-12 LAB — URINALYSIS, ROUTINE W REFLEX MICROSCOPIC
BILIRUBIN URINE: NEGATIVE
Glucose, UA: NEGATIVE mg/dL
HGB URINE DIPSTICK: NEGATIVE
KETONES UR: NEGATIVE mg/dL
NITRITE: NEGATIVE
Protein, ur: NEGATIVE mg/dL
SPECIFIC GRAVITY, URINE: 1.018 (ref 1.005–1.030)
Urobilinogen, UA: 0.2 mg/dL (ref 0.0–1.0)
pH: 7 (ref 5.0–8.0)

## 2013-11-12 LAB — MRSA PCR SCREENING: MRSA BY PCR: NEGATIVE

## 2013-11-12 LAB — SEDIMENTATION RATE: Sed Rate: 17 mm/hr (ref 0–22)

## 2013-11-12 LAB — VITAMIN B12

## 2013-11-12 LAB — CBC
HCT: 42.9 % (ref 36.0–46.0)
HEMOGLOBIN: 13.9 g/dL (ref 12.0–15.0)
MCH: 33.4 pg (ref 26.0–34.0)
MCHC: 32.4 g/dL (ref 30.0–36.0)
MCV: 103.1 fL — ABNORMAL HIGH (ref 78.0–100.0)
PLATELETS: 207 10*3/uL (ref 150–400)
RBC: 4.16 MIL/uL (ref 3.87–5.11)
RDW: 17.2 % — ABNORMAL HIGH (ref 11.5–15.5)
WBC: 10.2 10*3/uL (ref 4.0–10.5)

## 2013-11-12 LAB — TSH: TSH: 4.075 u[IU]/mL (ref 0.350–4.500)

## 2013-11-12 LAB — GLUCOSE, CAPILLARY: GLUCOSE-CAPILLARY: 134 mg/dL — AB (ref 70–99)

## 2013-11-12 LAB — OSMOLALITY, URINE: Osmolality, Ur: 514 mOsm/kg (ref 390–1090)

## 2013-11-12 LAB — SODIUM, URINE, RANDOM: Sodium, Ur: 70 mEq/L

## 2013-11-12 LAB — URINE MICROSCOPIC-ADD ON

## 2013-11-12 LAB — RPR: RPR Ser Ql: NONREACTIVE

## 2013-11-12 LAB — CREATININE, URINE, RANDOM: Creatinine, Urine: 67.26 mg/dL

## 2013-11-12 SURGERY — THORACIC LAMINECTOMY FOR TUMOR
Anesthesia: General

## 2013-11-12 SURGERY — THORACIC LAMINECTOMY FOR TUMOR
Anesthesia: General | Site: Back

## 2013-11-12 MED ORDER — SUCCINYLCHOLINE CHLORIDE 20 MG/ML IJ SOLN
INTRAMUSCULAR | Status: DC | PRN
  Administered 2013-11-12: 100 mg via INTRAVENOUS

## 2013-11-12 MED ORDER — TAMSULOSIN HCL 0.4 MG PO CAPS
0.4000 mg | ORAL_CAPSULE | Freq: Every day | ORAL | Status: DC
  Administered 2013-11-13 – 2013-11-15 (×3): 0.4 mg via ORAL
  Filled 2013-11-12 (×5): qty 1

## 2013-11-12 MED ORDER — DEXAMETHASONE SODIUM PHOSPHATE 4 MG/ML IJ SOLN
INTRAMUSCULAR | Status: DC | PRN
  Administered 2013-11-12: 4 mg via INTRAVENOUS

## 2013-11-12 MED ORDER — ACYCLOVIR 800 MG PO TABS
800.0000 mg | ORAL_TABLET | Freq: Two times a day (BID) | ORAL | Status: DC
  Filled 2013-11-12 (×2): qty 1

## 2013-11-12 MED ORDER — VECURONIUM BROMIDE 10 MG IV SOLR
INTRAVENOUS | Status: DC | PRN
  Administered 2013-11-12: 3 mg via INTRAVENOUS
  Administered 2013-11-12: 5 mg via INTRAVENOUS

## 2013-11-12 MED ORDER — ACETAMINOPHEN 650 MG RE SUPP: 650.0000 mg | Freq: Four times a day (QID) | RECTAL | Status: DC | PRN

## 2013-11-12 MED ORDER — ACYCLOVIR 200 MG/5ML PO SUSP
800.0000 mg | Freq: Two times a day (BID) | ORAL | Status: DC
  Administered 2013-11-12 – 2013-11-13 (×2): 800 mg via ORAL
  Filled 2013-11-12 (×3): qty 20

## 2013-11-12 MED ORDER — CALCIUM CARBONATE-VITAMIN D 500-200 MG-UNIT PO TABS
1.0000 | ORAL_TABLET | Freq: Two times a day (BID) | ORAL | Status: DC
  Administered 2013-11-14 – 2013-11-16 (×5): 1 via ORAL
  Filled 2013-11-12 (×10): qty 1

## 2013-11-12 MED ORDER — DEXTROSE 5 % IV SOLN
INTRAVENOUS | Status: DC | PRN
  Administered 2013-11-12: 16:00:00 via INTRAVENOUS

## 2013-11-12 MED ORDER — PANTOPRAZOLE SODIUM 40 MG PO PACK
40.0000 mg | PACK | Freq: Every day | ORAL | Status: DC
  Administered 2013-11-12: 40 mg
  Filled 2013-11-12 (×2): qty 20

## 2013-11-12 MED ORDER — FUROSEMIDE 40 MG PO TABS
40.0000 mg | ORAL_TABLET | Freq: Every day | ORAL | Status: DC
  Filled 2013-11-12: qty 1

## 2013-11-12 MED ORDER — PROPOFOL 10 MG/ML IV BOLUS
INTRAVENOUS | Status: DC | PRN
  Administered 2013-11-12: 110 mg via INTRAVENOUS

## 2013-11-12 MED ORDER — ENOXAPARIN SODIUM 40 MG/0.4ML ~~LOC~~ SOLN
40.0000 mg | SUBCUTANEOUS | Status: DC
  Filled 2013-11-12: qty 0.4

## 2013-11-12 MED ORDER — DEXAMETHASONE SODIUM PHOSPHATE 10 MG/ML IJ SOLN
INTRAMUSCULAR | Status: AC
  Filled 2013-11-12: qty 1

## 2013-11-12 MED ORDER — BUPIVACAINE HCL (PF) 0.25 % IJ SOLN
INTRAMUSCULAR | Status: DC | PRN
  Administered 2013-11-12: 4 mL

## 2013-11-12 MED ORDER — POTASSIUM CHLORIDE IN NACL 20-0.9 MEQ/L-% IV SOLN
INTRAVENOUS | Status: DC
  Administered 2013-11-12: 19:00:00 via INTRAVENOUS
  Administered 2013-11-12 – 2013-11-13 (×2): 1000 mL via INTRAVENOUS
  Filled 2013-11-12 (×4): qty 1000

## 2013-11-12 MED ORDER — DULOXETINE HCL 60 MG PO CPEP
60.0000 mg | ORAL_CAPSULE | Freq: Two times a day (BID) | ORAL | Status: DC
  Administered 2013-11-13 – 2013-11-16 (×7): 60 mg via ORAL
  Filled 2013-11-12 (×10): qty 1

## 2013-11-12 MED ORDER — SODIUM CHLORIDE 0.9 % IV SOLN: INTRAVENOUS | Status: AC

## 2013-11-12 MED ORDER — 0.9 % SODIUM CHLORIDE (POUR BTL) OPTIME
TOPICAL | Status: DC | PRN
  Administered 2013-11-12: 1000 mL

## 2013-11-12 MED ORDER — PROPOFOL 10 MG/ML IV BOLUS
INTRAVENOUS | Status: AC
  Filled 2013-11-12: qty 20

## 2013-11-12 MED ORDER — SODIUM CHLORIDE 0.9 % IV SOLN
INTRAVENOUS | Status: DC
Start: 2013-11-12 — End: 2013-11-12
  Administered 2013-11-12: 03:00:00 via INTRAVENOUS

## 2013-11-12 MED ORDER — ALBUMIN HUMAN 5 % IV SOLN
INTRAVENOUS | Status: DC | PRN
  Administered 2013-11-12 (×2): via INTRAVENOUS

## 2013-11-12 MED ORDER — FENTANYL CITRATE 0.05 MG/ML IJ SOLN
INTRAMUSCULAR | Status: AC
  Filled 2013-11-12: qty 5

## 2013-11-12 MED ORDER — MENTHOL 3 MG MT LOZG: 1.0000 | LOZENGE | OROMUCOSAL | Status: DC | PRN

## 2013-11-12 MED ORDER — HYDROMORPHONE HCL PF 1 MG/ML IJ SOLN
0.5000 mg | INTRAMUSCULAR | Status: DC | PRN
  Administered 2013-11-12 – 2013-11-13 (×5): 1 mg via INTRAVENOUS
  Administered 2013-11-13: 0.5 mg via INTRAVENOUS
  Administered 2013-11-13 (×2): 1 mg via INTRAVENOUS
  Filled 2013-11-12 (×9): qty 1

## 2013-11-12 MED ORDER — ONDANSETRON HCL 4 MG/2ML IJ SOLN: 4.0000 mg | Freq: Four times a day (QID) | INTRAMUSCULAR | Status: DC | PRN

## 2013-11-12 MED ORDER — DEXMEDETOMIDINE HCL IN NACL 200 MCG/50ML IV SOLN
0.4000 ug/kg/h | INTRAVENOUS | Status: DC
  Administered 2013-11-12: 0.4 ug/kg/h via INTRAVENOUS
  Administered 2013-11-13: 0.5 ug/kg/h via INTRAVENOUS
  Administered 2013-11-13: 0.6 ug/kg/h via INTRAVENOUS
  Filled 2013-11-12 (×2): qty 50

## 2013-11-12 MED ORDER — PHENYLEPHRINE 40 MCG/ML (10ML) SYRINGE FOR IV PUSH (FOR BLOOD PRESSURE SUPPORT)
PREFILLED_SYRINGE | INTRAVENOUS | Status: AC
  Filled 2013-11-12: qty 10

## 2013-11-12 MED ORDER — ALPRAZOLAM 0.5 MG PO TABS
0.5000 mg | ORAL_TABLET | Freq: Three times a day (TID) | ORAL | Status: DC
  Administered 2013-11-12 – 2013-11-16 (×11): 0.5 mg via ORAL
  Filled 2013-11-12 (×11): qty 1

## 2013-11-12 MED ORDER — CEFAZOLIN SODIUM-DEXTROSE 2-3 GM-% IV SOLR
INTRAVENOUS | Status: DC | PRN
  Administered 2013-11-12: 2 g via INTRAVENOUS

## 2013-11-12 MED ORDER — THROMBIN 5000 UNITS EX SOLR
OROMUCOSAL | Status: DC | PRN
  Administered 2013-11-12: 17:00:00 via TOPICAL

## 2013-11-12 MED ORDER — PREGABALIN 50 MG PO CAPS
100.0000 mg | ORAL_CAPSULE | Freq: Three times a day (TID) | ORAL | Status: DC
  Administered 2013-11-13 – 2013-11-16 (×10): 100 mg via ORAL
  Filled 2013-11-12 (×10): qty 2

## 2013-11-12 MED ORDER — PANTOPRAZOLE SODIUM 40 MG PO TBEC: 40.0000 mg | DELAYED_RELEASE_TABLET | Freq: Two times a day (BID) | ORAL | Status: DC

## 2013-11-12 MED ORDER — FERROUS SULFATE 325 (65 FE) MG PO TABS
325.0000 mg | ORAL_TABLET | Freq: Every day | ORAL | Status: DC
  Administered 2013-11-14 – 2013-11-16 (×3): 325 mg via ORAL
  Filled 2013-11-12 (×5): qty 1

## 2013-11-12 MED ORDER — ONDANSETRON HCL 4 MG PO TABS: 4.0000 mg | ORAL_TABLET | Freq: Four times a day (QID) | ORAL | Status: DC | PRN

## 2013-11-12 MED ORDER — DEXAMETHASONE 4 MG PO TABS
4.0000 mg | ORAL_TABLET | Freq: Four times a day (QID) | ORAL | Status: DC
  Administered 2013-11-13 – 2013-11-16 (×13): 4 mg via ORAL
  Filled 2013-11-12 (×19): qty 1

## 2013-11-12 MED ORDER — ONDANSETRON HCL 4 MG/2ML IJ SOLN: 4.0000 mg | Freq: Once | INTRAMUSCULAR | Status: DC | PRN

## 2013-11-12 MED ORDER — CHLORHEXIDINE GLUCONATE 0.12 % MT SOLN
15.0000 mL | Freq: Two times a day (BID) | OROMUCOSAL | Status: DC
  Administered 2013-11-12: 15 mL via OROMUCOSAL
  Filled 2013-11-12: qty 15

## 2013-11-12 MED ORDER — NEOSTIGMINE METHYLSULFATE 1 MG/ML IJ SOLN
INTRAMUSCULAR | Status: AC
  Filled 2013-11-12: qty 20

## 2013-11-12 MED ORDER — SODIUM CHLORIDE 0.9 % IJ SOLN
3.0000 mL | INTRAMUSCULAR | Status: DC | PRN
  Administered 2013-11-12: 3 mL via INTRAVENOUS

## 2013-11-12 MED ORDER — SODIUM CHLORIDE 0.9 % IJ SOLN
3.0000 mL | Freq: Two times a day (BID) | INTRAMUSCULAR | Status: DC
  Administered 2013-11-12 – 2013-11-13 (×2): 3 mL via INTRAVENOUS

## 2013-11-12 MED ORDER — VECURONIUM BROMIDE 10 MG IV SOLR
INTRAVENOUS | Status: AC
  Filled 2013-11-12: qty 10

## 2013-11-12 MED ORDER — DILTIAZEM HCL ER BEADS 300 MG PO CP24
300.0000 mg | ORAL_CAPSULE | Freq: Every day | ORAL | Status: DC
  Administered 2013-11-13 – 2013-11-16 (×4): 300 mg via ORAL
  Filled 2013-11-12 (×5): qty 1

## 2013-11-12 MED ORDER — PHENYLEPHRINE 40 MCG/ML (10ML) SYRINGE FOR IV PUSH (FOR BLOOD PRESSURE SUPPORT)
PREFILLED_SYRINGE | INTRAVENOUS | Status: AC
  Filled 2013-11-12: qty 20

## 2013-11-12 MED ORDER — BIOTENE DRY MOUTH MT LIQD
15.0000 mL | Freq: Four times a day (QID) | OROMUCOSAL | Status: DC
  Administered 2013-11-12 – 2013-11-16 (×13): 15 mL via OROMUCOSAL

## 2013-11-12 MED ORDER — LIDOCAINE HCL (CARDIAC) 20 MG/ML IV SOLN
INTRAVENOUS | Status: DC | PRN
  Administered 2013-11-12: 80 mg via INTRAVENOUS

## 2013-11-12 MED ORDER — ALUM & MAG HYDROXIDE-SIMETH 200-200-20 MG/5ML PO SUSP: 30.0000 mL | Freq: Four times a day (QID) | ORAL | Status: DC | PRN

## 2013-11-12 MED ORDER — TIOTROPIUM BROMIDE MONOHYDRATE 18 MCG IN CAPS
18.0000 ug | ORAL_CAPSULE | Freq: Every day | RESPIRATORY_TRACT | Status: DC
  Administered 2013-11-12: 18 ug via RESPIRATORY_TRACT
  Filled 2013-11-12: qty 5

## 2013-11-12 MED ORDER — CEFAZOLIN SODIUM-DEXTROSE 2-3 GM-% IV SOLR
INTRAVENOUS | Status: AC
  Filled 2013-11-12: qty 50

## 2013-11-12 MED ORDER — EPHEDRINE SULFATE 50 MG/ML IJ SOLN
INTRAMUSCULAR | Status: AC
  Filled 2013-11-12: qty 1

## 2013-11-12 MED ORDER — DEXAMETHASONE SODIUM PHOSPHATE 4 MG/ML IJ SOLN
4.0000 mg | Freq: Four times a day (QID) | INTRAMUSCULAR | Status: DC
  Administered 2013-11-13 (×2): 4 mg via INTRAVENOUS
  Filled 2013-11-12 (×18): qty 1

## 2013-11-12 MED ORDER — OXYCODONE HCL 5 MG PO TABS
15.0000 mg | ORAL_TABLET | Freq: Four times a day (QID) | ORAL | Status: DC | PRN
  Administered 2013-11-13 – 2013-11-16 (×10): 15 mg via ORAL
  Filled 2013-11-12 (×10): qty 3

## 2013-11-12 MED ORDER — PHENOL 1.4 % MT LIQD: 1.0000 | OROMUCOSAL | Status: DC | PRN

## 2013-11-12 MED ORDER — ONDANSETRON HCL 4 MG/2ML IJ SOLN: 4.0000 mg | INTRAMUSCULAR | Status: DC | PRN

## 2013-11-12 MED ORDER — DEXAMETHASONE SODIUM PHOSPHATE 4 MG/ML IJ SOLN
INTRAMUSCULAR | Status: AC
  Filled 2013-11-12: qty 1

## 2013-11-12 MED ORDER — THROMBIN 20000 UNITS EX KIT
PACK | CUTANEOUS | Status: DC | PRN
  Administered 2013-11-12: 16:00:00 via TOPICAL

## 2013-11-12 MED ORDER — ALBUTEROL SULFATE (2.5 MG/3ML) 0.083% IN NEBU
2.5000 mg | INHALATION_SOLUTION | RESPIRATORY_TRACT | Status: DC | PRN
  Administered 2013-11-14: 2.5 mg via RESPIRATORY_TRACT
  Filled 2013-11-12: qty 3

## 2013-11-12 MED ORDER — POTASSIUM CHLORIDE CRYS ER 20 MEQ PO TBCR
20.0000 meq | EXTENDED_RELEASE_TABLET | Freq: Every day | ORAL | Status: DC
  Administered 2013-11-14 – 2013-11-16 (×3): 20 meq via ORAL
  Filled 2013-11-12 (×4): qty 1

## 2013-11-12 MED ORDER — LACTATED RINGERS IV SOLN
INTRAVENOUS | Status: DC | PRN
  Administered 2013-11-12 (×2): via INTRAVENOUS

## 2013-11-12 MED ORDER — EPHEDRINE SULFATE 50 MG/ML IJ SOLN
INTRAMUSCULAR | Status: DC | PRN
  Administered 2013-11-12 (×2): 5 mg via INTRAVENOUS

## 2013-11-12 MED ORDER — TRAZODONE HCL 150 MG PO TABS
150.0000 mg | ORAL_TABLET | Freq: Every day | ORAL | Status: DC
  Administered 2013-11-12 – 2013-11-15 (×4): 150 mg via ORAL
  Filled 2013-11-12 (×6): qty 1

## 2013-11-12 MED ORDER — HYDROMORPHONE HCL PF 1 MG/ML IJ SOLN: 0.2500 mg | INTRAMUSCULAR | Status: DC | PRN

## 2013-11-12 MED ORDER — FENTANYL CITRATE 0.05 MG/ML IJ SOLN
INTRAMUSCULAR | Status: DC | PRN
  Administered 2013-11-12 (×3): 100 ug via INTRAVENOUS
  Administered 2013-11-12: 50 ug via INTRAVENOUS

## 2013-11-12 MED ORDER — LINACLOTIDE 145 MCG PO CAPS
145.0000 ug | ORAL_CAPSULE | Freq: Every day | ORAL | Status: DC
  Administered 2013-11-13 – 2013-11-16 (×4): 145 ug via ORAL
  Filled 2013-11-12 (×5): qty 1

## 2013-11-12 MED ORDER — STERILE WATER FOR INJECTION IJ SOLN
INTRAMUSCULAR | Status: AC
  Filled 2013-11-12: qty 10

## 2013-11-12 MED ORDER — ACETAMINOPHEN 325 MG PO TABS
650.0000 mg | ORAL_TABLET | Freq: Four times a day (QID) | ORAL | Status: DC | PRN
  Administered 2013-11-13 (×2): 650 mg via ORAL
  Filled 2013-11-12 (×2): qty 2

## 2013-11-12 MED ORDER — ALBUTEROL SULFATE (2.5 MG/3ML) 0.083% IN NEBU
2.5000 mg | INHALATION_SOLUTION | Freq: Four times a day (QID) | RESPIRATORY_TRACT | Status: DC
  Administered 2013-11-12 (×2): 2.5 mg via RESPIRATORY_TRACT
  Filled 2013-11-12 (×3): qty 3

## 2013-11-12 MED ORDER — SODIUM CHLORIDE 0.9 % IV SOLN: 250.0000 mL | INTRAVENOUS | Status: DC

## 2013-11-12 MED ORDER — ARTIFICIAL TEARS OP OINT
TOPICAL_OINTMENT | OPHTHALMIC | Status: DC | PRN
  Administered 2013-11-12: 1 via OPHTHALMIC

## 2013-11-12 MED ORDER — IPRATROPIUM-ALBUTEROL 0.5-2.5 (3) MG/3ML IN SOLN
3.0000 mL | RESPIRATORY_TRACT | Status: DC
  Administered 2013-11-12 – 2013-11-13 (×3): 3 mL via RESPIRATORY_TRACT
  Filled 2013-11-12 (×3): qty 3

## 2013-11-12 MED ORDER — CEFAZOLIN SODIUM 1-5 GM-% IV SOLN
1.0000 g | Freq: Three times a day (TID) | INTRAVENOUS | Status: AC
  Administered 2013-11-13: 1 g via INTRAVENOUS
  Filled 2013-11-12: qty 50

## 2013-11-12 MED ORDER — LORATADINE 5 MG/5ML PO SYRP
10.0000 mg | ORAL_SOLUTION | Freq: Every day | ORAL | Status: DC
  Administered 2013-11-12 – 2013-11-14 (×3): 10 mg via ORAL
  Filled 2013-11-12 (×6): qty 10

## 2013-11-12 MED ORDER — GLYCOPYRROLATE 0.2 MG/ML IJ SOLN
INTRAMUSCULAR | Status: AC
  Filled 2013-11-12: qty 3

## 2013-11-12 MED ORDER — LORATADINE 10 MG PO TABS
10.0000 mg | ORAL_TABLET | Freq: Every day | ORAL | Status: DC
  Filled 2013-11-12: qty 1

## 2013-11-12 MED ORDER — BACITRACIN 50000 UNITS IM SOLR
INTRAMUSCULAR | Status: DC | PRN
  Administered 2013-11-12: 16:00:00

## 2013-11-12 MED ORDER — ONDANSETRON HCL 4 MG/2ML IJ SOLN
INTRAMUSCULAR | Status: DC | PRN
  Administered 2013-11-12: 4 mg via INTRAVENOUS

## 2013-11-12 MED ORDER — HEPARIN SODIUM (PORCINE) 5000 UNIT/ML IJ SOLN
5000.0000 [IU] | Freq: Three times a day (TID) | INTRAMUSCULAR | Status: DC
  Filled 2013-11-12 (×3): qty 1

## 2013-11-12 MED ORDER — LEVOTHYROXINE SODIUM 150 MCG PO TABS
150.0000 ug | ORAL_TABLET | Freq: Every day | ORAL | Status: DC
  Administered 2013-11-12 – 2013-11-16 (×4): 150 ug via ORAL
  Filled 2013-11-12 (×6): qty 1

## 2013-11-12 SURGICAL SUPPLY — 62 items
APL SKNCLS STERI-STRIP NONHPOA (GAUZE/BANDAGES/DRESSINGS)
BAG DECANTER FOR FLEXI CONT (MISCELLANEOUS) ×4 IMPLANT
BENZOIN TINCTURE PRP APPL 2/3 (GAUZE/BANDAGES/DRESSINGS) IMPLANT
BLADE SURG 11 STRL SS (BLADE) ×4 IMPLANT
BLADE ULTRA TIP 2M (BLADE) IMPLANT
BRUSH SCRUB EZ 1% IODOPHOR (MISCELLANEOUS) ×4 IMPLANT
BUR ACRON 5.0MM COATED (BURR) IMPLANT
BUR MATCHSTICK NEURO 3.0 LAGG (BURR) IMPLANT
CANISTER SUCTION 2500CC (MISCELLANEOUS) ×4 IMPLANT
CLIP TI MEDIUM 6 (CLIP) IMPLANT
CLOSURE WOUND 1/4X4 (GAUZE/BANDAGES/DRESSINGS)
CONT SPEC 4OZ CLIKSEAL STRL BL (MISCELLANEOUS) ×4 IMPLANT
COVER MAYO STAND STRL (DRAPES) IMPLANT
DRAPE LAPAROTOMY 100X72 PEDS (DRAPES) IMPLANT
DRAPE LAPAROTOMY 100X72X124 (DRAPES) IMPLANT
DRAPE MICROSCOPE LEICA (MISCELLANEOUS) IMPLANT
DRAPE POUCH INSTRU U-SHP 10X18 (DRAPES) ×4 IMPLANT
DRSG EMULSION OIL 3X3 NADH (GAUZE/BANDAGES/DRESSINGS) IMPLANT
ELECT REM PT RETURN 9FT ADLT (ELECTROSURGICAL) ×3
ELECTRODE REM PT RTRN 9FT ADLT (ELECTROSURGICAL) ×2 IMPLANT
GAUZE SPONGE 4X4 16PLY XRAY LF (GAUZE/BANDAGES/DRESSINGS) IMPLANT
GLOVE BIOGEL PI IND STRL 8 (GLOVE) ×2 IMPLANT
GLOVE BIOGEL PI INDICATOR 8 (GLOVE) ×2
GLOVE ECLIPSE 7.5 STRL STRAW (GLOVE) ×4 IMPLANT
GLOVE EXAM NITRILE LRG STRL (GLOVE) IMPLANT
GLOVE EXAM NITRILE MD LF STRL (GLOVE) IMPLANT
GLOVE EXAM NITRILE XL STR (GLOVE) IMPLANT
GLOVE EXAM NITRILE XS STR PU (GLOVE) IMPLANT
GOWN BRE IMP SLV AUR LG STRL (GOWN DISPOSABLE) IMPLANT
GOWN STRL REIN 2XL LVL4 (GOWN DISPOSABLE) IMPLANT
GOWN STRL REUS W/ TWL XL LVL3 (GOWN DISPOSABLE) IMPLANT
GOWN STRL REUS W/TWL XL LVL3 (GOWN DISPOSABLE)
HEMOSTAT SURGICEL 2X14 (HEMOSTASIS) IMPLANT
KIT BASIN OR (CUSTOM PROCEDURE TRAY) ×4 IMPLANT
KIT ROOM TURNOVER OR (KITS) ×4 IMPLANT
NDL SPNL 18GX3.5 QUINCKE PK (NEEDLE) ×2 IMPLANT
NDL SPNL 22GX3.5 QUINCKE BK (NEEDLE) ×1 IMPLANT
NEEDLE SPNL 18GX3.5 QUINCKE PK (NEEDLE) ×6 IMPLANT
NEEDLE SPNL 22GX3.5 QUINCKE BK (NEEDLE) ×3 IMPLANT
NS IRRIG 1000ML POUR BTL (IV SOLUTION) ×4 IMPLANT
PACK LAMINECTOMY NEURO (CUSTOM PROCEDURE TRAY) ×4 IMPLANT
PAD ARMBOARD 7.5X6 YLW CONV (MISCELLANEOUS) ×12 IMPLANT
PATTIES SURGICAL .25X.25 (GAUZE/BANDAGES/DRESSINGS) IMPLANT
PATTIES SURGICAL .5 X3 (DISPOSABLE) ×4 IMPLANT
PATTIES SURGICAL 1/4 X 3 (GAUZE/BANDAGES/DRESSINGS) ×4 IMPLANT
RUBBERBAND STERILE (MISCELLANEOUS) IMPLANT
SPECIMEN JAR SMALL (MISCELLANEOUS) IMPLANT
SPONGE GAUZE 4X4 12PLY (GAUZE/BANDAGES/DRESSINGS) IMPLANT
SPONGE LAP 4X18 X RAY DECT (DISPOSABLE) IMPLANT
SPONGE NEURO XRAY DETECT 1X3 (DISPOSABLE) IMPLANT
SPONGE SURGIFOAM ABS GEL 100 (HEMOSTASIS) ×4 IMPLANT
STRIP CLOSURE SKIN 1/4X4 (GAUZE/BANDAGES/DRESSINGS) IMPLANT
SUT PROLENE 6 0 BV (SUTURE) IMPLANT
SUT VIC AB 0 CT1 18XCR BRD8 (SUTURE) ×2 IMPLANT
SUT VIC AB 0 CT1 8-18 (SUTURE) ×3
SUT VIC AB 2-0 CP2 18 (SUTURE) ×4 IMPLANT
SYR 20ML ECCENTRIC (SYRINGE) ×4 IMPLANT
TIP SONASTAR STD MISONIX 1.9 (TRAY / TRAY PROCEDURE) IMPLANT
TOWEL OR 17X24 6PK STRL BLUE (TOWEL DISPOSABLE) ×4 IMPLANT
TOWEL OR 17X26 10 PK STRL BLUE (TOWEL DISPOSABLE) ×4 IMPLANT
TRAY FOLEY CATH 14FRSI W/METER (CATHETERS) IMPLANT
WATER STERILE IRR 1000ML POUR (IV SOLUTION) ×4 IMPLANT

## 2013-11-12 SURGICAL SUPPLY — 71 items
APL SKNCLS STERI-STRIP NONHPOA (GAUZE/BANDAGES/DRESSINGS) ×1
APL SRG 60D 8 XTD TIP BNDBL (TIP) ×1
BAG DECANTER FOR FLEXI CONT (MISCELLANEOUS) ×3 IMPLANT
BENZOIN TINCTURE PRP APPL 2/3 (GAUZE/BANDAGES/DRESSINGS) ×2 IMPLANT
BLADE SURG 11 STRL SS (BLADE) ×3 IMPLANT
BLADE ULTRA TIP 2M (BLADE) IMPLANT
BRUSH SCRUB EZ 1% IODOPHOR (MISCELLANEOUS) ×3 IMPLANT
BUR ACRON 5.0MM COATED (BURR) IMPLANT
BUR MATCHSTICK NEURO 3.0 LAGG (BURR) IMPLANT
CANISTER SUCT 3000ML (MISCELLANEOUS) ×3 IMPLANT
CLIP TI MEDIUM 6 (CLIP) IMPLANT
CLOSURE WOUND 1/4X4 (GAUZE/BANDAGES/DRESSINGS)
CONT SPEC 4OZ CLIKSEAL STRL BL (MISCELLANEOUS) ×3 IMPLANT
COVER MAYO STAND STRL (DRAPES) IMPLANT
DRAPE LAPAROTOMY 100X72 PEDS (DRAPES) IMPLANT
DRAPE LAPAROTOMY 100X72X124 (DRAPES) IMPLANT
DRAPE MICROSCOPE LEICA (MISCELLANEOUS) IMPLANT
DRAPE POUCH INSTRU U-SHP 10X18 (DRAPES) ×3 IMPLANT
DRSG EMULSION OIL 3X3 NADH (GAUZE/BANDAGES/DRESSINGS) IMPLANT
DRSG OPSITE POSTOP 3X4 (GAUZE/BANDAGES/DRESSINGS) ×2 IMPLANT
DURASEAL APPLICATOR TIP (TIP) ×2 IMPLANT
DURASEAL SPINE SEALANT 3ML (MISCELLANEOUS) ×2 IMPLANT
ELECT REM PT RETURN 9FT ADLT (ELECTROSURGICAL) ×3
ELECTRODE REM PT RTRN 9FT ADLT (ELECTROSURGICAL) ×1 IMPLANT
GAUZE SPONGE 4X4 16PLY XRAY LF (GAUZE/BANDAGES/DRESSINGS) IMPLANT
GLOVE BIO SURGEON STRL SZ7 (GLOVE) ×2 IMPLANT
GLOVE BIOGEL PI IND STRL 7.5 (GLOVE) IMPLANT
GLOVE BIOGEL PI IND STRL 8 (GLOVE) ×1 IMPLANT
GLOVE BIOGEL PI INDICATOR 7.5 (GLOVE) ×2
GLOVE BIOGEL PI INDICATOR 8 (GLOVE) ×2
GLOVE ECLIPSE 7.5 STRL STRAW (GLOVE) ×3 IMPLANT
GLOVE EXAM NITRILE LRG STRL (GLOVE) IMPLANT
GLOVE EXAM NITRILE MD LF STRL (GLOVE) IMPLANT
GLOVE EXAM NITRILE XL STR (GLOVE) IMPLANT
GLOVE EXAM NITRILE XS STR PU (GLOVE) IMPLANT
GOWN BRE IMP SLV AUR LG STRL (GOWN DISPOSABLE) IMPLANT
GOWN BRE IMP SLV AUR XL STRL (GOWN DISPOSABLE) IMPLANT
GOWN STRL REIN 2XL LVL4 (GOWN DISPOSABLE) IMPLANT
GOWN STRL REUS W/ TWL LRG LVL3 (GOWN DISPOSABLE) IMPLANT
GOWN STRL REUS W/TWL LRG LVL3 (GOWN DISPOSABLE) ×6
HEMOSTAT SURGICEL 2X14 (HEMOSTASIS) IMPLANT
KIT BASIN OR (CUSTOM PROCEDURE TRAY) ×3 IMPLANT
KIT ROOM TURNOVER OR (KITS) ×3 IMPLANT
NDL SPNL 18GX3.5 QUINCKE PK (NEEDLE) ×2 IMPLANT
NDL SPNL 22GX3.5 QUINCKE BK (NEEDLE) ×1 IMPLANT
NEEDLE SPNL 18GX3.5 QUINCKE PK (NEEDLE) ×6 IMPLANT
NEEDLE SPNL 22GX3.5 QUINCKE BK (NEEDLE) ×3 IMPLANT
NS IRRIG 1000ML POUR BTL (IV SOLUTION) ×3 IMPLANT
PACK LAMINECTOMY NEURO (CUSTOM PROCEDURE TRAY) ×3 IMPLANT
PAD ARMBOARD 7.5X6 YLW CONV (MISCELLANEOUS) ×9 IMPLANT
PATTIES SURGICAL .25X.25 (GAUZE/BANDAGES/DRESSINGS) IMPLANT
PATTIES SURGICAL .5 X3 (DISPOSABLE) ×3 IMPLANT
PATTIES SURGICAL 1/4 X 3 (GAUZE/BANDAGES/DRESSINGS) ×3 IMPLANT
RUBBERBAND STERILE (MISCELLANEOUS) IMPLANT
SPECIMEN JAR SMALL (MISCELLANEOUS) IMPLANT
SPONGE GAUZE 4X4 12PLY (GAUZE/BANDAGES/DRESSINGS) IMPLANT
SPONGE LAP 4X18 X RAY DECT (DISPOSABLE) IMPLANT
SPONGE NEURO XRAY DETECT 1X3 (DISPOSABLE) IMPLANT
SPONGE SURGIFOAM ABS GEL 100 (HEMOSTASIS) ×3 IMPLANT
STRIP CLOSURE SKIN 1/4X4 (GAUZE/BANDAGES/DRESSINGS) IMPLANT
SUT PROLENE 6 0 BV (SUTURE) IMPLANT
SUT VIC AB 0 CT1 18XCR BRD8 (SUTURE) ×1 IMPLANT
SUT VIC AB 0 CT1 8-18 (SUTURE) ×3
SUT VIC AB 2-0 CP2 18 (SUTURE) ×3 IMPLANT
SYR 20ML ECCENTRIC (SYRINGE) ×3 IMPLANT
TAPE STRIPS DRAPE STRL (GAUZE/BANDAGES/DRESSINGS) ×2 IMPLANT
TIP SONASTAR STD MISONIX 1.9 (TRAY / TRAY PROCEDURE) IMPLANT
TOWEL OR 17X24 6PK STRL BLUE (TOWEL DISPOSABLE) ×3 IMPLANT
TOWEL OR 17X26 10 PK STRL BLUE (TOWEL DISPOSABLE) ×3 IMPLANT
TRAY FOLEY CATH 14FRSI W/METER (CATHETERS) IMPLANT
WATER STERILE IRR 1000ML POUR (IV SOLUTION) ×3 IMPLANT

## 2013-11-12 NOTE — Anesthesia Preprocedure Evaluation (Addendum)
Anesthesia Evaluation  Patient identified by MRN, date of birth, ID band Patient awake    Reviewed: Allergy & Precautions, H&P , NPO status , Patient's Chart, lab work & pertinent test results  Airway       Dental   Pulmonary sleep apnea , COPDformer smoker,          Cardiovascular +CHF     Neuro/Psych Seizures -, Well Controlled,   Neuromuscular disease    GI/Hepatic GERD-  ,  Endo/Other  Hypothyroidism   Renal/GU      Musculoskeletal  (+) Fibromyalgia -  Abdominal   Peds  Hematology   Anesthesia Other Findings   Reproductive/Obstetrics                          Anesthesia Physical Anesthesia Plan  ASA: III  Anesthesia Plan: General   Post-op Pain Management:    Induction: Intravenous  Airway Management Planned: Oral ETT  Additional Equipment:   Intra-op Plan:   Post-operative Plan: Extubation in OR and Possible Post-op intubation/ventilation  Informed Consent: I have reviewed the patients History and Physical, chart, labs and discussed the procedure including the risks, benefits and alternatives for the proposed anesthesia with the patient or authorized representative who has indicated his/her understanding and acceptance.     Plan Discussed with:   Anesthesia Plan Comments:         Anesthesia Quick Evaluation

## 2013-11-12 NOTE — Consult Note (Signed)
Reason for Consult: Leg weakness  Referring Physician: Hospitalist  [name] is an 53 y.o. female.   HPI:  53 year old female with multiple medical problems including chronic thoracic pain related to previous chronic thoracic compression fractures presents with a week history of difficulty with gait with pain in the thoracic region and numbness in the legs. It appears she underwent some type of injection procedure in the thoracic spine in [place] at the pain clinic 9 days ago. 2 days later she started developing weakness in the legs with pain in the back. She has not walked for one week. The symptoms are progressive. She has had a progressive weakness in the legs with numbness from the abdomen down. The pain in her thoracic region is severe. She presented to the emergency department last night with these complaints. It appears she called the [facility] in the middle of last week complaining of her issues. The workup last night included an MRI of the lumbar spine. The PA in the emergency department actually called me to review the films. He stated that she had low back pain and weakness in her left leg only. I was not officially consulted simply asked to give my opinion of the MRI. I explained the MRI did not explained the findings that he was describing. I actually mentioned that I thought this may be coming from "something higher up." She was then admitted to the medicine service and they ordered an MRI of the thoracic spine which showed a lesion at T8, and urgent neurosurgical evaluation was requested.  Past Medical History  Diagnosis Date  . Right-sided heart failure   . COPD (chronic obstructive pulmonary disease)   . Emphysema   . Seizures   . Thyroid disease   . Depression   . Emphysema   . Arthritis     back and legs  . Blood transfusion without reported diagnosis   . High cholesterol   . Anxiety   . Nerves   . Panic attack   . Thyroid disorder   .  Hypothyroidism   . Pneumonia   . CHF (congestive heart failure)   . On home O2     4L N/C chronic  . Fibromyalgia     Past Surgical History  Procedure Laterality Date  . Abdominal hysterectomy    . Cesarean section    . Cholecystectomy    . Laser removal condyloma      Allergies  Allergen Reactions  . Tape Itching and Rash    History  Substance Use Topics  . Smoking status: Former Smoker    Quit date: 08/02/2013  . Smokeless tobacco: Never Used  . Alcohol Use: No    Family History  Problem Relation Age of Onset  . Cancer Father      Review of Systems  Positive ROS:  recent pneumonia in December  All other systems have been reviewed and were otherwise negative with the exception of those mentioned in the HPI and as above.  Objective: Vital signs in last 24 hours: Temp:  [98.3 F (36.8 C)-99.1 F (37.3 C)] 99.1 F (37.3 C) (03/21 1432) Pulse Rate:  [73-110] 110 (03/21 1432) Resp:  [11-22] 18 (03/21 1432) BP: (110-135)/(67-92) 115/80 mmHg (03/21 1432) SpO2:  [90 %-98 %] 93 % (03/21 1432) Weight:  [70.7 kg (155 lb 13.8 oz)] 70.7 kg (155 lb 13.8 oz) (03/21 0245)  General Appearance: Alert, cooperative,  some distress, appears stated age, abdominal breathing pattern  Head: Normocephalic,  without obvious abnormality, atraumatic Eyes: PERRL, conjunctiva/corneas clear, EOM's intact,       Throat:  thrush  Neck: Supple, symmetrical, trachea midline Lungs:  Donald breathing pattern Heart: Regular rate and rhythm Abdomen: Soft,  protuberant  Extremities:  skin changes with chronic thinning of the skin    NEUROLOGIC:   Mental status: A&O x4, no aphasia, good attention span, Memory and fund of knowledge Motor Exam - grossly normal, normal tone and bulkin the upper extremities with almost complete paraplegia of the lower extremities. She can wiggle her toes somewhat on the left. Has no proximal strength in the left lower extremity. Has some dorsiflexion and  plantarflexion strength on the right 2/5 strength in the hip flexors and knee extensors  Sensory Exam -  Decreased from the abdomen to the legs Reflexes:  No Hoffman's, No clonus Coordination -  unable to test in the lower extremities  Gait - unable to test  Balance -  unable to test  Cranial Nerves: I: smell Not tested  II: visual acuity  OS: na    OD: na  II: visual fields Full to confrontation  II: pupils Equal, round, reactive to light  III,VII: ptosis None  III,IV,VI: extraocular muscles  Full ROM  V: mastication Normal  V: facial light touch sensation  Normal  V,VII: corneal reflex  Present  VII: facial muscle function - upper  Normal  VII: facial muscle function - lower Normal  VIII: hearing Not tested  IX: soft palate elevation  Normal  IX,X: gag reflex Present  XI: trapezius strength  5/5  XI: sternocleidomastoid strength 5/5  XI: neck flexion strength  5/5  XII: tongue strength  Normal    Data Review Lab Results  Component Value Date   WBC 10.2 11/12/2013   HGB 13.9 11/12/2013   HCT 42.9 11/12/2013   MCV 103.1* 11/12/2013   PLT 207 11/12/2013   Lab Results  Component Value Date   NA 144 11/12/2013   K 3.8 11/12/2013   CL 98 11/12/2013   CO2 34* 11/12/2013   BUN 33* 11/12/2013   CREATININE 1.25* 11/12/2013   GLUCOSE 87 11/12/2013   Lab Results  Component Value Date   INR 0.90 10/18/2013    Radiology: Mr Cervical Spine Wo Contrast  11/12/2013   CLINICAL DATA:  Bilateral lower extremity weakness and loss of sensation.  EXAM: MRI CERVICAL AND thoracic SPINE WITHOUT CONTRAST  TECHNIQUE: Multiplanar and multiecho pulse sequences of the cervical spine, to include the craniocervical junction and cervicothoracic junction, and thoracic spine, were obtained without intravenous contrast.  COMPARISON:  Radiography 07/19/2013. MRI lumbar 11/11/2013. CT chest 09/19/2013.  FINDINGS: MRI CERVICAL SPINE FINDINGS  Alignment is normal. No disc pathology. The spinal canal is widely  patent. Ample subarachnoid space surrounds the cord. No osseous or articular pathology. No abnormal cord signal. No evidence of myelitis.  MRI thoracic SPINE FINDINGS  There is no significant finding at T6 or above. The disc at T6-7 shows a shallow protrusion towards the right but no apparent neural compression. There is an old minor compression deformity of the T7 vertebral body anteriorly without retropulsion.  The T8 vertebral body shows 80% loss of height. No visible marrow edema. There is minimal posterior bowing of the posterior margin of the vertebral body, 2 mm at the most. There are intraspinal lesions at this level. The largest is on the left measuring approximately 12 x 8 x 17 mm within the left posterior lateral quadrant of the spinal canal.  Within the right lateral side of the spinal canal at the T8-9 disc level, there is an approximately 7 mm in diameter entity. these show pronounced signal loss on T2 and STIR imaging. I cannot see evidence of any thing in this location on the CT scan of 09/19/2013, despite the fracture being present. The differential diagnosis includes intraspinal masses such as a meningioma, but I would have expected to see that on the previous CT. They could represent extruded disc material or intraspinal hematoma. In any case, there is significant mass effect upon the spinal cord, likely explaining the patient's clinical presentation.  There is mild loss of height at the T9 vertebral body anteriorly without edema. There are minor disc bulges in the lower thoracic spine below that but no compressive pathology.  IMPRESSION: Negative evaluation of the cervical spine.  80% compression fracture at T8. This fracture does not appear changed when compared to the CT scan of 09/19/2013. Minor old compression fractures at T7 and T9. Minimal posterior bowing at T8, approximately 2 mm. Abnormal structures within the spinal canal at the T8 level, larger on the left than on the right, which  appear to compress the spinal cord. I cannot see these on the previous chest CT. The differential diagnosis includes pre-existing masses such as meningioma. However, I would have expected to be able to see these on the previous chest CT. Therefore, they could represent intraspinal hematoma or extruded disc material. Urgent surgical consultation is suggested. If further imaging is desired, CT of this region may be of some use.  Critical Value/emergent results were called by telephone at the time of interpretation on 11/12/2013 at 1:55 PM to Dr. Roland Rack , who verbally acknowledged these results.   Electronically Signed   By: Nelson Chimes M.D.   On: 11/12/2013 14:00   Mr Thoracic Spine Wo Contrast  11/12/2013   CLINICAL DATA:  Bilateral lower extremity weakness and loss of sensation.  EXAM: MRI CERVICAL AND thoracic SPINE WITHOUT CONTRAST  TECHNIQUE: Multiplanar and multiecho pulse sequences of the cervical spine, to include the craniocervical junction and cervicothoracic junction, and thoracic spine, were obtained without intravenous contrast.  COMPARISON:  Radiography 07/19/2013. MRI lumbar 11/11/2013. CT chest 09/19/2013.  FINDINGS: MRI CERVICAL SPINE FINDINGS  Alignment is normal. No disc pathology. The spinal canal is widely patent. Ample subarachnoid space surrounds the cord. No osseous or articular pathology. No abnormal cord signal. No evidence of myelitis.  MRI thoracic SPINE FINDINGS  There is no significant finding at T6 or above. The disc at T6-7 shows a shallow protrusion towards the right but no apparent neural compression. There is an old minor compression deformity of the T7 vertebral body anteriorly without retropulsion.  The T8 vertebral body shows 80% loss of height. No visible marrow edema. There is minimal posterior bowing of the posterior margin of the vertebral body, 2 mm at the most. There are intraspinal lesions at this level. The largest is on the left measuring approximately 12  x 8 x 17 mm within the left posterior lateral quadrant of the spinal canal. Within the right lateral side of the spinal canal at the T8-9 disc level, there is an approximately 7 mm in diameter entity. these show pronounced signal loss on T2 and STIR imaging. I cannot see evidence of any thing in this location on the CT scan of 09/19/2013, despite the fracture being present. The differential diagnosis includes intraspinal masses such as a meningioma, but I would have expected to see that  on the previous CT. They could represent extruded disc material or intraspinal hematoma. In any case, there is significant mass effect upon the spinal cord, likely explaining the patient's clinical presentation.  There is mild loss of height at the T9 vertebral body anteriorly without edema. There are minor disc bulges in the lower thoracic spine below that but no compressive pathology.  IMPRESSION: Negative evaluation of the cervical spine.  80% compression fracture at T8. This fracture does not appear changed when compared to the CT scan of 09/19/2013. Minor old compression fractures at T7 and T9. Minimal posterior bowing at T8, approximately 2 mm. Abnormal structures within the spinal canal at the T8 level, larger on the left than on the right, which appear to compress the spinal cord. I cannot see these on the previous chest CT. The differential diagnosis includes pre-existing masses such as meningioma. However, I would have expected to be able to see these on the previous chest CT. Therefore, they could represent intraspinal hematoma or extruded disc material. Urgent surgical consultation is suggested. If further imaging is desired, CT of this region may be of some use.  Critical Value/emergent results were called by telephone at the time of interpretation on 11/12/2013 at 1:55 PM to Dr. Roland Rack , who verbally acknowledged these results.   Electronically Signed   By: Nelson Chimes M.D.   On: 11/12/2013 14:00   Mr  Lumbar Spine Wo Contrast  11/11/2013   CLINICAL DATA:  Low back pain, left leg weakness.  EXAM: MRI LUMBAR SPINE WITHOUT CONTRAST  TECHNIQUE: Multiplanar, multisequence MR imaging was performed. No intravenous contrast was administered.  COMPARISON:  Prior MRI from 11/09/2013.  FINDINGS: For the purposes of this dictation, the lowest well-formed vertebral body is presumed to be the L5-S1 level, and there presumed to be 5 lumbar type vertebral bodies.  Vertebral bodies are normally aligned with preservation of the normal lumbar lordosis. Vertebral body height is stable. Signal intensity within the vertebral body bone marrow is within normal limits. No acute osseous abnormality. Signal intensity within the visualized spinal cord is normal. The conus medullaris terminates at the L1 level.  At T10-11, seen only on sagittal projection, there is left-sided facet arthrosis with minimal disc bulge. No canal stenosis. There is question of mild left foraminal stenosis.  At T11-12, there is mild diffuse disc bulge without associated stenosis.  At T12-L1, small right paracentral disc protrusion again noted, unchanged. There is an associated annular fissure. Mild right foraminal stenosis also stable.  At L1-2, no disc bulge or disc protrusion. The intervertebral disc is well hydrated. No facet arthrosis. No canal or neural foraminal stenosis.  At L2-3, there is mild diffuse disc bulge with disc desiccation. No significant canal or neural foraminal stenosis. No facet arthrosis.  At L3-4, no disc bulge or disc protrusion. No significant facet arthrosis. No canal or neural foraminal stenosis.  L4-5, no disc bulge or disc protrusion. The intervertebral discs well hydrated. No significant facet arthrosis. No canal or neural foraminal stenosis.  L5-S1, no disc bulge or disc protrusion identified. Mild epidural lipomatosis noted. No significant facet arthrosis. No canal or neural foraminal stenosis.  Bilateral renal cystic disease  again noted, right greater than left. Visualized visceral structures are otherwise unremarkable.  IMPRESSION: 1. Stable appearance of the lumbar spine with no significant degenerative changes identified. Epidural at the level of L5 and the sacrum again noted. 2. Stable small right paracentral disc protrusion at T12-L1 with resultant mild right neural foraminal  stenosis. 3. Left-sided facet arthrosis at T10-11 with resultant mild left foraminal narrowing.   Electronically Signed   By: Jeannine Boga M.D.   On: 11/11/2013 23:39     Assessment/Plan:  I suspect given this history that she most likely has a thoracic epidural hematoma from a thoracic epidural steroid injection performed 9 days ago causing cord compression and paraplegia. Given the length of time she has been weak I think the likelihood of return of functional strength is small but I think urgent surgical decompression is  her the best chance of return of function. Also allows for diagnosis. This could be other things such as infection, disc protrusion, tumor, bone, or other lesions. I have explained all this to the patient in great detail. I've also spoken to her daughter-in-law at length. I've tried to answer their questions. We will get a stat CT scan to see if this shows essentially some other lesion but I think this is going to be an epidural hematoma. We will perform a thoracic laminectomy for evacuation of the lesion but they understand that if the need arises for thoracic fusion and they have given me permission to do so. They understand the risk of the surgery also include bleeding, infection, spinal cord injury, CSF leak, nerve root injury, numbness, weakness, paralysis, possible need for further surgery, instability, lack of relief of symptoms, worsening symptoms, kyphosis, vascular injury, incomplete evacuation of the lesion, and anesthesia risk including DVT pneumonia MI and death. She agreed to proceed.   Jarrett Chicoine  S 11/12/2013 2:46 PM

## 2013-11-12 NOTE — Consult Note (Signed)
PULMONARY  / CRITICAL CARE MEDICINE Consult Note  Name: [name] MRN: [id number] DOB: [birth year]    ADMISSION DATE:  11/11/2013 CONSULTATION DATE:  3/21  REFERRING MD :  Leonel Ramsay PRIMARY SERVICE: Hospitalist  CHIEF COMPLAINT:  Intubated s/p T8 laminectomy with resection of epidural mass  BRIEF PATIENT DESCRIPTION: 53 yo female who presented 3/21 with complaints of rapidly progressive weakness (bilateral lower extremities and left upper extremity) following spinal injection 9 days ago (for chronic pain)  SIGNIFICANT EVENTS / STUDIES:  T8 Laminectomy with resection of epidural mass 3/21  MRI of spine 3/20 and 3/21 Negative evaluation of the cervical spine.  80% compression fracture at T8. This fracture does not appear changed when compared to the CT scan of 09/19/2013. Minor old compression fractures at T7 and T9. Minimal posterior bowing at T8, approximately 2 mm. Abnormal structures within the spinal canal at the T8 level, larger on the left than on the right, which appear to compress the spinal cord. I cannot see these on the previous chest CT. The differential diagnosis includes pre-existing masses such as meningioma. However, I would have expected to be able to see these on the previous chest CT. Therefore, they could represent intraspinal hematoma or extruded disc material.   CT chest without contrast 3/21 Focal hyperdense collections at the T8 level, larger on the left than on the right, occupying a large portion of the spinal canal and compressing the spinal cord. The focal nature of the collections is unusual for either blood or usual injectate  in the epidural or even in the subdural space. Also, the density is higher than blood  normally measures.    LINES / TUBES: ET tube 3/21  CULTURES: Blood and urine 3/21 Pending  ANTIBIOTICS: None  HISTORY OF PRESENT ILLNESS:   53 yo female who presented 3/21 with complaints of rapidly progressive weakness (bilateral lower  extremities and left upper extremity) following spinal injection 9 days ago (for chronic pain).    At presentation elevated HR with leukocytosis 11K, otherwise no SIRS, afebrile, stable blood pressure.  Complained of lower back pain, weakness, urinary retention, and 2 falls.  Exam with muscle weakness, paresthesias.  PMH of CHF, COPD, Hypothyroidism, fibromyalgia, neuropathy, and chronic back pain.  MRI performed of lumbar spine due to concerns for cauda equina syndrome.  The following day, MRI of upper thorasic and cervical spine with evidence of T8 spinal compression from mass of unknown etiology.  Concerning as patient has received spinal injections in this area in the recent past.  Again, no signs of overt sepsis, patient taken to OR for removal of spinal mass with T8 laminectomy that was uneventful.  Critical care medicine consulted for ventilator management.  PAST MEDICAL HISTORY :  Past Medical History  Diagnosis Date  . Right-sided heart failure   . COPD (chronic obstructive pulmonary disease)   . Emphysema   . Seizures   . Thyroid disease   . Depression   . Emphysema   . Arthritis     back and legs  . Blood transfusion without reported diagnosis   . High cholesterol   . Anxiety   . Nerves   . Panic attack   . Thyroid disorder   . Hypothyroidism   . Pneumonia   . CHF (congestive heart failure)   . On home O2     4L N/C chronic  . Fibromyalgia    Past Surgical History  Procedure Laterality Date  . Abdominal hysterectomy    .  Cesarean section    . Cholecystectomy    . Laser removal condyloma     Prior to Admission medications   Medication Sig Start Date End Date Taking? Authorizing Provider  acyclovir (ZOVIRAX) 800 MG tablet Take 800 mg by mouth 2 (two) times daily.   Yes Historical Provider, MD  albuterol (PROVENTIL) (2.5 MG/3ML) 0.083% nebulizer solution Take 2.5 mg by nebulization 4 (four) times daily.   Yes Historical Provider, MD  ALPRAZolam Duanne Moron) 0.5 MG  tablet Take 0.5 mg by mouth 3 (three) times daily.    Yes Historical Provider, MD  Calcium Carb-Cholecalciferol (CALCIUM 600 + D PO) Take 1 tablet by mouth 2 (two) times daily.   Yes Historical Provider, MD  Cyanocobalamin (VITAMIN B-12) 5000 MCG SUBL Place 5,000 mcg under the tongue daily.   Yes Historical Provider, MD  diltiazem (TIAZAC) 300 MG 24 hr capsule Take 300 mg by mouth daily.   Yes Historical Provider, MD  DULoxetine (CYMBALTA) 60 MG capsule Take 60 mg by mouth 2 (two) times daily.   Yes Historical Provider, MD  ferrous sulfate 325 (65 FE) MG EC tablet Take 325 mg by mouth daily.   Yes Historical Provider, MD  Fluticasone-Salmeterol (ADVAIR) 250-50 MCG/DOSE AEPB Inhale 1 puff into the lungs every 12 (twelve) hours.   Yes Historical Provider, MD  furosemide (LASIX) 40 MG tablet Take 40 mg by mouth daily.   Yes Historical Provider, MD  levothyroxine (SYNTHROID, LEVOTHROID) 150 MCG tablet Take 150 mcg by mouth daily.   Yes Historical Provider, MD  Linaclotide Rolan Lipa) 145 MCG CAPS capsule Take 145 mcg by mouth daily.   Yes Historical Provider, MD  loratadine (CLARITIN) 10 MG tablet Take 10 mg by mouth at bedtime.   Yes Historical Provider, MD  oxyCODONE (ROXICODONE) 15 MG immediate release tablet Take 15 mg by mouth every 6 (six) hours as needed for pain.   Yes Historical Provider, MD  pantoprazole (PROTONIX) 40 MG tablet Take 1 tablet (40 mg total) by mouth 2 (two) times daily. 06/27/13  Yes Tiffany L Reed, DO  potassium chloride SA (K-DUR,KLOR-CON) 20 MEQ tablet Take 20 mEq by mouth daily.   Yes Historical Provider, MD  pregabalin (LYRICA) 100 MG capsule Take 100 mg by mouth 3 (three) times daily.   Yes Historical Provider, MD  tiotropium (SPIRIVA) 18 MCG inhalation capsule Place 18 mcg into inhaler and inhale daily.   Yes Historical Provider, MD  traZODone (DESYREL) 150 MG tablet Take 150 mg by mouth at bedtime.   Yes Historical Provider, MD   Allergies  Allergen Reactions  . Tape  Itching and Rash    FAMILY HISTORY:  Family History  Problem Relation Age of Onset  . Cancer Father    SOCIAL HISTORY:  reports that she quit smoking about 3 months ago. She has never used smokeless tobacco. She reports that she does not drink alcohol or use illicit drugs.  REVIEW OF SYSTEMS:  Unable to complete post op  SUBJECTIVE:   VITAL SIGNS: Temp:  [97.7 F (36.5 C)-99.1 F (37.3 C)] 97.7 F (36.5 C) (03/21 1808) Pulse Rate:  [92-110] 108 (03/21 2024) Resp:  [11-22] 18 (03/21 2024) BP: (114-143)/(67-92) 140/83 mmHg (03/21 2024) SpO2:  [90 %-98 %] 96 % (03/21 2024) FiO2 (%):  [50 %] 50 % (03/21 2024) Weight:  [155 lb 13.8 oz (70.7 kg)] 155 lb 13.8 oz (70.7 kg) (03/21 0245) HEMODYNAMICS:   VENTILATOR SETTINGS: Vent Mode:  [-] PRVC FiO2 (%):  [50 %] 50 %  Set Rate:  [14 bmp] 14 bmp Vt Set:  [400 mL] 400 mL PEEP:  [5 cmH20] 5 cmH20 Plateau Pressure:  [17 cmH20-20 cmH20] 20 cmH20 INTAKE / OUTPUT: Intake/Output     03/21 0701 - 03/22 0700   I.V. (mL/kg) 1638.8 (23.2)   Other 650   IV Piggyback 500   Total Intake(mL/kg) 2788.8 (39.4)   Urine (mL/kg/hr) 1400 (1.5)   Blood 100 (0.1)   Total Output 1500   Net +1288.8         PHYSICAL EXAMINATION: General:  NAD, afebrile, obese female, non toxic appearing.  Sedated Neuro:  Patient able to move bilateral UE, some weakness on left but unable to quantify (4/5 -3/5), weakness of right lower extremity unable to quantify (3/5-2/5), unable to move left lower extremity  HEENT:  PERRL, no sclearal icterus, no scleral edema, oral mucosa pink and moist Cardiovascular:  RRR, no MRG, normal S1S2 Lungs:  Patient with bilateral wheezes, comfortable on the ventilator Abdomen:  NT ND normal bowel sounds, no organomegaly Musculoskeletal:  Patient with Left lower extremity swelling Skin:  Patient without petechie, janeway lesions, or obvious skin break down.  Dressing dry and clean as per nurse (not turning patient  LABS:  Recent  Labs Lab 11/11/13 2009 11/11/13 2029 11/12/13 0620  HGB 14.6 14.6 13.9  WBC 11.7*  --  10.2  PLT 207  --  207  NA  --  139 144  K  --  4.0 3.8  CL  --  93* 98  CO2  --   --  34*  GLUCOSE  --  79 87  BUN  --  39* 33*  CREATININE  --  1.80* 1.25*  CALCIUM  --   --  8.6   No results found for this basename: GLUCAP,  in the last 168 hours  CXR: Pending  ASSESSMENT / PLAN:  PULMONARY A: Mechanical Ventilation s/p T8 laminectomy for perispinal mass with associated weakness  P:   Placed on PRVC Peak airway pressures <30 TV <10, goal 8 cc/kg to avoid secondary injury Avoid dyssynchrony; placed on precedex for comfort CXR to confirm ET tube placement Wean to pressure support Decrease I time with noted airtrapping  A:  COPD Patient with active wheezing Noted graphics with increased pressure on E phase On home Advair and Spiriva  P: Continue home meds  Duonebs Q2 hours wheezing Decreasing I time  CARDIOVASCULAR A: CHF P:  As per primary team  RENAL A:  CKD P:   Management as per primary team  GASTROINTESTINAL A:  PPI prophylaxis  P:   Continue while intuabted  HEMATOLOGIC A:  Left leg swelling with history of weakness (mild swelling) P:  Stat Ultrasound to rule out DVT No SCD's on left leg Foot pumps ordered through nursing instructions  Resume heparin once cleared by neurosurgery   INFECTIOUS A:  T8 mass P:   Work up for infection as per primary team  A:  HSV suppressive therapy P: Continue as per primary team  ENDOCRINE A:  Hypothyroidism   P:   Management as per primary team  NEUROLOGIC A:  Weakness S/p removal of T8 spinal mass Pending pathology  P:   As per primary team  Chronic pain and anxiety P: Continue home meds Monitor for withdraw symptoms  I have personally obtained a history, examined the patient, evaluated laboratory and imaging results, formulated the assessment and plan and placed orders.  Pulmonary and [place] Pager: [phone]  11/12/2013, 8:33 PM

## 2013-11-12 NOTE — Progress Notes (Addendum)
Patient Demographics  [name], is a 53 y.o. female, DOB - Jan 15, 1961, ZOX:[id number]  Admit date - 11/11/2013   Admitting Physician Theressa Millard, MD  Outpatient Primary MD for the patient is [name], NP  LOS - 1   Chief Complaint  Patient presents with  . Back Pain        Assessment & Plan      1. Left Leg Weakness with Ur.Retention ongoing for 5-7 days worse in the last 3 days when she was unable to move her left leg - has pain in the upper back, MRI L spine is non acute, will get a stat MRI C. and T. spine, have consulted neurology. Neurosurgery was consulted over the phone by the ER physician and they recommended medical workup for now.  Pending ESR, TSH, RPR, B12, and Folate Levels.    Addendum  MRI of T-spine back, suspicious for T-spine disc protrusion versus epidural hematoma, patient also provides history of epidural shots few weeks ago. Case discussed with neurosurgeon Dr. Ronnald Ramp. He is going to do a stat CT scan and then proceed with surgical intervention. There is a remote possibility this could be infectious, blood cultures have been ordered, if surgically there is evidence of infection we will add empiric antibiotics.      2. Upper back and old Low Back Pain /Lumbago- acute on chronic, pain Control with IV Dilaudid PRN. Check MRI of C. and T-spine stat, new to follow.     3. Urinary Retention- Foley catheter placed, likely due to the neurological problem as above, and Flomax.     4. COPD -stable , continue Albuterol Nebs, and Spiriva, Hold Symbicort Rx.      5. Chronic Diastolic CHF- stable.      6. Tobacco Abuse- Smoking Cessation discussed, Nicotine patch ordered while inpatient.      7. Fibromyalgia- chronic pain, PRN pain Rx.        Code Status: full  Family Communication:    Disposition Plan: To be decided   Procedures MRI L spine   Consults  Neuro Surgery   Medications  Scheduled Meds: . acyclovir  800 mg Oral BID  . albuterol  2.5 mg Nebulization QID  . ALPRAZolam  0.5 mg Oral TID  . calcium-vitamin D  1 tablet Oral BID  . diltiazem  300 mg Oral Daily  . DULoxetine  60 mg Oral BID  . ferrous sulfate  325 mg Oral Daily  . heparin subcutaneous  5,000 Units Subcutaneous 3 times per day  . levothyroxine  150 mcg Oral QAC breakfast  . Linaclotide  145 mcg Oral Daily  . loratadine  10 mg Oral QHS  . pantoprazole  40 mg Oral BID  . potassium chloride SA  20 mEq Oral Daily  . pregabalin  100 mg Oral TID  . sodium chloride  3 mL Intravenous Q12H  . tamsulosin  0.4 mg Oral QPC supper  . tiotropium  18 mcg Inhalation Q breakfast  . traZODone  150 mg Oral QHS   Continuous Infusions: . sodium chloride 75 mL/hr at 11/12/13 1006   PRN Meds:.acetaminophen, acetaminophen, alum & mag hydroxide-simeth, HYDROmorphone (DILAUDID) injection, ondansetron (ZOFRAN) IV, ondansetron, oxyCODONE, sodium chloride  DVT Prophylaxis  Heparin   Lab Results  Component Value Date   PLT 207 11/12/2013    Antibiotics    Anti-infectives   Start     Dose/Rate Route Frequency Ordered Stop   11/12/13 1000  acyclovir (ZOVIRAX) tablet 800 mg     800 mg Oral 2 times daily 11/12/13 0245            Subjective:   Meiya Wisler today has, No headache, No chest pain, No abdominal pain - No Nausea, No new weakness tingling or numbness, No Cough - SOB.   Objective:   Filed Vitals:   11/12/13 0215 11/12/13 0245 11/12/13 0644 11/12/13 1015  BP: 121/92 134/76 126/83 114/67  Pulse: 99 102 100 100  Temp:  98.5 F (36.9 C) 98.3 F (36.8 C) 98.6 F (37 C)  TempSrc:   Oral Oral  Resp: $Remo'16  16 14  'mPIrr$ Height:  $Remove'5\' 2"'oYLiUXd$  (1.575 m)    Weight:  70.7 kg (155 lb 13.8 oz)    SpO2: 95% 93% 97% 95%    Wt Readings from Last 3  Encounters:  11/12/13 70.7 kg (155 lb 13.8 oz)  11/01/13 72.122 kg (159 lb)  10/18/13 69.854 kg (154 lb)     Intake/Output Summary (Last 24 hours) at 11/12/13 1040 Last data filed at 11/11/13 2147  Gross per 24 hour  Intake      0 ml  Output   1800 ml  Net  -1800 ml     Physical Exam  Awake Alert, Oriented X 3, No new F.N deficits, L leg 1/5, Normal affect [place].AT,PERRAL Supple Neck,No JVD, No cervical lymphadenopathy appriciated.  Symmetrical Chest wall movement, Good air movement bilaterally, CTAB RRR,No Gallops,Rubs or new Murmurs, No Parasternal Heave, foley in place +ve B.Sounds, Abd Soft, Non tender, No organomegaly appriciated, No rebound - guarding or rigidity. No Cyanosis, Clubbing or edema, No new Rash or bruise      Data Review   Micro Results No results found for this or any previous visit (from the past 240 hour(s)).  Radiology Reports Mr Lumbar Spine Wo Contrast  11/11/2013   CLINICAL DATA:  Low back pain, left leg weakness.  EXAM: MRI LUMBAR SPINE WITHOUT CONTRAST  TECHNIQUE: Multiplanar, multisequence MR imaging was performed. No intravenous contrast was administered.  COMPARISON:  Prior MRI from 11/09/2013.  FINDINGS: For the purposes of this dictation, the lowest well-formed vertebral body is presumed to be the L5-S1 level, and there presumed to be 5 lumbar type vertebral bodies.  Vertebral bodies are normally aligned with preservation of the normal lumbar lordosis. Vertebral body height is stable. Signal intensity within the vertebral body bone marrow is within normal limits. No acute osseous abnormality. Signal intensity within the visualized spinal cord is normal. The conus medullaris terminates at the L1 level.  At T10-11, seen only on sagittal projection, there is left-sided facet arthrosis with minimal disc bulge. No canal stenosis. There is question of mild left foraminal stenosis.  At T11-12, there is mild diffuse disc bulge without associated stenosis.  At  T12-L1, small right paracentral disc protrusion again noted, unchanged. There is an associated annular fissure. Mild right foraminal stenosis also stable.  At L1-2, no disc bulge or disc protrusion. The intervertebral disc is well hydrated. No facet arthrosis. No canal or neural foraminal stenosis.  At L2-3, there is mild diffuse disc bulge with disc desiccation. No significant canal or neural foraminal stenosis. No facet arthrosis.  At L3-4, no disc bulge or disc protrusion. No significant  facet arthrosis. No canal or neural foraminal stenosis.  L4-5, no disc bulge or disc protrusion. The intervertebral discs well hydrated. No significant facet arthrosis. No canal or neural foraminal stenosis.  L5-S1, no disc bulge or disc protrusion identified. Mild epidural lipomatosis noted. No significant facet arthrosis. No canal or neural foraminal stenosis.  Bilateral renal cystic disease again noted, right greater than left. Visualized visceral structures are otherwise unremarkable.  IMPRESSION: 1. Stable appearance of the lumbar spine with no significant degenerative changes identified. Epidural at the level of L5 and the sacrum again noted. 2. Stable small right paracentral disc protrusion at T12-L1 with resultant mild right neural foraminal stenosis. 3. Left-sided facet arthrosis at T10-11 with resultant mild left foraminal narrowing.   Electronically Signed   By: Jeannine Boga M.D.   On: 11/11/2013 23:39    CBC  Recent Labs Lab 11/11/13 2009 11/11/13 2029 11/12/13 0620  WBC 11.7*  --  10.2  HGB 14.6 14.6 13.9  HCT 43.0 43.0 42.9  PLT 207  --  207  MCV 101.9*  --  103.1*  MCH 34.6*  --  33.4  MCHC 34.0  --  32.4  RDW 17.0*  --  17.2*  LYMPHSABS 1.5  --   --   MONOABS 1.2*  --   --   EOSABS 0.1  --   --   BASOSABS 0.0  --   --     Chemistries   Recent Labs Lab 11/11/13 2029 11/12/13 0620  NA 139 144  K 4.0 3.8  CL 93* 98  CO2  --  34*  GLUCOSE 79 87  BUN 39* 33*  CREATININE 1.80*  1.25*  CALCIUM  --  8.6   ------------------------------------------------------------------------------------------------------------------ estimated creatinine clearance is 49 ml/min (by C-G formula based on Cr of 1.25). ------------------------------------------------------------------------------------------------------------------ No results found for this basename: HGBA1C,  in the last 72 hours ------------------------------------------------------------------------------------------------------------------ No results found for this basename: CHOL, HDL, LDLCALC, TRIG, CHOLHDL, LDLDIRECT,  in the last 72 hours ------------------------------------------------------------------------------------------------------------------ No results found for this basename: TSH, T4TOTAL, FREET3, T3FREE, THYROIDAB,  in the last 72 hours ------------------------------------------------------------------------------------------------------------------ No results found for this basename: VITAMINB12, FOLATE, FERRITIN, TIBC, IRON, RETICCTPCT,  in the last 72 hours  Coagulation profile No results found for this basename: INR, PROTIME,  in the last 168 hours  No results found for this basename: DDIMER,  in the last 72 hours  Cardiac Enzymes No results found for this basename: CK, CKMB, TROPONINI, MYOGLOBIN,  in the last 168 hours ------------------------------------------------------------------------------------------------------------------ No components found with this basename: POCBNP,      Time Spent in minutes   35   SINGH,PRASHANT K M.D on 11/12/2013 at 10:40 AM  Between 7am to 7pm - Pager - [phone]  After 7pm go to [email or web address] - password [email or web address]  And look for the night coverage person covering for me after hours  [facility] Office  [phone]

## 2013-11-12 NOTE — Preoperative (Signed)
Beta Blockers   Reason not to administer Beta Blockers:Not Applicable 

## 2013-11-12 NOTE — Op Note (Signed)
11/11/2013 - 11/12/2013  5:38 PM  PATIENT:  [name]  53 y.o. female  PRE-OPERATIVE DIAGNOSIS:  T8 thoracic epidural mass with paraplegia  POST-OPERATIVE DIAGNOSIS:  Same  PROCEDURE:  T8 laminectomy for resection of epidural mass utilizing microscopic dissection  SURGEON:  Sherley Bounds, MD  ASSISTANTS: None  ANESTHESIA:   General  EBL: 100 ml  Total I/O In: 1550 [I.V.:1050; IV Piggyback:500] Out: 1300 [Urine:1200; Blood:100]  BLOOD ADMINISTERED:none  DRAINS: None   SPECIMEN:  Excision  INDICATION FOR PROCEDURE: This patient presented with a one-week history of progressive paraparesis with fairly dense paraparesis today. MRI and CT scan showed what appeared to be an epidural mass at T8. This is associated with chronic T7-T8 and T9 compression fractures. Recommend laminectomy for resection of the lesion. Patient understood the risks, benefits, and alternatives and potential outcomes and wished to proceed.  PROCEDURE DETAILS: The patient was taken to the operating room and after induction of adequate generalized endotracheal anesthesia, the patient was rolled into the prone position on the Wilson frame and all pressure points were padded. A localizing film was obtained. The thoracic region was cleaned and then prepped with DuraPrep and draped in the usual sterile fashion. 5 cc of local anesthesia was injected and then a dorsal midline incision was made and carried down to the thoracic fracture overlying T8. The fascia was opened and the paraspinous musculature was taken down in a subperiosteal fashion to expose the T7 and T8 bilaterally. Intraoperative x-ray confirmed my level, and then I used a combination of the high-speed drill and the Kerrison punches to perform a laminectomy, medial facetectomy, and foraminotomy at T8. The underlying yellow ligament was opened and removed in a piecemeal fashion to expose the underlying dura. There was a large left-sided compressive lesion that  was grayish and grainy. I initially felt this was going to be an epidural hematoma this lesion was obviously something such as a herniated disc or tumor. The operating microscope was brought into the field I used microscopic dissection to dissect the mass away from the dura. Removed several pieces and sent this for permanent pathology. This lesion tracked from the dorsal dura through the lateral recess to the ventral dura on the left. I found some tissue that I felt was abnormal in the right lateral recess also.  I undercut the lateral recess and dissected down until I was medial to and distal to the pedicle.  I then palpated with a nerve hook along the nerve root and into the foramen to assure adequate decompression. I saw and felt no more compression. I irrigated with saline solution containing bacitracin. Achieved hemostasis with bipolar cautery, lined the dura with Gelfoam, and then closed the fascia with 0 Vicryl. I closed the subcutaneous tissues with 2-0 Vicryl and the subcuticular tissues with 3-0 Vicryl. The skin was then closed with benzoin and Steri-Strips. The drapes were removed, a sterile dressing was applied. The patient was  transferred to the recovery room intubated but in stable condition. At the end of the procedure all sponge, needle and instrument counts were correct.   PLAN OF CARE: Admit to inpatient   PATIENT DISPOSITION:  ICU - intubated and hemodynamically stable.   Delay start of Pharmacological VTE agent (>24hrs) due to surgical blood loss or risk of bleeding:  yes

## 2013-11-12 NOTE — Anesthesia Postprocedure Evaluation (Signed)
  Anesthesia Post-op Note  Patient: [name]  Procedure(s) Performed: Procedure(s): THORACIC LAMINECTOMY FOR MASS (N/A)  Patient Location: NICU  Anesthesia Type:General  Level of Consciousness: sedated and Patient remains intubated per anesthesia plan  Airway and Oxygen Therapy: Patient remains intubated per anesthesia plan and Patient placed on Ventilator (see vital sign flow sheet for setting)  Post-op Pain: none  Post-op Assessment: Post-op Vital signs reviewed, Patient's Cardiovascular Status Stable, RESPIRATORY FUNCTION UNSTABLE, Patent Airway, No signs of Nausea or vomiting and Pain level controlled  Post-op Vital Signs: stable  Complications: No apparent anesthesia complications

## 2013-11-12 NOTE — H&P (Signed)
[facility] History and Physical  [name] [name]:[id number] DOB: August 27, 1960 DOA: 11/11/2013  Referring physician: EDP PCP: Kelsey Riches, NP  Specialists:   Chief Complaint:  Left leg Weakness  HPI: [name] is a 53 y.o. female with Multiple Medical Problems  Including COPD, CHF, Hypothyroid, Fibromyalgia, Chronic Low Back Pain and Peripheral Neuropathy who presents to the ED with complaints of progressive weakness of her left leg over 1 week worse over the past 3 days.  She report that her symptoms began when she felt a "shock to her body" on last Saturday, and by Wednesday of this week she had weakness and numbness in her left leg and was not able to walk.  She reports having 2 falls since, when others were trying to help her walk.  She reports having low back pain which increased after the falls.   She was unable to pass her urine today and came to the ED, and a straight cath was performed and 800 cc was removed.   An MRI of the Lumbar Spine was performed which returned and was discussed with Neuro Surgery On call and there results were negative for spinal stenosis or Cauda Equina Syndrome.   She was referred for medical admission.   She denies having any recent vaccinations.      Review of Systems:  Constitutional: No Weight Loss, No Weight Gain, Night Sweats, Fevers, Chills, Fatigue, or Generalized Weakness HEENT: No Headaches, Difficulty Swallowing,Tooth/Dental Problems,Sore Throat,  No Sneezing, Rhinitis, Ear Ache, Nasal Congestion, or Post Nasal Drip,  Cardio-vascular:  No Chest pain, Orthopnea, PND, Edema in lower extremities, Anasarca, Dizziness, Palpitations  Resp: No Dyspnea, No DOE, No Productive Cough, No Non-Productive Cough, No Hemoptysis, No Change in Color of Mucus,  No Wheezing.    GI: No Heartburn, Indigestion, Abdominal Pain, Nausea, Vomiting, Diarrhea, Change in Bowel Habits,  Loss of Appetite  GU: No Dysuria, Change in Color of Urine, No Urgency or Frequency,  +Urinary Retention,  o flank pain.  Musculoskeletal: No Joint Pain or Swelling, +Back Pain.  Neurologic: No Syncope, No Seizures, +Muscle Weakness, +Paresthesia, Vision Disturbance or Loss, No Diplopia, No Vertigo, +Difficulty Walking,  Skin: No Rash or Lesions. Psych: No Change in Mood or Affect. No Depression or Anxiety. No Memory loss. No Confusion or Hallucinations   Past Medical History  Diagnosis Date  . Right-sided heart failure   . COPD (chronic obstructive pulmonary disease)   . Emphysema   . Seizures   . Thyroid disease   . Depression   . Emphysema   . Arthritis     back and legs  . Blood transfusion without reported diagnosis   . High cholesterol   . Anxiety   . Nerves   . Panic attack   . Thyroid disorder   . Hypothyroidism   . Pneumonia   . CHF (congestive heart failure)   . On home O2     4L N/C chronic  . Fibromyalgia       Past Surgical History  Procedure Laterality Date  . Abdominal hysterectomy    . Cesarean section    . Cholecystectomy    . Laser removal condyloma         Prior to Admission medications   Medication Sig Start Date End Date Taking? Authorizing Provider  acyclovir (ZOVIRAX) 800 MG tablet Take 800 mg by mouth 2 (two) times daily.   Yes Historical Provider, MD  albuterol (PROVENTIL) (2.5 MG/3ML) 0.083% nebulizer solution Take 2.5 mg by  nebulization 4 (four) times daily.   Yes Historical Provider, MD  ALPRAZolam Duanne Moron) 0.5 MG tablet Take 0.5 mg by mouth 3 (three) times daily.    Yes Historical Provider, MD  Calcium Carb-Cholecalciferol (CALCIUM 600 + D PO) Take 1 tablet by mouth 2 (two) times daily.   Yes Historical Provider, MD  Cyanocobalamin (VITAMIN B-12) 5000 MCG SUBL Place 5,000 mcg under the tongue daily.   Yes Historical Provider, MD  diltiazem (TIAZAC) 300 MG 24 hr capsule Take 300 mg by mouth daily.   Yes Historical Provider, MD  DULoxetine (CYMBALTA) 60 MG capsule Take 60 mg by mouth 2 (two) times daily.   Yes Historical  Provider, MD  ferrous sulfate 325 (65 FE) MG EC tablet Take 325 mg by mouth daily.   Yes Historical Provider, MD  Fluticasone-Salmeterol (ADVAIR) 250-50 MCG/DOSE AEPB Inhale 1 puff into the lungs every 12 (twelve) hours.   Yes Historical Provider, MD  furosemide (LASIX) 40 MG tablet Take 40 mg by mouth daily.   Yes Historical Provider, MD  levothyroxine (SYNTHROID, LEVOTHROID) 150 MCG tablet Take 150 mcg by mouth daily.   Yes Historical Provider, MD  Linaclotide Rolan Lipa) 145 MCG CAPS capsule Take 145 mcg by mouth daily.   Yes Historical Provider, MD  loratadine (CLARITIN) 10 MG tablet Take 10 mg by mouth at bedtime.   Yes Historical Provider, MD  oxyCODONE (ROXICODONE) 15 MG immediate release tablet Take 15 mg by mouth every 6 (six) hours as needed for pain.   Yes Historical Provider, MD  pantoprazole (PROTONIX) 40 MG tablet Take 1 tablet (40 mg total) by mouth 2 (two) times daily. 06/27/13  Yes Tiffany L Reed, DO  potassium chloride SA (K-DUR,KLOR-CON) 20 MEQ tablet Take 20 mEq by mouth daily.   Yes Historical Provider, MD  pregabalin (LYRICA) 100 MG capsule Take 100 mg by mouth 3 (three) times daily.   Yes Historical Provider, MD  tiotropium (SPIRIVA) 18 MCG inhalation capsule Place 18 mcg into inhaler and inhale daily.   Yes Historical Provider, MD  traZODone (DESYREL) 150 MG tablet Take 150 mg by mouth at bedtime.   Yes Historical Provider, MD      Allergies  Allergen Reactions  . Tape Itching and Rash     Social History:  reports that she quit smoking about 3 months ago. She has never used smokeless tobacco. She reports that she does not drink alcohol or use illicit drugs.     Family History  Problem Relation Age of Onset  . Cancer Father       Physical Exam:  GEN:  Pleasant Obese  53 y.o. Caucasian female  examined  and in no acute distress; cooperative with exam Filed Vitals:   11/12/13 0100 11/12/13 0115 11/12/13 0145 11/12/13 0215  BP: 129/73 135/85 127/80 121/92   Pulse: 100 104 108 99  Temp:      TempSrc:      Resp: _0 SpO2: 92% 90% 94% 95%   Blood pressure 121/92, pulse 99, temperature 98.4 F (36.9 C), temperature source Oral, resp. rate 16, SpO2 95.00%. PSYCH: She is alert and oriented x4; does not appear anxious does not appear depressed; affect is normal HEENT: Normocephalic and Atraumatic, Mucous membranes pink; PERRLA; EOM intact; Fundi:  Benign;  No scleral icterus, Nares: Patent, Oropharynx: Clear, Fair Dentition, Neck:  FROM, no cervical lymphadenopathy nor thyromegaly or carotid bruit; no JVD; Breasts:: Not examined CHEST WALL: No tenderness CHEST: Normal respiration, clear to auscultation bilaterally  HEART: Regular rate and rhythm; no murmurs rubs or gallops BACK: No kyphosis or scoliosis; no CVA tenderness ABDOMEN: Positive Bowel Sounds,  Obese, soft non-tender; no masses, no organomegaly, no pannus; no intertriginous candida. Rectal Exam: Not done EXTREMITIES: No cyanosis, clubbing or edema; no ulcerations. Genitalia: not examined PULSES: 2+ and symmetric SKIN: Normal hydration no rash or ulceration CNS:   Mental Status:  Alert, oriented, thought content appropriate. Speech fluent without evidence of aphasia. Able to follow 3 step commands without difficulty. In No obvious pain.  Cranial Nerves:  Intact  Motor: Right: Upper extremity 5/5 Left: Upper extremity 5/5  Right Lower extremity 5/5 Left Lower extremity 0/5  Tone and bulk:normal tone throughout; no atrophy noted  Sensory: Decreasd Sensation in Left lower Extremity Deep Tendon Reflexes: No myoclonus Plantars/ Babinski: Right: equivocal    Left: equivocal    Cerebellar: Finger to nose with or without difficulty.  Gait: deferred  Vascular: pulses palpable throughout     Labs on Admission:  Basic Metabolic Panel:  Recent Labs Lab 11/11/13 2029  NA 139  K 4.0  CL 93*  GLUCOSE 79  BUN 39*  CREATININE 1.80*   Liver Function Tests: No results found  for this basename: AST, ALT, ALKPHOS, BILITOT, PROT, ALBUMIN,  in the last 168 hours No results found for this basename: LIPASE, AMYLASE,  in the last 168 hours No results found for this basename: AMMONIA,  in the last 168 hours CBC:  Recent Labs Lab 11/11/13 2009 11/11/13 2029  WBC 11.7*  --   NEUTROABS 8.9*  --   HGB 14.6 14.6  HCT 43.0 43.0  MCV 101.9*  --   PLT 207  --    Cardiac Enzymes: No results found for this basename: CKTOTAL, CKMB, CKMBINDEX, TROPONINI,  in the last 168 hours  BNP (last 3 results)  Recent Labs  08/20/13 1926 09/02/13 1145 09/19/13 1310  PROBNP 736.3* 622.9* 506.1*   CBG: No results found for this basename: GLUCAP,  in the last 168 hours  Radiological Exams on Admission: Mr Lumbar Spine Wo Contrast  11/11/2013   CLINICAL DATA:  Low back pain, left leg weakness.  EXAM: MRI LUMBAR SPINE WITHOUT CONTRAST  TECHNIQUE: Multiplanar, multisequence MR imaging was performed. No intravenous contrast was administered.  COMPARISON:  Prior MRI from 11/09/2013.  FINDINGS: For the purposes of this dictation, the lowest well-formed vertebral body is presumed to be the L5-S1 level, and there presumed to be 5 lumbar type vertebral bodies.  Vertebral bodies are normally aligned with preservation of the normal lumbar lordosis. Vertebral body height is stable. Signal intensity within the vertebral body bone marrow is within normal limits. No acute osseous abnormality. Signal intensity within the visualized spinal cord is normal. The conus medullaris terminates at the L1 level.  At T10-11, seen only on sagittal projection, there is left-sided facet arthrosis with minimal disc bulge. No canal stenosis. There is question of mild left foraminal stenosis.  At T11-12, there is mild diffuse disc bulge without associated stenosis.  At T12-L1, small right paracentral disc protrusion again noted, unchanged. There is an associated annular fissure. Mild right foraminal stenosis also stable.   At L1-2, no disc bulge or disc protrusion. The intervertebral disc is well hydrated. No facet arthrosis. No canal or neural foraminal stenosis.  At L2-3, there is mild diffuse disc bulge with disc desiccation. No significant canal or neural foraminal stenosis. No facet arthrosis.  At L3-4, no disc bulge or disc protrusion. No significant facet  arthrosis. No canal or neural foraminal stenosis.  L4-5, no disc bulge or disc protrusion. The intervertebral discs well hydrated. No significant facet arthrosis. No canal or neural foraminal stenosis.  L5-S1, no disc bulge or disc protrusion identified. Mild epidural lipomatosis noted. No significant facet arthrosis. No canal or neural foraminal stenosis.  Bilateral renal cystic disease again noted, right greater than left. Visualized visceral structures are otherwise unremarkable.  IMPRESSION: 1. Stable appearance of the lumbar spine with no significant degenerative changes identified. Epidural at the level of L5 and the sacrum again noted. 2. Stable small right paracentral disc protrusion at T12-L1 with resultant mild right neural foraminal stenosis. 3. Left-sided facet arthrosis at T10-11 with resultant mild left foraminal narrowing.   Electronically Signed   By: Jeannine Boga M.D.   On: 11/11/2013 23:39      Assessment/Plan:   53 y.o. female with  Principal Problem:   Left leg weakness Active Problems:   LOW BACK PAIN   Urinary retention   COPD without acute bronchospasm   Chronic diastolic congestive heart failure, NYHA class 1   TOBACCO ABUSE   FIBROMYALGIA    1.  Left Leg Weakness-  MRI negative for Spinal Stenosis or Cauda Equina Syndrome,  Neuro checks, and consult Neuro,   Transverse Myelitis or an Infarct should have been seen on an MRI.   Check ESR, TSH, RPR, B12, and Folate Levels.    2.  Low Back Pain /Lumbago- acute on chronic, pain Control with IV Dilaudid PRN.        3.  Urinary Retention-   Foley catheter placed,  Neurologic  versus Urologic Problem Primary vs Secondary.     4.  COPD -stable , continue Albuterol Nebs, and Spiriva, Hold Symbicort Rx.    5.   Chronic Diastolic CHF- stable.    6.   Tobacco Abuse- Smoking Cessation discussed, Nicotine patch ordered while inpatient.    7.  Fibromyalgia- chronic pain, PRN pain Rx.    8.  DVT prophylaxis with Lovenox.       Code Status:    FULL CODE Family Communication:   No Family Present  Disposition Plan:   Inpatient to Med/surg   Time spent:  [place] [facility] Pager [phone]  If 7PM-7AM, please contact night-coverage [email or web address] Password [facility] 11/12/2013, 2:31 AM

## 2013-11-12 NOTE — Consult Note (Signed)
Neurology Consultation Reason for Consult: Lower extremity weakness Referring Physician: Lala Lund  CC: Lower extremity weakness  History is obtained from: Patient   HPI: [name] is a 53 y.o. female with a history of progressive weakness over the past week. She states that she had an injection for pain and that she did began developing weakness and numbness in her lower extremities. This is been getting progressively worse. She then developed inability to urinate and therefore presented to the emergency room.    ROS: A 14 point ROS was performed and is negative except as noted in the HPI.  Past Medical History  Diagnosis Date  . Right-sided heart failure   . COPD (chronic obstructive pulmonary disease)   . Emphysema   . Seizures   . Thyroid disease   . Depression   . Emphysema   . Arthritis     back and legs  . Blood transfusion without reported diagnosis   . High cholesterol   . Anxiety   . Nerves   . Panic attack   . Thyroid disorder   . Hypothyroidism   . Pneumonia   . CHF (congestive heart failure)   . On home O2     4L N/C chronic  . Fibromyalgia     Exam: Current vital signs: BP 143/85  Pulse 106  Temp(Src) 97.7 F (36.5 C) (Axillary)  Resp 14  Ht 5\' 2"  (1.575 m)  Wt 70.7 kg (155 lb 13.8 oz)  BMI 28.50 kg/m2  SpO2 93% Vital signs in last 24 hours: Temp:  [97.7 F (36.5 C)-99.1 F (37.3 C)] 97.7 F (36.5 C) (03/21 1808) Pulse Rate:  [73-110] 106 (03/21 1945) Resp:  [11-22] 14 (03/21 1945) BP: (110-143)/(67-92) 143/85 mmHg (03/21 1945) SpO2:  [90 %-98 %] 93 % (03/21 1945) FiO2 (%):  [50 %] 50 % (03/21 1758) Weight:  [70.7 kg (155 lb 13.8 oz)] 70.7 kg (155 lb 13.8 oz) (03/21 0245)  General: In bed, NAD  CV: Regular rate and rhythm Mental Status: Patient is awake, alert, oriented to person, place, month, year, and situation. Immediate and remote memory are intact. Patient is able to give a clear and coherent history. No signs of  aphasia or neglect Cranial Nerves: II: Visual Fields are full. Pupils are equal, round, and reactive to light.  Discs are difficult to visualize. III,IV, VI: EOMI without ptosis or diploplia.  V: Facial sensation is symmetric to temperature VII: Facial movement is symmetric.  VIII: hearing is intact to voice X: Uvula elevates symmetrically XI: Shoulder shrug is symmetric. XII: tongue is midline without atrophy or fasciculations.  Motor: Tone is normal. Bulk is normal. 5/5 strength was present in the upper extremities. She has minimal abduction of her left leg, 2/5 strength in her right leg Sensory: Sensation is decreased from the abdomen down. She has absent vibration in the toes Deep Tendon Reflexes: 2+ and symmetric in the biceps and patellae. Decreased at the ankles Plantars: Toes are downgoing bilaterally.  Cerebellar: FNF intact bilaterally Gait: Not tested due to weakness   I have reviewed labs in epic and the results pertinent to this consultation are: Mildly elevated creatinine  I have reviewed the images obtained: MRI T-spine-compressive lesion  Impression: 53 year old female with a history of progressive lower extremity weakness and compressive lesion on MRI. Neurosurgery has been urgently consulted.  Recommendations: 1) agree with neurosurgery consultation 2) please call if any further neurological assistance as needed. Neurology will sign off   Roland Rack,  MD [facility] [phone]  If 7pm- 7am, please page neurology on call as listed in [place].

## 2013-11-12 NOTE — ED Provider Notes (Signed)
Medical screening examination/treatment/procedure(s) were performed by non-physician practitioner and as supervising physician I was immediately available for consultation/collaboration.   EKG Interpretation None        Merryl Hacker, MD 11/12/13 1459

## 2013-11-12 NOTE — Transfer of Care (Signed)
Immediate Anesthesia Transfer of Care Note  Patient: [name]  Procedure(s) Performed: Procedure(s): THORACIC LAMINECTOMY FOR MASS (N/A)  Patient Location: NICU  Anesthesia Type:General  Level of Consciousness: sedated and Patient remains intubated per anesthesia plan  Airway & Oxygen Therapy: Patient remains intubated per anesthesia plan and Patient placed on Ventilator (see vital sign flow sheet for setting)  Post-op Assessment: Report given to NICU RN , VSS , on Ventilator to wean to extubation .  Post vital signs: Reviewed and stable  Complications: No apparent anesthesia complications

## 2013-11-12 NOTE — Anesthesia Procedure Notes (Signed)
Procedure Name: Intubation Date/Time: 11/12/2013 3:55 PM Performed by: FOLEY, ROGER A Pre-anesthesia Checklist: Patient identified, Timeout performed, Emergency Drugs available, Suction available and Patient being monitored Patient Re-evaluated:Patient Re-evaluated prior to inductionOxygen Delivery Method: Circle system utilized Preoxygenation: Pre-oxygenation with 100% oxygen Intubation Type: IV induction and Cricoid Pressure applied Ventilation: Mask ventilation without difficulty and Oral airway inserted - appropriate to patient size Laryngoscope Size: Mac and 3 Grade View: Grade I Tube type: Subglottic suction tube Tube size: 7.5 mm Number of attempts: 1 Airway Equipment and Method: Stylet and LTA kit utilized Placement Confirmation: ETT inserted through vocal cords under direct vision,  breath sounds checked- equal and bilateral and positive ETCO2 Secured at: 22 cm Tube secured with: Tape Dental Injury: Teeth and Oropharynx as per pre-operative assessment      

## 2013-11-12 NOTE — Progress Notes (Signed)
Back from MRI tolerated procedure well after being medicated for pain with Dilaudid 1 mg IV.  Dr. Ronnald Ramp explained the need for and risks of emergent surgery. Patient acknowledged her understanding and agreement with his plan.  Transported to CT scan of Thoracic spine, then transported to Neuro OR, in stable condition.  Dentures removed and placed in cup at bedside.  Handoff to OR staff performed. Darelle Kings K

## 2013-11-12 NOTE — ED Notes (Signed)
Patient is currently unable to walk.

## 2013-11-13 DIAGNOSIS — J441 Chronic obstructive pulmonary disease with (acute) exacerbation: Secondary | ICD-10-CM

## 2013-11-13 DIAGNOSIS — G839 Paralytic syndrome, unspecified: Secondary | ICD-10-CM

## 2013-11-13 DIAGNOSIS — M7989 Other specified soft tissue disorders: Secondary | ICD-10-CM

## 2013-11-13 DIAGNOSIS — J9620 Acute and chronic respiratory failure, unspecified whether with hypoxia or hypercapnia: Secondary | ICD-10-CM

## 2013-11-13 LAB — URINE CULTURE
COLONY COUNT: NO GROWTH
CULTURE: NO GROWTH

## 2013-11-13 LAB — BLOOD GAS, ARTERIAL
Acid-Base Excess: 5.3 mmol/L — ABNORMAL HIGH (ref 0.0–2.0)
Bicarbonate: 29.1 mEq/L — ABNORMAL HIGH (ref 20.0–24.0)
DRAWN BY: 24513
FIO2: 40 %
LHR: 14 {breaths}/min
O2 SAT: 96.9 %
PATIENT TEMPERATURE: 100.1
PEEP/CPAP: 5 cmH2O
PO2 ART: 87.2 mmHg (ref 80.0–100.0)
TCO2: 30.4 mmol/L (ref 0–100)
VT: 400 mL
pCO2 arterial: 43.5 mmHg (ref 35.0–45.0)
pH, Arterial: 7.445 (ref 7.350–7.450)

## 2013-11-13 LAB — GLUCOSE, CAPILLARY
Glucose-Capillary: 105 mg/dL — ABNORMAL HIGH (ref 70–99)
Glucose-Capillary: 122 mg/dL — ABNORMAL HIGH (ref 70–99)
Glucose-Capillary: 127 mg/dL — ABNORMAL HIGH (ref 70–99)
Glucose-Capillary: 106 mg/dL — ABNORMAL HIGH (ref 70–99)
Glucose-Capillary: 129 mg/dL — ABNORMAL HIGH (ref 70–99)
Glucose-Capillary: 157 mg/dL — ABNORMAL HIGH (ref 70–99)

## 2013-11-13 LAB — BASIC METABOLIC PANEL
BUN: 26 mg/dL — AB (ref 6–23)
CO2: 30 meq/L (ref 19–32)
Calcium: 7.7 mg/dL — ABNORMAL LOW (ref 8.4–10.5)
Chloride: 103 mEq/L (ref 96–112)
Creatinine, Ser: 0.99 mg/dL (ref 0.50–1.10)
GFR calc Af Amer: 75 mL/min — ABNORMAL LOW (ref >90)
GFR calc non Af Amer: 65 mL/min — ABNORMAL LOW (ref >90)
Glucose, Bld: 113 mg/dL — ABNORMAL HIGH (ref 70–99)
POTASSIUM: 4.4 meq/L (ref 3.7–5.3)
SODIUM: 144 meq/L (ref 137–147)

## 2013-11-13 LAB — FOLATE RBC: RBC Folate: 1058 ng/mL — ABNORMAL HIGH (ref >280)

## 2013-11-13 LAB — CBC
HCT: 37.2 % (ref 36.0–46.0)
Hemoglobin: 12.2 g/dL (ref 12.0–15.0)
MCH: 33.3 pg (ref 26.0–34.0)
MCHC: 32.8 g/dL (ref 30.0–36.0)
MCV: 101.6 fL — AB (ref 78.0–100.0)
Platelets: 168 10*3/uL (ref 150–400)
RBC: 3.66 MIL/uL — AB (ref 3.87–5.11)
RDW: 16.8 % — ABNORMAL HIGH (ref 11.5–15.5)
WBC: 7.3 10*3/uL (ref 4.0–10.5)

## 2013-11-13 MED ORDER — SODIUM CHLORIDE 0.9 % IV BOLUS (SEPSIS)
500.0000 mL | Freq: Once | INTRAVENOUS | Status: AC
  Administered 2013-11-13: 500 mL via INTRAVENOUS

## 2013-11-13 MED ORDER — CHLORHEXIDINE GLUCONATE 0.12 % MT SOLN
15.0000 mL | Freq: Two times a day (BID) | OROMUCOSAL | Status: DC
  Administered 2013-11-14 – 2013-11-16 (×5): 15 mL via OROMUCOSAL
  Filled 2013-11-13 (×7): qty 15

## 2013-11-13 MED ORDER — BIOTENE DRY MOUTH MT LIQD: 15.0000 mL | Freq: Two times a day (BID) | OROMUCOSAL | Status: DC

## 2013-11-13 MED ORDER — IPRATROPIUM-ALBUTEROL 0.5-2.5 (3) MG/3ML IN SOLN
3.0000 mL | RESPIRATORY_TRACT | Status: DC
  Administered 2013-11-13 – 2013-11-14 (×6): 3 mL via RESPIRATORY_TRACT
  Filled 2013-11-13 (×6): qty 3

## 2013-11-13 MED ORDER — PANTOPRAZOLE SODIUM 40 MG PO TBEC
40.0000 mg | DELAYED_RELEASE_TABLET | Freq: Two times a day (BID) | ORAL | Status: DC
  Administered 2013-11-13 – 2013-11-16 (×7): 40 mg via ORAL
  Filled 2013-11-13 (×7): qty 1

## 2013-11-13 MED ORDER — ACYCLOVIR 800 MG PO TABS
800.0000 mg | ORAL_TABLET | Freq: Two times a day (BID) | ORAL | Status: DC
  Administered 2013-11-13 – 2013-11-16 (×7): 800 mg via ORAL
  Filled 2013-11-13 (×8): qty 1

## 2013-11-13 NOTE — Progress Notes (Signed)
*  PRELIMINARY RESULTS* Vascular Ultrasound Left lower extremity venous duplex has been completed.  Preliminary findings: No obvious evidence of DVT.  Landry Mellow, RDMS, RVT  11/13/2013, 8:52 AM

## 2013-11-13 NOTE — Progress Notes (Signed)
Patient ID: [name], female   DOB: 1960/09/14, 53 y.o.   MRN: [id number] Patient remains intubated but answer yes no questions. She denies significant pain. She is moving her legs better. She has antigravity strength in knee extension now on the left. She has antigravity strength in knee extension on the right probably now 4 minus to 4/5. He would was her foot better on the right than the left but there is a "flicker" of toe movement on the left. Certainly this is much better than preop. Difficult to test sensation while she is intubated. Very pleased with her progress. Awaiting pathology. Wean to extubate and start therapy.

## 2013-11-13 NOTE — Progress Notes (Signed)
PULMONARY  / CRITICAL CARE MEDICINE Consult Note  Name: [name] MRN: [id number] DOB: August 10, 1961    ADMISSION DATE:  11/11/2013 CONSULTATION DATE:  3/21  REFERRING MD :  Leonel Ramsay PRIMARY SERVICE: Hospitalist  CHIEF COMPLAINT:  Intubated s/p T8 laminectomy with resection of epidural mass  BRIEF PATIENT DESCRIPTION: 53 yo female who presented 3/21 with complaints of rapidly progressive weakness (bilateral lower extremities and left upper extremity) following spinal injection 9 days ago (for chronic pain)  PMH - smoker, severe copd, recent admissions for flu & HCAP  SIGNIFICANT EVENTS / STUDIES:  T8 Laminectomy with resection of epidural mass 3/21  MRI of spine 3/20 and 3/21 Negative evaluation of the cervical spine.  80% compression fracture at T8. This fracture does not appear changed when compared to the CT scan of 09/19/2013. Minor old compression fractures at T7 and T9. Minimal posterior bowing at T8, approximately 2 mm. Abnormal structures within the spinal canal at the T8 level, larger on the left than on the right, which appear to compress the spinal cord. I cannot see these on the previous chest CT. The differential diagnosis includes pre-existing masses such as meningioma. However, I would have expected to be able to see these on the previous chest CT. Therefore, they could represent intraspinal hematoma or extruded disc material.   CT chest without contrast 3/21 Focal hyperdense collections at the T8 level, larger on the left than on the right, occupying a large portion of the spinal canal and compressing the spinal cord. The focal nature of the collections is unusual for either blood or usual injectate  in the epidural or even in the subdural space. Also, the density is higher than blood  normally measures.    3/22 duplex LLE neg  LINES / TUBES: ET tube 3/21 >>  CULTURES: Blood 3/21 >>  urine 3/21>>  ANTIBIOTICS: None  SUBJECTIVE: denies pain, Bp  better Breathing 'ok'  VITAL SIGNS: Temp:  [97.7 F (36.5 C)-101 F (38.3 C)] 99 F (37.2 C) (03/22 0810) Pulse Rate:  [92-127] 116 (03/22 0900) Resp:  [13-29] 27 (03/22 0900) BP: (84-143)/(49-91) 117/70 mmHg (03/22 0900) SpO2:  [93 %-99 %] 97 % (03/22 0900) FiO2 (%):  [40 %-50 %] 40 % (03/22 0810) HEMODYNAMICS:   VENTILATOR SETTINGS: Vent Mode:  [-] PSV;CPAP FiO2 (%):  [40 %-50 %] 40 % Set Rate:  [14 bmp] 14 bmp Vt Set:  [400 mL] 400 mL PEEP:  [5 cmH20] 5 cmH20 Pressure Support:  [5 cmH20] 5 cmH20 Plateau Pressure:  [14 cmH20-20 cmH20] 14 cmH20 INTAKE / OUTPUT: Intake/Output     03/21 0701 - 03/22 0700 03/22 0701 - 03/23 0700   I.V. (mL/kg) 2470.7 (34.9) 150 (2.1)   IV Piggyback 1600    Total Intake(mL/kg) 4070.7 (57.6) 150 (2.1)   Urine (mL/kg/hr) 2800 (1.7)    Emesis/NG output 300 (0.2)    Blood 100 (0.1)    Total Output 3200     Net +870.7 +150          PHYSICAL EXAMINATION: General:  NAD, afebrile, obese female, chronically ill appearing. Neuro:  Patient able to move bilateral UE, moving BL feet 3/5 HEENT:  PERRL, no sclearal icterus, no scleral edema, oral mucosa pink and moist Cardiovascular:  RRR, no MRG, normal S1S2 Lungs:  Patient with bilateral rhonchi , comfortable on PS 5/5 Abdomen:  NT ND normal bowel sounds, no organomegaly Musculoskeletal:  Left lower extremity swelling Skin:  Patient without petechie, janeway lesions, or obvious skin  break down.  Dressing dry and clean as per nurse (not turning patient  LABS:  Recent Labs Lab 11/11/13 2009  11/11/13 2029 11/12/13 0620 11/12/13 2137 11/13/13 0252 11/13/13 0344  HGB 14.6  --  14.6 13.9  --  12.2  --   WBC 11.7*  --   --  10.2  --  7.3  --   PLT 207  --   --  207  --  168  --   NA  --   --  139 144  --  144  --   K  --   < > 4.0 3.8  --  4.4  --   CL  --   --  93* 98  --  103  --   CO2  --   --   --  34*  --  30  --   GLUCOSE  --   --  79 87  --  113*  --   BUN  --   --  39* 33*  --  26*   --   CREATININE  --   --  1.80* 1.25*  --  0.99  --   CALCIUM  --   --   --  8.6  --  7.7*  --   PHART  --   --   --   --  7.414  --  7.445  PCO2ART  --   --   --   --  52.8*  --  43.5  PO2ART  --   --   --   --  82.7  --  87.2  < > = values in this interval not displayed.  Recent Labs Lab 11/12/13 2015 11/13/13 0331 11/13/13 0821  GLUCAP 134* 105* 122*    CXR: Pending  ASSESSMENT / PLAN:  PULMONARY A: Mechanical Ventilation s/p T8 laminectomy for perispinal mass with associated weakness Severe COPD with recent flu / HCAP 08/2013 P:   Extubate Resume home Advair and Spiriva in 24h Continue Duonebs Q2 hours wheezing   CARDIOVASCULAR A: CHF P:  As per primary team  RENAL A:  CKD P:   Management as per primary team  GASTROINTESTINAL A:  PPI prophylaxis  P:   Continue while intuabted  HEMATOLOGIC A:  Left leg swelling with history of weakness (mild swelling) Ultrasound neg DVT P:  No SCD's on left leg Foot pumps  Resume heparin once cleared by neurosurgery   INFECTIOUS A:  T8 mass P:   Work up for infection as per primary team  A:  HSV suppressive therapy P: Continue as per primary team  ENDOCRINE A:  Hypothyroidism   P:   Management as per primary team  NEUROLOGIC A:  Weakness S/p removal of T8 spinal mass Pending pathology  P:   As per primary team  Chronic pain and anxiety P: Continue home meds Monitor for withdraw symptoms  I have personally obtained a history, examined the patient, evaluated laboratory and imaging results, formulated the assessment and plan and placed orders.  Kara Mead MD. Shade Flood. [facility] Pager [phone] If no response call [phone]    11/13/2013, 9:14 AM

## 2013-11-13 NOTE — Progress Notes (Deleted)
Pt was pointing to chest and shook head that he wanted to be NTS. RT received moderates amount of tan; Rebie Peale secretions. Pt felt better after. Pt was taken off BiPAP and placed on 3 LPM [place]. Pt tolerating well at this time. RT will continue to monitor pt.  

## 2013-11-13 NOTE — Procedures (Signed)
Extubation Procedure Note  Patient Details:   Name: [name] DOB: 06/11/1961 MRN: [id number]   Airway Documentation:     Evaluation  O2 sats: stable throughout Complications: No apparent complications Patient did tolerate procedure well. Bilateral Breath Sounds: Clear;Diminished Suctioning: Oral;Airway Yes  Pt was extubated and placed on 4 LPM [place], which is what pt wears at home. Pt was able to vocalize name and had a strong, productive cough. Pt has clear; diminished BBS. Vitals are stable. SPO2 is 95%. RT will continue to monitor.   Hadlyn Amero M 11/13/2013, 9:53 AM

## 2013-11-14 DIAGNOSIS — R0609 Other forms of dyspnea: Secondary | ICD-10-CM

## 2013-11-14 DIAGNOSIS — R0989 Other specified symptoms and signs involving the circulatory and respiratory systems: Secondary | ICD-10-CM

## 2013-11-14 LAB — CBC
HCT: 33.3 % — ABNORMAL LOW (ref 36.0–46.0)
Hemoglobin: 11 g/dL — ABNORMAL LOW (ref 12.0–15.0)
MCH: 33.7 pg (ref 26.0–34.0)
MCHC: 33 g/dL (ref 30.0–36.0)
MCV: 102.1 fL — ABNORMAL HIGH (ref 78.0–100.0)
PLATELETS: 165 10*3/uL (ref 150–400)
RBC: 3.26 MIL/uL — ABNORMAL LOW (ref 3.87–5.11)
RDW: 16.4 % — ABNORMAL HIGH (ref 11.5–15.5)
WBC: 5.5 10*3/uL (ref 4.0–10.5)

## 2013-11-14 LAB — BASIC METABOLIC PANEL
BUN: 30 mg/dL — ABNORMAL HIGH (ref 6–23)
CO2: 27 mEq/L (ref 19–32)
CREATININE: 0.93 mg/dL (ref 0.50–1.10)
Calcium: 8 mg/dL — ABNORMAL LOW (ref 8.4–10.5)
Chloride: 108 mEq/L (ref 96–112)
GFR calc non Af Amer: 70 mL/min — ABNORMAL LOW (ref >90)
GFR, EST AFRICAN AMERICAN: 81 mL/min — AB (ref >90)
Glucose, Bld: 140 mg/dL — ABNORMAL HIGH (ref 70–99)
Potassium: 4.5 mEq/L (ref 3.7–5.3)
Sodium: 145 mEq/L (ref 137–147)

## 2013-11-14 LAB — GLUCOSE, CAPILLARY: Glucose-Capillary: 139 mg/dL — ABNORMAL HIGH (ref 70–99)

## 2013-11-14 MED ORDER — SENNOSIDES-DOCUSATE SODIUM 8.6-50 MG PO TABS
1.0000 | ORAL_TABLET | Freq: Once | ORAL | Status: AC
  Administered 2013-11-14: 1 via ORAL
  Filled 2013-11-14: qty 1

## 2013-11-14 MED ORDER — POLYETHYLENE GLYCOL 3350 17 G PO PACK
17.0000 g | PACK | Freq: Once | ORAL | Status: AC
  Administered 2013-11-14: 17 g via ORAL
  Filled 2013-11-14: qty 1

## 2013-11-14 MED ORDER — TIOTROPIUM BROMIDE MONOHYDRATE 18 MCG IN CAPS
18.0000 ug | ORAL_CAPSULE | Freq: Every day | RESPIRATORY_TRACT | Status: DC
  Administered 2013-11-14 – 2013-11-16 (×3): 18 ug via RESPIRATORY_TRACT
  Filled 2013-11-14: qty 5

## 2013-11-14 MED ORDER — NICOTINE 14 MG/24HR TD PT24
14.0000 mg | MEDICATED_PATCH | Freq: Every day | TRANSDERMAL | Status: DC
  Administered 2013-11-14 – 2013-11-15 (×2): 14 mg via TRANSDERMAL
  Filled 2013-11-14 (×3): qty 1

## 2013-11-14 MED ORDER — MOMETASONE FURO-FORMOTEROL FUM 100-5 MCG/ACT IN AERO
2.0000 | INHALATION_SPRAY | Freq: Two times a day (BID) | RESPIRATORY_TRACT | Status: DC
  Administered 2013-11-14 – 2013-11-16 (×4): 2 via RESPIRATORY_TRACT
  Filled 2013-11-14: qty 8.8

## 2013-11-14 NOTE — Progress Notes (Signed)
Rehab Admissions Coordinator Note:  Patient was screened by Cleatrice Burke for appropriateness for an Inpatient Acute Rehab Consult per PT recommendation.  At this time, we are recommending Inpatient Rehab consult. Please place order.  Cleatrice Burke 11/14/2013, 1:04 PM  I can be reached at [phone].

## 2013-11-14 NOTE — Progress Notes (Signed)
Patient ID: [name], female   DOB: [birth year], 53 y.o.   MRN: [id number] Subjective: Patient reports appropriate soreness. Some tingling in the legs. Strength is improving. She seems pleased with her progress.  Objective: Vital signs in last 24 hours: Temp:  [97.9 F (36.6 C)-98.7 F (37.1 C)] 97.9 F (36.6 C) (03/23 0337) Pulse Rate:  [77-118] 79 (03/23 0800) Resp:  [10-23] 11 (03/23 0800) BP: (79-127)/(48-88) 94/62 mmHg (03/23 0800) SpO2:  [90 %-97 %] 95 % (03/23 0800)  Intake/Output from previous day: 03/22 0701 - 03/23 0700 In: 1800 [I.V.:1800] Out: 1205 [Urine:1205] Intake/Output this shift: Total I/O In: 75 [I.V.:75] Out: 150 [NGEXB:284]  He is certainly getting stronger in her lower extremities. She can now wiggle her left toes. She has some 2 out over 5 dorsi and plantarflexion on the left. 3/5 dorsi and plantarflexion on the right. She has antigravity strength now and her quadriceps bilaterally. Still weak proximally and the hip flexors left more than right sensation is improving  Lab Results: Lab Results  Component Value Date   WBC 5.5 11/14/2013   HGB 11.0* 11/14/2013   HCT 33.3* 11/14/2013   MCV 102.1* 11/14/2013   PLT 165 11/14/2013   Lab Results  Component Value Date   INR 0.90 10/18/2013   BMET Lab Results  Component Value Date   NA 145 11/14/2013   K 4.5 11/14/2013   CL 108 11/14/2013   CO2 27 11/14/2013   GLUCOSE 140* 11/14/2013   BUN 30* 11/14/2013   CREATININE 0.93 11/14/2013   CALCIUM 8.0* 11/14/2013    Studies/Results: Dg Thoracic Spine 2 View  11/12/2013   CLINICAL DATA:  Localizer for thoracic spinal surgery.  EXAM: THORACIC SPINE - 2 VIEW  COMPARISON:  11/12/2013 CT and MR  FINDINGS: Two intraoperative lateral views of the thoracic spine are submitted postoperatively for interpretation.  Film #1 demonstrates a posterior metallic probe with tip at the the T9-T10 level.  Film #2 demonstrates a posterior metallic probe with tip at the T7-T8 level.   IMPRESSION: Publishing copy Signed   By: Hassan Rowan M.D.   On: 11/12/2013 18:54   Ct Thoracic Spine Wo Contrast  11/12/2013   CLINICAL DATA:  Loss of strength and feeling in the lower extremities. Abnormal MRI. Additional history now available is that the patient had recent thoracic spinal injections in [facility].  EXAM: CT THORACIC SPINE WITHOUT CONTRAST  TECHNIQUE: Multidetector CT imaging of the thoracic spine was performed without intravenous contrast administration. Multiplanar CT image reconstructions were also generated.  COMPARISON:  MRI same day  FINDINGS: The CT scan again shows minor compression deformities at T7 and T9 an a more advanced compression fracture at T8. At T8 there is loss of height of 80%. Retropulsion of 2-3 mm at this level. There are well-circumscribed hyperdense collections within the spinal canal both on the left and on the right at this level. On the left, this measures 14 x 9 x 14 mm. On the right, this measures approximately 6 x 5 x 5 mm. The collections are quite well-circumscribed. They could represent simple hematoma or they could represent the actual injectate from the procedures. Usually, material in the epidural space will spread freely. For some reason, this material is remaining focal in nature. Therefore, it could possibly be subdural. Subdural material also often spreads out. Also, Hounsfield unit density is over 190 which is more dense than blood usually measures. Is it possible to learn exactly what was injected  in this patient?  IMPRESSION: Focal hyperdense collections at the T8 level, larger on the left than on the right, occupying a large portion of the spinal canal and compressing the spinal cord. The focal nature of the collections is unusual for either blood or usual injectate in the epidural or even in the subdural space. Also, the density is higher than blood normally measures. Therefore, this may be a focal collection of some material that was  injected rather than an actual hematoma. The could also be mixture of injectate and hematoma.   Electronically Signed   By: Nelson Chimes M.D.   On: 11/12/2013 15:38   Mr Cervical Spine Wo Contrast  11/12/2013   CLINICAL DATA:  Bilateral lower extremity weakness and loss of sensation.  EXAM: MRI CERVICAL AND thoracic SPINE WITHOUT CONTRAST  TECHNIQUE: Multiplanar and multiecho pulse sequences of the cervical spine, to include the craniocervical junction and cervicothoracic junction, and thoracic spine, were obtained without intravenous contrast.  COMPARISON:  Radiography 07/19/2013. MRI lumbar 11/11/2013. CT chest 09/19/2013.  FINDINGS: MRI CERVICAL SPINE FINDINGS  Alignment is normal. No disc pathology. The spinal canal is widely patent. Ample subarachnoid space surrounds the cord. No osseous or articular pathology. No abnormal cord signal. No evidence of myelitis.  MRI thoracic SPINE FINDINGS  There is no significant finding at T6 or above. The disc at T6-7 shows a shallow protrusion towards the right but no apparent neural compression. There is an old minor compression deformity of the T7 vertebral body anteriorly without retropulsion.  The T8 vertebral body shows 80% loss of height. No visible marrow edema. There is minimal posterior bowing of the posterior margin of the vertebral body, 2 mm at the most. There are intraspinal lesions at this level. The largest is on the left measuring approximately 12 x 8 x 17 mm within the left posterior lateral quadrant of the spinal canal. Within the right lateral side of the spinal canal at the T8-9 disc level, there is an approximately 7 mm in diameter entity. these show pronounced signal loss on T2 and STIR imaging. I cannot see evidence of any thing in this location on the CT scan of 09/19/2013, despite the fracture being present. The differential diagnosis includes intraspinal masses such as a meningioma, but I would have expected to see that on the previous CT. They  could represent extruded disc material or intraspinal hematoma. In any case, there is significant mass effect upon the spinal cord, likely explaining the patient's clinical presentation.  There is mild loss of height at the T9 vertebral body anteriorly without edema. There are minor disc bulges in the lower thoracic spine below that but no compressive pathology.  IMPRESSION: Negative evaluation of the cervical spine.  80% compression fracture at T8. This fracture does not appear changed when compared to the CT scan of 09/19/2013. Minor old compression fractures at T7 and T9. Minimal posterior bowing at T8, approximately 2 mm. Abnormal structures within the spinal canal at the T8 level, larger on the left than on the right, which appear to compress the spinal cord. I cannot see these on the previous chest CT. The differential diagnosis includes pre-existing masses such as meningioma. However, I would have expected to be able to see these on the previous chest CT. Therefore, they could represent intraspinal hematoma or extruded disc material. Urgent surgical consultation is suggested. If further imaging is desired, CT of this region may be of some use.  Critical Value/emergent results were called by telephone  at the time of interpretation on 11/12/2013 at 1:55 PM to Dr. Roland Rack , who verbally acknowledged these results.   Electronically Signed   By: Nelson Chimes M.D.   On: 11/12/2013 14:00   Mr Thoracic Spine Wo Contrast  11/12/2013   CLINICAL DATA:  Bilateral lower extremity weakness and loss of sensation.  EXAM: MRI CERVICAL AND thoracic SPINE WITHOUT CONTRAST  TECHNIQUE: Multiplanar and multiecho pulse sequences of the cervical spine, to include the craniocervical junction and cervicothoracic junction, and thoracic spine, were obtained without intravenous contrast.  COMPARISON:  Radiography 07/19/2013. MRI lumbar 11/11/2013. CT chest 09/19/2013.  FINDINGS: MRI CERVICAL SPINE FINDINGS  Alignment is  normal. No disc pathology. The spinal canal is widely patent. Ample subarachnoid space surrounds the cord. No osseous or articular pathology. No abnormal cord signal. No evidence of myelitis.  MRI thoracic SPINE FINDINGS  There is no significant finding at T6 or above. The disc at T6-7 shows a shallow protrusion towards the right but no apparent neural compression. There is an old minor compression deformity of the T7 vertebral body anteriorly without retropulsion.  The T8 vertebral body shows 80% loss of height. No visible marrow edema. There is minimal posterior bowing of the posterior margin of the vertebral body, 2 mm at the most. There are intraspinal lesions at this level. The largest is on the left measuring approximately 12 x 8 x 17 mm within the left posterior lateral quadrant of the spinal canal. Within the right lateral side of the spinal canal at the T8-9 disc level, there is an approximately 7 mm in diameter entity. these show pronounced signal loss on T2 and STIR imaging. I cannot see evidence of any thing in this location on the CT scan of 09/19/2013, despite the fracture being present. The differential diagnosis includes intraspinal masses such as a meningioma, but I would have expected to see that on the previous CT. They could represent extruded disc material or intraspinal hematoma. In any case, there is significant mass effect upon the spinal cord, likely explaining the patient's clinical presentation.  There is mild loss of height at the T9 vertebral body anteriorly without edema. There are minor disc bulges in the lower thoracic spine below that but no compressive pathology.  IMPRESSION: Negative evaluation of the cervical spine.  80% compression fracture at T8. This fracture does not appear changed when compared to the CT scan of 09/19/2013. Minor old compression fractures at T7 and T9. Minimal posterior bowing at T8, approximately 2 mm. Abnormal structures within the spinal canal at the T8  level, larger on the left than on the right, which appear to compress the spinal cord. I cannot see these on the previous chest CT. The differential diagnosis includes pre-existing masses such as meningioma. However, I would have expected to be able to see these on the previous chest CT. Therefore, they could represent intraspinal hematoma or extruded disc material. Urgent surgical consultation is suggested. If further imaging is desired, CT of this region may be of some use.  Critical Value/emergent results were called by telephone at the time of interpretation on 11/12/2013 at 1:55 PM to Dr. Roland Rack , who verbally acknowledged these results.   Electronically Signed   By: Nelson Chimes M.D.   On: 11/12/2013 14:00   Dg Chest Port 1 View  11/12/2013   CLINICAL DATA:  Endotracheal tube placement  EXAM: PORTABLE CHEST - 1 VIEW  COMPARISON:  DG THORACIC SPINE dated 11/12/2013; CT T SPINE W/O  CM dated 11/12/2013; DG RIBS W/ CHEST 3+V*L* dated 10/24/2013  FINDINGS: Endotracheal tube 3.4 cm from carinal. Normal cardiac silhouette. There is bibasilar atelectasis. Small effusions. No pneumothorax. NG tube extends to the stomach.  IMPRESSION: Endotracheal tube in good position.  Bibasilar atelectasis and small effusions.   Electronically Signed   By: Suzy Bouchard M.D.   On: 11/12/2013 22:31    Assessment/Plan: She is getting better from a neurological standpoint. Her strength is improving faster than I expected. We'll transfer to the floor today, PTOT, await pathology   LOS: 3 days    JONES,DAVID S 11/14/2013, 9:28 AM

## 2013-11-14 NOTE — Evaluation (Signed)
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 Pt's functional mobility is limited by pain and LE weakness. Pt presents with decreased proprioception of Bilat LEs and reports a numbness feeling in Bilat feet. Pt unable to accurately associate feet on the ground on this date. Pt req 2 person max A for sit to stand transfers with special attention to block Bilat knees from buckling. Pt is a good candidate for CIR to improve mobility for a safe return home. Pt to benefit from skilled acute PT to address deficits listed below.     PT Assessment  Patient needs continued PT services    Follow Up Recommendations  CIR;Supervision/Assistance - 24 hour    Does the patient have the potential to tolerate intense rehabilitation      Barriers to Discharge        Equipment Recommendations  Rolling walker with 5" wheels    Recommendations for Other Services Rehab consult   Frequency Min 5X/week    Precautions / Restrictions Precautions Precautions: Back;Fall Precaution Booklet Issued: No Precaution Comments: reinforce precaution with pt at next visit. supply back precaution booklet to pt at next visit Restrictions Weight Bearing Restrictions: No   Pertinent Vitals/Pain Pt reports back pain near surgical area      Mobility  Bed Mobility Overal bed mobility: Needs Assistance Bed Mobility: Rolling;Sidelying to Sit Rolling: Min assist Sidelying to sit: Mod assist General bed mobility comments: verbal cues for safety, hand placement and precautions Transfers Overall transfer level: Needs assistance Equipment used: 2 person hand held assist Transfers: Sit to/from Stand;Stand Pivot Transfers Sit to  Stand: +2 physical assistance;Max assist Stand pivot transfers: +2 physical assistance;Max assist General transfer comment: dec proprioception and awareness of foot placement on floor. verbal and tactile cues for step sequencing and posture correction. 2 person max A blocking Bilat knees due to buckling. L LE more involved than R Ambulation/Gait Ambulation/Gait assistance: +2 physical assistance;Max assist Ambulation Distance (Feet): 2 Feet Assistive device: 2 person hand held assist Gait Pattern/deviations: Step-to pattern;Decreased stride length;Shuffle;Steppage;Decreased weight shift to right;Decreased weight shift to left;Narrow base of support;Trunk flexed Gait velocity: slow General Gait Details: 2 side steps to the R and 2 steps forward for a bed to chair transfer. Pt req verbal and tactile cues for step sequencing. 2 person max A guarding Bilat LE for knee buckling    Exercises General Exercises - Lower Extremity Ankle Circles/Pumps: AROM;Strengthening;Both;10 reps;Supine;Seated Long [employer]: Strengthening;Both;10 reps;Seated   PT Diagnosis: Difficulty walking;Generalized weakness;Acute pain  PT Problem List: Decreased strength;Decreased range of motion;Decreased activity tolerance;Decreased balance;Decreased mobility;Decreased coordination;Decreased knowledge of use of DME;Decreased safety awareness;Impaired sensation;Pain;Decreased knowledge of precautions PT Treatment Interventions: DME instruction;Gait training;Stair training;Functional mobility training;Therapeutic activities;Therapeutic exercise;Balance training;Patient/family education     PT Goals(Current goals can be found in the care plan section) Acute Rehab PT Goals Patient Stated Goal: return home PT Goal Formulation: With patient Time For Goal Achievement: 11/21/13 Potential to Achieve Goals: Good  Visit Information  Last PT Received On: 11/14/13 Assistance Needed: +2 History of Present Illness: s/p T8  laminectomy with resection of epidural mass       Prior Functioning  Home Living Family/patient expects to be discharged to:: Private residence Living Arrangements: Spouse/significant other;Children Available  Help at Discharge: Family;Available 24 hours/day Type of Home: Mobile home Home Access: Stairs to enter Entrance Stairs-Number of Steps: 4 Entrance Stairs-Rails: Left Home Layout: One level Home Equipment: Cane - single point;Bedside commode;Shower seat;Walker - 2 wheels Prior Function Level of Independence: Independent with assistive device(s) Comments: 4L 02 at home Communication Communication: No difficulties    Cognition  Cognition Arousal/Alertness: Awake/alert Behavior During Therapy: WFL for tasks assessed/performed Overall Cognitive Status: Within Functional Limits for tasks assessed    Extremity/Trunk Assessment Upper Extremity Assessment Upper Extremity Assessment: Overall WFL for tasks assessed Lower Extremity Assessment Lower Extremity Assessment: RLE deficits/detail;LLE deficits/detail RLE Deficits / Details: weak; decrease proprioception; intact sensation RLE Sensation: decreased proprioception (reports numbness in feet) RLE Coordination: decreased gross motor LLE Deficits / Details: weak; decrease proprioception; intact sensation LLE Sensation: decreased proprioception (reports numbness in feet) LLE Coordination: decreased gross motor Cervical / Trunk Assessment Cervical / Trunk Assessment: Kyphotic   Balance Balance Overall balance assessment: Needs assistance Sitting-balance support: Bilateral upper extremity supported Sitting balance-Leahy Scale: Poor Sitting balance - Comments: sat EOB for 10 min req UE support.  Standing balance support: Bilateral upper extremity supported Standing balance-Leahy Scale: Zero Standing balance comment: 2 person max A. pt stood for 1 min, 3 separate times.   End of Session PT - End of Session Equipment Utilized  During Treatment: Gait belt;Oxygen (4L of 02) Activity Tolerance: Patient limited by fatigue;Patient limited by pain Patient left: in chair;with call bell/phone within reach Nurse Communication: Mobility status  GP     Kohler,Andrew SPT 11/14/2013, 1:13 PM  Agree with above assessment.  Kittie Plater, PT, DPT Pager #: [phone] Office #: [phone]

## 2013-11-14 NOTE — Progress Notes (Signed)
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  80% compression fracture at T8. This fracture does not appear changed when compared to the CT scan of 09/19/2013. Minor old compression fractures at T7 and T9. Minimal posterior bowing at T8, approximately 2 mm. Abnormal structures within the spinal canal at the T8 level, larger on the left than on the right, which appear to compress the spinal cord. I cannot see these on the previous chest CT. The differential diagnosis includes pre-existing masses such as meningioma. However, I would have expected to be able to see these on the previous chest CT. Therefore, they could represent intraspinal hematoma or extruded disc material.   CT chest without contrast 3/21 Focal hyperdense collections at the T8 level, larger on the left than on the right, occupying a large portion of the spinal canal and compressing the spinal cord. The focal nature of the collections is unusual for either blood or usual injectate  in the epidural or even in the subdural space. Also, the density is higher than blood  normally measures.    3/22 duplex LLE neg  LINES / TUBES: ET tube 3/21 >>3/22  CULTURES: Blood 3/21 >>  urine 3/21>>  ANTIBIOTICS: None  SUBJECTIVE: denies pain, Bp  better Breathing 'ok'  VITAL SIGNS: Temp:  [97.9 F (36.6 C)-98.7 F (37.1 C)] 97.9 F (36.6 C) (03/23 0337) Pulse Rate:  [77-112] 79 (03/23 0800) Resp:  [10-23] 11 (03/23 0800) BP: (79-127)/(48-88) 109/67 mmHg (03/23 0945) SpO2:  [90 %-97 %] 95 % (03/23 0800) HEMODYNAMICS:   VENTILATOR SETTINGS:   INTAKE / OUTPUT: Intake/Output     03/22 0701 - 03/23 0700 03/23 0701 - 03/24 0700   I.V. (mL/kg) 1800 (25.5) 75 (1.1)   IV Piggyback     Total Intake(mL/kg) 1800 (25.5) 75 (1.1)   Urine (mL/kg/hr) 1205 (0.7) 150 (0.6)   Emesis/NG output     Blood     Total Output 1205 150   Net +595 -75         PHYSICAL EXAMINATION: General:  NAD, afebrile, obese female, chronically ill appearing. Neuro:  Patient able to move bilateral UE, moving BL feet 3/5 HEENT:  PERRL, no sclearal icterus, no scleral edema, oral mucosa pink and moist Cardiovascular:  RRR, no MRG, normal S1S2 Lungs:  Patient with bilateral rhonchi , comfortable on PS 5/5 Abdomen:  NT ND normal bowel sounds, no organomegaly Musculoskeletal:  Left lower extremity swelling Skin:  Patient without petechie, janeway lesions, or obvious skin break down.  Dressing dry and clean as per nurse (not turning patient  LABS:  Recent Labs Lab 11/12/13 0620 11/12/13 2137 11/13/13 0252 11/13/13 0344 11/14/13 0225  HGB 13.9  --  12.2  --  11.0*  WBC 10.2  --  7.3  --  5.5  PLT 207  --  168  --  165  NA 144  --  144  --  145  K 3.8  --  4.4  --  4.5  CL 98  --  103  --  108  CO2 34*  --  30  --  27  GLUCOSE 87  --  113*  --  140*  BUN 33*  --  26*  --  30*  CREATININE 1.25*  --  0.99  --  0.93  CALCIUM 8.6  --  7.7*  --  8.0*  PHART  --  7.414  --  7.445  --   PCO2ART  --  52.8*  --  43.5  --   PO2ART  --  82.7  --  87.2  --    Recent Labs Lab 11/13/13 0821 11/13/13 1125 11/13/13 1554 11/13/13 1955 11/13/13 2353  GLUCAP 122* 127* 106* 157* 139*   CXR: Pending  ASSESSMENT / PLAN:  PULMONARY A: Mechanical  Ventilation s/p T8 laminectomy for perispinal mass with associated weakness Severe COPD with recent flu / HCAP 08/2013 P:   Extubated and doing well. Titrate O2 for sats. Resume home Advair and Spiriva today. Change duonebs to PRN albuterol  CARDIOVASCULAR A: CHF P:  Continue current care.  RENAL A:  CKD P:   Replace electrolytes as indicated. BMET in AM.  GASTROINTESTINAL A:  PPI prophylaxis  P:   Diet as ordered.  HEMATOLOGIC A:  Left leg swelling with history of weakness (mild swelling) Ultrasound neg DVT P:  No SCD's on left leg Foot pumps  Resume heparin once cleared by neurosurgery   INFECTIOUS A:  T8 mass P:   Will need to be addressed once acute event is managed, will defer to [facility] when picked back up in AM.  A:  HSV suppressive therapy P: Continue current care.  ENDOCRINE A:  Hypothyroidism   P:   Management as per primary team  NEUROLOGIC A:  Weakness S/p removal of T8 spinal mass Pending pathology  P:   Defer to neuro surgery  Chronic pain and anxiety P: Continue home meds. Watch for withdrawal.  Transfer to SDU, restart home bronchodilators, pain and anxiety control as above, transfer to med-surg and back to [facility] service.  PCCM will sign off, please call back if needed.  I have personally obtained a history, examined the patient, evaluated laboratory and imaging results, formulated the assessment and plan and placed orders.  Rush Farmer, M.D. [facility] Pulmonary/Critical Care Medicine. Pager: [phone]. After hours pager: [phone].

## 2013-11-14 NOTE — Evaluation (Signed)
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 Pt's functional mobility is limited by pain and LE weakness. Pt presents with decreased proprioception of Bilat LEs and reports a numbness feeling in Bilat feet. Pt unable to accurately associate feet on the ground on this date. Pt req 2 person max A for sit to stand transfers with special attention to block Bilat knees from buckling. Pt is a good candidate for CIR to improve mobility for a safe return home. Pt to benefit from skilled acute PT to address deficits listed below.     PT Assessment  Patient needs continued PT services    Follow Up Recommendations  CIR;Supervision/Assistance - 24 hour    Does the patient have the potential to tolerate intense rehabilitation      Barriers to Discharge        Equipment Recommendations  Rolling walker with 5" wheels    Recommendations for Other Services Rehab consult   Frequency Min 5X/week    Precautions / Restrictions Precautions Precautions: Back;Fall Precaution Booklet Issued: No Precaution Comments: reinforce precaution with pt at next visit. supply back precaution booklet to pt at next visit Restrictions Weight Bearing Restrictions: No   Pertinent Vitals/Pain Pt reports back pain near surgical area      Mobility  Bed Mobility Overal bed mobility: Needs Assistance Bed Mobility: Rolling;Sidelying to Sit Rolling: Min assist Sidelying to sit: Mod assist General bed mobility comments: verbal cues for safety, hand placement and precautions Transfers Overall transfer level: Needs assistance Equipment used: 2 person hand held assist Transfers: Sit to/from Stand;Stand Pivot Transfers Sit to  Stand: +2 physical assistance;Max assist Stand pivot transfers: +2 physical assistance;Max assist General transfer comment: dec proprioception and awareness of foot placement on floor. verbal and tactile cues for step sequencing and posture correction. 2 person max A blocking Bilat knees due to buckling. L LE more involved than R Ambulation/Gait Ambulation/Gait assistance: +2 physical assistance;Max assist Ambulation Distance (Feet): 2 Feet Assistive device: 2 person hand held assist Gait Pattern/deviations: Step-to pattern;Decreased stride length;Shuffle;Steppage;Decreased weight shift to right;Decreased weight shift to left;Narrow base of support;Trunk flexed Gait velocity: slow General Gait Details: 2 side steps to the R and 2 steps forward for a bed to chair transfer. Pt req verbal and tactile cues for step sequencing. 2 person max A guarding Bilat LE for knee buckling    Exercises General Exercises - Lower Extremity Ankle Circles/Pumps: AROM;Strengthening;Both;10 reps;Supine;Seated Long [employer]: Strengthening;Both;10 reps;Seated   PT Diagnosis: Difficulty walking;Generalized weakness;Acute pain  PT Problem List: Decreased strength;Decreased range of motion;Decreased activity tolerance;Decreased balance;Decreased mobility;Decreased coordination;Decreased knowledge of use of DME;Decreased safety awareness;Impaired sensation;Pain;Decreased knowledge of precautions PT Treatment Interventions: DME instruction;Gait training;Stair training;Functional mobility training;Therapeutic activities;Therapeutic exercise;Balance training;Patient/family education     PT Goals(Current goals can be found in the care plan section) Acute Rehab PT Goals Patient Stated Goal: return home PT Goal Formulation: With patient Time For Goal Achievement: 11/21/13 Potential to Achieve Goals: Good  Visit Information  Last PT Received On: 11/14/13 Assistance Needed: +2 History of Present Illness: s/p T8  laminectomy with resection of epidural mass       Prior Functioning  Home Living Family/patient expects to be discharged to:: Private residence Living Arrangements: Spouse/significant other;Children Available  Help at Discharge: Family;Available 24 hours/day Type of Home: Mobile home Home Access: Stairs to enter Entrance Stairs-Number of Steps: 4 Entrance Stairs-Rails: Left Home Layout: One level Home Equipment: Cane - single point;Bedside commode;Shower seat;Walker - 2 wheels Prior Function Level of Independence: Independent with assistive device(s) Comments: 4L 02 at home Communication Communication: No difficulties    Cognition  Cognition Arousal/Alertness: Awake/alert Behavior During Therapy: WFL for tasks assessed/performed Overall Cognitive Status: Within Functional Limits for tasks assessed    Extremity/Trunk Assessment Upper Extremity Assessment Upper Extremity Assessment: Overall WFL for tasks assessed Lower Extremity Assessment Lower Extremity Assessment: RLE deficits/detail;LLE deficits/detail RLE Deficits / Details: weak; decrease proprioception; intact sensation RLE Sensation: decreased proprioception (reports numbness in feet) RLE Coordination: decreased gross motor LLE Deficits / Details: weak; decrease proprioception; intact sensation LLE Sensation: decreased proprioception (reports numbness in feet) LLE Coordination: decreased gross motor Cervical / Trunk Assessment Cervical / Trunk Assessment: Kyphotic   Balance Balance Overall balance assessment: Needs assistance Sitting-balance support: Bilateral upper extremity supported Sitting balance-Leahy Scale: Poor Sitting balance - Comments: sat EOB for 10 min req UE support.  Standing balance support: Bilateral upper extremity supported Standing balance-Leahy Scale: Zero Standing balance comment: 2 person max A. pt stood for 1 min, 3 separate times.   End of Session PT - End of Session Equipment Utilized  During Treatment: Gait belt;Oxygen (4L of 02) Activity Tolerance: Patient limited by fatigue;Patient limited by pain Patient left: in chair;with call bell/phone within reach Nurse Communication: Mobility status  GP     Ayane Delancey SPT 11/14/2013, 1:13 PM

## 2013-11-15 ENCOUNTER — Encounter (HOSPITAL_COMMUNITY): Payer: Self-pay | Admitting: Neurological Surgery

## 2013-11-15 DIAGNOSIS — C720 Malignant neoplasm of spinal cord: Secondary | ICD-10-CM

## 2013-11-15 DIAGNOSIS — G8220 Paraplegia, unspecified: Secondary | ICD-10-CM

## 2013-11-15 LAB — CBC
HEMATOCRIT: 38.2 % (ref 36.0–46.0)
Hemoglobin: 12.5 g/dL (ref 12.0–15.0)
MCH: 33.6 pg (ref 26.0–34.0)
MCHC: 32.7 g/dL (ref 30.0–36.0)
MCV: 102.7 fL — AB (ref 78.0–100.0)
PLATELETS: 195 10*3/uL (ref 150–400)
RBC: 3.72 MIL/uL — AB (ref 3.87–5.11)
RDW: 16.1 % — ABNORMAL HIGH (ref 11.5–15.5)
WBC: 7.3 10*3/uL (ref 4.0–10.5)

## 2013-11-15 LAB — BASIC METABOLIC PANEL
BUN: 35 mg/dL — ABNORMAL HIGH (ref 6–23)
CALCIUM: 9.1 mg/dL (ref 8.4–10.5)
CO2: 25 mEq/L (ref 19–32)
Chloride: 105 mEq/L (ref 96–112)
Creatinine, Ser: 0.9 mg/dL (ref 0.50–1.10)
GFR calc Af Amer: 84 mL/min — ABNORMAL LOW (ref >90)
GFR calc non Af Amer: 73 mL/min — ABNORMAL LOW (ref >90)
GLUCOSE: 122 mg/dL — AB (ref 70–99)
Potassium: 5.1 mEq/L (ref 3.7–5.3)
Sodium: 143 mEq/L (ref 137–147)

## 2013-11-15 LAB — MAGNESIUM: Magnesium: 2.5 mg/dL (ref 1.5–2.5)

## 2013-11-15 LAB — PHOSPHORUS: Phosphorus: 3.6 mg/dL (ref 2.3–4.6)

## 2013-11-15 NOTE — Progress Notes (Signed)
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  Patient with productive cough and green sputum, nsg made aware.  Follow Up Recommendations  CIR;Supervision/Assistance - 24 hour     Equipment Recommendations  Rolling walker with 5" wheels    Recommendations for Other Services Rehab consult     Precautions / Restrictions Precautions Precautions: Back;Fall Precaution Booklet Issued: No Precaution Comments: reinforce precaution with pt at next visit. supply back precaution booklet to pt at next visit Restrictions Weight Bearing Restrictions: No    Mobility  Bed Mobility Overal bed mobility: Needs Assistance Bed Mobility: Rolling;Sidelying to Sit Rolling: Min assist Sidelying to sit: Mod assist       General bed mobility comments: verbal cues for safety, hand placement and precautions  Transfers Overall transfer level: Needs assistance Equipment used: 2 person hand held assist Transfers: Sit to/from Stand;Stand Pivot Transfers Sit to Stand: +2 physical assistance;Max assist Stand pivot transfers: +2 physical assistance;Max assist       General transfer comment: dec proprioception and awareness of foot placement on floor. verbal and tactile cues for step sequencing and posture correction. 2 person max A blocking Bilat knees due to buckling. L LE more involved than R  Ambulation/Gait             General Gait Details: pre gait activities performed   Stairs            Wheelchair Mobility    Modified Rankin (Stroke Patients Only)       Balance     Sitting balance-Leahy Scale: Poor Sitting balance - Comments: sat EOB for 10 min req UE support.      Standing balance-Leahy Scale: Zero                       Cognition Arousal/Alertness: Awake/alert Behavior During Therapy: WFL for tasks assessed/performed Overall Cognitive Status: Within Functional Limits for tasks assessed                      Exercises General Exercises - Lower Extremity Ankle Circles/Pumps: AROM;Strengthening;Both;10 reps;Supine;Seated    General Comments General comments (skin integrity, edema, etc.): educated on pursed lip breathing and performed therapeutically sitting EOB as patient with productive couching spell.  Pre-gait activities performed inculding standing x3 with attempts to lateral weigh shift.  Patient fatigues very quickly today, assisted to chair.      Pertinent Vitals/Pain 5/10 pain    Home Living Family/patient expects to be discharged to:: Private residence Living Arrangements: Spouse/significant other;Children Available Help at Discharge: Family;Available 24 hours/day Type of Home: Mobile home Home Access: Stairs to enter Entrance Stairs-Rails: Left Home Layout: One level Home Equipment: Cane - single point;Bedside commode;Shower seat;Walker - 2 wheels      Prior Function Level of Independence: Independent with assistive device(s)      Comments: 4L 02 at home   PT Goals (current goals can now be found in the care plan section) Acute Rehab PT Goals Patient Stated Goal: return home PT Goal Formulation: With patient Time For Goal Achievement: 11/21/13 Potential to Achieve Goals: Good Progress towards PT goals: Progressing toward goals    Frequency  Min 5X/week    PT Plan Current plan remains appropriate  End of Session Equipment Utilized During Treatment: Gait belt;Oxygen (4L of 02) Activity Tolerance: Patient limited by fatigue;Patient limited by pain Patient left: in chair;with call bell/phone within reach     Time: 2841-3244 PT Time Calculation (min): 19 min  Charges:  $Therapeutic Activity: 8-22 mins                    G CodesDuncan Dull 11-27-2013, 1:05 PM Alben Deeds, [place] DPT  [phone]

## 2013-11-15 NOTE — Progress Notes (Signed)
Patient ID: [name], female   DOB: [birth year], 53 y.o.   MRN: [id number] Subjective: Patient reports Appropriate back soreness. Denies leg pain. Feels strength is similar to yesterday.  Objective: Vital signs in last 24 hours: Temp:  [97.1 F (36.2 C)-98.5 F (36.9 C)] 97.5 F (36.4 C) (03/24 0550) Pulse Rate:  [73-132] 79 (03/24 0550) Resp:  [11-18] 18 (03/24 0550) BP: (91-123)/(54-75) 111/72 mmHg (03/24 0550) SpO2:  [89 %-96 %] 93 % (03/24 0550) Weight:  [73.755 kg (162 lb 9.6 oz)] 73.755 kg (162 lb 9.6 oz) (03/23 2243)  Intake/Output from previous day: 03/23 0701 - 03/24 0700 In: 1565 [P.O.:1490; I.V.:75] Out: 1300 [Urine:1300] Intake/Output this shift:    strength similar to yesterday with RLE DF 3/5, PF 3/5, KE 4-/5, hF 2/5 and LLE DF 2/5, PF3/5, HF1/5, KE3/5  Lab Results: Lab Results  Component Value Date   WBC 5.5 11/14/2013   HGB 11.0* 11/14/2013   HCT 33.3* 11/14/2013   MCV 102.1* 11/14/2013   PLT 165 11/14/2013   Lab Results  Component Value Date   INR 0.90 10/18/2013   BMET Lab Results  Component Value Date   NA 145 11/14/2013   K 4.5 11/14/2013   CL 108 11/14/2013   CO2 27 11/14/2013   GLUCOSE 140* 11/14/2013   BUN 30* 11/14/2013   CREATININE 0.93 11/14/2013   CALCIUM 8.0* 11/14/2013    Studies/Results: No results found.  Assessment/Plan: Doing well. Expect slow strength gains after initial improvement that was more than I expected pre-op. Rehab consult. Path pending. Doe she need a metastatic w/u? Doubt met based on location and no bony involvement.   LOS: 4 days    Linwood Gullikson S 11/15/2013, 7:59 AM

## 2013-11-15 NOTE — Progress Notes (Signed)
[facility] PROGRESS NOTE  [name] [id number] DOB: [birth year] DOA: 11/11/2013 PCP: [name], NP  Interim history 53 year old female with COPD (4L [place]), diastolic CHF, fibromyalgia, peripheral neuropathy, presented with left leg weakness worsening over 3 days. The patient had 2 falls due to leg weakness. Patient also had urine retention for which a foley catheter was placed.  She also reported increasing low back pain after fall. MRI of the thoracic spine on 11/12/2013 showed 80% compression fracture of T8 with an approximately 7 mm in diameter entity. These show pronounced signal loss on T2 and STIR imaging. There was concern of a meningioma versus intraspinal hematoma causing mass effect upon the spinal cord. Neurosurgery was consulted. On 11/12/2013, Dr. Sherley Bounds performed a T8 laminectomy for resection of epidural mass utilizing microscopic dissection. Pathology shows fibroadipose tissue with degenerative cartilaginous tissue with abundant necrosis. There was no malignancy. There was no hematoma noted on the operative note. The patient remained ventilated, and she was subsequently extubated on 11/13/2013. She was transitioned to the medical floor on 11/14/2013. Blood cultures remained negative. The patient remains afebrile and hemodynamically stable.   Assessment/Plan: T8 thoracic mass with myelopathy -The mass caused left leg weakness resulting from spinal cord impingement -11/12/2013--T8 laminectomy with resection of epidural mass -appreciate neurosurgery -leg strength improving post resection -Dexamethasone dosing per neurosurgery Acute on chronic respiratory failure -Extubated on 11/13/2013 -CCM signed off 11/14/13 -baseline 4L [place] at home -Currently remains stable on 4 L nasal cannula -goal SpO2 >92% COPD -Patient is already on dexamethasone for spinal cord compression -Mild wheezing at the bases, but no respiratory distress -Continue 4 L nasal cannula -Continue  albuterol when necessary -Continue Dulera -Continue Spiriva Chronic diastolic CHF -Heart catheterization 10/18/2013--no objective lesions -Stable Left leg edema -Venous duplex negative for DVT Continued tobacco use -Tobacco cessation discussed -Continue Nicoderm patch- Fibromyalgia -Continue Cymbalta, trazodone at bedtime, Lyrica  constipation/abdominal pain -abdominal xray  -Start cathartics   Family Communication:   Pt at beside Disposition Plan:   CIR       Procedures/Studies: Dg Thoracic Spine 2 View  11/12/2013   CLINICAL DATA:  Localizer for thoracic spinal surgery.  EXAM: THORACIC SPINE - 2 VIEW  COMPARISON:  11/12/2013 CT and MR  FINDINGS: Two intraoperative lateral views of the thoracic spine are submitted postoperatively for interpretation.  Film #1 demonstrates a posterior metallic probe with tip at the the T9-T10 level.  Film #2 demonstrates a posterior metallic probe with tip at the T7-T8 level.  IMPRESSION: Publishing copy Signed   By: Hassan Rowan M.D.   On: 11/12/2013 18:54   Ct Thoracic Spine Wo Contrast  11/12/2013   CLINICAL DATA:  Loss of strength and feeling in the lower extremities. Abnormal MRI. Additional history now available is that the patient had recent thoracic spinal injections in [facility].  EXAM: CT THORACIC SPINE WITHOUT CONTRAST  TECHNIQUE: Multidetector CT imaging of the thoracic spine was performed without intravenous contrast administration. Multiplanar CT image reconstructions were also generated.  COMPARISON:  MRI same day  FINDINGS: The CT scan again shows minor compression deformities at T7 and T9 an a more advanced compression fracture at T8. At T8 there is loss of height of 80%. Retropulsion of 2-3 mm at this level. There are well-circumscribed hyperdense collections within the spinal canal both on the left and on the right at this level. On the left, this measures 14 x 9 x 14 mm. On the right, this measures approximately 6 x  5 x 5  mm. The collections are quite well-circumscribed. They could represent simple hematoma or they could represent the actual injectate from the procedures. Usually, material in the epidural space will spread freely. For some reason, this material is remaining focal in nature. Therefore, it could possibly be subdural. Subdural material also often spreads out. Also, Hounsfield unit density is over 190 which is more dense than blood usually measures. Is it possible to learn exactly what was injected in this patient?  IMPRESSION: Focal hyperdense collections at the T8 level, larger on the left than on the right, occupying a large portion of the spinal canal and compressing the spinal cord. The focal nature of the collections is unusual for either blood or usual injectate in the epidural or even in the subdural space. Also, the density is higher than blood normally measures. Therefore, this may be a focal collection of some material that was injected rather than an actual hematoma. The could also be mixture of injectate and hematoma.   Electronically Signed   By: Nelson Chimes M.D.   On: 11/12/2013 15:38   Mr Cervical Spine Wo Contrast  11/12/2013   CLINICAL DATA:  Bilateral lower extremity weakness and loss of sensation.  EXAM: MRI CERVICAL AND thoracic SPINE WITHOUT CONTRAST  TECHNIQUE: Multiplanar and multiecho pulse sequences of the cervical spine, to include the craniocervical junction and cervicothoracic junction, and thoracic spine, were obtained without intravenous contrast.  COMPARISON:  Radiography 07/19/2013. MRI lumbar 11/11/2013. CT chest 09/19/2013.  FINDINGS: MRI CERVICAL SPINE FINDINGS  Alignment is normal. No disc pathology. The spinal canal is widely patent. Ample subarachnoid space surrounds the cord. No osseous or articular pathology. No abnormal cord signal. No evidence of myelitis.  MRI thoracic SPINE FINDINGS  There is no significant finding at T6 or above. The disc at T6-7 shows a shallow  protrusion towards the right but no apparent neural compression. There is an old minor compression deformity of the T7 vertebral body anteriorly without retropulsion.  The T8 vertebral body shows 80% loss of height. No visible marrow edema. There is minimal posterior bowing of the posterior margin of the vertebral body, 2 mm at the most. There are intraspinal lesions at this level. The largest is on the left measuring approximately 12 x 8 x 17 mm within the left posterior lateral quadrant of the spinal canal. Within the right lateral side of the spinal canal at the T8-9 disc level, there is an approximately 7 mm in diameter entity. these show pronounced signal loss on T2 and STIR imaging. I cannot see evidence of any thing in this location on the CT scan of 09/19/2013, despite the fracture being present. The differential diagnosis includes intraspinal masses such as a meningioma, but I would have expected to see that on the previous CT. They could represent extruded disc material or intraspinal hematoma. In any case, there is significant mass effect upon the spinal cord, likely explaining the patient's clinical presentation.  There is mild loss of height at the T9 vertebral body anteriorly without edema. There are minor disc bulges in the lower thoracic spine below that but no compressive pathology.  IMPRESSION: Negative evaluation of the cervical spine.  80% compression fracture at T8. This fracture does not appear changed when compared to the CT scan of 09/19/2013. Minor old compression fractures at T7 and T9. Minimal posterior bowing at T8, approximately 2 mm. Abnormal structures within the spinal canal at the T8 level, larger on the left than on the  right, which appear to compress the spinal cord. I cannot see these on the previous chest CT. The differential diagnosis includes pre-existing masses such as meningioma. However, I would have expected to be able to see these on the previous chest CT. Therefore, they  could represent intraspinal hematoma or extruded disc material. Urgent surgical consultation is suggested. If further imaging is desired, CT of this region may be of some use.  Critical Value/emergent results were called by telephone at the time of interpretation on 11/12/2013 at 1:55 PM to Dr. Roland Rack , who verbally acknowledged these results.   Electronically Signed   By: Nelson Chimes M.D.   On: 11/12/2013 14:00   Mr Thoracic Spine Wo Contrast  11/12/2013   CLINICAL DATA:  Bilateral lower extremity weakness and loss of sensation.  EXAM: MRI CERVICAL AND thoracic SPINE WITHOUT CONTRAST  TECHNIQUE: Multiplanar and multiecho pulse sequences of the cervical spine, to include the craniocervical junction and cervicothoracic junction, and thoracic spine, were obtained without intravenous contrast.  COMPARISON:  Radiography 07/19/2013. MRI lumbar 11/11/2013. CT chest 09/19/2013.  FINDINGS: MRI CERVICAL SPINE FINDINGS  Alignment is normal. No disc pathology. The spinal canal is widely patent. Ample subarachnoid space surrounds the cord. No osseous or articular pathology. No abnormal cord signal. No evidence of myelitis.  MRI thoracic SPINE FINDINGS  There is no significant finding at T6 or above. The disc at T6-7 shows a shallow protrusion towards the right but no apparent neural compression. There is an old minor compression deformity of the T7 vertebral body anteriorly without retropulsion.  The T8 vertebral body shows 80% loss of height. No visible marrow edema. There is minimal posterior bowing of the posterior margin of the vertebral body, 2 mm at the most. There are intraspinal lesions at this level. The largest is on the left measuring approximately 12 x 8 x 17 mm within the left posterior lateral quadrant of the spinal canal. Within the right lateral side of the spinal canal at the T8-9 disc level, there is an approximately 7 mm in diameter entity. these show pronounced signal loss on T2 and STIR  imaging. I cannot see evidence of any thing in this location on the CT scan of 09/19/2013, despite the fracture being present. The differential diagnosis includes intraspinal masses such as a meningioma, but I would have expected to see that on the previous CT. They could represent extruded disc material or intraspinal hematoma. In any case, there is significant mass effect upon the spinal cord, likely explaining the patient's clinical presentation.  There is mild loss of height at the T9 vertebral body anteriorly without edema. There are minor disc bulges in the lower thoracic spine below that but no compressive pathology.  IMPRESSION: Negative evaluation of the cervical spine.  80% compression fracture at T8. This fracture does not appear changed when compared to the CT scan of 09/19/2013. Minor old compression fractures at T7 and T9. Minimal posterior bowing at T8, approximately 2 mm. Abnormal structures within the spinal canal at the T8 level, larger on the left than on the right, which appear to compress the spinal cord. I cannot see these on the previous chest CT. The differential diagnosis includes pre-existing masses such as meningioma. However, I would have expected to be able to see these on the previous chest CT. Therefore, they could represent intraspinal hematoma or extruded disc material. Urgent surgical consultation is suggested. If further imaging is desired, CT of this region may be of some use.  Critical Value/emergent results were called by telephone at the time of interpretation on 11/12/2013 at 1:55 PM to Dr. Roland Rack , who verbally acknowledged these results.   Electronically Signed   By: Nelson Chimes M.D.   On: 11/12/2013 14:00   Mr Lumbar Spine Wo Contrast  11/11/2013   CLINICAL DATA:  Low back pain, left leg weakness.  EXAM: MRI LUMBAR SPINE WITHOUT CONTRAST  TECHNIQUE: Multiplanar, multisequence MR imaging was performed. No intravenous contrast was administered.  COMPARISON:   Prior MRI from 11/09/2013.  FINDINGS: For the purposes of this dictation, the lowest well-formed vertebral body is presumed to be the L5-S1 level, and there presumed to be 5 lumbar type vertebral bodies.  Vertebral bodies are normally aligned with preservation of the normal lumbar lordosis. Vertebral body height is stable. Signal intensity within the vertebral body bone marrow is within normal limits. No acute osseous abnormality. Signal intensity within the visualized spinal cord is normal. The conus medullaris terminates at the L1 level.  At T10-11, seen only on sagittal projection, there is left-sided facet arthrosis with minimal disc bulge. No canal stenosis. There is question of mild left foraminal stenosis.  At T11-12, there is mild diffuse disc bulge without associated stenosis.  At T12-L1, small right paracentral disc protrusion again noted, unchanged. There is an associated annular fissure. Mild right foraminal stenosis also stable.  At L1-2, no disc bulge or disc protrusion. The intervertebral disc is well hydrated. No facet arthrosis. No canal or neural foraminal stenosis.  At L2-3, there is mild diffuse disc bulge with disc desiccation. No significant canal or neural foraminal stenosis. No facet arthrosis.  At L3-4, no disc bulge or disc protrusion. No significant facet arthrosis. No canal or neural foraminal stenosis.  L4-5, no disc bulge or disc protrusion. The intervertebral discs well hydrated. No significant facet arthrosis. No canal or neural foraminal stenosis.  L5-S1, no disc bulge or disc protrusion identified. Mild epidural lipomatosis noted. No significant facet arthrosis. No canal or neural foraminal stenosis.  Bilateral renal cystic disease again noted, right greater than left. Visualized visceral structures are otherwise unremarkable.  IMPRESSION: 1. Stable appearance of the lumbar spine with no significant degenerative changes identified. Epidural at the level of L5 and the sacrum again  noted. 2. Stable small right paracentral disc protrusion at T12-L1 with resultant mild right neural foraminal stenosis. 3. Left-sided facet arthrosis at T10-11 with resultant mild left foraminal narrowing.   Electronically Signed   By: Jeannine Boga M.D.   On: 11/11/2013 23:39   Dg Chest Port 1 View  11/12/2013   CLINICAL DATA:  Endotracheal tube placement  EXAM: PORTABLE CHEST - 1 VIEW  COMPARISON:  DG THORACIC SPINE dated 11/12/2013; CT T SPINE W/O CM dated 11/12/2013; DG RIBS W/ CHEST 3+V*L* dated 10/24/2013  FINDINGS: Endotracheal tube 3.4 cm from carinal. Normal cardiac silhouette. There is bibasilar atelectasis. Small effusions. No pneumothorax. NG tube extends to the stomach.  IMPRESSION: Endotracheal tube in good position.  Bibasilar atelectasis and small effusions.   Electronically Signed   By: Suzy Bouchard M.D.   On: 11/12/2013 22:31         Subjective: Patient complains of constipation. She denies any fevers, chills, chest pain, nausea, vomiting, diarrhea, headache, visual disturbance patient feels that her legs are getting stronger. She has some shortness of breath but states that this is slowly improving.   Objective: Filed Vitals:   11/15/13 0550 11/15/13 0808 11/15/13 1013 11/15/13 1326  BP: 111/72  116/72 108/71  Pulse: 79  87 80  Temp: 97.5 F (36.4 C)  97.8 F (36.6 C) 97.5 F (36.4 C)  TempSrc: Oral  Oral Oral  Resp: 18  18 20   Height:      Weight:      SpO2: 93% 92% 93% 97%    Intake/Output Summary (Last 24 hours) at 11/15/13 1718 Last data filed at 11/15/13 1326  Gross per 24 hour  Intake    730 ml  Output    725 ml  Net      5 ml   Weight change:  Exam:   General:  Pt is alert, follows commands appropriately, not in acute distress  HEENT: No icterus, No thrush,  [place]/AT  Cardiovascular: RRR, S1/S2, no rubs, no gallops  Respiratory: mild bibasilar wheezing. No rhonchi. Good air movement.  Abdomen: Soft/+BS, non tender, non distended, no  guarding  Extremities: No edema, No lymphangitis, No petechiae, No rashes, no synovitis  Data Reviewed: Basic Metabolic Panel:  Recent Labs Lab 11/11/13 2029 11/12/13 0620 11/13/13 0252 11/14/13 0225 11/15/13 0735  NA 139 144 144 145 143  K 4.0 3.8 4.4 4.5 5.1  CL 93* 98 103 108 105  CO2  --  34* 30 27 25   GLUCOSE 79 87 113* 140* 122*  BUN 39* 33* 26* 30* 35*  CREATININE 1.80* 1.25* 0.99 0.93 0.90  CALCIUM  --  8.6 7.7* 8.0* 9.1  MG  --   --   --   --  2.5  PHOS  --   --   --   --  3.6   Liver Function Tests: No results found for this basename: AST, ALT, ALKPHOS, BILITOT, PROT, ALBUMIN,  in the last 168 hours No results found for this basename: LIPASE, AMYLASE,  in the last 168 hours No results found for this basename: AMMONIA,  in the last 168 hours CBC:  Recent Labs Lab 11/11/13 2009 11/11/13 2029 11/12/13 0620 11/13/13 0252 11/14/13 0225 11/15/13 0735  WBC 11.7*  --  10.2 7.3 5.5 7.3  NEUTROABS 8.9*  --   --   --   --   --   HGB 14.6 14.6 13.9 12.2 11.0* 12.5  HCT 43.0 43.0 42.9 37.2 33.3* 38.2  MCV 101.9*  --  103.1* 101.6* 102.1* 102.7*  PLT 207  --  207 168 165 195   Cardiac Enzymes: No results found for this basename: CKTOTAL, CKMB, CKMBINDEX, TROPONINI,  in the last 168 hours BNP: No components found with this basename: POCBNP,  CBG:  Recent Labs Lab 11/13/13 0821 11/13/13 1125 11/13/13 1554 11/13/13 1955 11/13/13 2353  GLUCAP 122* 127* 106* 157* 139*    Recent Results (from the past 240 hour(s))  URINE CULTURE     Status: None   Collection Time    11/12/13  9:51 AM      Result Value Ref Range Status   Specimen Description URINE, CATHETERIZED   Final   Special Requests NONE   Final   Culture  Setup Time     Final   Value: 11/12/2013 17:23     Performed at [facility]     Final   Value: NO GROWTH     Performed at [facility]   Culture     Final   Value: NO GROWTH     Performed at [facility]    Report Status 11/13/2013 FINAL   Final  MRSA PCR SCREENING  Status: None   Collection Time    11/12/13  6:40 PM      Result Value Ref Range Status   MRSA by PCR NEGATIVE  NEGATIVE Final   Comment:            The GeneXpert MRSA Assay (FDA     approved for NASAL specimens     only), is one component of a     comprehensive MRSA colonization     surveillance program. It is not     intended to diagnose MRSA     infection nor to guide or     monitor treatment for     MRSA infections.  CULTURE, BLOOD (ROUTINE X 2)     Status: None   Collection Time    11/12/13  6:48 PM      Result Value Ref Range Status   Specimen Description BLOOD RIGHT ARM   Final   Special Requests BOTTLES DRAWN AEROBIC ONLY 5CC   Final   Culture  Setup Time     Final   Value: 11/13/2013 00:10     Performed at [facility]   Culture     Final   Value:        BLOOD CULTURE RECEIVED NO GROWTH TO DATE CULTURE WILL BE HELD FOR 5 DAYS BEFORE ISSUING A FINAL NEGATIVE REPORT     Performed at [facility]   Report Status PENDING   Incomplete  CULTURE, BLOOD (ROUTINE X 2)     Status: None   Collection Time    11/12/13  7:00 PM      Result Value Ref Range Status   Specimen Description BLOOD RIGHT HAND   Final   Special Requests BOTTLES DRAWN AEROBIC ONLY 8CC   Final   Culture  Setup Time     Final   Value: 11/13/2013 00:10     Performed at [facility]   Culture     Final   Value:        BLOOD CULTURE RECEIVED NO GROWTH TO DATE CULTURE WILL BE HELD FOR 5 DAYS BEFORE ISSUING A FINAL NEGATIVE REPORT     Performed at [facility]   Report Status PENDING   Incomplete     Scheduled Meds: . acyclovir  800 mg Oral BID  . ALPRAZolam  0.5 mg Oral TID  . antiseptic oral rinse  15 mL Mouth Rinse QID  . calcium-vitamin D  1 tablet Oral BID  . chlorhexidine  15 mL Mouth Rinse BID  . dexamethasone  4 mg Intravenous 4 times per day   Or  . dexamethasone  4 mg Oral 4 times per day  .  diltiazem  300 mg Oral Daily  . DULoxetine  60 mg Oral BID  . ferrous sulfate  325 mg Oral Daily  . levothyroxine  150 mcg Oral QAC breakfast  . Linaclotide  145 mcg Oral Daily  . loratadine  10 mg Oral QHS  . mometasone-formoterol  2 puff Inhalation BID  . nicotine  14 mg Transdermal QHS  . pantoprazole  40 mg Oral BID  . potassium chloride SA  20 mEq Oral Daily  . pregabalin  100 mg Oral TID  . tamsulosin  0.4 mg Oral QPC supper  . tiotropium  18 mcg Inhalation Daily  . traZODone  150 mg Oral QHS   Continuous Infusions:    Alveena Taira, DO  [facility] Pager [phone]  If 7PM-7AM, please contact night-coverage [email or web address]  Password [email or web address] 11/15/2013, 5:18 PM   LOS: 4 days

## 2013-11-15 NOTE — Consult Note (Signed)
Physical Medicine and Rehabilitation Consult Reason for Consult: Paraparesis Referring Physician:  Dr. Ronnald Ramp    HPI: [name] is a 53 y.o. female with history of severe COPD--oxygen dependent, multiple episodes of PNA this winter, , chronic thoracic pain with compression fracture who was admitted on 11/11/13 with complaints of urinary retention as well as progressive weakness in BLE with inability to walk for a week. Patient reported epidural injection approx 9 days prior to admission with onset of LE numbness in 48 hours and progressive symptoms. MRI L-spine negative for stenosis and Dr. Ronnald Ramp consulted for input.  He recommended  MRI T-spine which showed "Abnormal structures within the spinal canal at the T8 level, larger on the left than on the right, which appear to compress the spinal cord".   She was cleared for surgery by pulmonary and was taken to OR on 11/12/13 for T8 thoracic epidural mass resection. BLE dopplers done due to LLE edema and negative for DVT. Pathology pending.  Extubated on 11/13/13 and PT evaluation done yesterday. Patient continues with paraparesis and requires verbal and tactile cues for mobility. MD and rehab team recommending CIR.    Review of Systems  Constitutional: Positive for malaise/fatigue.  HENT: Negative for hearing loss.   Eyes: Positive for blurred vision. Negative for double vision.  Respiratory: Positive for cough, shortness of breath and wheezing.   Cardiovascular: Negative for chest pain and palpitations.  Gastrointestinal: Positive for constipation (No BM X 9 days). Negative for nausea, vomiting and abdominal pain.  Genitourinary:       Urinary retention--foley in place  Musculoskeletal: Positive for back pain, myalgias and neck pain.  Neurological: Positive for dizziness, sensory change, focal weakness and headaches.    Past Medical History  Diagnosis Date  . Right-sided heart failure   . COPD (chronic obstructive pulmonary  disease)   . Emphysema   . Seizures   . Thyroid disease   . Depression   . Emphysema   . Arthritis     back and legs  . Blood transfusion without reported diagnosis   . High cholesterol   . Anxiety   . Nerves   . Panic attack   . Thyroid disorder   . Hypothyroidism   . Pneumonia   . CHF (congestive heart failure)   . On home O2     4L N/C chronic  . Fibromyalgia    Past Surgical History  Procedure Laterality Date  . Abdominal hysterectomy    . Cesarean section    . Cholecystectomy    . Laser removal condyloma     Family History  Problem Relation Age of Onset  . Cancer Father    Social History: Married. Lives with family who can assist past discharge. Disabled. Independent prior to a week ago she reports that she quit smoking about 3 months ago. She has never used smokeless tobacco. She reports that she does not drink alcohol or use illicit drugs.   Allergies  Allergen Reactions  . Tape Itching and Rash   Medications Prior to Admission  Medication Sig Dispense Refill  . acyclovir (ZOVIRAX) 800 MG tablet Take 800 mg by mouth 2 (two) times daily.      Marland Kitchen albuterol (PROVENTIL) (2.5 MG/3ML) 0.083% nebulizer solution Take 2.5 mg by nebulization 4 (four) times daily.      Marland Kitchen ALPRAZolam (XANAX) 0.5 MG tablet Take 0.5 mg by mouth 3 (three) times daily.       . Calcium Carb-Cholecalciferol (  CALCIUM 600 + D PO) Take 1 tablet by mouth 2 (two) times daily.      . Cyanocobalamin (VITAMIN B-12) 5000 MCG SUBL Place 5,000 mcg under the tongue daily.      Marland Kitchen diltiazem (TIAZAC) 300 MG 24 hr capsule Take 300 mg by mouth daily.      . DULoxetine (CYMBALTA) 60 MG capsule Take 60 mg by mouth 2 (two) times daily.      . ferrous sulfate 325 (65 FE) MG EC tablet Take 325 mg by mouth daily.      . Fluticasone-Salmeterol (ADVAIR) 250-50 MCG/DOSE AEPB Inhale 1 puff into the lungs every 12 (twelve) hours.      . furosemide (LASIX) 40 MG tablet Take 40 mg by mouth daily.      Marland Kitchen levothyroxine  (SYNTHROID, LEVOTHROID) 150 MCG tablet Take 150 mcg by mouth daily.      . Linaclotide (LINZESS) 145 MCG CAPS capsule Take 145 mcg by mouth daily.      Marland Kitchen loratadine (CLARITIN) 10 MG tablet Take 10 mg by mouth at bedtime.      Marland Kitchen oxyCODONE (ROXICODONE) 15 MG immediate release tablet Take 15 mg by mouth every 6 (six) hours as needed for pain.      . pantoprazole (PROTONIX) 40 MG tablet Take 1 tablet (40 mg total) by mouth 2 (two) times daily.  60 tablet  2  . potassium chloride SA (K-DUR,KLOR-CON) 20 MEQ tablet Take 20 mEq by mouth daily.      . pregabalin (LYRICA) 100 MG capsule Take 100 mg by mouth 3 (three) times daily.      Marland Kitchen tiotropium (SPIRIVA) 18 MCG inhalation capsule Place 18 mcg into inhaler and inhale daily.      . traZODone (DESYREL) 150 MG tablet Take 150 mg by mouth at bedtime.        Home: Home Living Family/patient expects to be discharged to:: Private residence Living Arrangements: Spouse/significant other;Children Available Help at Discharge: Family;Available 24 hours/day Type of Home: Mobile home Home Access: Stairs to enter Entrance Stairs-Number of Steps: 4 Entrance Stairs-Rails: Left Home Layout: One level Home Equipment: Cane - single point;Bedside commode;Shower seat;Walker - 2 wheels  Functional History: Prior Function Level of Independence: Independent with assistive device(s) Comments: 4L 02 at home Functional Status:  Mobility: Bed Mobility Overal bed mobility: Needs Assistance Bed Mobility: Rolling;Sidelying to Sit Rolling: Min assist Sidelying to sit: Mod assist General bed mobility comments: verbal cues for safety, hand placement and precautions Transfers Overall transfer level: Needs assistance Equipment used: 2 person hand held assist Transfers: Sit to/from Stand;Stand Pivot Transfers Sit to Stand: +2 physical assistance;Max assist Stand pivot transfers: +2 physical assistance;Max assist General transfer comment: dec proprioception and awareness  of foot placement on floor. verbal and tactile cues for step sequencing and posture correction. 2 person max A blocking Bilat knees due to buckling. L LE more involved than R Ambulation/Gait Ambulation/Gait assistance: +2 physical assistance;Max assist Ambulation Distance (Feet): 2 Feet Assistive device: 2 person hand held assist Gait Pattern/deviations: Step-to pattern;Decreased stride length;Shuffle;Steppage;Decreased weight shift to right;Decreased weight shift to left;Narrow base of support;Trunk flexed Gait velocity: slow General Gait Details: 2 side steps to the R and 2 steps forward for a bed to chair transfer. Pt req verbal and tactile cues for step sequencing. 2 person max A guarding Bilat LE for knee buckling    ADL:    Cognition: Cognition Overall Cognitive Status: Within Functional Limits for tasks assessed Orientation Level: Oriented X4 Cognition  Arousal/Alertness: Awake/alert Behavior During Therapy: WFL for tasks assessed/performed Overall Cognitive Status: Within Functional Limits for tasks assessed  Blood pressure 111/72, pulse 79, temperature 97.5 F (36.4 C), temperature source Oral, resp. rate 18, height 5\' 2"  (1.575 m), weight 73.755 kg (162 lb 9.6 oz), SpO2 93.00%. Physical Exam  Nursing note and vitals reviewed. Constitutional: She is oriented to person, place, and time. She appears well-developed and well-nourished.  HENT:  Head: Normocephalic and atraumatic.  Eyes: Conjunctivae are normal.  Cardiovascular: Normal rate and regular rhythm.   Respiratory: No respiratory distress. She has wheezes (prolonged expiratory wheezes. ).  GI: Soft. Bowel sounds are normal. She exhibits no distension. There is no tenderness.  Musculoskeletal: She exhibits no edema.  Left leg shorter than right.   Neurological: She is alert and oriented to person, place, and time.  Paraparesis with sensory deficits. DTR absent BLE. LLE tr to 1/5 HF,KE. Tr at ankle. RLE is 2hf, 2+ke, 3  ankle. Sensation 1/2 LLE 1+ RLE.   Skin: Skin is warm and dry.  Psychiatric: She has a normal mood and affect. Her behavior is normal.    No results found for this or any previous visit (from the past 24 hour(s)). No results found.  Assessment/Plan: Diagnosis: Thoracic mass with incomplete paraplegia, s/p resection 1. Does the need for close, 24 hr/day medical supervision in concert with the patient's rehab needs make it unreasonable for this patient to be served in a less intensive setting? Yes 2. Co-Morbidities requiring supervision/potential complications: copd, FMS, neurogenic bowel/bladder 3. Due to bladder management, bowel management, safety, skin/wound care, disease management, medication administration, pain management and patient education, does the patient require 24 hr/day rehab nursing? Yes 4. Does the patient require coordinated care of a physician, rehab nurse, PT (1-2 hrs/day, 5 days/week) and OT (1-2 hrs/day, 5 days/week) to address physical and functional deficits in the context of the above medical diagnosis(es)? Yes Addressing deficits in the following areas: balance, endurance, locomotion, strength, transferring, bowel/bladder control, bathing, dressing, feeding, grooming, toileting and psychosocial support 5. Can the patient actively participate in an intensive therapy program of at least 3 hrs of therapy per day at least 5 days per week? Yes 6. The potential for patient to make measurable gains while on inpatient rehab is excellent 7. Anticipated functional outcomes upon discharge from inpatient rehab are modified independent and supervision  with PT, modified independent and min assist with OT, n/a with SLP. 8. Estimated rehab length of stay to reach the above functional goals is: 20-24 days 9. Does the patient have adequate social supports to accommodate these discharge functional goals? Yes 10. Anticipated D/C setting: Home 11. Anticipated post D/C treatments: HH  therapy and Outpatient therapy 12. Overall Rehab/Functional Prognosis: good  RECOMMENDATIONS: This patient's condition is appropriate for continued rehabilitative care in the following setting: CIR Patient has agreed to participate in recommended program. Yes Note that insurance prior authorization may be required for reimbursement for recommended care.  Comment: Rehab Admissions Coordinator to follow up.  Thanks,  Meredith Staggers, MD, Mellody Drown     11/15/2013

## 2013-11-16 ENCOUNTER — Inpatient Hospital Stay (HOSPITAL_COMMUNITY): Payer: Medicare Other

## 2013-11-16 ENCOUNTER — Inpatient Hospital Stay (HOSPITAL_COMMUNITY)
Admission: RE | Admit: 2013-11-16 | Discharge: 2013-12-09 | DRG: 945 | Disposition: A | Discharge status: Home Health | Payer: Medicare Other | Source: Intra-hospital | Attending: Physical Medicine & Rehabilitation | Admitting: Physical Medicine & Rehabilitation

## 2013-11-16 ENCOUNTER — Encounter (HOSPITAL_COMMUNITY): Payer: Self-pay | Admitting: *Deleted

## 2013-11-16 DIAGNOSIS — G4700 Insomnia, unspecified: Secondary | ICD-10-CM | POA: Diagnosis present

## 2013-11-16 DIAGNOSIS — Z9981 Dependence on supplemental oxygen: Secondary | ICD-10-CM

## 2013-11-16 DIAGNOSIS — D497 Neoplasm of unspecified behavior of endocrine glands and other parts of nervous system: Secondary | ICD-10-CM

## 2013-11-16 DIAGNOSIS — A498 Other bacterial infections of unspecified site: Secondary | ICD-10-CM

## 2013-11-16 DIAGNOSIS — K560 Paralytic ileus: Secondary | ICD-10-CM | POA: Diagnosis present

## 2013-11-16 DIAGNOSIS — G8929 Other chronic pain: Secondary | ICD-10-CM | POA: Diagnosis present

## 2013-11-16 DIAGNOSIS — Z8619 Personal history of other infectious and parasitic diseases: Secondary | ICD-10-CM

## 2013-11-16 DIAGNOSIS — G8220 Paraplegia, unspecified: Secondary | ICD-10-CM | POA: Diagnosis present

## 2013-11-16 DIAGNOSIS — F341 Dysthymic disorder: Secondary | ICD-10-CM

## 2013-11-16 DIAGNOSIS — R339 Retention of urine, unspecified: Secondary | ICD-10-CM | POA: Diagnosis present

## 2013-11-16 DIAGNOSIS — Z9079 Acquired absence of other genital organ(s): Secondary | ICD-10-CM

## 2013-11-16 DIAGNOSIS — G959 Disease of spinal cord, unspecified: Secondary | ICD-10-CM

## 2013-11-16 DIAGNOSIS — A491 Streptococcal infection, unspecified site: Secondary | ICD-10-CM

## 2013-11-16 DIAGNOSIS — R5381 Other malaise: Secondary | ICD-10-CM

## 2013-11-16 DIAGNOSIS — R0902 Hypoxemia: Secondary | ICD-10-CM | POA: Diagnosis not present

## 2013-11-16 DIAGNOSIS — D492 Neoplasm of unspecified behavior of bone, soft tissue, and skin: Secondary | ICD-10-CM | POA: Diagnosis present

## 2013-11-16 DIAGNOSIS — Z8659 Personal history of other mental and behavioral disorders: Secondary | ICD-10-CM

## 2013-11-16 DIAGNOSIS — J438 Other emphysema: Secondary | ICD-10-CM | POA: Diagnosis present

## 2013-11-16 DIAGNOSIS — J441 Chronic obstructive pulmonary disease with (acute) exacerbation: Secondary | ICD-10-CM

## 2013-11-16 DIAGNOSIS — N189 Chronic kidney disease, unspecified: Secondary | ICD-10-CM

## 2013-11-16 DIAGNOSIS — Z5189 Encounter for other specified aftercare: Principal | ICD-10-CM

## 2013-11-16 DIAGNOSIS — J449 Chronic obstructive pulmonary disease, unspecified: Secondary | ICD-10-CM

## 2013-11-16 DIAGNOSIS — K592 Neurogenic bowel, not elsewhere classified: Secondary | ICD-10-CM | POA: Diagnosis present

## 2013-11-16 DIAGNOSIS — G589 Mononeuropathy, unspecified: Secondary | ICD-10-CM | POA: Diagnosis present

## 2013-11-16 DIAGNOSIS — F411 Generalized anxiety disorder: Secondary | ICD-10-CM | POA: Diagnosis present

## 2013-11-16 DIAGNOSIS — I5032 Chronic diastolic (congestive) heart failure: Secondary | ICD-10-CM

## 2013-11-16 DIAGNOSIS — J189 Pneumonia, unspecified organism: Secondary | ICD-10-CM | POA: Diagnosis present

## 2013-11-16 DIAGNOSIS — F329 Major depressive disorder, single episode, unspecified: Secondary | ICD-10-CM | POA: Diagnosis present

## 2013-11-16 DIAGNOSIS — B965 Pseudomonas (aeruginosa) (mallei) (pseudomallei) as the cause of diseases classified elsewhere: Secondary | ICD-10-CM | POA: Diagnosis present

## 2013-11-16 DIAGNOSIS — N390 Urinary tract infection, site not specified: Secondary | ICD-10-CM | POA: Diagnosis present

## 2013-11-16 DIAGNOSIS — J9620 Acute and chronic respiratory failure, unspecified whether with hypoxia or hypercapnia: Secondary | ICD-10-CM | POA: Diagnosis present

## 2013-11-16 DIAGNOSIS — R410 Disorientation, unspecified: Secondary | ICD-10-CM

## 2013-11-16 DIAGNOSIS — F17200 Nicotine dependence, unspecified, uncomplicated: Secondary | ICD-10-CM | POA: Diagnosis present

## 2013-11-16 DIAGNOSIS — IMO0001 Reserved for inherently not codable concepts without codable children: Secondary | ICD-10-CM

## 2013-11-16 DIAGNOSIS — Z113 Encounter for screening for infections with a predominantly sexual mode of transmission: Secondary | ICD-10-CM

## 2013-11-16 DIAGNOSIS — I509 Heart failure, unspecified: Secondary | ICD-10-CM | POA: Diagnosis present

## 2013-11-16 DIAGNOSIS — N319 Neuromuscular dysfunction of bladder, unspecified: Secondary | ICD-10-CM | POA: Diagnosis present

## 2013-11-16 DIAGNOSIS — F3289 Other specified depressive episodes: Secondary | ICD-10-CM | POA: Diagnosis present

## 2013-11-16 DIAGNOSIS — F410 Panic disorder [episodic paroxysmal anxiety] without agoraphobia: Secondary | ICD-10-CM | POA: Diagnosis present

## 2013-11-16 LAB — CBC
HCT: 36.9 % (ref 36.0–46.0)
HEMOGLOBIN: 12 g/dL (ref 12.0–15.0)
MCH: 33.1 pg (ref 26.0–34.0)
MCHC: 32.5 g/dL (ref 30.0–36.0)
MCV: 101.9 fL — ABNORMAL HIGH (ref 78.0–100.0)
PLATELETS: 206 10*3/uL (ref 150–400)
RBC: 3.62 MIL/uL — ABNORMAL LOW (ref 3.87–5.11)
RDW: 16 % — ABNORMAL HIGH (ref 11.5–15.5)
WBC: 7.5 10*3/uL (ref 4.0–10.5)

## 2013-11-16 LAB — EXPECTORATED SPUTUM ASSESSMENT W REFEX TO RESP CULTURE: SPECIAL REQUESTS: NORMAL

## 2013-11-16 LAB — BASIC METABOLIC PANEL
BUN: 42 mg/dL — ABNORMAL HIGH (ref 6–23)
CALCIUM: 8.8 mg/dL (ref 8.4–10.5)
CO2: 24 mEq/L (ref 19–32)
Chloride: 105 mEq/L (ref 96–112)
Creatinine, Ser: 1.15 mg/dL — ABNORMAL HIGH (ref 0.50–1.10)
GFR calc Af Amer: 63 mL/min — ABNORMAL LOW (ref >90)
GFR, EST NON AFRICAN AMERICAN: 54 mL/min — AB (ref >90)
Glucose, Bld: 126 mg/dL — ABNORMAL HIGH (ref 70–99)
Potassium: 5.2 mEq/L (ref 3.7–5.3)
SODIUM: 141 meq/L (ref 137–147)

## 2013-11-16 LAB — EXPECTORATED SPUTUM ASSESSMENT W GRAM STAIN, RFLX TO RESP C

## 2013-11-16 MED ORDER — BIOTENE DRY MOUTH MT LIQD
15.0000 mL | Freq: Four times a day (QID) | OROMUCOSAL | Status: DC
  Administered 2013-11-17 – 2013-12-09 (×53): 15 mL via OROMUCOSAL

## 2013-11-16 MED ORDER — TAMSULOSIN HCL 0.4 MG PO CAPS: 0.4000 mg | ORAL_CAPSULE | Freq: Every day | ORAL | Status: AC

## 2013-11-16 MED ORDER — CHLORHEXIDINE GLUCONATE 0.12 % MT SOLN
15.0000 mL | Freq: Two times a day (BID) | OROMUCOSAL | Status: DC
  Administered 2013-11-16 – 2013-12-09 (×43): 15 mL via OROMUCOSAL
  Filled 2013-11-16 (×49): qty 15

## 2013-11-16 MED ORDER — ACETAMINOPHEN 325 MG PO TABS: 325.0000 mg | ORAL_TABLET | ORAL | Status: DC | PRN

## 2013-11-16 MED ORDER — FUROSEMIDE 20 MG PO TABS
20.0000 mg | ORAL_TABLET | Freq: Every day | ORAL | Status: DC
  Administered 2013-11-16 – 2013-11-22 (×7): 20 mg via ORAL
  Filled 2013-11-16 (×10): qty 1

## 2013-11-16 MED ORDER — DEXAMETHASONE 4 MG PO TABS
4.0000 mg | ORAL_TABLET | Freq: Three times a day (TID) | ORAL | Status: DC
  Administered 2013-11-16 – 2013-11-30 (×41): 4 mg via ORAL
  Filled 2013-11-16 (×47): qty 1

## 2013-11-16 MED ORDER — BISACODYL 10 MG RE SUPP: 10.0000 mg | Freq: Every day | RECTAL | Status: DC | PRN

## 2013-11-16 MED ORDER — ACYCLOVIR 800 MG PO TABS
800.0000 mg | ORAL_TABLET | Freq: Two times a day (BID) | ORAL | Status: DC
  Administered 2013-11-16 – 2013-12-09 (×46): 800 mg via ORAL
  Filled 2013-11-16 (×52): qty 1

## 2013-11-16 MED ORDER — LEVOTHYROXINE SODIUM 150 MCG PO TABS
150.0000 ug | ORAL_TABLET | Freq: Every day | ORAL | Status: DC
  Administered 2013-11-17 – 2013-12-08 (×22): 150 ug via ORAL
  Filled 2013-11-16 (×26): qty 1

## 2013-11-16 MED ORDER — CALCIUM CARBONATE-VITAMIN D 500-200 MG-UNIT PO TABS
1.0000 | ORAL_TABLET | Freq: Two times a day (BID) | ORAL | Status: DC
  Administered 2013-11-16 – 2013-12-09 (×46): 1 via ORAL
  Filled 2013-11-16 (×51): qty 1

## 2013-11-16 MED ORDER — TAMSULOSIN HCL 0.4 MG PO CAPS
0.4000 mg | ORAL_CAPSULE | Freq: Every day | ORAL | Status: DC
  Administered 2013-11-16 – 2013-12-08 (×23): 0.4 mg via ORAL
  Filled 2013-11-16 (×24): qty 1

## 2013-11-16 MED ORDER — ALBUTEROL SULFATE (2.5 MG/3ML) 0.083% IN NEBU: 2.5000 mg | INHALATION_SOLUTION | Freq: Four times a day (QID) | RESPIRATORY_TRACT | Status: DC

## 2013-11-16 MED ORDER — TRAZODONE HCL 50 MG PO TABS
150.0000 mg | ORAL_TABLET | Freq: Every day | ORAL | Status: DC
  Administered 2013-11-16 – 2013-12-08 (×23): 150 mg via ORAL
  Filled 2013-11-16 (×22): qty 3

## 2013-11-16 MED ORDER — PANTOPRAZOLE SODIUM 40 MG PO TBEC
40.0000 mg | DELAYED_RELEASE_TABLET | Freq: Two times a day (BID) | ORAL | Status: DC
  Administered 2013-11-16 – 2013-12-09 (×46): 40 mg via ORAL
  Filled 2013-11-16 (×45): qty 1

## 2013-11-16 MED ORDER — NICOTINE 14 MG/24HR TD PT24: 14.0000 mg | MEDICATED_PATCH | Freq: Every day | TRANSDERMAL | Status: AC

## 2013-11-16 MED ORDER — PROCHLORPERAZINE MALEATE 5 MG PO TABS
5.0000 mg | ORAL_TABLET | Freq: Four times a day (QID) | ORAL | Status: DC | PRN
  Filled 2013-11-16: qty 2

## 2013-11-16 MED ORDER — DEXAMETHASONE 4 MG PO TABS: 4.0000 mg | ORAL_TABLET | Freq: Four times a day (QID) | ORAL | Status: DC

## 2013-11-16 MED ORDER — GUAIFENESIN-DM 100-10 MG/5ML PO SYRP: 5.0000 mL | ORAL_SOLUTION | Freq: Four times a day (QID) | ORAL | Status: DC | PRN

## 2013-11-16 MED ORDER — OXYCODONE HCL 15 MG PO TABS: 15.0000 mg | ORAL_TABLET | Freq: Four times a day (QID) | ORAL | Status: DC | PRN

## 2013-11-16 MED ORDER — POTASSIUM CHLORIDE CRYS ER 20 MEQ PO TBCR
20.0000 meq | EXTENDED_RELEASE_TABLET | Freq: Every day | ORAL | Status: DC
  Administered 2013-11-17 – 2013-12-05 (×19): 20 meq via ORAL
  Filled 2013-11-16 (×23): qty 1

## 2013-11-16 MED ORDER — ONDANSETRON HCL 4 MG PO TABS: 4.0000 mg | ORAL_TABLET | Freq: Four times a day (QID) | ORAL | Status: DC | PRN

## 2013-11-16 MED ORDER — MENTHOL 3 MG MT LOZG
1.0000 | LOZENGE | OROMUCOSAL | Status: DC | PRN
  Filled 2013-11-16: qty 9

## 2013-11-16 MED ORDER — METHOCARBAMOL 500 MG PO TABS
500.0000 mg | ORAL_TABLET | Freq: Four times a day (QID) | ORAL | Status: DC | PRN
Start: 2013-11-16 — End: 2013-12-09
  Administered 2013-11-19 – 2013-12-02 (×7): 500 mg via ORAL
  Filled 2013-11-16 (×7): qty 1

## 2013-11-16 MED ORDER — ONDANSETRON HCL 4 MG/2ML IJ SOLN: 4.0000 mg | Freq: Four times a day (QID) | INTRAMUSCULAR | Status: DC | PRN

## 2013-11-16 MED ORDER — LORATADINE 5 MG/5ML PO SYRP
10.0000 mg | ORAL_SOLUTION | Freq: Every day | ORAL | Status: DC
  Administered 2013-11-16 – 2013-12-08 (×23): 10 mg via ORAL
  Filled 2013-11-16 (×26): qty 10

## 2013-11-16 MED ORDER — TRAMADOL HCL 50 MG PO TABS: 50.0000 mg | ORAL_TABLET | Freq: Four times a day (QID) | ORAL | Status: DC | PRN

## 2013-11-16 MED ORDER — DULOXETINE HCL 60 MG PO CPEP
60.0000 mg | ORAL_CAPSULE | Freq: Two times a day (BID) | ORAL | Status: DC
  Administered 2013-11-16 – 2013-12-09 (×46): 60 mg via ORAL
  Filled 2013-11-16 (×52): qty 1

## 2013-11-16 MED ORDER — FLEET ENEMA 7-19 GM/118ML RE ENEM: 1.0000 | ENEMA | Freq: Every day | RECTAL | Status: DC | PRN

## 2013-11-16 MED ORDER — ALUM & MAG HYDROXIDE-SIMETH 200-200-20 MG/5ML PO SUSP: 30.0000 mL | ORAL | Status: DC | PRN

## 2013-11-16 MED ORDER — MOMETASONE FURO-FORMOTEROL FUM 100-5 MCG/ACT IN AERO
2.0000 | INHALATION_SPRAY | Freq: Two times a day (BID) | RESPIRATORY_TRACT | Status: DC
  Administered 2013-11-17 – 2013-12-09 (×38): 2 via RESPIRATORY_TRACT
  Filled 2013-11-16: qty 8.8

## 2013-11-16 MED ORDER — TRAZODONE HCL 50 MG PO TABS
25.0000 mg | ORAL_TABLET | Freq: Every evening | ORAL | Status: DC | PRN
  Filled 2013-11-16: qty 1

## 2013-11-16 MED ORDER — ACYCLOVIR 800 MG PO TABS: 800.0000 mg | ORAL_TABLET | Freq: Two times a day (BID) | ORAL | Status: AC

## 2013-11-16 MED ORDER — LINACLOTIDE 145 MCG PO CAPS
145.0000 ug | ORAL_CAPSULE | Freq: Every day | ORAL | Status: DC
  Administered 2013-11-17 – 2013-12-09 (×23): 145 ug via ORAL
  Filled 2013-11-16 (×24): qty 1

## 2013-11-16 MED ORDER — TIOTROPIUM BROMIDE MONOHYDRATE 18 MCG IN CAPS
18.0000 ug | ORAL_CAPSULE | Freq: Every day | RESPIRATORY_TRACT | Status: DC
  Administered 2013-11-17 – 2013-12-05 (×17): 18 ug via RESPIRATORY_TRACT
  Filled 2013-11-16 (×7): qty 5

## 2013-11-16 MED ORDER — ALUM & MAG HYDROXIDE-SIMETH 200-200-20 MG/5ML PO SUSP: 30.0000 mL | Freq: Four times a day (QID) | ORAL | Status: DC | PRN

## 2013-11-16 MED ORDER — PROCHLORPERAZINE 25 MG RE SUPP
12.5000 mg | Freq: Four times a day (QID) | RECTAL | Status: DC | PRN
Start: 2013-11-16 — End: 2013-12-09
  Filled 2013-11-16: qty 1

## 2013-11-16 MED ORDER — ALBUTEROL SULFATE (2.5 MG/3ML) 0.083% IN NEBU: 2.5000 mg | INHALATION_SOLUTION | RESPIRATORY_TRACT | Status: DC | PRN

## 2013-11-16 MED ORDER — OXYCODONE HCL 5 MG PO TABS
15.0000 mg | ORAL_TABLET | Freq: Four times a day (QID) | ORAL | Status: DC | PRN
  Administered 2013-11-16 – 2013-11-24 (×12): 15 mg via ORAL
  Filled 2013-11-16 (×12): qty 3

## 2013-11-16 MED ORDER — NICOTINE 14 MG/24HR TD PT24
14.0000 mg | MEDICATED_PATCH | Freq: Every day | TRANSDERMAL | Status: DC
  Administered 2013-11-17 – 2013-11-28 (×12): 14 mg via TRANSDERMAL
  Filled 2013-11-16 (×15): qty 1

## 2013-11-16 MED ORDER — PHENOL 1.4 % MT LIQD
1.0000 | OROMUCOSAL | Status: DC | PRN
  Filled 2013-11-16: qty 177

## 2013-11-16 MED ORDER — ALPRAZOLAM 0.5 MG PO TABS
0.5000 mg | ORAL_TABLET | Freq: Three times a day (TID) | ORAL | Status: DC
  Administered 2013-11-16 – 2013-12-09 (×68): 0.5 mg via ORAL
  Filled 2013-11-16 (×68): qty 1

## 2013-11-16 MED ORDER — DILTIAZEM HCL ER BEADS 300 MG PO CP24
300.0000 mg | ORAL_CAPSULE | Freq: Every day | ORAL | Status: DC
  Administered 2013-11-17 – 2013-12-09 (×23): 300 mg via ORAL
  Filled 2013-11-16 (×24): qty 1

## 2013-11-16 MED ORDER — PREGABALIN 50 MG PO CAPS
100.0000 mg | ORAL_CAPSULE | Freq: Three times a day (TID) | ORAL | Status: DC
  Administered 2013-11-16 – 2013-12-09 (×67): 100 mg via ORAL
  Filled 2013-11-16 (×67): qty 2

## 2013-11-16 MED ORDER — FERROUS SULFATE 325 (65 FE) MG PO TABS
325.0000 mg | ORAL_TABLET | Freq: Every day | ORAL | Status: DC
  Administered 2013-11-17 – 2013-12-09 (×23): 325 mg via ORAL
  Filled 2013-11-16 (×26): qty 1

## 2013-11-16 MED ORDER — ALBUTEROL SULFATE (2.5 MG/3ML) 0.083% IN NEBU
2.5000 mg | INHALATION_SOLUTION | Freq: Four times a day (QID) | RESPIRATORY_TRACT | Status: DC
  Administered 2013-11-16 – 2013-11-21 (×15): 2.5 mg via RESPIRATORY_TRACT
  Filled 2013-11-16 (×18): qty 3

## 2013-11-16 MED ORDER — ACETAMINOPHEN 325 MG PO TABS: 650.0000 mg | ORAL_TABLET | Freq: Four times a day (QID) | ORAL | Status: DC | PRN

## 2013-11-16 MED ORDER — DIPHENHYDRAMINE HCL 12.5 MG/5ML PO ELIX: 12.5000 mg | ORAL_SOLUTION | Freq: Four times a day (QID) | ORAL | Status: DC | PRN

## 2013-11-16 MED ORDER — PROCHLORPERAZINE EDISYLATE 5 MG/ML IJ SOLN
5.0000 mg | Freq: Four times a day (QID) | INTRAMUSCULAR | Status: DC | PRN
  Filled 2013-11-16: qty 2

## 2013-11-16 NOTE — Discharge Summary (Signed)
Physician Discharge Summary  [name] [id number] DOB: 15-Jan-1961 DOA: 11/11/2013  PCP: [name], NP  Admit date: 11/11/2013 Discharge date: 11/16/2013  Time spent: 35 minutes  Recommendations for Outpatient Follow-up:  1. 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 female with Multiple Medical Problems Including COPD, CHF, Hypothyroid, Fibromyalgia, Chronic Low Back Pain and Peripheral Neuropathy who presents to the ED with complaints of progressive weakness of her left leg over 1 week worse over the past 3 days. She report that her symptoms began when she felt a "shock to her body" on last Saturday, and by Wednesday of this week she had weakness and numbness in her left leg and was not able to walk. She reports having 2 falls since, when others were trying to help her walk. She reports having low back pain which increased after the falls. She was unable to pass her urine today and came to the ED, and a straight cath was performed and 800 cc was removed. An MRI of the Lumbar Spine was performed which returned and was discussed with Neuro Surgery On call and there results were negative for spinal stenosis or Cauda Equina Syndrome. She was referred for medical admission. She denies having any recent vaccinations.   Hospital Course:  53 year old female with COPD (4L [place]), diastolic CHF, fibromyalgia, peripheral neuropathy, presented with left leg weakness worsening over 3 days. The patient had  2 falls due to leg weakness. Patient also had urine retention for which a foley catheter was placed. She also reported increasing low back pain after fall. MRI of the thoracic spine on 11/12/2013 showed 80% compression fracture of T8 with an approximately 7 mm in diameter entity. These show pronounced signal loss on T2 and STIR imaging. There was concern of a meningioma versus intraspinal hematoma causing mass effect upon the spinal cord. Neurosurgery was consulted. On 11/12/2013, Dr. Sherley Bounds performed a T8 laminectomy for resection of epidural mass utilizing microscopic dissection. Pathology shows fibroadipose tissue with degenerative cartilaginous tissue with abundant necrosis. There was no malignancy. There was no hematoma noted on the operative note. The patient remained ventilated, and she was subsequently extubated on 11/13/2013. She was transitioned to the medical floor on 11/14/2013. Blood cultures remained negative. The patient remains afebrile and hemodynamically stable. She was discharged to inpatient rehab on 11/16/13 in stable condition.   Procedures: 11/12/2013 T8 laminectomy with resection of epidural mass 10/18/2013 Heart catheterization --no objective lesions   Consultations:  Neurosurgery  Neurology  Pulmonary critical care medicine  Physical therapy  Discharge Exam: Filed Vitals:   11/16/13 1428  BP: 110/73  Pulse: 89  Temp: 97.7 F (36.5 C)  Resp: 18    General: Pt is alert, follows commands appropriately, not in acute distress  HEENT: No icterus, No thrush, [place]/AT  Cardiovascular: RRR, S1/S2, no rubs, no gallops  Respiratory: mild bibasilar wheezing. No rhonchi. Good air movement.  Abdomen: Soft/+BS,  non tender, non distended, no guarding  Extremities: No edema, No lymphangitis, No petechiae, No rashes, no synovitis   Discharge Instructions  Discharge Orders   Future Appointments Provider Department Dept Phone   12/01/2013 12:45 PM Adam Phenix, MD  [facility] [phone]   Future Orders Complete By Expires   Call MD for:  difficulty breathing, headache or visual disturbances  As directed    Call MD for:  extreme fatigue  As directed    Call MD for:  persistant nausea and vomiting  As directed    Call MD for:  temperature >100.4  As directed    Diet - low sodium heart healthy  As directed    Increase activity slowly  As directed        Medication List         acetaminophen 325 MG tablet  Commonly known as:  TYLENOL  Take 2 tablets (650 mg total) by mouth every 6 (six) hours as needed for mild pain (or Fever >/= 101).     acyclovir 800 MG tablet  Commonly known as:  ZOVIRAX  Take 800 mg by mouth 2 (two) times daily.     acyclovir 800 MG tablet  Commonly known as:  ZOVIRAX  Take 1 tablet (800 mg total) by mouth 2 (two) times daily.     albuterol (2.5 MG/3ML) 0.083% nebulizer solution  Commonly known as:  PROVENTIL  Take 2.5 mg by nebulization 4 (four) times daily.     albuterol (2.5 MG/3ML) 0.083% nebulizer solution  Commonly known as:  PROVENTIL  Take 3 mLs (2.5 mg total) by nebulization every 2 (two) hours as needed for wheezing or shortness of breath.     ALPRAZolam 0.5 MG tablet  Commonly known as:  XANAX  Take 0.5 mg by mouth 3 (three) times daily.     CALCIUM 600 + D PO  Take 1 tablet by mouth 2 (two) times daily.     dexamethasone 4 MG tablet  Commonly known as:  DECADRON  Take 1 tablet (4 mg total) by mouth every 6 (six) hours.     diltiazem 300 MG 24 hr capsule  Commonly known as:  TIAZAC  Take 300 mg by mouth daily.     DULoxetine 60 MG capsule  Commonly known as:  CYMBALTA  Take 60 mg by mouth 2 (two) times daily.     ferrous sulfate 325 (65 FE) MG EC tablet  Take 325 mg by mouth daily.     Fluticasone-Salmeterol 250-50 MCG/DOSE Aepb  Commonly known as:  ADVAIR  Inhale 1 puff into the lungs every 12 (twelve) hours.     furosemide 40 MG tablet  Commonly known as:  LASIX  Take 40  mg by mouth daily.     levothyroxine 150 MCG tablet  Commonly known as:  SYNTHROID, LEVOTHROID  Take 150 mcg by mouth daily.     LINZESS 145 MCG Caps capsule  Generic drug:  Linaclotide  Take 145 mcg by mouth daily.     loratadine 10 MG tablet  Commonly known as:  CLARITIN  Take 10 mg by mouth at bedtime.     nicotine 14 mg/24hr patch  Commonly known as:  NICODERM CQ - dosed in mg/24 hours  Place 1 patch (14 mg total) onto the skin at bedtime.     oxyCODONE 15 MG immediate release tablet  Commonly known as:  ROXICODONE  Take 1 tablet (15 mg total) by mouth every 6 (six) hours  as needed for pain.     pantoprazole 40 MG tablet  Commonly known as:  PROTONIX  Take 1 tablet (40 mg total) by mouth 2 (two) times daily.     potassium chloride SA 20 MEQ tablet  Commonly known as:  K-DUR,KLOR-CON  Take 20 mEq by mouth daily.     pregabalin 100 MG capsule  Commonly known as:  LYRICA  Take 100 mg by mouth 3 (three) times daily.     tamsulosin 0.4 MG Caps capsule  Commonly known as:  FLOMAX  Take 1 capsule (0.4 mg total) by mouth daily after supper.     tiotropium 18 MCG inhalation capsule  Commonly known as:  SPIRIVA  Place 18 mcg into inhaler and inhale daily.     traZODone 150 MG tablet  Commonly known as:  DESYREL  Take 150 mg by mouth at bedtime.     Vitamin B-12 5000 MCG Subl  Place 5,000 mcg under the tongue daily.       Allergies  Allergen Reactions  . Tape Itching and Rash      The results of significant diagnostics from this hospitalization (including imaging, microbiology, ancillary and laboratory) are listed below for reference.    Significant Diagnostic Studies: Dg Thoracic Spine 2 View  11/12/2013   CLINICAL DATA:  Localizer for thoracic spinal surgery.  EXAM: THORACIC SPINE - 2 VIEW  COMPARISON:  11/12/2013 CT and MR  FINDINGS: Two intraoperative lateral views of the thoracic spine are submitted postoperatively for interpretation.  Film #1 demonstrates  a posterior metallic probe with tip at the the T9-T10 level.  Film #2 demonstrates a posterior metallic probe with tip at the T7-T8 level.  IMPRESSION: Publishing copy Signed   By: Hassan Rowan M.D.   On: 11/12/2013 18:54   Ct Thoracic Spine Wo Contrast  11/12/2013   CLINICAL DATA:  Loss of strength and feeling in the lower extremities. Abnormal MRI. Additional history now available is that the patient had recent thoracic spinal injections in [facility].  EXAM: CT THORACIC SPINE WITHOUT CONTRAST  TECHNIQUE: Multidetector CT imaging of the thoracic spine was performed without intravenous contrast administration. Multiplanar CT image reconstructions were also generated.  COMPARISON:  MRI same day  FINDINGS: The CT scan again shows minor compression deformities at T7 and T9 an a more advanced compression fracture at T8. At T8 there is loss of height of 80%. Retropulsion of 2-3 mm at this level. There are well-circumscribed hyperdense collections within the spinal canal both on the left and on the right at this level. On the left, this measures 14 x 9 x 14 mm. On the right, this measures approximately 6 x 5 x 5 mm. The collections are quite well-circumscribed. They could represent simple hematoma or they could represent the actual injectate from the procedures. Usually, material in the epidural space will spread freely. For some reason, this material is remaining focal in nature. Therefore, it could possibly be subdural. Subdural material also often spreads out. Also, Hounsfield unit density is over 190 which is more dense than blood usually measures. Is it possible to learn exactly what was injected in this patient?  IMPRESSION: Focal hyperdense collections at the T8 level, larger on the left than on the right, occupying a large portion of the spinal canal and compressing the spinal cord. The focal nature of the collections is unusual for either blood or usual injectate in the epidural or even in the  subdural space. Also, the  density is higher than blood normally measures. Therefore, this may be a focal collection of some material that was injected rather than an actual hematoma. The could also be mixture of injectate and hematoma.   Electronically Signed   By: Nelson Chimes M.D.   On: 11/12/2013 15:38   Mr Cervical Spine Wo Contrast  11/12/2013   CLINICAL DATA:  Bilateral lower extremity weakness and loss of sensation.  EXAM: MRI CERVICAL AND thoracic SPINE WITHOUT CONTRAST  TECHNIQUE: Multiplanar and multiecho pulse sequences of the cervical spine, to include the craniocervical junction and cervicothoracic junction, and thoracic spine, were obtained without intravenous contrast.  COMPARISON:  Radiography 07/19/2013. MRI lumbar 11/11/2013. CT chest 09/19/2013.  FINDINGS: MRI CERVICAL SPINE FINDINGS  Alignment is normal. No disc pathology. The spinal canal is widely patent. Ample subarachnoid space surrounds the cord. No osseous or articular pathology. No abnormal cord signal. No evidence of myelitis.  MRI thoracic SPINE FINDINGS  There is no significant finding at T6 or above. The disc at T6-7 shows a shallow protrusion towards the right but no apparent neural compression. There is an old minor compression deformity of the T7 vertebral body anteriorly without retropulsion.  The T8 vertebral body shows 80% loss of height. No visible marrow edema. There is minimal posterior bowing of the posterior margin of the vertebral body, 2 mm at the most. There are intraspinal lesions at this level. The largest is on the left measuring approximately 12 x 8 x 17 mm within the left posterior lateral quadrant of the spinal canal. Within the right lateral side of the spinal canal at the T8-9 disc level, there is an approximately 7 mm in diameter entity. these show pronounced signal loss on T2 and STIR imaging. I cannot see evidence of any thing in this location on the CT scan of 09/19/2013, despite the fracture being present.  The differential diagnosis includes intraspinal masses such as a meningioma, but I would have expected to see that on the previous CT. They could represent extruded disc material or intraspinal hematoma. In any case, there is significant mass effect upon the spinal cord, likely explaining the patient's clinical presentation.  There is mild loss of height at the T9 vertebral body anteriorly without edema. There are minor disc bulges in the lower thoracic spine below that but no compressive pathology.  IMPRESSION: Negative evaluation of the cervical spine.  80% compression fracture at T8. This fracture does not appear changed when compared to the CT scan of 09/19/2013. Minor old compression fractures at T7 and T9. Minimal posterior bowing at T8, approximately 2 mm. Abnormal structures within the spinal canal at the T8 level, larger on the left than on the right, which appear to compress the spinal cord. I cannot see these on the previous chest CT. The differential diagnosis includes pre-existing masses such as meningioma. However, I would have expected to be able to see these on the previous chest CT. Therefore, they could represent intraspinal hematoma or extruded disc material. Urgent surgical consultation is suggested. If further imaging is desired, CT of this region may be of some use.  Critical Value/emergent results were called by telephone at the time of interpretation on 11/12/2013 at 1:55 PM to Dr. Roland Rack , who verbally acknowledged these results.   Electronically Signed   By: Nelson Chimes M.D.   On: 11/12/2013 14:00   Mr Thoracic Spine Wo Contrast  11/12/2013   CLINICAL DATA:  Bilateral lower extremity weakness and loss of sensation.  EXAM: MRI CERVICAL AND thoracic SPINE WITHOUT CONTRAST  TECHNIQUE: Multiplanar and multiecho pulse sequences of the cervical spine, to include the craniocervical junction and cervicothoracic junction, and thoracic spine, were obtained without intravenous  contrast.  COMPARISON:  Radiography 07/19/2013. MRI lumbar 11/11/2013. CT chest 09/19/2013.  FINDINGS: MRI CERVICAL SPINE FINDINGS  Alignment is normal. No disc pathology. The spinal canal is widely patent. Ample subarachnoid space surrounds the cord. No osseous or articular pathology. No abnormal cord signal. No evidence of myelitis.  MRI thoracic SPINE FINDINGS  There is no significant finding at T6 or above. The disc at T6-7 shows a shallow protrusion towards the right but no apparent neural compression. There is an old minor compression deformity of the T7 vertebral body anteriorly without retropulsion.  The T8 vertebral body shows 80% loss of height. No visible marrow edema. There is minimal posterior bowing of the posterior margin of the vertebral body, 2 mm at the most. There are intraspinal lesions at this level. The largest is on the left measuring approximately 12 x 8 x 17 mm within the left posterior lateral quadrant of the spinal canal. Within the right lateral side of the spinal canal at the T8-9 disc level, there is an approximately 7 mm in diameter entity. these show pronounced signal loss on T2 and STIR imaging. I cannot see evidence of any thing in this location on the CT scan of 09/19/2013, despite the fracture being present. The differential diagnosis includes intraspinal masses such as a meningioma, but I would have expected to see that on the previous CT. They could represent extruded disc material or intraspinal hematoma. In any case, there is significant mass effect upon the spinal cord, likely explaining the patient's clinical presentation.  There is mild loss of height at the T9 vertebral body anteriorly without edema. There are minor disc bulges in the lower thoracic spine below that but no compressive pathology.  IMPRESSION: Negative evaluation of the cervical spine.  80% compression fracture at T8. This fracture does not appear changed when compared to the CT scan of 09/19/2013. Minor old  compression fractures at T7 and T9. Minimal posterior bowing at T8, approximately 2 mm. Abnormal structures within the spinal canal at the T8 level, larger on the left than on the right, which appear to compress the spinal cord. I cannot see these on the previous chest CT. The differential diagnosis includes pre-existing masses such as meningioma. However, I would have expected to be able to see these on the previous chest CT. Therefore, they could represent intraspinal hematoma or extruded disc material. Urgent surgical consultation is suggested. If further imaging is desired, CT of this region may be of some use.  Critical Value/emergent results were called by telephone at the time of interpretation on 11/12/2013 at 1:55 PM to Dr. Roland Rack , who verbally acknowledged these results.   Electronically Signed   By: Nelson Chimes M.D.   On: 11/12/2013 14:00   Mr Lumbar Spine Wo Contrast  11/11/2013   CLINICAL DATA:  Low back pain, left leg weakness.  EXAM: MRI LUMBAR SPINE WITHOUT CONTRAST  TECHNIQUE: Multiplanar, multisequence MR imaging was performed. No intravenous contrast was administered.  COMPARISON:  Prior MRI from 11/09/2013.  FINDINGS: For the purposes of this dictation, the lowest well-formed vertebral body is presumed to be the L5-S1 level, and there presumed to be 5 lumbar type vertebral bodies.  Vertebral bodies are normally aligned with preservation of the normal lumbar lordosis. Vertebral body height is stable.  Signal intensity within the vertebral body bone marrow is within normal limits. No acute osseous abnormality. Signal intensity within the visualized spinal cord is normal. The conus medullaris terminates at the L1 level.  At T10-11, seen only on sagittal projection, there is left-sided facet arthrosis with minimal disc bulge. No canal stenosis. There is question of mild left foraminal stenosis.  At T11-12, there is mild diffuse disc bulge without associated stenosis.  At T12-L1, small  right paracentral disc protrusion again noted, unchanged. There is an associated annular fissure. Mild right foraminal stenosis also stable.  At L1-2, no disc bulge or disc protrusion. The intervertebral disc is well hydrated. No facet arthrosis. No canal or neural foraminal stenosis.  At L2-3, there is mild diffuse disc bulge with disc desiccation. No significant canal or neural foraminal stenosis. No facet arthrosis.  At L3-4, no disc bulge or disc protrusion. No significant facet arthrosis. No canal or neural foraminal stenosis.  L4-5, no disc bulge or disc protrusion. The intervertebral discs well hydrated. No significant facet arthrosis. No canal or neural foraminal stenosis.  L5-S1, no disc bulge or disc protrusion identified. Mild epidural lipomatosis noted. No significant facet arthrosis. No canal or neural foraminal stenosis.  Bilateral renal cystic disease again noted, right greater than left. Visualized visceral structures are otherwise unremarkable.  IMPRESSION: 1. Stable appearance of the lumbar spine with no significant degenerative changes identified. Epidural at the level of L5 and the sacrum again noted. 2. Stable small right paracentral disc protrusion at T12-L1 with resultant mild right neural foraminal stenosis. 3. Left-sided facet arthrosis at T10-11 with resultant mild left foraminal narrowing.   Electronically Signed   By: Jeannine Boga M.D.   On: 11/11/2013 23:39   Dg Chest Port 1 View  11/12/2013   CLINICAL DATA:  Endotracheal tube placement  EXAM: PORTABLE CHEST - 1 VIEW  COMPARISON:  DG THORACIC SPINE dated 11/12/2013; CT T SPINE W/O CM dated 11/12/2013; DG RIBS W/ CHEST 3+V*L* dated 10/24/2013  FINDINGS: Endotracheal tube 3.4 cm from carinal. Normal cardiac silhouette. There is bibasilar atelectasis. Small effusions. No pneumothorax. NG tube extends to the stomach.  IMPRESSION: Endotracheal tube in good position.  Bibasilar atelectasis and small effusions.   Electronically Signed    By: Suzy Bouchard M.D.   On: 11/12/2013 22:31   Dg Abd 2 Views  11/16/2013   CLINICAL DATA:  Mid abdominal pain, constipation  EXAM: ABDOMEN - 2 VIEW  COMPARISON:  DG ABD PORTABLE 1V dated 08/20/2013; DG ABDOMEN 2V dated 12/23/2012; CT ABD-PELV W/ CM dated 12/28/2012  FINDINGS: Large colonic stool burden without evidence of obstruction. There is moderate gaseous distention of the stomach. No pneumoperitoneum, pneumatosis or portal venous gas.  Post cholecystectomy.  Post ORIF of the left femoral neck.  Note definitive abnormal intra-abdominal calcifications.  No acute osseus abnormalities.  IMPRESSION: Large colonic stool burden without evidence of obstruction.   Electronically Signed   By: Sandi Mariscal M.D.   On: 11/16/2013 10:23    Microbiology: Recent Results (from the past 240 hour(s))  URINE CULTURE     Status: None   Collection Time    11/12/13  9:51 AM      Result Value Ref Range Status   Specimen Description URINE, CATHETERIZED   Final   Special Requests NONE   Final   Culture  Setup Time     Final   Value: 11/12/2013 17:23     Performed at [place]  Final   Value: NO GROWTH     Performed at [facility]   Culture     Final   Value: NO GROWTH     Performed at [facility]   Report Status 11/13/2013 FINAL   Final  MRSA PCR SCREENING     Status: None   Collection Time    11/12/13  6:40 PM      Result Value Ref Range Status   MRSA by PCR NEGATIVE  NEGATIVE Final   Comment:            The GeneXpert MRSA Assay (FDA     approved for NASAL specimens     only), is one component of a     comprehensive MRSA colonization     surveillance program. It is not     intended to diagnose MRSA     infection nor to guide or     monitor treatment for     MRSA infections.  CULTURE, BLOOD (ROUTINE X 2)     Status: None   Collection Time    11/12/13  6:48 PM      Result Value Ref Range Status   Specimen Description BLOOD RIGHT ARM   Final    Special Requests BOTTLES DRAWN AEROBIC ONLY 5CC   Final   Culture  Setup Time     Final   Value: 11/13/2013 00:10     Performed at [facility]   Culture     Final   Value:        BLOOD CULTURE RECEIVED NO GROWTH TO DATE CULTURE WILL BE HELD FOR 5 DAYS BEFORE ISSUING A FINAL NEGATIVE REPORT     Performed at [facility]   Report Status PENDING   Incomplete  CULTURE, BLOOD (ROUTINE X 2)     Status: None   Collection Time    11/12/13  7:00 PM      Result Value Ref Range Status   Specimen Description BLOOD RIGHT HAND   Final   Special Requests BOTTLES DRAWN AEROBIC ONLY 8CC   Final   Culture  Setup Time     Final   Value: 11/13/2013 00:10     Performed at [facility]   Culture     Final   Value:        BLOOD CULTURE RECEIVED NO GROWTH TO DATE CULTURE WILL BE HELD FOR 5 DAYS BEFORE ISSUING A FINAL NEGATIVE REPORT     Performed at [facility]   Report Status PENDING   Incomplete     Labs: Basic Metabolic Panel:  Recent Labs Lab 11/12/13 0620 11/13/13 0252 11/14/13 0225 11/15/13 0735 11/16/13 0430  NA 144 144 145 143 141  K 3.8 4.4 4.5 5.1 5.2  CL 98 103 108 105 105  CO2 34* 30 27 25 24   GLUCOSE 87 113* 140* 122* 126*  BUN 33* 26* 30* 35* 42*  CREATININE 1.25* 0.99 0.93 0.90 1.15*  CALCIUM 8.6 7.7* 8.0* 9.1 8.8  MG  --   --   --  2.5  --   PHOS  --   --   --  3.6  --    Liver Function Tests: No results found for this basename: AST, ALT, ALKPHOS, BILITOT, PROT, ALBUMIN,  in the last 168 hours No results found for this basename: LIPASE, AMYLASE,  in the last 168 hours No results found for this basename: AMMONIA,  in the last 168 hours CBC:  Recent Labs Lab 11/11/13 2009  11/12/13 8295 11/13/13 0252 11/14/13 0225 11/15/13 0735 11/16/13 0430  WBC 11.7*  --  10.2 7.3 5.5 7.3 7.5  NEUTROABS 8.9*  --   --   --   --   --   --   HGB 14.6  < > 13.9 12.2 11.0* 12.5 12.0  HCT 43.0  < > 42.9 37.2 33.3* 38.2 36.9  MCV 101.9*  --  103.1*  101.6* 102.1* 102.7* 101.9*  PLT 207  --  207 168 165 195 206  < > = values in this interval not displayed. Cardiac Enzymes: No results found for this basename: CKTOTAL, CKMB, CKMBINDEX, TROPONINI,  in the last 168 hours BNP: BNP (last 3 results)  Recent Labs  08/20/13 1926 09/02/13 1145 09/19/13 1310  PROBNP 736.3* 622.9* 506.1*   CBG:  Recent Labs Lab 11/13/13 0821 11/13/13 1125 11/13/13 1554 11/13/13 1955 11/13/13 2353  GLUCAP 122* 127* 106* 157* 139*       Signed:  Mckinzi Eriksen  [facility] 11/16/2013, 3:14 PM

## 2013-11-16 NOTE — Clinical Documentation Improvement (Signed)
  Records states multiple times patient is "O2 dependent" on O2 4L at home for severe COPD and CHF. Please clarify in chart for severity of illness and risk of mortality. Thank you.  Possible Clinical Conditions? - Chronic Respiratory Failure   - Other Condition (please specify)   Thank You, Ezekiel Ina ,RN Clinical Documentation Specialist:  [phone]  [facility]

## 2013-11-16 NOTE — Progress Notes (Signed)
Received patient from 4N.  Patient placed on 4L O2 via nasal cannula as ordered; vitals stable; denies pain.  Patient oriented to room and unit.  Will continue to monitor.

## 2013-11-16 NOTE — H&P (Signed)
Physical Medicine and Rehabilitation Admission H&P  Chief Complaint   Patient presents with   .  BLE weakness with constipation and urinary retention.     HPI: [name] is a 53 y.o. female with history of severe COPD--oxygen dependent, FMS, multiple episodes of PNA this winter, , chronic thoracic pain with compression fracture who was admitted on 11/11/13 with complaints of urinary retention as well as progressive weakness in BLE with inability to walk for a week. Patient reported epidural injection approx 9 days prior to admission with onset of LE numbness in 48 hours and progressive symptoms. MRI L-spine negative for stenosis and Dr. Ronnald Ramp consulted for input. He recommended MRI T-spine which showed "Abnormal structures within the spinal canal at the T8 level, larger on the left than on the right, which appear to compress the spinal cord". She was cleared for surgery by pulmonary and was taken to OR on 11/12/13 for T8 thoracic epidural mass resection. BLE dopplers done due to LLE edema and negative for DVT. Pathology with fibroadipose tissue with fat necrosis as well as degenerative cartilaginous tissue with abundant necrosis. Extubated on 11/13/13 and therapy initiated. Patient continues with paraparesis and requires verbal as well as tactile cues for mobility. CIR recommended by rehab team and patient admitted today.    Review of Systems  HENT: Negative for hearing loss.  Eyes: Negative for blurred vision and double vision.  Respiratory: Positive for cough, shortness of breath and wheezing.  Cardiovascular: Positive for chest pain (Non-cardiac. ). Negative for palpitations.  Gastrointestinal: Positive for constipation. Negative for nausea and vomiting.  Musculoskeletal: Positive for back pain (complaining of back fullness. ).  Neurological: Positive for sensory change and focal weakness. Negative for headaches.  Psychiatric/Behavioral: Positive for depression. The patient is  nervous/anxious and has insomnia.   Past Medical History   Diagnosis  Date   .  Right-sided heart failure    .  COPD (chronic obstructive pulmonary disease)    .  Emphysema    .  Seizures    .  Thyroid disease    .  Depression    .  Emphysema    .  Arthritis      back and legs   .  Blood transfusion without reported diagnosis    .  High cholesterol    .  Anxiety    .  Nerves    .  Panic attack    .  Thyroid disorder    .  Hypothyroidism    .  Pneumonia    .  CHF (congestive heart failure)    .  On home O2      4L N/C chronic   .  Fibromyalgia     Past Surgical History   Procedure  Laterality  Date   .  Abdominal hysterectomy     .  Cesarean section     .  Cholecystectomy     .  Laser removal condyloma     .  Laminectomy  N/A  11/12/2013     Procedure: THORACIC LAMINECTOMY FOR MASS; Surgeon: Eustace Moore, MD; Location: [facility]; Service: Neurosurgery; Laterality: N/A;    Family History   Problem  Relation  Age of Onset   .  Cancer  Father     Social History: Married. Lives with family who can assist past discharge. Disabled. Independent prior to a week ago. Used to smoke 2 PPD till Dec 2014---now down to 2 ciggs/ daily. She has  never used smokeless tobacco. She reports that she does not drink alcohol or use illicit drugs.  Allergies   Allergen  Reactions   .  Tape  Itching and Rash    Medications Prior to Admission   Medication  Sig  Dispense  Refill   .  acyclovir (ZOVIRAX) 800 MG tablet  Take 800 mg by mouth 2 (two) times daily.     Marland Kitchen  albuterol (PROVENTIL) (2.5 MG/3ML) 0.083% nebulizer solution  Take 2.5 mg by nebulization 4 (four) times daily.     Marland Kitchen  ALPRAZolam (XANAX) 0.5 MG tablet  Take 0.5 mg by mouth 3 (three) times daily.     .  Calcium Carb-Cholecalciferol (CALCIUM 600 + D PO)  Take 1 tablet by mouth 2 (two) times daily.     .  Cyanocobalamin (VITAMIN B-12) 5000 MCG SUBL  Place 5,000 mcg under the tongue daily.     Marland Kitchen  diltiazem (TIAZAC) 300 MG 24 hr  capsule  Take 300 mg by mouth daily.     .  DULoxetine (CYMBALTA) 60 MG capsule  Take 60 mg by mouth 2 (two) times daily.     .  ferrous sulfate 325 (65 FE) MG EC tablet  Take 325 mg by mouth daily.     .  Fluticasone-Salmeterol (ADVAIR) 250-50 MCG/DOSE AEPB  Inhale 1 puff into the lungs every 12 (twelve) hours.     .  furosemide (LASIX) 40 MG tablet  Take 40 mg by mouth daily.     Marland Kitchen  levothyroxine (SYNTHROID, LEVOTHROID) 150 MCG tablet  Take 150 mcg by mouth daily.     .  Linaclotide (LINZESS) 145 MCG CAPS capsule  Take 145 mcg by mouth daily.     Marland Kitchen  loratadine (CLARITIN) 10 MG tablet  Take 10 mg by mouth at bedtime.     Marland Kitchen  oxyCODONE (ROXICODONE) 15 MG immediate release tablet  Take 15 mg by mouth every 6 (six) hours as needed for pain.     .  pantoprazole (PROTONIX) 40 MG tablet  Take 1 tablet (40 mg total) by mouth 2 (two) times daily.  60 tablet  2   .  potassium chloride SA (K-DUR,KLOR-CON) 20 MEQ tablet  Take 20 mEq by mouth daily.     .  pregabalin (LYRICA) 100 MG capsule  Take 100 mg by mouth 3 (three) times daily.     Marland Kitchen  tiotropium (SPIRIVA) 18 MCG inhalation capsule  Place 18 mcg into inhaler and inhale daily.     .  traZODone (DESYREL) 150 MG tablet  Take 150 mg by mouth at bedtime.      Home:  Home Living  Family/patient expects to be discharged to:: Private residence  Living Arrangements: Spouse/significant other;Children  Available Help at Discharge: Family;Available 24 hours/day  Type of Home: Mobile home  Home Access: Stairs to enter  Entrance Stairs-Number of Steps: 4  Entrance Stairs-Rails: Left  Home Layout: One level  Home Equipment: Cane - single point;Bedside commode;Shower seat;Walker - 2 wheels  Functional History:  Prior Function  Comments: 4L 02 at home  Functional Status:  Mobility:  Mod to max assist for basic transfers  Ambulation/Gait  Ambulation Distance (Feet): 2 Feet  Gait velocity: slow  General Gait Details: pre gait activities performed   ADL:    Cognition:  Cognition  Overall Cognitive Status: Within Functional Limits for tasks assessed  Orientation Level: Oriented X4  Cognition  Arousal/Alertness: Awake/alert  Behavior During Therapy: [facility] for tasks  assessed/performed  Overall Cognitive Status: Within Functional Limits for tasks assessed    Physical Exam:  Blood pressure 124/84, pulse 90, temperature 98.6 F (37 C), temperature source Oral, resp. rate 16, height $RemoveBe'5\' 2"'bOWtVgmyU$  (1.575 m), weight 73.755 kg (162 lb 9.6 oz), SpO2 91.00%.   Constitutional: She is oriented to person, place, and time. She appears well-developed and well-nourished.  HENT: dentition fair. Oral mucosa pink/moist Head: Normocephalic and atraumatic.  Eyes: Conjunctivae are normal.  Cardiovascular: Normal rate and regular rhythm. No murmur Respiratory: No respiratory distress. She has wheezes (prolonged expiratory wheezes. ).  GI: Soft. Bowel sounds are normal. She exhibits no distension. There is no tenderness.  Musculoskeletal: She exhibits no edema.  Left leg shorter than right. ?1/2 inch  Neurological: She is alert and oriented to person, place, and time.  Paraparesis with sensory deficits. DTR absent BLE. LLE tr to 1/5 HF,KE. Tr at ankle. RLE is 1+hf, 2-ke, 3 ankle. Sensation 1/2 LLE 1+ RLE. T8 Sensory level  Skin: Skin is warm and dry.  Psychiatric: She has a normal mood and affect. Her behavior is normal.    Results for orders placed during the hospital encounter of 11/11/13 (from the past 48 hour(s))   BASIC METABOLIC PANEL Status: Abnormal    Collection Time    11/15/13 7:35 AM   Result  Value  Ref Range    Sodium  143  137 - 147 mEq/L    Potassium  5.1  3.7 - 5.3 mEq/L    Chloride  105  96 - 112 mEq/L    CO2  25  19 - 32 mEq/L    Glucose, Bld  122 (*)  70 - 99 mg/dL    BUN  35 (*)  6 - 23 mg/dL    Creatinine, Ser  0.90  0.50 - 1.10 mg/dL    Calcium  9.1  8.4 - 10.5 mg/dL    GFR calc non Af Amer  73 (*)  >90 mL/min    GFR calc Af Amer  84  (*)  >90 mL/min    Comment:  (NOTE)     The eGFR has been calculated using the CKD EPI equation.     This calculation has not been validated in all clinical situations.     eGFR's persistently <90 mL/min signify possible Chronic Kidney     Disease.   CBC Status: Abnormal    Collection Time    11/15/13 7:35 AM   Result  Value  Ref Range    WBC  7.3  4.0 - 10.5 K/uL    RBC  3.72 (*)  3.87 - 5.11 MIL/uL    Hemoglobin  12.5  12.0 - 15.0 g/dL    HCT  38.2  36.0 - 46.0 %    MCV  102.7 (*)  78.0 - 100.0 fL    MCH  33.6  26.0 - 34.0 pg    MCHC  32.7  30.0 - 36.0 g/dL    RDW  16.1 (*)  11.5 - 15.5 %    Platelets  195  150 - 400 K/uL   MAGNESIUM Status: None    Collection Time    11/15/13 7:35 AM   Result  Value  Ref Range    Magnesium  2.5  1.5 - 2.5 mg/dL   PHOSPHORUS Status: None    Collection Time    11/15/13 7:35 AM   Result  Value  Ref Range    Phosphorus  3.6  2.3 - 4.6  mg/dL   BASIC METABOLIC PANEL Status: Abnormal    Collection Time    11/16/13 4:30 AM   Result  Value  Ref Range    Sodium  141  137 - 147 mEq/L    Potassium  5.2  3.7 - 5.3 mEq/L    Chloride  105  96 - 112 mEq/L    CO2  24  19 - 32 mEq/L    Glucose, Bld  126 (*)  70 - 99 mg/dL    BUN  42 (*)  6 - 23 mg/dL    Creatinine, Ser  1.15 (*)  0.50 - 1.10 mg/dL    Calcium  8.8  8.4 - 10.5 mg/dL    GFR calc non Af Amer  54 (*)  >90 mL/min    GFR calc Af Amer  63 (*)  >90 mL/min    Comment:  (NOTE)     The eGFR has been calculated using the CKD EPI equation.     This calculation has not been validated in all clinical situations.     eGFR's persistently <90 mL/min signify possible Chronic Kidney     Disease.   CBC Status: Abnormal    Collection Time    11/16/13 4:30 AM   Result  Value  Ref Range    WBC  7.5  4.0 - 10.5 K/uL    RBC  3.62 (*)  3.87 - 5.11 MIL/uL    Hemoglobin  12.0  12.0 - 15.0 g/dL    HCT  36.9  36.0 - 46.0 %    MCV  101.9 (*)  78.0 - 100.0 fL    MCH  33.1  26.0 - 34.0 pg    MCHC  32.5  30.0  - 36.0 g/dL    RDW  16.0 (*)  11.5 - 15.5 %    Platelets  206  150 - 400 K/uL    Dg Abd 2 Views  11/16/2013 CLINICAL DATA: Mid abdominal pain, constipation EXAM: ABDOMEN - 2 VIEW COMPARISON: DG ABD PORTABLE 1V dated 08/20/2013; DG ABDOMEN 2V dated 12/23/2012; CT ABD-PELV W/ CM dated 12/28/2012 FINDINGS: Large colonic stool burden without evidence of obstruction. There is moderate gaseous distention of the stomach. No pneumoperitoneum, pneumatosis or portal venous gas. Post cholecystectomy. Post ORIF of the left femoral neck. Note definitive abnormal intra-abdominal calcifications. No acute osseus abnormalities. IMPRESSION: Large colonic stool burden without evidence of obstruction. Electronically Signed By: Sandi Mariscal M.D. On: 11/16/2013 10:23   Post Admission Physician Evaluation:  1. Functional deficits secondary to T8 epidural mass s/p resection. 2. Patient is admitted to receive collaborative, interdisciplinary care between the physiatrist, rehab nursing staff, and therapy team. 3. Patient's level of medical complexity and substantial therapy needs in context of that medical necessity cannot be provided at a lesser intensity of care such as a SNF. 4. Patient has experienced substantial functional loss from his/her baseline which was documented above under the "Functional History" and "Functional Status" headings. Judging by the patient's diagnosis, physical exam, and functional history, the patient has potential for functional progress which will result in measurable gains while on inpatient rehab. These gains will be of substantial and practical use upon discharge in facilitating mobility and self-care at the household level. 5. Physiatrist will provide 24 hour management of medical needs as well as oversight of the therapy plan/treatment and provide guidance as appropriate regarding the interaction of the two. 6. 24 hour rehab nursing will assist with bladder management, bowel management, safety,  skin/wound  care, disease management, medication administration, pain management and patient education and help integrate therapy concepts, techniques,education, etc. 7. PT will assess and treat for/with: Lower extremity strength, range of motion, stamina, balance, functional mobility, safety, adaptive techniques and equipment, pain mgt, SCI education, trunk control. Goals are:mod I to supervision primarily at w/c level. 8. OT will assess and treat for/with: ADL's, functional mobility, safety, upper extremity strength, adaptive techniques and equipment, NMR, SCI education, core muscle strengthening. Goals are: mod I to min assist. 9. SLP will assess and treat for/with: n/a. Goals are: n/a. 10. Case Management and Social Worker will assess and treat for psychological issues and discharge planning. 11. Team conference will be held weekly to assess progress toward goals and to determine barriers to discharge. 12. Patient will receive at least 3 hours of therapy per day at least 5 days per week. 13. ELOS: 20-25 days  14. Prognosis: excellent   Medical Problem List and Plan:  Thoracic mass with incomplete paraplegia, s/p resection  1. DVT Prophylaxis/Anticoagulation: Mechanical: Antiembolism stockings, knee (TED hose) Bilateral lower extremities  Sequential compression devices, below knee Bilateral lower extremities Check dopplers.  2. Chronic pain due to fibromyalgia as well as back pain with neuropathy/ Pain Management: Use oxycodone 15 mg qid at home as well Lyrica 100 mg tid and cymbalta bid to augment pain (as well as mood)  3. Depression with anxiety and panic attacks/Mood: Team to provide ego support. LCSW to follow for evaluation and support. Will continue cymbalta bid, xanax 0.5 mg tid as well as trazodone at bedtime for mood and insomnia.  4.Neuropsych: This patient is capable of making decisions on her own behalf.  5. Severe COPD: Continue oxygen 4 liters. Continue spiriva and dulera.  Complaining of increase in SOB. Question component of CHF--will check CXR. Increase albuterol to qid as at home. Check sputum culture to rule out infectious cause.  6. Bowel and bladder program: Continue foley for now. On linzess but ineffective--will need augmentation of bowel program--SSE today. Add miralax daily. KUB today with large stool burden did have BM this afternoon but repeat enema as continues to have abdominal distension with inability to tolerate po's. Will re-check KUB in am. Has decreased rectal tone and may need enema daily.  7. Chronic renal failure: Baseline BUN/Cr- 39/1.80 at admission. Recheck labs in am.  8. Diastolic dysfunction with tachycardia (as compensatory for hypoxia): Resume lasix at 20 mg daily as well as cardizem for BP/ heart rate control. Low salt diet.  9. Surgical- decadron taper initiated   Meredith Staggers, MD, [place] [facility]   11/16/2013

## 2013-11-16 NOTE — Progress Notes (Signed)
Rehab admissions - Evaluated for possible admission.  I met with patient and gave her rehab brochures.  He husband is disabled but he can assist.  Patient would like to come to inpatient rehab.  Bed available and will admit to acute inpatient rehab today.  Call me for questions.  #[phone] 

## 2013-11-16 NOTE — Progress Notes (Signed)
Patient is discharged from room 4N20 at this time and transferred to room [phone]. Report called to nurse Joelene Millin. All belongings transferred with patient and verbalized understanding of transfer.

## 2013-11-16 NOTE — PMR Pre-admission (Signed)
PMR Admission Coordinator Pre-Admission Assessment  Patient: [name] is an 53 y.o., female MRN: [id number] DOB: December 03, 1960 Height: 5\' 2"  (157.5 cm) Weight: 73.755 kg (162 lb 9.6 oz)              Insurance Information HMO:      PPO:       PCP:       IPA:       80/20:       OTHER:   PRIMARY:  Medicare A/B      Policy#: [id number]      Subscriber: Laurence Ferrari CM Name:        Phone#:       Fax#:   Pre-Cert#:        Employer: Disabled Benefits:  Phone #:       Name: Checked in [place]. Date: A=02/23/07 and B=09/26/07     Deduct: $1260      Out of Pocket Max: none      Life Max: unlimited CIR: 100%      SNF: 100 days  LBD=09/22/13 Outpatient: 80%     Co-Pay: 20% Home Health: 100%      Co-Pay: none DME: 80%     Co-Pay: 20% Providers: patient's choice  SECONDARY:  Medicaid [place] [facility] access      Policy#: [id number]      Subscriber: Laurence Ferrari CM Name:        Phone#:       Fax#:   Pre-Cert#:        Employer: Disabled/Unemployed Benefits:  Phone #: [phone]     Name: Automated Eff. Date: 11/16/13 eligible     Deduct:        Out of Pocket Max:        Life Max:   CIR:        SNF:   Outpatient:       Co-Pay:   Home Health:        Co-Pay:   DME:       Co-Pay:     Emergency Contact Information Contact Information   Name Relation Home Work Mobile   Collins Spouse [phone]     Cythina, Mickelsen [phone]     Loney Laurence [phone]  [phone]     Current Medical History  Patient Admitting Diagnosis: Thoracic mass with incomplete paraplegia, s/p resection   History of Present Illness:  A 52 y.o. female with history of severe COPD--oxygen dependent, multiple episodes of PNA this winter, , chronic thoracic pain with compression fracture who was admitted on 11/11/13 with complaints of urinary retention as well as progressive weakness in BLE with inability to walk for a week. Patient reported epidural injection approx 9 days prior to admission with onset of  LE numbness in 48 hours and progressive symptoms. MRI L-spine negative for stenosis and Dr. Ronnald Ramp consulted for input. He recommended MRI T-spine which showed "Abnormal structures within the spinal canal at the T8 level, larger on the left than on the right, which appear to compress the spinal cord". She was cleared for surgery by pulmonary and was taken to OR on 11/12/13 for T8 thoracic epidural mass resection. BLE dopplers done due to LLE edema and negative for DVT. Pathology pending. Extubated on 11/13/13 and PT evaluation done yesterday. Patient continues with paraparesis and requires verbal and tactile cues for mobility. MD and rehab team recommending CIR.       Past Medical History  Past Medical History  Diagnosis Date  . Right-sided heart  failure   . COPD (chronic obstructive pulmonary disease)   . Emphysema   . Seizures   . Thyroid disease   . Depression   . Emphysema   . Arthritis     back and legs  . Blood transfusion without reported diagnosis   . High cholesterol   . Anxiety   . Nerves   . Panic attack   . Thyroid disorder   . Hypothyroidism   . Pneumonia   . CHF (congestive heart failure)   . On home O2     4L N/C chronic  . Fibromyalgia     Family History  family history includes Cancer in her father.  Prior Rehab/Hospitalizations:  Recent hospitalizations in 01/15.  Was in [facility] and rehab SNF for 1 month in the past.  Was also at [facility] for 5 days previously.  Most recently had HH PT and RN.   Current Medications  Current facility-administered medications:acetaminophen (TYLENOL) suppository 650 mg, 650 mg, Rectal, Q6H PRN, Theressa Millard, MD;  acetaminophen (TYLENOL) tablet 650 mg, 650 mg, Oral, Q6H PRN, Theressa Millard, MD, 650 mg at 11/13/13 6283;  acyclovir (ZOVIRAX) tablet 800 mg, 800 mg, Oral, BID, Rigoberto Noel, MD, 800 mg at 11/16/13 0957 albuterol (PROVENTIL) (2.5 MG/3ML) 0.083% nebulizer solution 2.5 mg, 2.5 mg, Nebulization, Q2H  PRN, Raynelle Jan, MD, 2.5 mg at 11/14/13 1930;  ALPRAZolam Duanne Moron) tablet 0.5 mg, 0.5 mg, Oral, TID, Theressa Millard, MD, 0.5 mg at 11/16/13 1002;  alum & mag hydroxide-simeth (MAALOX/MYLANTA) 200-200-20 MG/5ML suspension 30 mL, 30 mL, Oral, Q6H PRN, Theressa Millard, MD antiseptic oral rinse (BIOTENE) solution 15 mL, 15 mL, Mouth Rinse, QID, Thurnell Lose, MD, 15 mL at 11/15/13 1600;  calcium-vitamin D (OSCAL WITH D) 500-200 MG-UNIT per tablet 1 tablet, 1 tablet, Oral, BID, Theressa Millard, MD, 1 tablet at 11/16/13 0953;  chlorhexidine (PERIDEX) 0.12 % solution 15 mL, 15 mL, Mouth Rinse, BID, Rush Farmer, MD, 15 mL at 11/16/13 0751 dexamethasone (DECADRON) injection 4 mg, 4 mg, Intravenous, 4 times per day, Eustace Moore, MD, 4 mg at 11/13/13 0502;  dexamethasone (DECADRON) tablet 4 mg, 4 mg, Oral, 4 times per day, Eustace Moore, MD, 4 mg at 11/16/13 1517;  diltiazem [facility]) 24 hr capsule 300 mg, 300 mg, Oral, Daily, Theressa Millard, MD, 300 mg at 11/16/13 6160;  DULoxetine (CYMBALTA) DR capsule 60 mg, 60 mg, Oral, BID, Theressa Millard, MD, 60 mg at 11/16/13 7371 ferrous sulfate tablet 325 mg, 325 mg, Oral, Daily, Theressa Millard, MD, 325 mg at 11/16/13 0626;  HYDROmorphone (DILAUDID) injection 0.5-1 mg, 0.5-1 mg, Intravenous, Q3H PRN, Theressa Millard, MD, 1 mg at 11/13/13 2356;  levothyroxine (SYNTHROID, LEVOTHROID) tablet 150 mcg, 150 mcg, Oral, QAC breakfast, Theressa Millard, MD, 150 mcg at 11/16/13 9485 Linaclotide (LINZESS) capsule 145 mcg, 145 mcg, Oral, Daily, Theressa Millard, MD, 145 mcg at 11/16/13 0955;  loratadine (CLARITIN) 5 MG/5ML syrup 10 mg, 10 mg, Oral, QHS, Ritta Slot, NP, 10 mg at 11/14/13 2102;  menthol-cetylpyridinium (CEPACOL) lozenge 3 mg, 1 lozenge, Oral, PRN, Eustace Moore, MD;  mometasone-formoterol [facility]) 100-5 MCG/ACT inhaler 2 puff, 2 puff, Inhalation, BID, Rush Farmer, MD, 2 puff at 11/15/13 0805 nicotine (NICODERM CQ - dosed in mg/24  hours) patch 14 mg, 14 mg, Transdermal, QHS, Kristeen Miss, MD, 14 mg at 11/15/13 2212;  ondansetron (ZOFRAN) injection 4 mg, 4 mg, Intravenous, Q6H PRN,  Theressa Millard, MD;  ondansetron [facility]) injection 4 mg, 4 mg, Intravenous, Q4H PRN, Eustace Moore, MD;  ondansetron [facility]) tablet 4 mg, 4 mg, Oral, Q6H PRN, Theressa Millard, MD oxyCODONE (Oxy IR/ROXICODONE) immediate release tablet 15 mg, 15 mg, Oral, Q6H PRN, Theressa Millard, MD, 15 mg at 11/15/13 2242;  pantoprazole (PROTONIX) EC tablet 40 mg, 40 mg, Oral, BID, Rigoberto Noel, MD, 40 mg at 11/16/13 0956;  phenol (CHLORASEPTIC) mouth spray 1 spray, 1 spray, Mouth/Throat, PRN, Eustace Moore, MD;  potassium chloride SA (K-DUR,KLOR-CON) CR tablet 20 mEq, 20 mEq, Oral, Daily, Theressa Millard, MD, 20 mEq at 11/16/13 0953 pregabalin (LYRICA) capsule 100 mg, 100 mg, Oral, TID, Theressa Millard, MD, 100 mg at 11/16/13 1003;  tamsulosin (FLOMAX) capsule 0.4 mg, 0.4 mg, Oral, QPC supper, Thurnell Lose, MD, 0.4 mg at 11/15/13 1752;  tiotropium [facility]) inhalation capsule 18 mcg, 18 mcg, Inhalation, Daily, Rush Farmer, MD, 18 mcg at 11/15/13 0806;  traZODone (DESYREL) tablet 150 mg, 150 mg, Oral, QHS, Theressa Millard, MD, 150 mg at 11/15/13 2211  Patients Current Diet: Cardiac  Precautions / Restrictions Precautions Precautions: Back;Fall Precaution Booklet Issued: No Precaution Comments: reinforce precaution with pt at next visit. supply back precaution booklet to pt at next visit Restrictions Weight Bearing Restrictions: No   Prior Activity Level Community (5-7x/wk): Went out 3-4 X a week to MD appointment, to pay bills.   Home Assistive Devices / Equipment Home Assistive Devices/Equipment: Oxygen;Raised toilet seat with rails;Shower chair with back Home Equipment: Kasandra Knudsen - single point;Bedside commode;Shower seat;Walker - 2 wheels  Prior Functional Level Prior Function Level of Independence: Independent with assistive  device(s) Comments: 4L 02 at home  Current Functional Level Cognition  Overall Cognitive Status: Within Functional Limits for tasks assessed Orientation Level: Oriented X4    Extremity Assessment (includes Sensation/Coordination)    Upper Extremity Assessment  Upper Extremity Assessment: Overall WFL for tasks assessed  Lower Extremity Assessment  Lower Extremity Assessment: RLE deficits/detail;LLE deficits/detail  RLE Deficits / Details: weak; decrease proprioception; intact sensation  RLE Sensation: decreased proprioception (reports numbness in feet)  RLE Coordination: decreased gross motor  LLE Deficits / Details: weak; decrease proprioception; intact sensation  LLE Sensation: decreased proprioception (reports numbness in feet)  LLE Coordination: decreased gross motor  Cervical / Trunk Assessment  Cervical / Trunk Assessment: Kyphotic     ADLs   Anticipate ADL needs.   Mobility  Overal bed mobility: Needs Assistance Bed Mobility: Rolling;Sidelying to Sit Rolling: Min assist Sidelying to sit: Mod assist General bed mobility comments: verbal cues for safety, hand placement and precautions    Transfers  Overall transfer level: Needs assistance Equipment used: 2 person hand held assist Transfers: Sit to/from Stand;Stand Pivot Transfers Sit to Stand: +2 physical assistance;Max assist Stand pivot transfers: +2 physical assistance;Max assist General transfer comment: dec proprioception and awareness of foot placement on floor. verbal and tactile cues for step sequencing and posture correction. 2 person max A blocking Bilat knees due to buckling. L LE more involved than R    Ambulation / Gait / Stairs / Wheelchair Mobility  Ambulation/Gait Ambulation/Gait assistance: +2 physical assistance;Max assist Ambulation Distance (Feet): 2 Feet Assistive device: 2 person hand held assist Gait Pattern/deviations: Step-to pattern;Decreased stride length;Shuffle;Steppage;Decreased weight  shift to right;Decreased weight shift to left;Narrow base of support;Trunk flexed Gait velocity: slow General Gait Details: pre gait activities performed    Posture / Balance Dynamic Sitting Balance Sitting balance -  Comments: sat EOB for 10 min req UE support.     Special needs/care consideration BiPAP/CPAP Yes, CPAP at home and in hospital CPM No Continuous Drip IV No  Dialysis No        Life Vest No Oxygen Yes, on O2 4L [place] at home and in hospital Special Bed No Trach Size No Wound Vac (area) No      Skin Back incision at T8 after epidural mass resection                           Bowel mgmt: Had BM 11/16/13, was continent Bladder mgmt:  Foley catheter in place Diabetic mgmt No    Previous Home Environment Living Arrangements: Spouse/significant other;Children Available Help at Discharge: Family;Available 24 hours/day Type of Home: Mobile home Home Layout: One level Home Access: Stairs to enter Entrance Stairs-Rails: Left Entrance Stairs-Number of Steps: 4 Home Care Services: No  Discharge Living Setting Plans for Discharge Living Setting: Patient's home;Lives with (comment);Mobile Home (Lives with husband, son and son's fiance.) Type of Home at Discharge: Mobile home (Single wide mobile home.) Discharge Home Layout: One level Discharge Home Access: Stairs to enter Entrance Stairs-Number of Steps: 5 Does the patient have any problems obtaining your medications?: No  Social/Family/Support Systems Patient Roles: Spouse;Parent (Has a son, Ronnie.) Contact Information: Lahoma Rocker - spouse Anticipated Caregiver: Husband Anticipated Caregiver's Contact Information: Ron [phone] Ability/Limitations of Caregiver: Husband on disability from COPD/Emphysema, but can assist. Caregiver Availability: 24/7 Discharge Plan Discussed with Primary Caregiver: Yes Is Caregiver In Agreement with Plan?: Yes Does Caregiver/Family have Issues with Lodging/Transportation while Pt is in  Rehab?: No  Goals/Additional Needs Patient/Family Goal for Rehab: PT mod I/S, OT min A, no ST needs Expected length of stay: 20-24 days Cultural Considerations: None Dietary Needs: Heart, thin liquids Equipment Needs: TBD Additional Information: Son works.  Fiance is a "High risk" pregnancy and is not currently working. Pt/Family Agrees to Admission and willing to participate: Yes Program Orientation Provided & Reviewed with Pt/Caregiver Including Roles  & Responsibilities: Yes   Decrease burden of Care through IP rehab admission: N/A  Possible need for SNF placement upon discharge: Not planned   Patient Condition: This patient's condition remains as documented in the consult dated 11/16/13, in which the Rehabilitation Physician determined and documented that the patient's condition is appropriate for intensive rehabilitative care in an inpatient rehabilitation facility. Will admit to inpatient rehab today.  Preadmission Screen Completed By:  Retta Diones, 11/16/2013 12:13 PM ______________________________________________________________________   Discussed status with Dr. Naaman Plummer on 11/16/13 at 1209 and received telephone approval for admission today.  Admission Coordinator:  Retta Diones, time1209/Date03/25/15

## 2013-11-16 NOTE — Progress Notes (Signed)
Physical Therapy Treatment Patient Details Name: TAKIMA ENCINA MRN: [id number] DOB: Nov 01, 1960 Today's Date: 2013/12/11    History of Present Illness s/p T8 laminectomy with resection of epidural mass    PT Comments    Session focused on therapeutic pre gait activities, facilitated and assisted static standing x4.  Follow Up Recommendations  CIR;Supervision/Assistance - 24 hour     Equipment Recommendations  Rolling walker with 5" wheels    Recommendations for Other Services Rehab consult     Precautions / Restrictions Precautions Precautions: Back;Fall Precaution Booklet Issued: No Precaution Comments: reinforce precaution with pt at next visit. supply back precaution booklet to pt at next visit Restrictions Weight Bearing Restrictions: No    Mobility  Bed Mobility Overal bed mobility: Needs Assistance Bed Mobility: Rolling;Sidelying to Sit Rolling: Min assist Sidelying to sit: Mod assist       General bed mobility comments: verbal cues for safety, hand placement and precautions  Transfers Overall transfer level: Needs assistance Equipment used: 2 person hand held assist Transfers: Sit to/from Stand Sit to Stand: +2 physical assistance;Max assist         General transfer comment: Performed as ther ex x4 with static standing +2 assist with LE blocking for 10 seconds each time  Ambulation/Gait             General Gait Details: pre gait activities performed   Stairs            Wheelchair Mobility    Modified Rankin (Stroke Patients Only)       Balance     Sitting balance-Leahy Scale: Poor Sitting balance - Comments: sat EOB for 10 min req UE support.      Standing balance-Leahy Scale: Zero                      Cognition Arousal/Alertness: Awake/alert Behavior During Therapy: WFL for tasks assessed/performed Overall Cognitive Status: Within Functional Limits for tasks assessed                      Exercises       General Comments        Pertinent Vitals/Pain 5/10 pain back SpO2 >92 % on 4liters    Home Living                      Prior Function            PT Goals (current goals can now be found in the care plan section) Acute Rehab PT Goals Patient Stated Goal: return home PT Goal Formulation: With patient Time For Goal Achievement: 11/21/13 Potential to Achieve Goals: Good Progress towards PT goals: Progressing toward goals    Frequency  Min 5X/week    PT Plan Current plan remains appropriate    End of Session Equipment Utilized During Treatment: Gait belt;Oxygen (4L of 02) Activity Tolerance: Patient limited by fatigue;Patient limited by pain Patient left: in bed;with call bell/phone within reach;with bed alarm set     Time: 8563-1497 PT Time Calculation (min): 20 min  Charges:  $Therapeutic Activity: 8-22 mins                    G CodesDuncan Dull 12-11-2013, 4:31 PM Alben Deeds, [place] DPT  [phone]

## 2013-11-17 ENCOUNTER — Inpatient Hospital Stay (HOSPITAL_COMMUNITY): Payer: Medicare Other | Admitting: Occupational Therapy

## 2013-11-17 ENCOUNTER — Inpatient Hospital Stay (HOSPITAL_COMMUNITY): Payer: Medicare Other | Admitting: Physical Therapy

## 2013-11-17 ENCOUNTER — Inpatient Hospital Stay (HOSPITAL_COMMUNITY): Payer: Medicare Other

## 2013-11-17 DIAGNOSIS — J449 Chronic obstructive pulmonary disease, unspecified: Secondary | ICD-10-CM

## 2013-11-17 DIAGNOSIS — N189 Chronic kidney disease, unspecified: Secondary | ICD-10-CM

## 2013-11-17 DIAGNOSIS — D497 Neoplasm of unspecified behavior of endocrine glands and other parts of nervous system: Secondary | ICD-10-CM

## 2013-11-17 DIAGNOSIS — M79609 Pain in unspecified limb: Secondary | ICD-10-CM

## 2013-11-17 DIAGNOSIS — IMO0001 Reserved for inherently not codable concepts without codable children: Secondary | ICD-10-CM

## 2013-11-17 DIAGNOSIS — F341 Dysthymic disorder: Secondary | ICD-10-CM

## 2013-11-17 LAB — CBC WITH DIFFERENTIAL/PLATELET
BASOS ABS: 0 10*3/uL (ref 0.0–0.1)
Basophils Relative: 0 % (ref 0–1)
EOS PCT: 0 % (ref 0–5)
Eosinophils Absolute: 0 10*3/uL (ref 0.0–0.7)
HEMATOCRIT: 37.2 % (ref 36.0–46.0)
Hemoglobin: 12.2 g/dL (ref 12.0–15.0)
Lymphocytes Relative: 8 % — ABNORMAL LOW (ref 12–46)
Lymphs Abs: 0.6 10*3/uL — ABNORMAL LOW (ref 0.7–4.0)
MCH: 33.1 pg (ref 26.0–34.0)
MCHC: 32.8 g/dL (ref 30.0–36.0)
MCV: 100.8 fL — AB (ref 78.0–100.0)
MONO ABS: 0.4 10*3/uL (ref 0.1–1.0)
Monocytes Relative: 6 % (ref 3–12)
Neutro Abs: 6.1 10*3/uL (ref 1.7–7.7)
Neutrophils Relative %: 86 % — ABNORMAL HIGH (ref 43–77)
Platelets: 202 10*3/uL (ref 150–400)
RBC: 3.69 MIL/uL — ABNORMAL LOW (ref 3.87–5.11)
RDW: 15.6 % — ABNORMAL HIGH (ref 11.5–15.5)
WBC: 7.1 10*3/uL (ref 4.0–10.5)

## 2013-11-17 LAB — COMPREHENSIVE METABOLIC PANEL
ALBUMIN: 2.8 g/dL — AB (ref 3.5–5.2)
ALK PHOS: 101 U/L (ref 39–117)
ALT: 28 U/L (ref 0–35)
AST: 11 U/L (ref 0–37)
BUN: 42 mg/dL — AB (ref 6–23)
CO2: 27 mEq/L (ref 19–32)
Calcium: 8.9 mg/dL (ref 8.4–10.5)
Chloride: 106 mEq/L (ref 96–112)
Creatinine, Ser: 1.05 mg/dL (ref 0.50–1.10)
GFR calc Af Amer: 70 mL/min — ABNORMAL LOW (ref >90)
GFR calc non Af Amer: 60 mL/min — ABNORMAL LOW (ref >90)
Glucose, Bld: 146 mg/dL — ABNORMAL HIGH (ref 70–99)
Potassium: 4.7 mEq/L (ref 3.7–5.3)
Sodium: 144 mEq/L (ref 137–147)
TOTAL PROTEIN: 5.6 g/dL — AB (ref 6.0–8.3)
Total Bilirubin: 0.2 mg/dL — ABNORMAL LOW (ref 0.3–1.2)

## 2013-11-17 NOTE — Progress Notes (Signed)
Inpatient Rehabilitation Center Individual Statement of Services  Patient Name:  [name]  Date:  11/17/2013  Welcome to the [facility].  Our goal is to provide you with an individualized program based on your diagnosis and situation, designed to meet your specific needs.  With this comprehensive rehabilitation program, you will be expected to participate in at least 3 hours of rehabilitation therapies Monday-Friday, with modified therapy programming on the weekends.  Your rehabilitation program will include the following services:  Physical Therapy (PT), Occupational Therapy (OT), 24 hour per day rehabilitation nursing, Therapeutic Recreation (TR), Neuropsychology, Case Management (Social Worker), Rehabilitation Medicine, Nutrition Services and Pharmacy Services  Weekly team conferences will be held on Tuesdays to discuss your progress.  Your Social Worker will talk with you frequently to get your input and to update you on team discussions.  Team conferences with you and your family in attendance may also be held.  Expected length of stay: 3 to 4 weeks  Overall anticipated outcome: Minimal assistance; some supervision; and some modified independence at w/c level  Depending on your progress and recovery, your program may change. Your Social Worker will coordinate services and will keep you informed of any changes. Your Social Worker's name and contact numbers are listed  below.  The following services may also be recommended but are not provided by the [facility] will be made to provide these services after discharge if needed.  Arrangements include referral to agencies that provide these services.  Your insurance has been verified to be:  Medicare/Medicaid Your primary doctor is:  Dr. Heide Scales  Pertinent information will be  shared with your doctor and your insurance company.  Social Worker:  Alfonse Alpers, LCSW  [phone] or [phone]  Information discussed with and copy given to patient by: Trey Sailors, 11/17/2013, 11:11 PM

## 2013-11-17 NOTE — Progress Notes (Signed)
[facility] PHYSICAL MEDICINE & REHABILITATION     PROGRESS NOTE    Subjective/Complaints: Had a reasonable night. Happy that she moved bowels "feel better". Ready for therapies A 12 point review of systems has been performed and if not noted above is otherwise negative.   Objective: Vital Signs: Blood pressure 117/70, pulse 92, temperature 98.1 F (36.7 C), temperature source Oral, resp. rate 17, height 5\' 2"  (1.575 m), weight 72.2 kg (159 lb 2.8 oz), SpO2 87.00%. Dg Chest 2 View  11/16/2013   CLINICAL DATA:  Shortness of breath  EXAM: CHEST  2 VIEW  COMPARISON:  11/12/2013  FINDINGS: The endotracheal tube is been removed as has the nasogastric catheter. The cardiac shadow is stable. The lungs are well aerated with very minimal right basilar atelectasis. The previously seen effusions are improved in the interval. Chronic compression deformity is noted within the mid thoracic spine.  IMPRESSION: Right basilar atelectasis.   Electronically Signed   By: Inez Catalina M.D.   On: 11/16/2013 19:29   Dg Abd 1 View  11/16/2013   CLINICAL DATA:  Abdominal pain  EXAM: ABDOMEN - 1 VIEW  COMPARISON:  11/16/2013 925 hrs  FINDINGS: Scattered large and small bowel gas is again identified. Fecal material is again noted throughout the colon. No free air is seen. Postsurgical changes in the left hip are again noted. No new focal abnormality is seen.  IMPRESSION: No change from the prior exam.   Electronically Signed   By: Inez Catalina M.D.   On: 11/16/2013 19:30   Dg Abd 2 Views  11/16/2013   CLINICAL DATA:  Mid abdominal pain, constipation  EXAM: ABDOMEN - 2 VIEW  COMPARISON:  DG ABD PORTABLE 1V dated 08/20/2013; DG ABDOMEN 2V dated 12/23/2012; CT ABD-PELV W/ CM dated 12/28/2012  FINDINGS: Large colonic stool burden without evidence of obstruction. There is moderate gaseous distention of the stomach. No pneumoperitoneum, pneumatosis or portal venous gas.  Post cholecystectomy.  Post ORIF of the left femoral neck.   Note definitive abnormal intra-abdominal calcifications.  No acute osseus abnormalities.  IMPRESSION: Large colonic stool burden without evidence of obstruction.   Electronically Signed   By: Sandi Mariscal M.D.   On: 11/16/2013 10:23    Recent Labs  11/16/13 0430 11/17/13 0600  WBC 7.5 7.1  HGB 12.0 12.2  HCT 36.9 37.2  PLT 206 202    Recent Labs  11/16/13 0430 11/17/13 0600  NA 141 144  K 5.2 4.7  CL 105 106  GLUCOSE 126* 146*  BUN 42* 42*  CREATININE 1.15* 1.05  CALCIUM 8.8 8.9   CBG (last 3)  No results found for this basename: GLUCAP,  in the last 72 hours  Wt Readings from Last 3 Encounters:  11/16/13 72.2 kg (159 lb 2.8 oz)  11/14/13 73.755 kg (162 lb 9.6 oz)  11/14/13 73.755 kg (162 lb 9.6 oz)    Physical Exam:  Constitutional: She is oriented to person, place, and time. She appears well-developed and well-nourished.  HENT: dentition fair. Oral mucosa pink/moist  Head: Normocephalic and atraumatic.  Eyes: Conjunctivae are normal.  Cardiovascular: Normal rate and regular rhythm. No murmur  Respiratory: No respiratory distress. She has wheezes (prolonged expiratory wheezes. ).  GI: Soft. Bowel sounds are normal. She exhibits no distension. There is no tenderness.  Musculoskeletal: She exhibits no edema.  Left leg shorter than right. ?1/2 inch  Neurological: She is alert and oriented to person, place, and time.  Paraparesis with sensory deficits.  DTR absent BLE. LLE tr to 1/5 HF,KE. Tr at ankle. RLE is 1+hf, 2-ke, 3 ankle. Sensation 1/2 LLE 1+ RLE. T8 Sensory level  Skin: Skin is warm and dry.  Psychiatric: She has a normal mood and affect. Her behavior is normal.    Assessment/Plan: 1. 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 Physiatrist is providing close team supervision and 24 hour management of active  medical problems listed below. Physiatrist and rehab team continue to assess barriers to discharge/monitor patient progress toward functional and medical goals. FIM:                                 Medical Problem List and Plan:  Thoracic mass with incomplete paraplegia, s/p resection  1. DVT Prophylaxis/Anticoagulation: Mechanical: Antiembolism stockings, knee (TED hose) Bilateral lower extremities  Sequential compression devices, below knee Bilateral lower extremities  -dopplers today.  2. Chronic pain due to fibromyalgia as well as back pain with neuropathy/ Pain Management: Use oxycodone 15 mg qid at home as well Lyrica 100 mg tid and cymbalta bid    -follow for activity tolerance 3. Depression with anxiety and panic attacks/Mood: Team to provide ego support. LCSW to follow for evaluation and support. Will continue cymbalta bid, xanax 0.5 mg tid as well as trazodone at bedtime for mood and insomnia.  4.Neuropsych: This patient is capable of making decisions on her own behalf.  5. Severe COPD: Continue oxygen 4 liters--used at home. Continue spiriva and dulera. Complaining of increase in SOB.  -albuterol to qid as at home.   -sputum culture to rule out infectious cause.   -cxr with atelectasis---discussed with the patient the importance of IS 6. Bowel and bladder program: Continue foley for now. On linzess but ineffective--will need augmentation of bowel program--SSE today. Add miralax daily. Had good bm today  -follow up kub  -regular program 7. Chronic renal failure: Baseline BUN/Cr- 39/1.80 at admission. Near baseline---continue current meds  -recheck Friday 8. Diastolic dysfunction with tachycardia (as compensatory for hypoxia): Resumed lasix at 20 mg daily as well as cardizem for BP/ heart rate control. Low salt diet.  9. Surgical- decadron taper initiated   LOS (Days) 1 A FACE TO FACE EVALUATION WAS PERFORMED  SWARTZ,ZACHARY T 11/17/2013 8:51 AM

## 2013-11-17 NOTE — Progress Notes (Signed)
Social Work Assessment and Plan  Patient Details  Name: [name] MRN: [id number] Date of Birth: 01-22-1961  Today's Date: 11/17/2013  Problem List:  Patient Active Problem List   Diagnosis Date Noted  . Thoracic spine tumor 11/16/2013  . Urinary retention 11/12/2013  . Left leg weakness 11/12/2013  . 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 Dyspnea 10/18/2013  . Right upper lobe pneumonia/infiltrate 09/20/2013  . Lichen planus vulva 06/18/8526  . Thoracic compression fracture 09/20/2013  . COPD exacerbation 09/19/2013  . Acute delirium 09/07/2013  . Chronic diastolic congestive heart failure, NYHA class 1 09/07/2013  . Hypernatremia 09/06/2013  . Shingles 09/06/2013  . History of depression, chronic antidepressants 09/06/2013  . Physical deconditioning 09/06/2013  . Hypercarbia, acute (resolved) on chronic 09/02/2013  . Acute-on-chronic respiratory failure 09/02/2013  . 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 HYPERTRIGLYCERIDEMIA 09/16/2006  . History of anxiety - chronic alprazolam 06/10/2006  . TOBACCO ABUSE 06/10/2006  . COPD without acute bronchospasm 06/10/2006  . PULMONARY NODULE 06/10/2006  . GERD, chronic PPI use 06/10/2006  . LOW BACK PAIN 06/10/2006  . FIBROMYALGIA 06/10/2006  . SLEEP APNEA 06/10/2006  . CERVICAL CANCER, HX OF 06/10/2006  . HYSTERECTOMY, HX OF 06/10/2006   Past Medical History:  Past Medical History  Diagnosis Date  . Right-sided heart failure   . COPD (chronic obstructive pulmonary disease)   . Emphysema   . Seizures   . Thyroid disease   . Depression   . Emphysema   . Arthritis     back and legs  . Blood transfusion without reported diagnosis   . High cholesterol   . Anxiety   . Nerves   . Panic attack   . Thyroid disorder   . Hypothyroidism   . Pneumonia   . CHF (congestive heart failure)   . On home O2     4L N/C chronic  . Fibromyalgia    Past Surgical History:  Past Surgical  History  Procedure Laterality Date  . Abdominal hysterectomy    . Cesarean section    . Cholecystectomy    . Laser removal condyloma    . Laminectomy N/A 11/12/2013    Procedure: THORACIC LAMINECTOMY FOR MASS;  Surgeon: Eustace Moore, MD;  Location: [facility];  Service: Neurosurgery;  Laterality: N/A;   Social History:  reports that she quit smoking about 3 months ago. She has never used smokeless tobacco. She reports that she does not drink alcohol or use illicit drugs.  Family / Support Systems Marital Status: Married How Long?: 28 years Patient Roles: Spouse;Parent;Other (Comment) (sister) Spouse/Significant Other: Delphine Sizemore - husband  [phone] Children: Jenene Kauffmann - son  [phone] Other Supports: Ancil Linsey - sister  [phone] Anticipated Caregiver: Husband Ability/Limitations of Caregiver: Husband on disability from COPD/Emphysema, but can assist. Caregiver Availability: 24/7 Family Dynamics: Close, supportive family  Social History Preferred language: English Religion: Baptist Read: Yes Write: Yes Employment Status: Disabled Date Retired/Disabled/Unemployed: 8+ years ago Public relations account executive Issues: None reported Guardian/Conservator: None   Abuse/Neglect Physical Abuse: Denies Verbal Abuse: Denies Sexual Abuse: Denies Self-Neglect: Denies  Emotional Status Pt's affect, behavior and adjustment status: Pt is coping appropriately and feels positive about her rehabilitation.  Pt was conversant with CSW and gave good eye contact. Recent Psychosocial Issues: None reported Pyschiatric History: Depression and anxiety noted in H&P Substance Abuse History: None reported  Patient / Family Perceptions, Expectations & Goals Pt/Family understanding of illness &  functional limitations: Pt/family feel their questions have been answered and they have a good understanding of pt's condition. Premorbid pt/family roles/activities: Pt liked to do  things with her husband and family.  She enjoys her grandson and looks forward to the birth of her granddaughter in August.  Pt likes to complete puzzles/play games. Anticipated changes in roles/activities/participation: Pt's husband can help with things she would normally do around the house. Pt/family expectations/goals: Pt wants "to get back to walking and get my back feeling better."  Community Resources Premorbid Home Care/DME Agencies: Other (Comment) ([facility] for PT/OT/RN and [facility] for home O2) Transportation available at discharge: husband Resource referrals recommended: Neuropsychology;Support group (specify)  Discharge Planning Living Arrangements: Spouse/significant other;Children Support Systems: Spouse/significant other;Children;Other relatives Type of Residence: Private residence Insurance Resources: Medicare;Medicaid (specify county) Sports coach) Financial Resources: SSD Financial Screen Referred: No Living Expenses: Higher education careers adviser Management: Patient;Spouse Does the patient have any problems obtaining your medications?: No Home Management: Pt's husband will be able to do this while pt is recovering.  He is working on having a ramp built at home. Patient/Family Preliminary Plans: Pt plans to return home to ther care of her husband and son/his fiance. Barriers to Discharge: Steps Social Work Anticipated Follow Up Needs: HH/OP Expected length of stay: 20-24 days  Clinical Impression CSW met with pt and her husband, sister, and brother-in-law to introduce self and role of CSW, as well as complete assessment.  Pt was open to have family present during [place] visit.  Pt is upbeat and motivated to get better.  Pt is on chronic O2 at home through [facility] and her husband is on it, as well.  Pt will have good support from family, though, and husband states he is physically capable of assisting pt.  Pt is pleased to have the opportunity to rehab at [facility], as in the past, she has  been to SNFs, even as recently as this year.  CSW will continue to follow pt and assist pt/family throughout her stay and with d/c needs.    Ahan Eisenberger, Silvestre Mesi 11/17/2013, 11:30 PM

## 2013-11-17 NOTE — Progress Notes (Signed)
Patient information reviewed and entered into eRehab system by Waller Marcussen, RN, CRRN, PPS Coordinator.  Information including medical coding and functional independence measure will be reviewed and updated through discharge.     Per nursing patient was given "Data Collection Information Summary for Patients in Inpatient Rehabilitation Facilities with attached "Privacy Act Statement-Health Care Records" upon admission.  

## 2013-11-17 NOTE — Progress Notes (Signed)
Occupational Therapy Session Note  Patient Details  Name: [name] MRN: [id number] Date of Birth: 06-25-1961  Today's Date: 11/17/2013 Time: 1330-1400 Time Calculation (min): 30 min  Short Term Goals: Week 1:  OT Short Term Goal 1 (Week 1): Patient will complete lower body bathing using AE, prn, supine in bed with min A OT Short Term Goal 2 (Week 1): Patient will complete upper body bathing at sink or at edge of bed with setup assist OT Short Term Goal 3 (Week 1): Patient will complete dressing using AE prn, sitting and standing, with mod assist OT Short Term Goal 4 (Week 1): Patient will maintain post-surgical safety precautions 100% of the time during ADL.  Skilled Therapeutic Interventions/Progress Updates:  Engaged in DME and AE instruction with focus on LB dressing at EOB.  Pt in bed upon arrival, performed supine to sit with mod-max assist with heavy reliance on bed rails and assist to advance BLE off EOB.  Pt with decreased balance reactions in sitting.  Doffed Rt sock in sitting with crossing RLE over opposite knee, assist for trunk control.  Donned underwear in sitting with crossing RLE over opposite knee and education on use of reacher for donning LLE due to increased weakness in LLE and to decrease bending.  Pt required assist with threading Lt leg of underwear due to gripper socks.  +2 assist for sit >stand with blocking at bilateral knees to maintain partial stand while pulling underwear over hips.  Pt returned to bed to rest until PT session, RN present to provide pain meds.  Therapy Documentation Precautions:  Precautions Precautions: Back;Fall Precaution Comments: Back precautions reviewed (BAT) Restrictions Weight Bearing Restrictions: No General:   Vital Signs: Oxygen Therapy SpO2: 91 % O2 Device: Nasal cannula O2 Flow Rate (L/min): 4 L/min Pain:  Pt with c/o pain in Rt side of back, RN notified.  See FIM for current functional status  Therapy/Group:  Individual Therapy  Simonne Come 11/17/2013, 2:12 PM

## 2013-11-17 NOTE — Progress Notes (Signed)
*  PRELIMINARY RESULTS* Vascular Ultrasound Lower extremity venous duplex has been completed.  Preliminary findings: No evidence of DVT.   Landry Mellow, RDMS, RVT  11/17/2013, 11:20 AM

## 2013-11-17 NOTE — Evaluation (Signed)
Occupational Therapy Assessment and Plan  Patient Details  Name: [name] MRN: [id number] Date of Birth: [birth year]  OT Diagnosis: paraparesis, muscle weakness Rehab Potential: Rehab Potential: Good ELOS: 14-21 days   Today's Date: 11/17/2013 Time: 0730-0830 Time Calculation (min): 60 min  Problem List:  Patient Active Problem List   Diagnosis Date Noted  . Thoracic spine tumor 11/16/2013  . Urinary retention 11/12/2013  . Left leg weakness 11/12/2013  . 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 Dyspnea 10/18/2013  . Right upper lobe pneumonia/infiltrate 09/20/2013  . Lichen planus vulva 33/35/4562  . Thoracic compression fracture 09/20/2013  . COPD exacerbation 09/19/2013  . Acute delirium 09/07/2013  . Chronic diastolic congestive heart failure, NYHA class 1 09/07/2013  . Hypernatremia 09/06/2013  . Shingles 09/06/2013  . History of depression, chronic antidepressants 09/06/2013  . Physical deconditioning 09/06/2013  . Hypercarbia, acute (resolved) on chronic 09/02/2013  . Acute-on-chronic respiratory failure 09/02/2013  . 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 HYPERTRIGLYCERIDEMIA 09/16/2006  . History of anxiety - chronic alprazolam 06/10/2006  . TOBACCO ABUSE 06/10/2006  . COPD without acute bronchospasm 06/10/2006  . PULMONARY NODULE 06/10/2006  . GERD, chronic PPI use 06/10/2006  . LOW BACK PAIN 06/10/2006  . FIBROMYALGIA 06/10/2006  . SLEEP APNEA 06/10/2006  . CERVICAL CANCER, HX OF 06/10/2006  . HYSTERECTOMY, HX OF 06/10/2006    Past Medical History:  Past Medical History  Diagnosis Date  . Right-sided heart failure   . COPD (chronic obstructive pulmonary disease)   . Emphysema   . Seizures   . Thyroid disease   . Depression   . Emphysema   . Arthritis     back and legs  . Blood transfusion without reported diagnosis   . High cholesterol   . Anxiety   . Nerves   . Panic attack   . Thyroid disorder    . Hypothyroidism   . Pneumonia   . CHF (congestive heart failure)   . On home O2     4L N/C chronic  . Fibromyalgia    Past Surgical History:  Past Surgical History  Procedure Laterality Date  . Abdominal hysterectomy    . Cesarean section    . Cholecystectomy    . Laser removal condyloma    . Laminectomy N/A 11/12/2013    Procedure: THORACIC LAMINECTOMY FOR MASS;  Surgeon: Eustace Moore, MD;  Location: [facility];  Service: Neurosurgery;  Laterality: N/A;    Assessment & Plan Clinical Impression: Patient is a 53 y.o. year old female with history of severe COPD--oxygen dependent, multiple episodes of PNA this winter, , chronic thoracic pain with compression fracture who was admitted on 11/11/13 with complaints of urinary retention as well as progressive weakness in BLE with inability to walk for a week. Patient reported epidural injection approx 9 days prior to admission with onset of LE numbness in 48 hours and progressive symptoms. MRI L-spine negative for stenosis and Dr. Ronnald Ramp consulted for input. He recommended MRI T-spine which showed "Abnormal structures within the spinal canal at the T8 level, larger on the left than on the right, which appear to compress the spinal cord". She was cleared for surgery by pulmonary and was taken to OR on 11/12/13 for T8 thoracic epidural mass resection. BLE dopplers done due to LLE edema and negative for DVT. Pathology pending. Extubated on 11/13/13 and PT evaluation done yesterday. Patient continues with paraparesis and requires verbal and tactile cues for  mobility. MD and rehab team recommending CIR.   Patient transferred to CIR on 11/16/2013 .    Patient currently requires max with basic self-care skills secondary to muscle weakness.  Prior to hospitalization, patient could complete BADL/iADL with modified independent .  Patient will benefit from skilled intervention to increase independence with basic self-care skills and increase level of  independence with iADL prior to discharge home with care partner.  Anticipate patient will require minimal physical assistance and follow up home health.  OT - End of Session Activity Tolerance: Tolerates 10 - 20 min activity with multiple rests Endurance Deficit: Yes OT Assessment Rehab Potential: Good OT Patient demonstrates impairments in the following area(s): Balance;Endurance;Safety OT Basic ADL's Functional Problem(s): Bathing;Dressing;Toileting OT Advanced ADL's Functional Problem(s): Simple Meal Preparation OT Transfers Functional Problem(s): Toilet;Tub/Shower OT Additional Impairment(s): None OT Plan OT Intensity: Minimum of 1-2 x/day, 45 to 90 minutes OT Frequency: 5 out of 7 days OT Duration/Estimated Length of Stay: 10-14 days OT Treatment/Interventions: Balance/vestibular training;Self Care/advanced ADL retraining;UE/LE Coordination activities;Wheelchair propulsion/positioning;Therapeutic Activities;Therapeutic Exercise;UE/LE Strength taining/ROM;Patient/family education;Functional mobility training;Discharge planning;DME/adaptive equipment instruction OT Self Feeding Anticipated Outcome(s): Mod I OT Basic Self-Care Anticipated Outcome(s): Supervision OT Toileting Anticipated Outcome(s): Mod I OT Bathroom Transfers Anticipated Outcome(s): Supervision OT Recommendation Patient destination: Home Follow Up Recommendations: Home health OT Equipment Recommended: Sliding board;Wheelchair (measurements)  Skilled Therapeutic Intervention 1:1 initial OT evaluation completed with treatment emphasizing energy conservation and use of AE.   Patient instructed on use of leg lifter, slide board, and reacher this session.  Patient able to completed assisted bathing supine in bed using AE and transfer from bed to w/c with mod assist using slide board but she required numerous rest breaks with verbal cues to rest due to being highly motivated to participate in self-care.  OT  Evaluation Precautions/Restrictions  Precautions Precautions: Back;Fall Precaution Comments: Back precautions reviewed (BAT) Restrictions Weight Bearing Restrictions: No  General Chart Reviewed: Yes Family/Caregiver Present: No  Vital Signs Therapy Vitals Temp: 98.1 F (36.7 C) Temp src: Oral Pulse Rate: 92 Resp: 17 BP: 117/70 mmHg Oxygen Therapy SpO2: 87 % (94% during treatment) O2 Device: Nasal cannula O2 Flow Rate (L/min): 4 L/min  Pain Pain Assessment Pain Assessment: 0-10 Pain Score: 7  Pain Location: Shoulder Pain Orientation: Left;Posterior Pain Intervention(s): Medication (See eMAR)  Home Living/Prior Functioning Home Living Available Help at Discharge: Family;Available 24 hours/day Type of Home: Mobile home Home Access: Stairs to enter Entrance Stairs-Number of Steps: 4 Entrance Stairs-Rails: Left Home Layout: One level Additional Comments: hand-held,  IADL History Homemaking Responsibilities: Yes Meal Prep Responsibility: Primary Laundry Responsibility: Primary Cleaning Responsibility: Secondary Bill Paying/Finance Responsibility: Secondary Shopping Responsibility: Secondary Child Care Responsibility: No Homemaking Comments: did more before multiple episodes of PNA in December Current License: Yes Mode of Transportation: Car Education: 11th grade Occupation: On disability Type of Occupation: Investment banker, corporate Leisure and Hobbies: word-puzzles, loop rugs, Bible study Prior Function Level of Independence: Independent with basic ADLs  Able to Take Stairs?: Yes Driving: Yes Leisure: Hobbies-yes (Comment) Comments: 4L 02 at home  ADL ADL ADL Comments: see FIM  Cognition Overall Cognitive Status: Within Functional Limits for tasks assessed Arousal/Alertness: Awake/alert Orientation Level: Oriented X4 Attention: Sustained Sustained Attention: Appears intact Memory: Appears intact Awareness: Impaired Awareness Impairment: Emergent  impairment Problem Solving: Impaired Problem Solving Impairment: Functional complex Safety/Judgment: Appears intact  Sensation Sensation Light Touch: Impaired Detail Light Touch Impaired Details: Impaired LLE;Impaired RUE;Impaired LUE (imparied LLE and BLE @ fingertips) Stereognosis: Appears Intact Hot/Cold: Appears Intact Proprioception:  Impaired by gross assessment (B-LE ) Coordination Gross Motor Movements are Fluid and Coordinated: Yes (@ B-UE) Fine Motor Movements are Fluid and Coordinated: Yes (@ B-UE) Coordination and Movement Description: Impaired coordination of bil LEs during functional acitvity, pt relies on vision heavilyfor being able to determine what her legs are doing.   Motor  Motor Motor: Paraplegia Motor - Skilled Clinical Observations: Lt LE more impaired (strength/sensation) than Rt LE.   Mobility  Bed Mobility Bed Mobility: Rolling Left;Left Sidelying to Sit;Supine to Sit Rolling Left: 4: Min assist Rolling Left Details: Manual facilitation for placement Left Sidelying to Sit: 3: Mod assist;HOB elevated Left Sidelying to Sit Details: Manual facilitation for weight shifting;Verbal cues for safe use of DME/AE;Verbal cues for technique Supine to Sit: 3: Mod assist;HOB elevated;With rails Supine to Sit Details: Verbal cues for safe use of DME/AE;Manual facilitation for weight shifting Transfers Sit to Stand: 1: +2 Total assist;With armrests;From chair/3-in-1 Sit to Stand Details (indicate cue type and reason): Cues and assist for UE/LE positioning. PT to block Lt or bil knees due to weakness. Facilitation through glutes. Difficulty making it to a stand from w/c, finally able to in parrallel bars with use of towel assist behind buttocks and bil knees blocked.  Stand to Sit: 1: +2 Total assist Stand to Sit Details: Pt needs assist to land safely in w/c due to impaired strength and pain.    Trunk/Postural Assessment  Cervical Assessment Cervical Assessment:  Exceptions to [facility] (forward head lean) Thoracic Assessment Thoracic Assessment: Exceptions to [facility] (kyphosis) Lumbar Assessment Lumbar Assessment: Exceptions to [facility] (weakness during dynamic sitting) Postural Control Postural Control: Deficits on evaluation Postural Limitations: impaired contorl during sit > stand   Balance Balance Balance Assessed: Yes Dynamic Sitting Balance Sitting balance - Comments: sat EOB for 10 min req UE support.  Static Standing Balance Static Standing - Balance Support: Bilateral upper extremity supported Static Standing - Level of Assistance: 1: +2 Total assist Static Standing - Comment/# of Minutes: After multiple attempts pt finally able to reach full standing position, demonstrated Rt lean and Lt sidebending. Tends to lock bil LEs for stabilty.   Extremity/Trunk Assessment RUE Assessment RUE Assessment: Within Functional Limits LUE Assessment LUE Assessment: Within Functional Limits  FIM:  FIM - Eating Eating Activity: 7: Complete independence:no helper FIM - Bathing Bathing Steps Patient Completed: Right upper leg;Left upper leg;Front perineal area Bathing: 3: Mod-Patient completes 5-7 48f 10 parts or 50-74% FIM - Upper Body Dressing/Undressing Upper body dressing/undressing: 0: Wears gown/pajamas-no public clothing FIM - Lower Body Dressing/Undressing Lower body dressing/undressing: 1: Total-Patient completed less than 25% of tasks FIM - Control and instrumentation engineer Devices: Arm rests;Bed rails;Sliding board Bed/Chair Transfer: 4: Supine > Sit: Min A (steadying Pt. > 75%/lift 1 leg);3: Bed > Chair or W/C: Mod A (lift or lower assist)   Refer to Care Plan for Long Term Goals  Recommendations for other services: None  Discharge Criteria: Patient will be discharged from OT if patient refuses treatment 3 consecutive times without medical reason, if treatment goals not met, if there is a change in medical status, if patient makes  no progress towards goals or if patient is discharged from hospital.  The above assessment, treatment plan, treatment alternatives and goals were discussed and mutually agreed upon: by patient  [facility] 11/17/2013, 12:27 PM

## 2013-11-17 NOTE — Evaluation (Signed)
aPhysical Therapy Assessment and Plan  Patient Details  Name: [name] MRN: [id number] Date of Birth: [birth year]  PT Diagnosis: Abnormal posture, Coordination disorder, Difficulty walking, Muscle weakness, Paraplegia and Pain in back Rehab Potential: Good ELOS: ~21 days   Today's Date: 11/17/2013 Time: 0730-0830 Time Calculation (min): 60 min  Problem List:  Patient Active Problem List   Diagnosis Date Noted  . Thoracic spine tumor 11/16/2013  . Urinary retention 11/12/2013  . Left leg weakness 11/12/2013  . 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 Dyspnea 10/18/2013  . Right upper lobe pneumonia/infiltrate 09/20/2013  . Lichen planus vulva 19/14/7829  . Thoracic compression fracture 09/20/2013  . COPD exacerbation 09/19/2013  . Acute delirium 09/07/2013  . Chronic diastolic congestive heart failure, NYHA class 1 09/07/2013  . Hypernatremia 09/06/2013  . Shingles 09/06/2013  . History of depression, chronic antidepressants 09/06/2013  . Physical deconditioning 09/06/2013  . Hypercarbia, acute (resolved) on chronic 09/02/2013  . Acute-on-chronic respiratory failure 09/02/2013  . 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 HYPERTRIGLYCERIDEMIA 09/16/2006  . History of anxiety - chronic alprazolam 06/10/2006  . TOBACCO ABUSE 06/10/2006  . COPD without acute bronchospasm 06/10/2006  . PULMONARY NODULE 06/10/2006  . GERD, chronic PPI use 06/10/2006  . LOW BACK PAIN 06/10/2006  . FIBROMYALGIA 06/10/2006  . SLEEP APNEA 06/10/2006  . CERVICAL CANCER, HX OF 06/10/2006  . HYSTERECTOMY, HX OF 06/10/2006    Past Medical History:  Past Medical History  Diagnosis Date  . Right-sided heart failure   . COPD (chronic obstructive pulmonary disease)   . Emphysema   . Seizures   . Thyroid disease   . Depression   . Emphysema   . Arthritis     back and legs  . Blood transfusion without reported diagnosis   . High cholesterol   . Anxiety    . Nerves   . Panic attack   . Thyroid disorder   . Hypothyroidism   . Pneumonia   . CHF (congestive heart failure)   . On home O2     4L N/C chronic  . Fibromyalgia    Past Surgical History:  Past Surgical History  Procedure Laterality Date  . Abdominal hysterectomy    . Cesarean section    . Cholecystectomy    . Laser removal condyloma    . Laminectomy N/A 11/12/2013    Procedure: THORACIC LAMINECTOMY FOR MASS;  Surgeon: Eustace Moore, MD;  Location: [facility];  Service: Neurosurgery;  Laterality: N/A;    Assessment & Plan Clinical Impression: [name] is a 53 y.o. female with history of severe COPD--oxygen dependent, FMS, multiple episodes of PNA this winter, chronic thoracic pain with compression fracture who was admitted on 11/11/13 with complaints of urinary retention as well as progressive weakness in BLE with inability to walk for a week. Patient reported epidural injection approx 9 days prior to admission with onset of LE numbness in 48 hours and progressive symptoms. MRI L-spine negative for stenosis and Dr. Ronnald Ramp consulted for input. He recommended MRI T-spine which showed "Abnormal structures within the spinal canal at the T8 level, larger on the left than on the right, which appear to compress the spinal cord". She was cleared for surgery by pulmonary and was taken to OR on 11/12/13 for T8 thoracic epidural mass resection. Pathology with fibroadipose tissue with fat necrosis as well as degenerative cartilaginous tissue with abundant necrosis. Extubated on 11/13/13 and therapy initiated. Patient transferred  to CIR on 11/16/2013 .   Patient currently requires +2 total with mobility secondary to muscle weakness and muscle paralysis, decreased cardiorespiratoy endurance and decreased oxygen support, impaired timing and sequencing, unbalanced muscle activation and decreased coordination and decreased sitting balance, decreased standing balance and decreased balance strategies.   Prior to hospitalization, patient was modified independent  with mobility and lived with Family;Son (and son'sfiance) in a Mobile home home.  Home access is 4Stairs to enter.  Patient will benefit from skilled PT intervention to maximize safe functional mobility, minimize fall risk and decrease caregiver burden for planned discharge home with 24 hour assist/supervision.  Anticipate patient will benefit from follow up [place] at discharge.  PT - End of Session Endurance Deficit: Yes PT Assessment Rehab Potential: Good Barriers to Discharge: Inaccessible home environment PT Patient demonstrates impairments in the following area(s): Balance;Endurance;Motor;Pain;Sensory PT Transfers Functional Problem(s): Bed Mobility;Bed to Chair;Car;Furniture PT Locomotion Functional Problem(s): Ambulation;Wheelchair Mobility;Stairs PT Plan PT Intensity: Minimum of 1-2 x/day ,45 to 90 minutes PT Frequency: 5 out of 7 days PT Duration Estimated Length of Stay: 21-28 days PT Treatment/Interventions: Ambulation/gait training;Balance/vestibular training;Discharge planning;DME/adaptive equipment instruction;Neuromuscular re-education;Functional electrical stimulation;Pain management;Functional mobility training;Patient/family education;Splinting/orthotics;Therapeutic Exercise;UE/LE Coordination activities;Therapeutic Activities;Psychosocial support;Stair training;UE/LE Strength taining/ROM;Wheelchair propulsion/positioning PT Transfers Anticipated Outcome(s): supervision PT Locomotion Anticipated Outcome(s): mod/max assist short distance PT Recommendation Follow Up Recommendations: Home health PT Patient destination: Home (vs. SNF pending progress and help at home) Equipment Details: to be determined  Skilled Therapeutic Intervention Session primarily focused on sit <> stands, pre-stand mechanics, and standing tolerance. Pt very hard worker and needs cues for resting appropriately. HR up to 121 with activity,SpO2 down  to 89% on 4L [place] O2. Pt needs +2 assist with all sit <> stand transfers and was unsafe to attempt gait today due to significantly impaired weakness.   At end of session discussed anticipated need for ramp, pt reports landlord has already offered to build ramp. PT recommended pt contact landlord today or tomorrow and request ramp be built in the next 2 weeks.   PT Evaluation Precautions/Restrictions Precautions Precautions: Back;Fall Precaution Comments: Back precautions reviewed (BAT) Restrictions Weight Bearing Restrictions: No General   Vital SignsTherapy Vitals Pulse Rate: 121 (at highest post standing activity) BP: 113/74 mmHg Patient Position, if appropriate: Sitting Oxygen Therapy SpO2: 89 % (at lowest during PT, mostly >94%) O2 Device: Nasal cannula O2 Flow Rate (L/min): 4 L/min Pain Pain Assessment Pain Assessment: 0-10 Pain Score: 7  Pain Location: Scapula Pain Orientation: Left;Posterior Pain Descriptors / Indicators: Aching;Sharp;Sore Pain Onset: On-going (worse when she twists) Pain Intervention(s): Repositioned;Rest Home Living/Prior Functioning Home Living Available Help at Discharge: Family;Available 24 hours/day Type of Home: Mobile home Home Access: Stairs to enter Entrance Stairs-Number of Steps: 4 Entrance Stairs-Rails: Left Home Layout: One level Additional Comments: hand-held,   Lives With: Family;Son (and son'sfiance) Prior Function Level of Independence: Independent with basic ADLs (however limited, stays in house most of time only leaves for MD appointments. Husband and son do gro[employer] shopping)  Able to Take Stairs?: Yes Driving: Yes Leisure: Hobbies-yes (Comment) Comments: 4L 02 at home  Cognition Overall Cognitive Status: Within Functional Limits for tasks assessed Arousal/Alertness: Awake/alert Orientation Level: Oriented X4 Sensation Sensation Light Touch: Impaired Detail Light Touch Impaired Details: Impaired LLE (primarily Lt  foot) Proprioception: Impaired by gross assessment (bil LEs) Coordination Gross Motor Movements are Fluid and Coordinated: No (bil LEs) Coordination and Movement Description: Impaired coordination of bil LEs during functional acitvity, pt relies on vision heavilyfor being able  to determine what her legs are doing.  Motor  Motor Motor: Paraplegia Motor - Skilled Clinical Observations: Lt LE more impaired (strength/sensation) than Rt LE.   Mobility Bed Mobility Bed Mobility: Not assessed (pt in w/c at start) Transfers Transfers: Yes Sit to Stand: 1: +2 Total assist;With armrests;From chair/3-in-1 Sit to Stand Details (indicate cue type and reason): Cues and assist for UE/LE positioning. PT to block Lt or bil knees due to weakness. Facilitation through glutes. Difficulty making it to a stand from w/c, finally able to in parrallel bars with use of towel assist behind buttocks and bil knees blocked.  Stand to Sit: 1: +2 Total assist Stand to Sit Details: Pt needs assist to land safely in w/c due to impaired strength and pain.  Lateral/Scoot Transfers: 3: Mod assist;2: Max assist (per OT report. ) Lateral/Scoot Transfer Details (indicate cue type and reason): see OT note.  Locomotion  Ambulation Ambulation: No (pt unable to perform safely today. ) Stairs / Additional Locomotion Stairs: No (unable to perform safely) Wheelchair Mobility Wheelchair Mobility: Yes Wheelchair Assistance: 4: Min Lexicographer: Both upper extremities Wheelchair Parts Management: Needs assistance Distance: 150'  Trunk/Postural Assessment  Cervical Assessment Cervical Assessment: Exceptions to [facility] (forward head) Thoracic Assessment Thoracic Assessment: Exceptions to [facility] (significant kyphosis) Lumbar Assessment Lumbar Assessment: Exceptions to [facility] (significant weakness particularly with sit>stands) Postural Control Postural Control: Deficits on evaluation (impaired standing postural control)   Balance Balance Balance Assessed: Yes Static Standing Balance Static Standing - Balance Support: Bilateral upper extremity supported Static Standing - Level of Assistance: 1: +2 Total assist Static Standing - Comment/# of Minutes: After multiple attempts pt finally able to reach full standing position, demonstrated Rt lean and Lt sidebending. Tends to lock bil LEs for stabilty.  Extremity Assessment      RLE Assessment RLE Assessment: Exceptions to [facility] RLE Strength Right Hip Flexion: 2-/5 Right Knee Flexion: 2/5 Right Knee Extension: 2+/5 Right Ankle Dorsiflexion: 2-/5 Right Ankle Inversion: 2/5 Right Ankle Eversion: 2/5 LLE Assessment LLE Assessment: Exceptions to WFL LLE PROM (degrees) LLE Overall PROM Comments: Tight heel cords, decreased passive dorsiflexion LLE Strength Left Hip Flexion: 1/5 Left Knee Flexion: 2-/5 Left Knee Extension: 2-/5 Left Ankle Dorsiflexion: 1/5 Left Ankle Inversion: 2-/5 Left Ankle Eversion: 2-/5  FIM:  FIM - Locomotion: Wheelchair Distance: 150'   Refer to Care Plan for Long Term Goals  Recommendations for other services: Neuropsych - potentially  Discharge Criteria: Patient will be discharged from PT if patient refuses treatment 3 consecutive times without medical reason, if treatment goals not met, if there is a change in medical status, if patient makes no progress towards goals or if patient is discharged from hospital.  The above assessment, treatment plan, treatment alternatives and goals were discussed and mutually agreed upon: by patient  Lahoma Rocker 11/17/2013, 10:00 AM

## 2013-11-17 NOTE — IPOC Note (Addendum)
Overall Plan of Care [facility]) Patient Details Name: ARILLA HICE MRN: [id number] DOB: Oct 21, 1960  Admitting Diagnosis: resection epidural mass  Hospital Problems: Active Problems:   Thoracic spine tumor     Functional Problem List: Nursing Bladder;Bowel;Edema;Endurance;Motor;Pain;Sensory;Skin Integrity  PT Balance;Endurance;Motor;Pain;Sensory  OT Balance;Endurance;Safety  SLP    TR  activity tolerance, balance, safety, sensory       Basic ADL's: OT Bathing;Dressing;Toileting     Advanced  ADL's: OT Simple Meal Preparation     Transfers: PT Bed Mobility;Bed to Chair;Car;Furniture  OT Toilet;Tub/Shower     Locomotion: PT Ambulation;Wheelchair Mobility;Stairs     Additional Impairments: OT None  SLP        TR      Anticipated Outcomes Item Anticipated Outcome  Self Feeding Mod I  Swallowing      Basic self-care  Supervision  Toileting  Mod I   Bathroom Transfers Supervision  Bowel/Bladder  Continent of bowel and bladder  Transfers  supervision  Locomotion  mod/max assist short distance  Communication     Cognition     Pain  </=3  Safety/Judgment  No falls with injury   Therapy Plan: PT Intensity: Minimum of 1-2 x/day ,45 to 90 minutes PT Frequency: 5 out of 7 days PT Duration Estimated Length of Stay: 21-28 days OT Intensity: Minimum of 1-2 x/day, 45 to 90 minutes OT Frequency: 5 out of 7 days OT Duration/Estimated Length of Stay: 14-21 days TR Duration/Estimatee Length of Stay:  14 days         Team Interventions: Nursing Interventions Patient/Family Education;Bladder Management;Bowel Management;Pain Management;Medication Management;Skin Care/Wound Management;Psychosocial Support  PT interventions Ambulation/gait training;Balance/vestibular training;Discharge planning;DME/adaptive equipment instruction;Neuromuscular re-education;Functional electrical stimulation;Pain management;Functional mobility training;Patient/family  education;Splinting/orthotics;Therapeutic Exercise;UE/LE Coordination activities;Therapeutic Activities;Psychosocial support;Stair training;UE/LE Strength taining/ROM;Wheelchair propulsion/positioning  OT Interventions Balance/vestibular training;Self Care/advanced ADL retraining;UE/LE Coordination activities;Wheelchair propulsion/positioning;Therapeutic Activities;Therapeutic Exercise;UE/LE Strength taining/ROM;Patient/family education;Functional mobility training;Discharge planning;DME/adaptive equipment instruction  SLP Interventions    TR Interventions  therapeutic activities, functional mobility, balance/coordation, w/c positioning/mobility, UE use/coordination, adaptive equipment use, discharge planning, leisure education, community reintegration, psychosocial support  SW/CM Interventions      Team Discharge Planning: Destination: PT-Home (vs. SNF pending progress and help at home) ,OT- Home , SLP-  Projected Follow-up: PT-Home health PT, OT-  Home health OT, SLP-  Projected Equipment Needs: PT- , OT- Sliding board;Wheelchair (measurements), SLP-  Equipment Details: PT-to be determined, OT-  Patient/family involved in discharge planning: PT- Patient,  OT-Patient, SLP-   MD ELOS: 18-20 days Medical Rehab Prognosis:  Excellent Assessment: The patient has been admitted for CIR therapies. The team will be addressing, functional mobility, strength, stamina, balance, safety, adaptive techniques/equipment, self-care, bowel and bladder mgt, patient and caregiver education, NMR, SCI education, pain mgt, back precautions, ego-support. Goals have been set at supervision to mod I for self-care and transfers/ w/c mobility. Mod to max with short dxgait.    Meredith Staggers, MD, FAAPMR      See Team Conference Notes for weekly updates to the plan of care

## 2013-11-17 NOTE — Progress Notes (Signed)
Physical Therapy Session Note  Patient Details  Name: [name] MRN: [id number] Date of Birth: [birth year]  Today's Date: 11/17/2013 Time: 1420-1450 Time Calculation (min): 30 min  Short Term Goals: Week 1:  PT Short Term Goal 1 (Week 1): Pt will be able to transfer bed<>w/c level transfers with min assist.  PT Short Term Goal 2 (Week 1): Pt will propel w/c 200' with supervision PT Short Term Goal 3 (Week 1): Pt will perform sit <> stands from w/c with max assist +1  Skilled Therapeutic Interventions/Progress Updates:    Pt appeared to have more labored breathing this session. SpO2 90-91% on 4L [place] O2 supine in bed, down to 87% at lowest during session maintained on 4L [place] O2. BP 119/73. Practiced ankle dorsiflexion/plantar flexion, heel slides (with minimal sheer force on heels) with leg lifter to assist with exercise, quad sets. Pt encouraged to work on these between therapies. Pt also educated on energy conservation.   Sliding board transfer bed>w/c with up to max assist due to posterior trunk lean/bil LE weakness and LEs had to be blocked to prevent forwards sliding. Cues and facilitation for anterior trunk lean and repositioning of bil LEs during progression across board. SpO2 93% on 4L at end of session.  Therapy Documentation Precautions:  Precautions Precautions: Back;Fall Precaution Comments: Back precautions reviewed (BAT) Restrictions Weight Bearing Restrictions: No Pain:  Not rated - thoracic pain, premedicated  See FIM for current functional status  Therapy/Group: Individual Therapy  [place], [name] 11/17/2013, 3:01 PM

## 2013-11-18 ENCOUNTER — Ambulatory Visit (HOSPITAL_COMMUNITY): Payer: Medicare Other | Admitting: Occupational Therapy

## 2013-11-18 ENCOUNTER — Inpatient Hospital Stay (HOSPITAL_COMMUNITY): Payer: Medicare Other

## 2013-11-18 ENCOUNTER — Inpatient Hospital Stay (HOSPITAL_COMMUNITY): Payer: Medicare Other | Admitting: *Deleted

## 2013-11-18 DIAGNOSIS — F341 Dysthymic disorder: Secondary | ICD-10-CM

## 2013-11-18 DIAGNOSIS — N189 Chronic kidney disease, unspecified: Secondary | ICD-10-CM

## 2013-11-18 DIAGNOSIS — J449 Chronic obstructive pulmonary disease, unspecified: Secondary | ICD-10-CM

## 2013-11-18 DIAGNOSIS — IMO0001 Reserved for inherently not codable concepts without codable children: Secondary | ICD-10-CM

## 2013-11-18 DIAGNOSIS — D497 Neoplasm of unspecified behavior of endocrine glands and other parts of nervous system: Secondary | ICD-10-CM

## 2013-11-18 LAB — BASIC METABOLIC PANEL
BUN: 41 mg/dL — ABNORMAL HIGH (ref 6–23)
CHLORIDE: 104 meq/L (ref 96–112)
CO2: 25 mEq/L (ref 19–32)
Calcium: 8.6 mg/dL (ref 8.4–10.5)
Creatinine, Ser: 0.99 mg/dL (ref 0.50–1.10)
GFR calc Af Amer: 75 mL/min — ABNORMAL LOW (ref >90)
GFR calc non Af Amer: 65 mL/min — ABNORMAL LOW (ref >90)
Glucose, Bld: 164 mg/dL — ABNORMAL HIGH (ref 70–99)
POTASSIUM: 4.5 meq/L (ref 3.7–5.3)
SODIUM: 142 meq/L (ref 137–147)

## 2013-11-18 MED ORDER — BACLOFEN 5 MG HALF TABLET
5.0000 mg | ORAL_TABLET | Freq: Two times a day (BID) | ORAL | Status: DC
  Administered 2013-11-18 – 2013-11-22 (×9): 5 mg via ORAL
  Filled 2013-11-18 (×11): qty 1

## 2013-11-18 MED ORDER — HYDROCORTISONE 2.5 % RE CREA
TOPICAL_CREAM | Freq: Three times a day (TID) | RECTAL | Status: DC
Start: 2013-11-18 — End: 2013-12-09
  Administered 2013-11-18 – 2013-11-19 (×3): via RECTAL
  Administered 2013-11-20: 1 via RECTAL
  Administered 2013-11-20 – 2013-12-09 (×41): via RECTAL
  Filled 2013-11-18 (×2): qty 28.35

## 2013-11-18 NOTE — Progress Notes (Signed)
Occupational Therapy Session Note  Patient Details  Name: [name] MRN: [id number] Date of Birth: 04/16/1961  Today's Date: 11/18/2013 Time: 1100-1130 Time Calculation (min): 30 min  Short Term Goals: Week 1:  OT Short Term Goal 1 (Week 1): Patient will complete lower body bathing using AE, prn, supine in bed with min A OT Short Term Goal 2 (Week 1): Patient will complete upper body bathing at sink or at edge of bed with setup assist OT Short Term Goal 3 (Week 1): Patient will complete dressing using AE prn, sitting and standing, with mod assist OT Short Term Goal 4 (Week 1): Patient will maintain post-surgical safety precautions 100% of the time during ADL.  Skilled Therapeutic Interventions/Progress Updates:  Co-Treatment with PT 1030-1130. Focused skilled therapy on dynamic sitting balance/tolerance/endurance for donning of bilateral shoes, functional slide board transfers, w/c propulsion from room <> therapy gym, sit<> stands using standing frame, BUE strengthening exercises using 2lb dumbbells while standing in frame, energy conservation, and overall activity tolerance/endurance. Patient's 02 sats decreased > as low as 85 on 4 liters of oxygen; patient maintained 4 liters of 02 via [place] throughout entire session. Therapists encouraged pursed lip breathing and seated rest breaks prn. Therapist administered drop arm BSC > patient's room for safe and effective slide board transfers. At end of session, patient got back to bed using slide board and left supine in bed with all needed items within reach.   Precautions:  Precautions Precautions: Back;Fall Precaution Comments: Back precautions reviewed (BAT) Restrictions Weight Bearing Restrictions: No  See FIM for current functional status  Therapy/Group: Co-Treatment  Alvie Speltz 11/18/2013, 1:50 PM

## 2013-11-18 NOTE — Progress Notes (Addendum)
[facility] PHYSICAL MEDICINE & REHABILITATION     PROGRESS NOTE    Subjective/Complaints: Had a good day with therapies. Left leg is tight though A 12 point review of systems has been performed and if not noted above is otherwise negative.   Objective: Vital Signs: Blood pressure 109/71, pulse 85, temperature 97.5 F (36.4 C), temperature source Oral, resp. rate 19, height 5\' 2"  (1.575 m), weight 72.2 kg (159 lb 2.8 oz), SpO2 91.00%. Dg Chest 2 View  11/16/2013   CLINICAL DATA:  Shortness of breath  EXAM: CHEST  2 VIEW  COMPARISON:  11/12/2013  FINDINGS: The endotracheal tube is been removed as has the nasogastric catheter. The cardiac shadow is stable. The lungs are well aerated with very minimal right basilar atelectasis. The previously seen effusions are improved in the interval. Chronic compression deformity is noted within the mid thoracic spine.  IMPRESSION: Right basilar atelectasis.   Electronically Signed   By: Inez Catalina M.D.   On: 11/16/2013 19:29   Dg Abd 1 View  11/16/2013   CLINICAL DATA:  Abdominal pain  EXAM: ABDOMEN - 1 VIEW  COMPARISON:  11/16/2013 925 hrs  FINDINGS: Scattered large and small bowel gas is again identified. Fecal material is again noted throughout the colon. No free air is seen. Postsurgical changes in the left hip are again noted. No new focal abnormality is seen.  IMPRESSION: No change from the prior exam.   Electronically Signed   By: Inez Catalina M.D.   On: 11/16/2013 19:30   Dg Abd 2 Views  11/16/2013   CLINICAL DATA:  Mid abdominal pain, constipation  EXAM: ABDOMEN - 2 VIEW  COMPARISON:  DG ABD PORTABLE 1V dated 08/20/2013; DG ABDOMEN 2V dated 12/23/2012; CT ABD-PELV W/ CM dated 12/28/2012  FINDINGS: Large colonic stool burden without evidence of obstruction. There is moderate gaseous distention of the stomach. No pneumoperitoneum, pneumatosis or portal venous gas.  Post cholecystectomy.  Post ORIF of the left femoral neck.  Note definitive abnormal  intra-abdominal calcifications.  No acute osseus abnormalities.  IMPRESSION: Large colonic stool burden without evidence of obstruction.   Electronically Signed   By: Sandi Mariscal M.D.   On: 11/16/2013 10:23    Recent Labs  11/16/13 0430 11/17/13 0600  WBC 7.5 7.1  HGB 12.0 12.2  HCT 36.9 37.2  PLT 206 202    Recent Labs  11/17/13 0600 11/18/13 0603  NA 144 142  K 4.7 4.5  CL 106 104  GLUCOSE 146* 164*  BUN 42* 41*  CREATININE 1.05 0.99  CALCIUM 8.9 8.6   CBG (last 3)  No results found for this basename: GLUCAP,  in the last 72 hours  Wt Readings from Last 3 Encounters:  11/16/13 72.2 kg (159 lb 2.8 oz)  11/14/13 73.755 kg (162 lb 9.6 oz)  11/14/13 73.755 kg (162 lb 9.6 oz)    Physical Exam:  Constitutional: She is oriented to person, place, and time. She appears well-developed and well-nourished.  HENT: dentition fair. Oral mucosa pink/moist  Head: Normocephalic and atraumatic.  Eyes: Conjunctivae are normal.  Cardiovascular: Normal rate and regular rhythm. No murmur  Respiratory: No respiratory distress. She has wheezes (prolonged expiratory wheezes. ).  GI: Soft. Bowel sounds are normal. She exhibits no distension. There is no tenderness.  Musculoskeletal: She exhibits no edema.  Left leg shorter than right. ?1/2 inch  Neurological: She is alert and oriented to person, place, and time.  Paraparesis with sensory deficits. DTR absent BLE.  LLE tr to 1/5 HF,KE. Tr at ankle. RLE is 1+hf, 2-ke, 3 ankle. Sensation 1/2 LLE 1+ RLE. T8 Sensory level. Tone left hamstring/quad is 1+ to 2/4 Skin: Skin is warm and dry.  Psychiatric: She has a normal mood and affect. Her behavior is normal.    Assessment/Plan: 1. 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 Physiatrist is providing close team supervision and 24 hour management of  active medical problems listed below. Physiatrist and rehab team continue to assess barriers to discharge/monitor patient progress toward functional and medical goals. FIM: FIM - Bathing Bathing Steps Patient Completed: Right upper leg;Left upper leg;Front perineal area Bathing: 3: Mod-Patient completes 5-7 36f 10 parts or 50-74%  FIM - Upper Body Dressing/Undressing Upper body dressing/undressing: 0: Wears gown/pajamas-no public clothing FIM - Lower Body Dressing/Undressing Lower body dressing/undressing steps patient completed: Thread/unthread right underwear leg;Don/Doff right sock Lower body dressing/undressing: 1: Two helpers (at sit > stand level)        FIM - Engineer, site Assistive Devices: Arm rests;Bed rails;Sliding board Bed/Chair Transfer: 4: Supine > Sit: Min A (steadying Pt. > 75%/lift 1 leg);2: Bed > Chair or W/C: Max A (lift and lower assist)  FIM - Locomotion: Wheelchair Distance: 150' Locomotion: Wheelchair: 4: Travels 150 ft or more: maneuvers on rugs and over door sillls with minimal assistance (Pt.>75%) FIM - Locomotion: Ambulation Locomotion: Ambulation Assistive Devices:  (unable to perform safely)  Comprehension Comprehension Mode: Auditory Comprehension: 5-Follows basic conversation/direction: With extra time/assistive device  Expression Expression Mode: Verbal Expression: 6-Expresses complex ideas: With extra time/assistive device  Social Interaction Social Interaction: 6-Interacts appropriately with others with medication or extra time (anti-anxiety, antidepressant).  Problem Solving Problem Solving: 5-Solves basic problems: With no assist  Memory Memory: 5-Requires cues to use assistive device Medical Problem List and Plan:  Thoracic mass with incomplete paraplegia, s/p resection  1. DVT Prophylaxis/Anticoagulation: Mechanical: Antiembolism stockings, knee (TED hose) Bilateral lower extremities  Sequential compression  devices, below knee Bilateral lower extremities  -dopplers negative.  2. Chronic pain due to fibromyalgia as well as back pain with neuropathy/ Pain Management: Use oxycodone 15 mg qid at home as well Lyrica 100 mg tid and cymbalta bid    -follow for activity tolerance 3. Depression with anxiety and panic attacks/Mood: Team to provide ego support. LCSW to follow for evaluation and support. Will continue cymbalta bid, xanax 0.5 mg tid as well as trazodone at bedtime for mood and insomnia.  4.Neuropsych: This patient is capable of making decisions on her own behalf.  5. Severe COPD: Continue oxygen 4 liters--used at home. Continue spiriva and dulera. Complaining of increase in SOB.  -albuterol to qid as at home.   -sputum culture to rule out infectious cause.   -cxr with atelectasis---discussed with the patient the importance of IS 6. Bowel and bladder program: Continue foley for now. On linzess but ineffective--will need augmentation of bowel program--SSE today. Add miralax daily. Had good bm again today  -regular program 7. Chronic renal failure: Baseline BUN/Cr- 39/1.80 at admission. Near baseline---continue current meds  -BUN/CR close to baseline---recheck next week 8. Diastolic dysfunction with tachycardia (as compensatory for hypoxia): Resumed lasix at 20 mg daily as well as cardizem for BP/ heart rate control. Low salt diet. --need more regular weights 9. Surgical- decadron taper  10. Bladder: dc foley and begin voiding trial   LOS (Days) 2 A  FACE TO FACE EVALUATION WAS PERFORMED  SWARTZ,ZACHARY T 11/18/2013 8:11 AM

## 2013-11-18 NOTE — Progress Notes (Signed)
Physical Therapy Session Note  Patient Details  Name: [name] MRN: [id number] Date of Birth: June 10, 1961  Today's Date: 11/18/2013 Time: 1030-1100 Time Calculation (min): 30 min (60 min total co-treat)  Short Term Goals: Week 1:  PT Short Term Goal 1 (Week 1): Pt will be able to transfer bed<>w/c level transfers with min assist.  PT Short Term Goal 2 (Week 1): Pt will propel w/c 200' with supervision PT Short Term Goal 3 (Week 1): Pt will perform sit <> stands from w/c with max assist +1  Skilled Therapeutic Interventions/Progress Updates:    Session focused on standing tolerance in standing frame x 4 bouts. Standing activities included: Bil UE strengthening with OT, partial squats with PT facilitating glutes, and lateral weight shifting. SpO2 down to 85% (at lowest), HR up to 137 bpm (with activity). Resting SPO2 91-92% and HR ~105. Pt maintained on 4 L [place] O2 throughout session. Sliding board transfer bed <> w/c performed with mod assist, PT needed for w/c and sliding board placement and set-up. Cues and assist for appropriate LE placement/advancement and weight shift. Pt demonstrated lateral lean with pushup component for pressure relief, pt educated to perform every 20-30 min when seated in w/c for maintaining skin integrity.   Therapy Documentation Precautions:  Precautions Precautions: Back;Fall Precaution Comments: Back precautions reviewed (BAT) Restrictions Weight Bearing Restrictions: No Pain:  some pain reported in thoracic spine with UE exercise, w/c pushups however pt did not rate. Pt did not request pain medication  See FIM for current functional status  Therapy/Group: Co-Treatment with OT  Lahoma Rocker 11/18/2013, 12:07 PM

## 2013-11-18 NOTE — Progress Notes (Signed)
Physical Therapy Session Note  Patient Details  Name: [name] MRN: [id number] Date of Birth: Jul 30, 1961  Today's Date: 11/18/2013 Time: 13:40-14:15 (39min)  Short Term Goals: Week 1:  PT Short Term Goal 1 (Week 1): Pt will be able to transfer bed<>w/c level transfers with min assist.  PT Short Term Goal 2 (Week 1): Pt will propel w/c 200' with supervision PT Short Term Goal 3 (Week 1): Pt will perform sit <> stands from w/c with max assist +1  Skilled Therapeutic Interventions/Progress Updates:  Tx focused on sliding board transfers bed<>BSC and bed<>WC with sliding board; therex in sitting for bil LE strengthening and stretching.  Pt c/o 2/10 back pain, rest breaks provided prn.   Performed multiple bed<>BSC transfers with sliding board in preparation for getting catheter out today. Pt needed board positioning assist and mod>>min A for steadying and advancing hips; relatively level transfers x4 each direction. Pt reposonds well to verbal cues for technique and foot placement.   Seated LAQ x10 and LAQ x 10 with 5 sec holds. bil heel cord stretching x35min each.  Attempted sit<>stand at sink, but pt too fatigued to safely complete task.  Pt returned to bed with Mod A, left with all needs in reach.       Therapy Documentation Precautions:  Precautions Precautions: Back;Fall Precaution Comments: Back precautions reviewed (BAT) Restrictions Weight Bearing Restrictions: No   Vital Signs: Oxygen Therapy SpO2: 90 % O2 Device: Nasal cannula O2 Flow Rate (L/min): 4 L/min    See FIM for current functional status  Therapy/Group: Individual Therapy  Kennieth Rad, PT, DPT  11/18/2013, 1:56 PM

## 2013-11-18 NOTE — Progress Notes (Signed)
Orthopedic Tech Progress Note Patient Details:  [name] [birth year] [id number]  Patient ID: [name], female   DOB: 1961/02/26, 53 y.o.   MRN: [id number]   Irish Elders 11/18/2013, 8:02 [facility] for bilateral prafos

## 2013-11-18 NOTE — Progress Notes (Signed)
Occupational Therapy Session Note  Patient Details  Name: [name] MRN: [id number] Date of Birth: 1961-03-10  Today's Date: 11/18/2013 Time: 0830-0930 Time Calculation (min): 60 min  Short Term Goals: Week 1:  OT Short Term Goal 1 (Week 1): Patient will complete lower body bathing using AE, prn, supine in bed with min A OT Short Term Goal 2 (Week 1): Patient will complete upper body bathing at sink or at edge of bed with setup assist OT Short Term Goal 3 (Week 1): Patient will complete dressing using AE prn, sitting and standing, with mod assist OT Short Term Goal 4 (Week 1): Patient will maintain post-surgical safety precautions 100% of the time during ADL.  Skilled Therapeutic Interventions/Progress Updates: ADL-retraining with focus on adapted bathing/dressing skills at w/c-level sink side for UB, legs and feet, and supine in bed for peri-area and buttocks.   Patient performed upper body bathing and dressing at sink side in w/c this session with setup assist and supervision to manage w/c.  While sitting in w/c patient was able to pull each foot up to wash, with leg crossed over knee.  Patient attempted sit > stand supported at sink but was unable to perform task even with max assist to lift buttocks d/t LE weakness and SOB.   Patient performed slide board transfer from w/c to bed with max assist to transfer and mod assist to lift legs into bed.  Patient performed peri-area bathing with setup assist supine and required assist only to bathe buttocks and assist with donning pants over hip.   Patient left supine in bed, HOB elevated and call light/phone placed within reach.    Therapy Documentation Precautions:  Precautions Precautions: Back;Fall Precaution Comments: Back precautions reviewed (BAT) Restrictions Weight Bearing Restrictions: No  Vital Signs: Oxygen Therapy SpO2: 90 % O2 Device: Nasal cannula O2 Flow Rate (L/min): 4 L/min  Pain: No pain   ADL: ADL ADL Comments:  see FIM  See FIM for current functional status  Therapy/Group: Individual Therapy  Second session: Time: 1445-1530 Time Calculation (min):   min  Pain Assessment: No pain   Skilled Therapeutic Interventions: ADL-retraining with focus on slide board toilet transfers, energy conservation, sequencing, problem-solving, and clothing management.  Patient received supine in bed.   After min assist to rise from supine to sitting @ EOB, patient attempted to don shoes but was unsuccessful due to foot spasms.   OT then provided slide board and mod assist for pt transfer to bariatric BSC.  However patient did not fit BSC opening well and became lodged in opening while attempting lateral leans to perform clothing management.  Patient was unable to remove clothing and required vc to rest d/t worsening SOB.   Patient agreed to return to bed for assist with donning pants again but was exhausted by toileting tasks and required max assist to pull up pants while rolling right to left.  See FIM for current functional status  Therapy/Group: Individual Therapy  [place] 11/18/2013, 12:45 PM

## 2013-11-19 DIAGNOSIS — J449 Chronic obstructive pulmonary disease, unspecified: Secondary | ICD-10-CM

## 2013-11-19 DIAGNOSIS — R5381 Other malaise: Secondary | ICD-10-CM

## 2013-11-19 DIAGNOSIS — J9620 Acute and chronic respiratory failure, unspecified whether with hypoxia or hypercapnia: Secondary | ICD-10-CM

## 2013-11-19 DIAGNOSIS — G839 Paralytic syndrome, unspecified: Secondary | ICD-10-CM

## 2013-11-19 LAB — CULTURE, BLOOD (ROUTINE X 2)
CULTURE: NO GROWTH
CULTURE: NO GROWTH

## 2013-11-19 LAB — CULTURE, RESPIRATORY W GRAM STAIN: Culture: NORMAL

## 2013-11-19 LAB — CULTURE, RESPIRATORY

## 2013-11-19 NOTE — Progress Notes (Signed)
Nursing Note: No void since foley discontinued on previous shift.Pt attempted to void but was unable to .Scanned for >300 and in and out cathed by this RN for 375 cc.Pt tolerated well.wbb

## 2013-11-19 NOTE — Progress Notes (Signed)
Patient ID: [name], female   DOB: Oct 12, 1960, 53 y.o.   MRN: [id number]     PHYSICAL MEDICINE & REHABILITATION     PROGRESS NOTE    Subjective/Complaints:  11/19/13.  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 Medical problems include COPD, history of fibromyalgia.  Hospital course, complicated by HCAP and physical deconditioning.    Objective: Vital Signs: Blood pressure 120/79, pulse 84, temperature 97.5 F (36.4 C), temperature source Oral, resp. rate 20, height 5\' 2"  (1.575 m), weight 72.2 kg (159 lb 2.8 oz), SpO2 95.00%. No results found.  Recent Labs  11/17/13 0600  WBC 7.1  HGB 12.2  HCT 37.2  PLT 202    Recent Labs  11/17/13 0600 11/18/13 0603  NA 144 142  K 4.7 4.5  CL 106 104  GLUCOSE 146* 164*  BUN 42* 41*  CREATININE 1.05 0.99  CALCIUM 8.9 8.6   CBG (last 3)  No results found for this basename: GLUCAP,  in the last 72 hours  Wt Readings from Last 3 Encounters:  11/16/13 72.2 kg (159 lb 2.8 oz)  11/14/13 73.755 kg (162 lb 9.6 oz)  11/14/13 73.755 kg (162 lb 9.6 oz)   Patient Vitals for the past 24 hrs:  BP Temp Temp src Pulse Resp SpO2  11/19/13 0903 - - - - - 95 %  11/19/13 0615 120/79 mmHg 97.5 F (36.4 C) Oral 84 20 95 %  11/18/13 2201 - - - 88 18 92 %  11/18/13 1539 - - - - - 93 %  11/18/13 1300 99/63 mmHg 97.8 F (36.6 C) Oral 80 17 90 %  11/18/13 1208 - - - - - 90 %     Intake/Output Summary (Last 24 hours) at 11/19/13 1022 Last data filed at 11/19/13 0737  Gross per 24 hour  Intake    480 ml  Output   1350 ml  Net   -870 ml    Physical Exam:  Constitutional: She is oriented to person, place, and time. She appears well-developed and well-nourished.  HENT: dentition fair. Oral mucosa pink/moist  N/c O2 in place Head: Normocephalic and atraumatic.  Eyes: Conjunctivae are normal.  Cardiovascular: Normal rate and regular rhythm. No  murmur  Respiratory: No respiratory distress. She has rhonchi and congested cough GI: Soft. Bowel sounds are normal. She exhibits no distension. There is no tenderness.  Musculoskeletal: She exhibits no edema.  Left leg shorter than right. ?1/2 inch  Neurological: She is alert and oriented to person, place, and time.  Paraparesis with sensory deficits.  Moves both lower extremities, right greater than the left  Skin: Skin is warm and dry.  Psychiatric: She has a normal mood and affect. Her behavior is normal.    Assessment/Plan: 1. 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  2. DVT Prophylaxis/Anticoagulation: Mechanical: Antiembolism stockings, knee (TED hose) Bilateral lower extremities  Sequential compression devices, below knee Bilateral lower extremities  -dopplers negative.  3. Chronic pain due to fibromyalgia as well as back pain with neuropathy/ Pain Management: Use oxycodone 15 mg qid at home as well Lyrica 100 mg tid and cymbalta bid    -follow for activity tolerance 4. Depression with anxiety and panic attacks/Mood: Team to provide ego support. LCSW to follow for evaluation and support. Will continue cymbalta bid, xanax 0.5 mg tid  as well as trazodone at bedtime for mood and insomnia.   5. Severe COPD: Continue oxygen 4 liters--used at home. Continue spiriva and dulera. Complaining of increase in SOB.  -albuterol to qid as at home.   -sputum culture to rule out infectious cause.   -cxr with atelectasis---discussed with the patient the importance of IS  6. Chronic renal failure: Baseline BUN/Cr- 39/1.80 at admission. Near baseline---continue current meds  -BUN/CR close to baseline---recheck next week 7. Diastolic dysfunction with tachycardia (as compensatory for hypoxia): Resumed lasix at 20 mg daily as well as cardizem for BP/  heart rate control. Low salt diet. --need more regular weights    LOS (Days) 3 A FACE TO FACE EVALUATION WAS PERFORMED  Nyoka Cowden 11/19/2013 10:20 AM

## 2013-11-20 ENCOUNTER — Encounter (HOSPITAL_COMMUNITY): Payer: Medicare Other

## 2013-11-20 ENCOUNTER — Inpatient Hospital Stay (HOSPITAL_COMMUNITY): Payer: Medicare Other | Admitting: *Deleted

## 2013-11-20 ENCOUNTER — Inpatient Hospital Stay (HOSPITAL_COMMUNITY): Payer: Medicare Other

## 2013-11-20 NOTE — Progress Notes (Signed)
Occupational Therapy Session Note  Patient Details  Name: [name] MRN: [id number] Date of Birth: [birth year]  Today's Date: 11/20/2013 Time: 1000-1057 and 1345-1415 Time Calculation (min): 57 min and 30 min  Short Term Goals: Week 1:  OT Short Term Goal 1 (Week 1): Patient will complete lower body bathing using AE, prn, supine in bed with min A OT Short Term Goal 2 (Week 1): Patient will complete upper body bathing at sink or at edge of bed with setup assist OT Short Term Goal 3 (Week 1): Patient will complete dressing using AE prn, sitting and standing, with mod assist OT Short Term Goal 4 (Week 1): Patient will maintain post-surgical safety precautions 100% of the time during ADL.  Skilled Therapeutic Interventions/Progress Updates:    Session 1: Pt seen for ADL retraining with focus on activity tolerance, sit<>stand, and functional transfers. Pt received supine in bed and agreeable to bathing and dressing this AM at sink from w/c level. Pt completed sliding board transfer with mod assist and emphasis on directing care for placement of board and positioning of w/c. Pt utilized crossover technique for LB bathing and dressing. Pt required +2 assist for sit<>stand during bathing/dressing for peri hygiene and clothing management around waist. Pt fatigued quickly throughout session requiring cues for rest breaks and pursed lip breathing. Pt on 4L O2 throughout session and SpO2>96%. At end of session pt left sitting in w/c with all needs in reach.   Session 2: Pt seen for 1:1 OT session with focus on BUE strengthening, sitting balance, and functional transfers. Pt received supine on mat table following PT session. Completed supine>sit with min assist. Engaged in theraband exercises for BUE strengthening to increase independence in sliding board transfer. Pt required min-supervision for sitting balance during exercises. Pt required frequent rest breaks during exercises d/t fatigue and SpO2>96%  throughout session. Completed sliding board transfer mat table>w/c with mod assist and pt assisting with placement of board. Pt required min cues for safety during transfer to ensure therapist had BLEs blocked. Pt then completed sliding board transfer w/c>bed with mod assist and min cues for positioning of w/c and assistance with placement of board. Pt left supine in bed with all needs in reach.   Therapy Documentation Precautions:  Precautions Precautions: Back;Fall Precaution Comments: Back precautions reviewed (BAT) Restrictions Weight Bearing Restrictions: No General:   Vital Signs: Oxygen Therapy SpO2: 95 % O2 Device: Nasal cannula O2 Flow Rate (L/min): 4 L/min Pain: Pt reporting 6/10 back pain. RN provided pain medication.   See FIM for current functional status  Therapy/Group: Individual Therapy  Duayne Cal 11/20/2013, 10:58 AM

## 2013-11-20 NOTE — Progress Notes (Signed)
Physical Therapy Session Note  Patient Details  Name: [name] MRN: [id number] Date of Birth: 1961/01/27  Today's Date: 11/20/2013 Time: 1100-1200 session one              1315-1345 session two Pt missed 15 mins of PT due to nursing care    Time Calculation (min): 60 min session one                                              30 min session two  Short Term Goals: Week 1:  PT Short Term Goal 1 (Week 1): Pt will be able to transfer bed<>w/c level transfers with min assist.  PT Short Term Goal 2 (Week 1): Pt will propel w/c 200' with supervision PT Short Term Goal 3 (Week 1): Pt will perform sit <> stands from w/c with max assist +1  Skilled Therapeutic Interventions/Progress Updates:    Pt seen in two treatment sessions today.  Session one:   Pt received sitting in wheelchair. Performed wheelchair mobility x 40' with mod assist to gym. Standing frame x 3 trials with max vcs to improve upright posture, able to tolerate up to 3 mins of standing in lift, displays moderate fatigue and requires rest after each trial.  Sliding board transfer to mat table with mod +2, scoot laterally both directions with pt requiring therapist assist and facilitation to improve BOS and WB through LEs during scooting. Therapist B LE stretching for HS and heel cord x 3 each with 20 second hold, ankle pumps with pt displaying improved ROM after stretching. Sitting balance activities and wheelchair mobility back to room x 75' with min/mod assist.  [place] two:   Pt received from nursing after being catheterized due to urinary retention. Supine in bed. Performed supine to sit with mod assist and mod vcs, sliding board transfer bed to w/c with mod A +2 to improve scooting and sequencing. Wheelchair  Mobility for 75' x 2 min/ mod assist,  sliding board transfer to mat. Therapist instructed B LE supine AAROM and strengthening for quad/glut sets, ankle pumps, heel slides. Abduction and partial bridging x 10 reps each  with pt reporting this is the best her legs have felt. Pt very motivated. Pt transferred to OT care after treatment complete.     Therapy Documentation Precautions:  Precautions Precautions: Back;Fall Precaution Comments: Back precautions reviewed (BAT) Restrictions Weight Bearing Restrictions: No    Vital Signs: SpO2 with 4L of o2 95% maintained with varying activities in both sessions 102 HR resting increases to 128-132bpm with activity    Pain: Pain Assessment Pain Score: 4 in session one, 3 in session two Faces Pain Scale: Hurts a little bit                   See FIM for current functional status  Therapy/Group: Individual Therapy both sessions  Jeanette Caprice 11/20/2013, 1:18 PM

## 2013-11-20 NOTE — Progress Notes (Signed)
Patient ID: [name], female   DOB: 04-Jul-1961, 53 y.o.   MRN: [id number]  Patient ID: [name], female   DOB: 1961/04/18, 53 y.o.   MRN: [id number]    [place]     PROGRESS NOTE    Subjective/Complaints:  11/20/13.    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 Medical problems include COPD, history of fibromyalgia.  Hospital course, complicated by HCAP and physical deconditioning. Still with congested cough and intermittent wheezing.  No sputum production or fever    Objective: Vital Signs: Blood pressure 102/67, pulse 86, temperature 98.6 F (37 C), temperature source Oral, resp. rate 18, height 5\' 2"  (1.575 m), weight 72.2 kg (159 lb 2.8 oz), SpO2 95.00%. No results found. No results found for this basename: WBC, HGB, HCT, PLT,  in the last 72 hours  Recent Labs  11/18/13 0603  NA 142  K 4.5  CL 104  GLUCOSE 164*  BUN 41*  CREATININE 0.99  CALCIUM 8.6   CBG (last 3)  No results found for this basename: GLUCAP,  in the last 72 hours  Wt Readings from Last 3 Encounters:  11/16/13 72.2 kg (159 lb 2.8 oz)  11/14/13 73.755 kg (162 lb 9.6 oz)  11/14/13 73.755 kg (162 lb 9.6 oz)   Patient Vitals for the past 24 hrs:  BP Temp Temp src Pulse Resp SpO2  11/20/13 0732 - - - - - 95 %  11/20/13 0612 102/67 mmHg 98.6 F (37 C) Oral 86 18 94 %  11/19/13 1632 122/75 mmHg 98.3 F (36.8 C) Oral 87 21 96 %  11/19/13 1631 - - - - - 95 %  11/19/13 1249 - - - - - 95 %     Intake/Output Summary (Last 24 hours) at 11/20/13 0949 Last data filed at 11/20/13 0901  Gross per 24 hour  Intake   1720 ml  Output   1950 ml  Net   -230 ml    Physical Exam:  Constitutional: She is oriented to person, place, and time. She appears well-developed and well-nourished.  HENT: dentition fair. Oral mucosa pink/moist  N/c O2 in place Head: Normocephalic and  atraumatic.  Eyes: Conjunctivae are normal.  Cardiovascular: Normal rate and regular rhythm. No murmur  Respiratory: No respiratory distress. She has rhonchi and congested cough; faint exp wheezing noted this am GI: Soft. Bowel sounds are normal. She exhibits no distension. There is no tenderness.  Musculoskeletal: She exhibits no edema.  Left leg shorter than right. ?1/2 inch  Neurological: She is alert and oriented to person, place, and time.  Paraparesis with sensory deficits.  Moves both lower extremities, right greater than the left  Skin: Skin is warm and dry.  Psychiatric: She has a normal mood and affect. Her behavior is normal.    Assessment/Plan: 1. 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  2. DVT Prophylaxis/Anticoagulation: Mechanical: Antiembolism stockings, knee (TED hose) Bilateral lower extremities  Sequential compression devices, below knee Bilateral lower extremities  -dopplers negative.  3. Chronic pain due to fibromyalgia as well as back pain with neuropathy/ Pain Management: Use oxycodone 15 mg qid at home as well Lyrica 100 mg tid and cymbalta bid    -follow for activity tolerance 4. Depression with anxiety and panic attacks/Mood: Team to provide ego support. LCSW  to follow for evaluation and support. Will continue cymbalta bid, xanax 0.5 mg tid as well as trazodone at bedtime for mood and insomnia.   5. Severe COPD: Continue oxygen 4 liters--used at home. Continue spiriva and dulera. Complaining of increase in SOB.  -albuterol to qid as at home.   -sputum culture to rule out infectious cause.   -cxr with atelectasis---discussed with the patient the importance of IS  6. Chronic renal failure: Baseline BUN/Cr- 39/1.80 at admission. Near baseline---continue current meds  -BUN/CR close to baseline---recheck next  week 7. Diastolic dysfunction with tachycardia (as compensatory for hypoxia): Resumed lasix at 20 mg daily as well as cardizem for BP/ heart rate control. Low salt diet. --need more regular weights    LOS (Days) 4 A FACE TO FACE EVALUATION WAS PERFORMED  Nyoka Cowden 11/20/2013 9:49 AM

## 2013-11-21 ENCOUNTER — Inpatient Hospital Stay (HOSPITAL_COMMUNITY): Payer: Medicare Other

## 2013-11-21 ENCOUNTER — Inpatient Hospital Stay (HOSPITAL_COMMUNITY): Payer: Medicare Other | Admitting: Physical Therapy

## 2013-11-21 DIAGNOSIS — F341 Dysthymic disorder: Secondary | ICD-10-CM

## 2013-11-21 DIAGNOSIS — IMO0001 Reserved for inherently not codable concepts without codable children: Secondary | ICD-10-CM

## 2013-11-21 DIAGNOSIS — N189 Chronic kidney disease, unspecified: Secondary | ICD-10-CM

## 2013-11-21 DIAGNOSIS — J449 Chronic obstructive pulmonary disease, unspecified: Secondary | ICD-10-CM

## 2013-11-21 DIAGNOSIS — D497 Neoplasm of unspecified behavior of endocrine glands and other parts of nervous system: Secondary | ICD-10-CM

## 2013-11-21 LAB — URINALYSIS, ROUTINE W REFLEX MICROSCOPIC
BILIRUBIN URINE: NEGATIVE
GLUCOSE, UA: NEGATIVE mg/dL
HGB URINE DIPSTICK: NEGATIVE
KETONES UR: NEGATIVE mg/dL
Leukocytes, UA: NEGATIVE
Nitrite: NEGATIVE
PROTEIN: NEGATIVE mg/dL
Specific Gravity, Urine: 1.019 (ref 1.005–1.030)
Urobilinogen, UA: 0.2 mg/dL (ref 0.0–1.0)
pH: 6 (ref 5.0–8.0)

## 2013-11-21 LAB — TROPONIN I

## 2013-11-21 MED ORDER — TAMSULOSIN HCL 0.4 MG PO CAPS: 0.4000 mg | ORAL_CAPSULE | Freq: Every day | ORAL | Status: DC

## 2013-11-21 MED ORDER — LEVALBUTEROL HCL 0.63 MG/3ML IN NEBU
0.6300 mg | INHALATION_SOLUTION | Freq: Three times a day (TID) | RESPIRATORY_TRACT | Status: DC
  Administered 2013-11-21 – 2013-11-23 (×8): 0.63 mg via RESPIRATORY_TRACT
  Filled 2013-11-21 (×13): qty 3

## 2013-11-21 MED ORDER — LEVALBUTEROL HCL 0.63 MG/3ML IN NEBU
0.6300 mg | INHALATION_SOLUTION | RESPIRATORY_TRACT | Status: DC | PRN
  Administered 2013-12-05: 0.63 mg via RESPIRATORY_TRACT
  Filled 2013-11-21 (×3): qty 3

## 2013-11-21 NOTE — Progress Notes (Signed)
Patient with left chest wall pain--worse with palpation and deep inspiration. Continues to have dry congested cough and reports malaise this am. Has severe COPD and frequent PNA in the past few months. Just had a respiratory treatment.   Exam:  Heart: Tachy but regular. Lungs: coarse crackles LLL and diffuse expiratory wheezes.  A/P 1. Chest pain: Question MS related. Will check EKG and serial cardiac enzymes. Will schedule nebs prior to am therapies. Rule out acute pulmonary  Process--question bronchitis. Change albuterol to xopenex.

## 2013-11-21 NOTE — Progress Notes (Signed)
[place] PHYSICAL MEDICINE & REHABILITATION     PROGRESS NOTE    Subjective/Complaints: Reports some increased cough, ?wheezing over weekend. Bladder not emptying A 12 point review of systems has been performed and if not noted above is otherwise negative.   Objective: Vital Signs: Blood pressure 130/88, pulse 91, temperature 97.9 F (36.6 C), temperature source Oral, resp. rate 18, height 5\' 2"  (1.575 m), weight 72.2 kg (159 lb 2.8 oz), SpO2 94.00%. No results found. No results found for this basename: WBC, HGB, HCT, PLT,  in the last 72 hours No results found for this basename: NA, K, CL, CO, GLUCOSE, BUN, CREATININE, CALCIUM,  in the last 72 hours CBG (last 3)  No results found for this basename: GLUCAP,  in the last 72 hours  Wt Readings from Last 3 Encounters:  11/21/13 72.2 kg (159 lb 2.8 oz)  11/14/13 73.755 kg (162 lb 9.6 oz)  11/14/13 73.755 kg (162 lb 9.6 oz)    Physical Exam:  Constitutional: She is oriented to person, place, and time. She appears well-developed and well-nourished.  HENT: dentition fair. Oral mucosa pink/moist  Head: Normocephalic and atraumatic.  Eyes: Conjunctivae are normal.  Cardiovascular: Normal rate and regular rhythm. No murmur  Respiratory: No respiratory distress. She has less wheezes more rhonchi today GI: Soft. Bowel sounds are normal. She exhibits no distension. There is no tenderness.  Musculoskeletal: She exhibits no edema.  Left leg shorter than right. ?1/2 inch  Neurological: She is alert and oriented to person, place, and time.  Paraparesis with sensory deficits. DTR absent BLE. LLE tr to 1/5 HF,KE. Tr at ankle. RLE is 1+hf, 2-ke, 3 ankle. Sensation 1/2 LLE 1+ RLE. T8 Sensory level. Tone left hamstring/quad is 1+ to 2/4 Skin: Skin is warm and dry.  Psychiatric: She has a normal mood and affect. Her behavior is normal.    Assessment/Plan: 1. 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 Physiatrist is providing close team supervision and 24 hour management of active medical problems listed below. Physiatrist and rehab team continue to assess barriers to discharge/monitor patient progress toward functional and medical goals. FIM: FIM - Bathing Bathing Steps Patient Completed: Chest;Right Arm;Left Arm;Abdomen;Front perineal area;Right upper leg;Left upper leg;Right lower leg (including foot);Left lower leg (including foot) Bathing: 4: Min-Patient completes 8-9 20f 10 parts or 75+ percent  FIM - Upper Body Dressing/Undressing Upper body dressing/undressing steps patient completed: Thread/unthread right sleeve of pullover shirt/dresss;Thread/unthread left sleeve of pullover shirt/dress;Put head through opening of pull over shirt/dress;Pull shirt over trunk Upper body dressing/undressing: 5: Set-up assist to: Obtain clothing/put away FIM - Lower Body Dressing/Undressing Lower body dressing/undressing steps patient completed: Thread/unthread right underwear leg;Thread/unthread right pants leg Lower body dressing/undressing: 2: Max-Patient completed 25-49% of tasks        FIM - Control and instrumentation engineer Devices: Sliding board Bed/Chair Transfer: 4: Supine > Sit: Min A (steadying Pt. > 75%/lift 1 leg);3: Bed > Chair or W/C: Mod A (lift or lower assist)  FIM - Locomotion: Wheelchair Distance: 150' Locomotion: Wheelchair: 2: Travels 50 - 149 ft with moderate assistance (Pt: 50 - 74%) FIM - Locomotion: Ambulation Locomotion: Ambulation Assistive Devices:  (unable to perform safely) Locomotion: Ambulation: 0: Activity did not occur  Comprehension Comprehension Mode: Auditory Comprehension: 5-Follows basic conversation/direction: With extra time/assistive device  Expression Expression Mode: Verbal Expression: 5-Expresses complex  90% of the time/cues <  10% of the time  Social Interaction Social Interaction: 6-Interacts appropriately with others with medication or extra time (anti-anxiety, antidepressant).  Problem Solving Problem Solving: 5-Solves basic problems: With no assist  Memory Memory: 6-More than reasonable amt of time Medical Problem List and Plan:  Thoracic mass with incomplete paraplegia, s/p resection  1. DVT Prophylaxis/Anticoagulation: Mechanical: Antiembolism stockings, knee (TED hose) Bilateral lower extremities  Sequential compression devices, below knee Bilateral lower extremities  -dopplers negative.  2. Chronic pain due to fibromyalgia as well as back pain with neuropathy/ Pain Management: Use oxycodone 15 mg qid at home as well Lyrica 100 mg tid and cymbalta bid    -follow for activity tolerance 3. Depression with anxiety and panic attacks/Mood: Team to provide ego support. LCSW to follow for evaluation and support. Will continue cymbalta bid, xanax 0.5 mg tid as well as trazodone at bedtime for mood and insomnia.  4.Neuropsych: This patient is capable of making decisions on her own behalf.  5. Severe COPD: Continue oxygen 4 liters--used at home. Continue spiriva and dulera. Complaining of increase in SOB.  -albuterol changed to xopenex neb--add scheduled mucinex.   -sputum culture nl.   -continue IS  -afebrile 6. Bowel and bladder program:   On linzess but ineffective--will need augmentation of bowel program--SSE today. Added miralax daily. Moved bowels over weekend  -regular program    7. Chronic renal failure: Baseline BUN/Cr- 39/1.80 at admission. Near baseline---continue current meds  -BUN/CR close to baseline---recheck next week 8. Diastolic dysfunction with tachycardia (as compensatory for hypoxia): Resumed lasix at 20 mg daily as well as cardizem for BP/ heart rate control. Low salt diet. --need more regular weights 9. Surgical- decadron taper  10. Bladder: I and O cath prn  -can  try low dose flomax---can't use urecholine given COPD  -check urine   LOS (Days) 5 A FACE TO FACE EVALUATION WAS PERFORMED  SWARTZ,ZACHARY T 11/21/2013 9:28 AM

## 2013-11-21 NOTE — Progress Notes (Signed)
Physical Therapy Session Note  Patient Details  Name: [name] MRN: [id number] Date of Birth: 1961/07/10  Today's Date: 11/21/2013 Time: 4097-3532 Time Calculation (min): 41 min   Skilled Therapeutic Interventions/Progress Updates:    Session consisted of pt performing slideboard transfer bed>w/c with Min A (cueing and assistance needed for proper slideboard positioning and for proper anterior weight shift), w/c mobility room<>rehab gym with S (assistance with O2 line management) cueing needed for proper w/c positioning within room, sit to stands x 6 on parallel with Max A- Max A +2 (cueing needed for proper LE activation and for proper hand placement on parallel bars when performing transfer), and pt performing static stance x 6 with Mod A and BUE support on parallel bars (tactile cueing needed to prevent R sided weight shift and verbal cueing for appropriate posture and BLE activation throughout) unable to maintain static stance for more than 30 seconds due to BLE weakness. Due to pt's elevated HR this morning vitals were checked on a few occasions throughout the session with pulse never getting above 108 and SPO2 levels never getting below 93% on 4 L/min of supplemental oxygen.   Therapy Documentation Precautions:  Precautions Precautions: Back;Fall Precaution Comments: Back precautions reviewed (BAT) Restrictions Weight Bearing Restrictions: No Pain:  Pt had no c/o pain throughout the session.  See FIM for current functional status  Therapy/Group: Individual Therapy  Aleksis Jiggetts 11/21/2013, 4:19 PM

## 2013-11-21 NOTE — Progress Notes (Signed)
Occupational Therapy Session Note  Patient Details  Name: [name] MRN: [id number] Date of Birth: [birth year]  Today's Date: 11/21/2013 Time: 0930-1029 Time Calculation (min): 59 min  Short Term Goals: Week 1:  OT Short Term Goal 1 (Week 1): Patient will complete lower body bathing using AE, prn, supine in bed with min A OT Short Term Goal 2 (Week 1): Patient will complete upper body bathing at sink or at edge of bed with setup assist OT Short Term Goal 3 (Week 1): Patient will complete dressing using AE prn, sitting and standing, with mod assist OT Short Term Goal 4 (Week 1): Patient will maintain post-surgical safety precautions 100% of the time during ADL.  Skilled Therapeutic Interventions/Progress Updates: ADL-retraining with focus on lower body bathing, improved use of AE (reacher), energy conservation.   Patient received supine in bed, reporting concern over possible reoccurrence of pneumonia while awaiting scheduled x-ray appointment.  Patient agreed to supine bathing in bed with transfer to w/c, if feasible at second half of session for upper body bathing/dressing at sink side.    HR and BP monitored as per physical therapist, patient exhibited rapid and increased HR with exacerbation of SOB during earlier session.   @ 9:45 a.m., HR 118, BP 118/85, 02 95%.   Patient completed LB bathing supine with setup assist and mod assist to place and hold left leg to facilitate rolling to her right to wash buttocks.   Patient was able to assume crossed-leg posture while long-sitting in bed, w/HOB elevated to approx 30 deg.  Patient required mod assist to reposition and improve sitting posture after lower body bathing 3 times during session as well as 4 cues to rest.  Patient continues to verbalize understanding of energy conservation skills but requires cues to execute strategies throughout session.      Patient left in bed, call light and phone placed within reach.    Therapy  Documentation Precautions:  Precautions Precautions: Back;Fall Precaution Comments: Back precautions reviewed (BAT) Restrictions Weight Bearing Restrictions: No  Vital Signs: Therapy Vitals BP: 130/88 mmHg Oxygen Therapy SpO2: 94 % O2 Device: Nasal cannula O2 Flow Rate (L/min): 4 L/min  Pain: Pain Assessment Pain Assessment: 0-10 Pain Score: 6  Pain Type: Acute pain Pain Location: Back Pain Orientation: Right Pain Intervention(s): Medication (See eMAR);Repositioned;Distraction  ADL: ADL ADL Comments: see FIM   See FIM for current functional status  Therapy/Group: Individual Therapy  Second session: Time: 1130-1200 Time Calculation (min):  30 min  Pain Assessment: 6/10, back, right, with activity; repositioned  Skilled Therapeutic Interventions: ADL-retraining with focus on upper body bathing and dressing, supine in bed, and re-ed on principles on energy conversation.  Patient received supine in bed, reporting discomfort after x-ray procedures.   HR/BP 124/83, 117 bpm, 02 sats 95%.   After min assist to remove shirt and setup assist to provide supplies, patient completed upper body bathing with 3 rest breaks (when cued) and mod assist to perform bed mobility (toward head of bed) due to poor supine postural control with HOB elevated.   Patient verbalized understanding of energy conservation techniques, to include directing assistance and taking frequent rest breaks, but she requires cues to attend to symptoms of SOB.   Patient demo'd good carry-over of pursed-lip breathing strategies throughout session. Patient left in bed, call light and phone within reach.   See FIM for current functional status  Therapy/Group: Individual Therapy  Kyia Rhude 11/21/2013, 10:30 AM

## 2013-11-21 NOTE — Plan of Care (Signed)
Problem: SCI BOWEL ELIMINATION Goal: RH STG MANAGE BOWEL WITH ASSISTANCE STG Manage Bowel with mod Assistance.  Outcome: Not Progressing Requires total assist   Problem: SCI BLADDER ELIMINATION Goal: RH STG MANAGE BLADDER WITH ASSISTANCE STG Manage Bladder With mod Assistance  Outcome: Not Progressing Requires total assist in and out caths every 6 hours . Attempts made up to bedside commode . No urge to void while up on bedside commode .   Problem: RH SKIN INTEGRITY Goal: RH STG MAINTAIN SKIN INTEGRITY WITH ASSISTANCE STG Maintain Skin Integrity With min Assistance.  Outcome: Not Progressing Total assist from staff to turn in bed

## 2013-11-21 NOTE — Progress Notes (Signed)
Physical Therapy Session Note  Patient Details  Name: [name] MRN: [id number] Date of Birth: 1961/08/11  Today's Date: 11/21/2013 Time: 7026-3785 Time Calculation (min): 30 min  Short Term Goals: Week 1:  PT Short Term Goal 1 (Week 1): Pt will be able to transfer bed<>w/c level transfers with min assist.  PT Short Term Goal 2 (Week 1): Pt will propel w/c 200' with supervision PT Short Term Goal 3 (Week 1): Pt will perform sit <> stands from w/c with max assist +1  Skilled Therapeutic Interventions/Progress Updates:    Pt received supine in bed, labored breathing with wheezing noted. Respiratory completed breathing treatment (started PT 15 min late). Pt found to have elevated HR of 128-137 bpm supine at rest. SpO2 94% on 4L  O2. RN called to room, PA contacted. While waiting for nursing and PA, pt performed simple LE exercises which did not effect HR (ankle pumps with passive overstretch of Lt heel cord; quad sets; SLR - AA for Lt LE; glute squeezes). Pt encouraged to leave bed in chair position to promote improved lung function, encouraged to utilize inspirometer as much as possible.   General: Amount of Missed PT Time (min): 30 Minutes Missed Time Reason: Nursing care;Patient ill (comment) (elevated HR)   Therapy Documentation Precautions:  Precautions Precautions: Back;Fall Precaution Comments: Back precautions reviewed (BAT) Restrictions Weight Bearing Restrictions: No Pain: Lt. Lower rib pain, RN and PA made aware  See FIM for current functional status  Therapy/Group: Individual Therapy  Charlita, Brian 11/21/2013, 12:33 PM

## 2013-11-21 NOTE — Progress Notes (Signed)
Reviewed and in agreement with treatment provided.  

## 2013-11-22 ENCOUNTER — Inpatient Hospital Stay (HOSPITAL_COMMUNITY): Payer: Medicare Other | Admitting: Physical Therapy

## 2013-11-22 ENCOUNTER — Inpatient Hospital Stay (HOSPITAL_COMMUNITY): Payer: Medicare Other

## 2013-11-22 ENCOUNTER — Inpatient Hospital Stay (HOSPITAL_COMMUNITY): Payer: Medicare Other | Admitting: Occupational Therapy

## 2013-11-22 DIAGNOSIS — F341 Dysthymic disorder: Secondary | ICD-10-CM

## 2013-11-22 DIAGNOSIS — D497 Neoplasm of unspecified behavior of endocrine glands and other parts of nervous system: Secondary | ICD-10-CM

## 2013-11-22 DIAGNOSIS — N189 Chronic kidney disease, unspecified: Secondary | ICD-10-CM

## 2013-11-22 DIAGNOSIS — J449 Chronic obstructive pulmonary disease, unspecified: Secondary | ICD-10-CM

## 2013-11-22 DIAGNOSIS — IMO0001 Reserved for inherently not codable concepts without codable children: Secondary | ICD-10-CM

## 2013-11-22 MED ORDER — DM-GUAIFENESIN ER 30-600 MG PO TB12
1.0000 | ORAL_TABLET | Freq: Two times a day (BID) | ORAL | Status: DC
  Administered 2013-11-22 – 2013-12-09 (×35): 1 via ORAL
  Filled 2013-11-22 (×39): qty 1

## 2013-11-22 MED ORDER — MUSCLE RUB 10-15 % EX CREA
TOPICAL_CREAM | CUTANEOUS | Status: DC | PRN
  Administered 2013-11-22: 21:00:00 via TOPICAL
  Filled 2013-11-22: qty 85

## 2013-11-22 NOTE — Patient Care Conference (Signed)
Inpatient RehabilitationTeam Conference and Plan of Care Update Date: 11/22/2013   Time: 9:30 AM    Patient Name: [name]      Medical Record Number: [id number]  Date of Birth: 02/18/1961 Sex: Female         Room/Bed: 4W18C/4W18C-01 Payor Info: Payor: MEDICARE / Plan: MEDICARE PART A AND B / Product Type: *No Product type* /    Admitting Diagnosis: resection epidural mass  Admit Date/Time:  11/16/2013  4:05 PM Admission Comments: No comment available   Primary Diagnosis:  <principal problem not specified> Principal Problem: <principal problem not specified>  Patient Active Problem List   Diagnosis Date Noted  . Thoracic spine tumor 11/16/2013  . Urinary retention 11/12/2013  . Left leg weakness 11/12/2013  . 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 Dyspnea 10/18/2013  . Right upper lobe pneumonia/infiltrate 09/20/2013  . Lichen planus vulva 26/94/8546  . Thoracic compression fracture 09/20/2013  . COPD exacerbation 09/19/2013  . Acute delirium 09/07/2013  . Chronic diastolic congestive heart failure, NYHA class 1 09/07/2013  . Hypernatremia 09/06/2013  . Shingles 09/06/2013  . History of depression, chronic antidepressants 09/06/2013  . Physical deconditioning 09/06/2013  . Hypercarbia, acute (resolved) on chronic 09/02/2013  . Acute-on-chronic respiratory failure 09/02/2013  . 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 HYPERTRIGLYCERIDEMIA 09/16/2006  . History of anxiety - chronic alprazolam 06/10/2006  . TOBACCO ABUSE 06/10/2006  . COPD without acute bronchospasm 06/10/2006  . PULMONARY NODULE 06/10/2006  . GERD, chronic PPI use 06/10/2006  . LOW BACK PAIN 06/10/2006  . FIBROMYALGIA 06/10/2006  . SLEEP APNEA 06/10/2006  . CERVICAL CANCER, HX OF 06/10/2006  . HYSTERECTOMY, HX OF 06/10/2006    Expected Discharge Date: Expected Discharge Date: 12/07/13  Team Members Present: Physician leading conference: Dr. Alger Simons Social Worker Present: Alfonse Alpers, LCSW Nurse Present: Heather Roberts, RN PT Present: Canary Brim, Rayburn Ma, PT OT Present: Chrys Racer, Starling Manns, OT;Frank Califon, OT PPS Coordinator present : Ileana Ladd, PT     Current Status/Progress Goal Weekly Team Focus  Medical   thoracic tumor s/p resection with incomplete myelopathy, severe COPD  maximize pulmonary function, bladder emptying  pain mgt, pulmonary mgt, fluid balance   Bowel/Bladder   In and out cath q8hrs, no urge to void, cont of bowel. LBM 11/19/13  To develop the urge to void  Time toileting during the day    Swallow/Nutrition/ Hydration             ADL's   Min A UB ADL, Mod A LB ADL, Mod A sliding board transfers, Max A toileting  Overall Min A  Energy conservation during ADL, adapted LB ADL, transfers, AE training, dynamic sitting balance, toileting skills   Mobility   mod assist sliding board transfer, Max/+2 for standing   supervision/min assist w/c level  strengthening, endurance, standing tolerance, sliding board transfers, w/c mobility and parts management.    Communication             Safety/Cognition/ Behavioral Observations            Pain   No c/o pain   Pain <3  Assess for and treat pain q shift, medicate with prn meds   Skin   White cluster of blister in perinum area, bottom  No skin breakdown  Assess skin for breakdown q shift    Rehab Goals Patient on target to meet rehab goals: Yes Rehab Goals Revised: This is patient's first  team conference and goals were set for min assist to supervision. *See Care Plan and progress notes for long and short-term goals.  Barriers to Discharge: premorbid deficits, level of injury    Possible Resolutions to Barriers:  family ed, NMR, adaptive equipment    Discharge Planning/Teaching Needs:  Pt plans to go home with her husband after d/c from [facility].  Husband can attend family education, as needed.   Team Discussion:  Pt is a  hardworker and sometimes needs to be cued to take rest breaks.  Transfers are hard for her, but she is making progress.  Pt continues to struggle with spasticity, myopathy, weakness, COPD, pain issues, and urine retention.  Pt has ambulation goals, but she will probably be more w/c level.  Husband is working on a ramp at home.  Team would like for husband to come in for family education.  Revisions to Treatment Plan:  None, as this is pt's first conference   Continued Need for Acute Rehabilitation Level of Care: The patient requires daily medical management by a physician with specialized training in physical medicine and rehabilitation for the following conditions: Daily direction of a multidisciplinary physical rehabilitation program to ensure safe treatment while eliciting the highest outcome that is of practical value to the patient.: Yes Daily medical management of patient stability for increased activity during participation in an intensive rehabilitation regime.: Yes Daily analysis of laboratory values and/or radiology reports with any subsequent need for medication adjustment of medical intervention for : Pulmonary problems;Post surgical problems;Neurological problems;Cardiac problems  Rebeckah Masih, Silvestre Mesi 11/22/2013, 11:03 AM

## 2013-11-22 NOTE — Progress Notes (Signed)
Occupational Therapy Session Note  Patient Details  Name: [name] MRN: [id number] Date of Birth: Apr 16, 1961  Today's Date: 11/22/2013 Time: 4967-5916 Time Calculation (min): 30 min  Short Term Goals: Week 1:  OT Short Term Goal 1 (Week 1): Patient will complete lower body bathing using AE, prn, supine in bed with min A OT Short Term Goal 2 (Week 1): Patient will complete upper body bathing at sink or at edge of bed with setup assist OT Short Term Goal 3 (Week 1): Patient will complete dressing using AE prn, sitting and standing, with mod assist OT Short Term Goal 4 (Week 1): Patient will maintain post-surgical safety precautions 100% of the time during ADL.  Skilled Therapeutic Interventions/Progress Updates:  Patient resting in w/c upon arrival and visiting with family.  Per patient request, allowed for 15 min visit secondary to schedules allowed.  Upon return, engaged in w/c>drop arm commode>bed transfers via short sliding board with assist from +2 to stabilize w/c, BSC and sliding board.  Focused session on correct sliding board placement, technique for scooting on the board by leaning slightly forward and away from the direction she is traveling.  Patient reported that the transfers went much smoother than any other times she has transferred.  We reviewed the need to always have fabric between her skin and the board (can be either pants or a bed pad) as well as the importance of proper board placement and the head to hip relationship technique while scooting on the board.  Patient in bed upon completion with family visiting.  Therapy Documentation Precautions:  Precautions Precautions: Back;Fall Precaution Comments: Back precautions reviewed (BAT) Restrictions Weight Bearing Restrictions: No Pain: Denies pain See FIM for current functional status  Therapy/Group: Individual Therapy  Bridie Colquhoun 11/22/2013, 4:39 PM

## 2013-11-22 NOTE — Progress Notes (Signed)
Physical Therapy Session Note  Patient Details  Name: [name] MRN: [id number] Date of Birth: [birth year]  Today's Date: 11/22/2013 Time: 0830-0930 Time Calculation (min): 60 min  Short Term Goals: Week 1:  PT Short Term Goal 1 (Week 1): Pt will be able to transfer bed<>w/c level transfers with min assist.  PT Short Term Goal 2 (Week 1): Pt will propel w/c 200' with supervision PT Short Term Goal 3 (Week 1): Pt will perform sit <> stands from w/c with max assist +1  Skilled Therapeutic Interventions/Progress Updates:    Sliding board transfer bed>w/c with min/mod assist with PT providing frequent cues for bil LE placement/advancement, anterior trunk lean, UE placement. Pt continues to need assist for board placement and removal.   Multiple sit <> stands in parallel bars performed with max assist (bil LEs blocked and sheet utilized behind buttocks). Mini squats x 8 reps with mod/max assist.   Ambulation in parallel bars with +2 assist, "super sliders" under forefoot to decrease friction and improve foot advancement. Pt able to take ~4-6 steps at a time. SpO2 down to 86% on 4L [place] O2, HR up to 131 with activity. Resting SPO2 94%, HR 104 bpm. Pt requires frequent rest breaks to recover cardiovascularly.   Therapy Documentation Precautions:  Precautions Precautions: Back;Fall Precaution Comments: Back precautions reviewed (BAT) Restrictions Weight Bearing Restrictions: No Pain: Some discomfort in lower left ribs/abdomen - not rated. Declined medication request.   See FIM for current functional status  Therapy/Group: Individual Therapy  Anh, Bigos 11/22/2013, 11:17 AM

## 2013-11-22 NOTE — Progress Notes (Signed)
Social Work Patient ID: [name], female   DOB: 10-Jan-1961, 53 y.o.   MRN: [id number]  CSW met with pt and her husband to update them on team conference discussion.  Pt is scheduled for d/c on 12-07-13 and they were pleased.  Husband stated that he is ready for pt to be home and he maintains that he can provide physical care for her.  Explained that therapists would like to start some family education with him and he can come on Friday at [place] for that.  CSW informed team and they will schedule accordingly.  Pt's husband will stay for 2:15pm OT session today, as well.  Husband is aware that pt will require minimal assistance for many tasks.  Husband is working with landlord to see if they will install a ramp for pt.  If not, he will have some friends build it next week.  Either way, they will have it built by the time pt comes home.  Explained that pt will most likely be at w/c level, so this is especially important.  Husband expressed understanding.

## 2013-11-22 NOTE — Progress Notes (Signed)
Occupational Therapy Session Note  Patient Details  Name: [name] MRN: [id number] Date of Birth: 22-Dec-1960  Today's Date: 11/22/2013 Time: 0830-0930 Time Calculation (min): 60 min  Short Term Goals: Week 1:  OT Short Term Goal 1 (Week 1): Patient will complete lower body bathing using AE, prn, supine in bed with min A OT Short Term Goal 2 (Week 1): Patient will complete upper body bathing at sink or at edge of bed with setup assist OT Short Term Goal 3 (Week 1): Patient will complete dressing using AE prn, sitting and standing, with mod assist OT Short Term Goal 4 (Week 1): Patient will maintain post-surgical safety precautions 100% of the time during ADL.  Skilled Therapeutic Interventions/Progress Updates: ADL-retraining with focus on improved lower body bathing, transfers, bed mobility, energy conservation.  Patient received in w/c awaiting ADL and reporting left shoulder pain.  RN notified and advised on discussion with patient regarding use of muscle rub for shoulder pain.   Patient required only setup assist for upper body bathing and dressing this session, with 1 rest break.   Patient continues to require +2 assist for standing balance therefore LB ADL performed supine in bed, after slide board transfer (max assist).   Patient ineffective with slide board transfer d/t incline from w/c to bed, motor planning deficit of lifting buttocks off board and attempting to pull versus slide, and SOB.   After 1 rest break, patient performed lower body bathing with only setup assist but requires total assist to lace underwear and pants and to pull pants over hips while rolling to right and left.  Patient left in bed at end of session, requiring rest break to recover, with call light and phone within reach.    Therapy Documentation Precautions:  Precautions Precautions: Back;Fall Precaution Comments: Back precautions reviewed (BAT) Restrictions Weight Bearing Restrictions: No  Vital  Signs: Oxygen Therapy O2 Device: Nasal cannula O2 Flow Rate (L/min): 4 L/min  Pain: Pain Assessment Pain Score: 6  Pain Location: Shoulder Pain Orientation: left;Posterior Pain Descriptors / Indicators: Jabbing Pain Frequency: Intermittent Pain Onset: With Activity Patients Stated Pain Goal: 0 Pain Intervention(s): MD notified (Comment);RN made aware;Repositioned;Distraction  ADL: ADL ADL Comments: see FIM  See FIM for current functional status  Therapy/Group: Individual Therapy  Second session: Time: 1415-1500 Time Calculation (min):  45 min  Pain Assessment: No/denies pain  Skilled Therapeutic Interventions:  ADL-retraining with focus on family ed with patient's husband Ron and briefly with sister-in-law Tammy.   Patient received in w/c with her husband seated in recliner using portable oxygen (2L).   Husband was educated on patient's performance deficits with ADL which included demonstration on how she completes bed mobility and slide board transfer using [employer].   Beezy board transfer from w/c to EOB required min assist after setup with max assist to lift both legs into bed for bed mobility.   During transfer out of bed, patient was unaware of BLE position and required mod assist to reposition and maintain sitting balance.   Husband reports that he is able to provide needed assist with ADL and transfers at patient's current functional level although patient is hoping to reduce use of DME and AE, if possible.  See FIM for current functional status  Therapy/Group: Individual Therapy  Paddy Walthall 11/22/2013, 9:42 AM

## 2013-11-22 NOTE — Progress Notes (Addendum)
[facility]     PROGRESS NOTE    Subjective/Complaints: Still some coughing, phlegm looser today A 12 point review of systems has been performed and if not noted above is otherwise negative.   Objective: Vital Signs: Blood pressure 120/72, pulse 99, temperature 98.1 F (36.7 C), temperature source Oral, resp. rate 18, height 5\' 2"  (1.575 m), weight 72.2 kg (159 lb 2.8 oz), SpO2 96.00%. Dg Chest 2v Repeat Same Day  11/21/2013   CLINICAL DATA:  Left-sided chest pain.  Wheezing.  EXAM: CHEST - 2 VIEW SAME DAY  COMPARISON:  11/16/2013  FINDINGS: There are coarse reticular opacities at the lung bases that are without change. This is likely combination scarring atelectasis. Infiltrate is not excluded.  No new lung opacities.  No pleural effusion or pneumothorax.  Cardiac silhouette is normal in size. Normal mediastinal and hilar contours.  There are compression fractures of the thoracic spine, stable from the most recent prior exam. Bony thorax is diffusely demineralized.  IMPRESSION: 1. No change from the most recent prior chest with that. 2. Lung base opacity is may be due to a combination of atelectasis and scarring. Infiltrate is possible. No pulmonary edema.   Electronically Signed   By: Lajean Manes M.D.   On: 11/21/2013 11:55   No results found for this basename: WBC, HGB, HCT, PLT,  in the last 72 hours No results found for this basename: NA, K, CL, CO, GLUCOSE, BUN, CREATININE, CALCIUM,  in the last 72 hours CBG (last 3)  No results found for this basename: GLUCAP,  in the last 72 hours  Wt Readings from Last 3 Encounters:  11/21/13 72.2 kg (159 lb 2.8 oz)  11/14/13 73.755 kg (162 lb 9.6 oz)  11/14/13 73.755 kg (162 lb 9.6 oz)    Physical Exam:  Constitutional: She is oriented to person, place, and time. She appears well-developed and well-nourished.  HENT: dentition fair. Oral mucosa pink/moist  Head: Normocephalic and atraumatic.  Eyes:  Conjunctivae are normal.  Cardiovascular: Normal rate and regular rhythm. No murmur  Respiratory: No respiratory distress. She has less wheezes more rhonchi and scattered secretions today GI: Soft. Bowel sounds are normal. She exhibits no distension. There is no tenderness.  Musculoskeletal: She exhibits no edema.  Left leg shorter than right. ?1/2 inch  Neurological: She is alert and oriented to person, place, and time.  Paraparesis with sensory deficits. DTR absent BLE. LLE tr to 1/5 HF,KE. Tr at ankle. RLE is 1+hf, 2-ke, 3 ankle. Sensation 1/2 LLE 1+ RLE. T8 Sensory level. Tone left hamstring/quad is 1+ to 2/4 Skin: Skin is warm and dry.  Psychiatric: She has a normal mood and affect. Her behavior is normal.    Assessment/Plan: 1. 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 Physiatrist is providing close team supervision and 24 hour management of active medical problems listed below. Physiatrist and rehab team continue to assess barriers to discharge/monitor patient progress toward functional and medical goals. FIM: FIM - Bathing Bathing Steps Patient Completed: Chest;Right Arm;Left Arm;Abdomen;Front perineal area;Right upper leg;Left upper leg;Right lower leg (including foot);Left lower leg (including foot) Bathing: 4: Min-Patient completes 8-9 59f 10 parts or 75+ percent  FIM - Upper Body Dressing/Undressing Upper body dressing/undressing steps patient completed: Thread/unthread right sleeve of pullover shirt/dresss;Thread/unthread left sleeve of pullover shirt/dress;Put head through opening of pull over shirt/dress;Pull shirt over trunk  Upper body dressing/undressing: 5: Set-up assist to: Obtain clothing/put away FIM - Lower Body Dressing/Undressing Lower body dressing/undressing steps patient completed: Thread/unthread right underwear  leg;Thread/unthread right pants leg Lower body dressing/undressing: 2: Max-Patient completed 25-49% of tasks  FIM - Toileting Toileting: 1: Two helpers (per Phebe Colla, NT)  FIM - Toilet Transfers Toilet Transfers Assistive Devices: Bedside commode Toilet Transfers: 1-Mechanical lift;1-Two helpers  FIM - Control and instrumentation engineer Devices: Sliding board;Bed rails;Arm rests;HOB elevated (leg lifter with bed mobility) Bed/Chair Transfer: 2: Supine > Sit: Max A (lifting assist/Pt. 25-49%);2: Sit > Supine: Max A (lifting assist/Pt. 25-49%);1: Mechanical lift;1: Two helpers  FIM - Locomotion: Wheelchair Distance: 150' Locomotion: Wheelchair: 5: Travels 150 ft or more: maneuvers on rugs and over door sills with supervision, cueing or coaxing (assistance with O2 line management) FIM - Locomotion: Ambulation Locomotion: Ambulation Assistive Devices:  (unable to perform safely) Locomotion: Ambulation: 0: Activity did not occur  Comprehension Comprehension Mode: Auditory Comprehension: 5-Follows basic conversation/direction: With extra time/assistive device  Expression Expression Mode: Verbal Expression: 5-Expresses complex 90% of the time/cues < 10% of the time  Social Interaction Social Interaction: 6-Interacts appropriately with others with medication or extra time (anti-anxiety, antidepressant).  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 DVT Prophylaxis/Anticoagulation: Mechanical: Antiembolism stockings, knee (TED hose) Bilateral lower extremities  Sequential compression devices, below knee Bilateral lower extremities  -dopplers negative.  2. Chronic pain due to fibromyalgia as well as back pain with neuropathy/ Pain Management: Use oxycodone 15 mg  qid at home as well Lyrica 100 mg tid and cymbalta bid    -follow for activity tolerance 3. Depression with anxiety and panic attacks/Mood: Team to provide ego support. LCSW to follow for evaluation and support. Will continue cymbalta bid, xanax 0.5 mg tid as well as trazodone at bedtime for mood and insomnia.  4.Neuropsych: This patient is capable of making decisions on her own behalf.  5. Severe COPD: Continue oxygen 4 liters--used at home. Continue spiriva and dulera. Complaining of increase in SOB.  -albuterol changed to xopenex neb--add scheduled mucinex.   -sputum culture nl.   -continue IS, FV, follow up cxr without change  -afebrile 6. Bowel and bladder program:   On linzess but ineffective--will need augmentation of bowel program--SSE today. Added miralax daily. Moved bowels over weekend  -regular program    7. Chronic renal failure: Baseline BUN/Cr- 39/1.80 at admission. Near baseline---continue current meds  -BUN/CR close to baseline---recheck next week 8. Diastolic dysfunction with tachycardia (as compensatory for hypoxia): Resumed lasix at 20 mg daily as well as cardizem for BP/ heart rate control. Low salt diet. --need more regular weights  -hold lasix for now given bladder issues 9. Surgical- decadron taper  10. Bladder: I and O cath prn  -low dose flomax  -UA neg, culture pending   LOS (Days) 6 A FACE TO FACE EVALUATION WAS PERFORMED  Fredrich Cory T 11/22/2013 9:12 AM

## 2013-11-23 ENCOUNTER — Inpatient Hospital Stay (HOSPITAL_COMMUNITY): Payer: Medicare Other | Admitting: Physical Therapy

## 2013-11-23 ENCOUNTER — Inpatient Hospital Stay (HOSPITAL_COMMUNITY): Payer: Medicare Other

## 2013-11-23 DIAGNOSIS — IMO0001 Reserved for inherently not codable concepts without codable children: Secondary | ICD-10-CM

## 2013-11-23 DIAGNOSIS — J449 Chronic obstructive pulmonary disease, unspecified: Secondary | ICD-10-CM

## 2013-11-23 DIAGNOSIS — F341 Dysthymic disorder: Secondary | ICD-10-CM

## 2013-11-23 DIAGNOSIS — D497 Neoplasm of unspecified behavior of endocrine glands and other parts of nervous system: Secondary | ICD-10-CM

## 2013-11-23 DIAGNOSIS — N189 Chronic kidney disease, unspecified: Secondary | ICD-10-CM

## 2013-11-23 MED ORDER — LEVALBUTEROL HCL 0.63 MG/3ML IN NEBU
0.6300 mg | INHALATION_SOLUTION | Freq: Three times a day (TID) | RESPIRATORY_TRACT | Status: DC
  Administered 2013-11-23 – 2013-12-04 (×29): 0.63 mg via RESPIRATORY_TRACT
  Filled 2013-11-23 (×55): qty 3

## 2013-11-23 NOTE — Progress Notes (Signed)
Physical Therapy Session Note  Patient Details  Name: MIZUKI HOEL MRN: [id number] Date of Birth: 22-Jun-1961  Today's Date: 11/23/2013 Time: 7106-2694 Time Calculation (min): 45 min  Short Term Goals: Week 2:  PT Short Term Goal 1 (Week 2): Pt will perform sliding board transer to slightly elevated surface with mod assist.  PT Short Term Goal 2 (Week 2): Pt will be able to self-direct w/c set-up and sliding board placement for sliding board transfers w/c to/from bed or mat.  PT Short Term Goal 3 (Week 2): Pt will demonstrate appropriate pressure relief program when seated in w/c.   Skilled Therapeutic Interventions/Progress Updates:    Pt motivated for therapy. Supine>sit supervision with railing. Discussed use of hospital bed at home due to paraparesis weakness and benefits of having elevating HOB for COPD. Pt would like to try the bed, reports will fit in house, but fearful of uncomfortable mattress.  - Sliding board transfer (level) with min assist, PT facilitating through knees to keep hips posterior and avoid anterior slide off board. Cues for LE readjustment with each scoot. - Sit <> stands (x6 bouts) in parallel bars max assist to promote BIl LE ROM and functional contribution during transfers, pt unable to attempt lateral weight shifting or attempted steps due to increased fatigue. Pt heavily reliant on UEs. - W/C propulsion for endurance x 200', 120' supervision - Pt encouraged to set daily goals for self.  SpO2/HR at lowest with activity: 87%, 113 bpm; Resting: 93%, 95 bpm. Pt maintained on 4L [place] O2 throughout session.     Therapy Documentation Precautions:  Precautions Precautions: Back;Fall Precaution Comments: Back precautions reviewed (BAT) Restrictions Weight Bearing Restrictions: No Pain:  no c/o pain  See FIM for current functional status  Therapy/Group: Individual Therapy  Rickeya, Manus 11/23/2013, 3:29 PM

## 2013-11-23 NOTE — Progress Notes (Signed)
Physical Therapy Weekly Progress Note  Patient Details  Name: [name] MRN: [id number] Date of Birth: [birth year]  Today's Date: 11/23/2013 Time: 5686-1683 Time Calculation (min): 50 min  Patient has met 3 of 3 short term goals.  Pt is currently performing level sliding board transfers at min/mod assist, "uphill" transfers mod/max assist.   Patient continues to demonstrate the following deficits: decreased cardiorespiratory endurance, decreased endurance, paraparesis, elevated need for assist with transfers and therefore will continue to benefit from skilled PT intervention to enhance overall performance with activity tolerance, balance, postural control, ability to compensate for deficits, functional use of  right lower extremity and left lower extremity and knowledge of precautions.  Patient progressing toward long term goals..  Continue plan of care.  PT Short Term Goals Week 1:  PT Short Term Goal 1 (Week 1): Pt will be able to transfer bed<>w/c level transfers with min assist.  PT Short Term Goal 1 - Progress (Week 1): Met PT Short Term Goal 2 (Week 1): Pt will propel w/c 200' with supervision PT Short Term Goal 2 - Progress (Week 1): Met PT Short Term Goal 3 (Week 1): Pt will perform sit <> stands from w/c with max assist +1 PT Short Term Goal 3 - Progress (Week 1): Met  Skilled Therapeutic Interventions/Progress Updates:    Session focused on sliding board transfers to elevated mat then from low mat> w/c. Performed x 2 reps each. Pt self-directing w/c set-up and sliding board placement with mod verbal cues from PT. Pt required mod to max assist with one instance of +2 assist needed for final aspect of transfer from w/c to elevated mat. W/c propulsion >200' on unit supervision. Reviewed pressure relief - pt demonstrated lateral leans.   Therapy Documentation Precautions:  Precautions Precautions: Back;Fall Precaution Comments: Back precautions reviewed  (BAT) Restrictions Weight Bearing Restrictions: No General: Amount of Missed PT Time (min): 10 Minutes Missed Time Reason: Nursing care (breathing treatment; RN to request change of scheduled treatments) Vital Signs: Oxygen Therapy O2 Device: Nasal cannula O2 Flow Rate (L/min): 4 L/min Pain: Pain Assessment Pain Assessment: No/denies pain  See FIM for current functional status  Therapy/Group: Individual Therapy  [name], [name] 11/23/2013, 9:43 AM

## 2013-11-23 NOTE — Progress Notes (Signed)
RT Note: Neb treatment times changed to coincide with therapy.

## 2013-11-23 NOTE — Progress Notes (Signed)
[place] PHYSICAL MEDICINE & REHABILITATION     PROGRESS NOTE    Subjective/Complaints: Frustrated about voiding. Pain under control. Breathing a little better. Working hard with therapy A 12 point review of systems has been performed and if not noted above is otherwise negative.   Objective: Vital Signs: Blood pressure 138/84, pulse 102, temperature 98 F (36.7 C), temperature source Oral, resp. rate 18, height 5\' 2"  (1.575 m), weight 70.2 kg (154 lb 12.2 oz), SpO2 98.00%. Dg Chest 2v Repeat Same Day  11/21/2013   CLINICAL DATA:  Left-sided chest pain.  Wheezing.  EXAM: CHEST - 2 VIEW SAME DAY  COMPARISON:  11/16/2013  FINDINGS: There are coarse reticular opacities at the lung bases that are without change. This is likely combination scarring atelectasis. Infiltrate is not excluded.  No new lung opacities.  No pleural effusion or pneumothorax.  Cardiac silhouette is normal in size. Normal mediastinal and hilar contours.  There are compression fractures of the thoracic spine, stable from the most recent prior exam. Bony thorax is diffusely demineralized.  IMPRESSION: 1. No change from the most recent prior chest with that. 2. Lung base opacity is may be due to a combination of atelectasis and scarring. Infiltrate is possible. No pulmonary edema.   Electronically Signed   By: Lajean Manes M.D.   On: 11/21/2013 11:55   No results found for this basename: WBC, HGB, HCT, PLT,  in the last 72 hours No results found for this basename: NA, K, CL, CO, GLUCOSE, BUN, CREATININE, CALCIUM,  in the last 72 hours CBG (last 3)  No results found for this basename: GLUCAP,  in the last 72 hours  Wt Readings from Last 3 Encounters:  11/23/13 70.2 kg (154 lb 12.2 oz)  11/14/13 73.755 kg (162 lb 9.6 oz)  11/14/13 73.755 kg (162 lb 9.6 oz)    Physical Exam:  Constitutional: She is oriented to person, place, and time. She appears well-developed and well-nourished.  HENT: dentition fair. Oral mucosa  pink/moist  Head: Normocephalic and atraumatic.  Eyes: Conjunctivae are normal.  Cardiovascular: Normal rate and regular rhythm. No murmur  Respiratory: No respiratory distress. She has less wheezes more rhonchi and scattered secretions today GI: Soft. Bowel sounds are normal. She exhibits no distension. There is no tenderness.  Musculoskeletal: She exhibits no edema.  Left leg shorter than right. ?1/2 inch  Neurological: She is alert and oriented to person, place, and time.  Paraparesis with sensory deficits. DTR absent BLE. LLE tr to 1/5 HF,KE. Tr at ankle. RLE is 1+hf, 2-ke, 3 ankle. Sensation 1/2 LLE 1+ RLE. T8 Sensory level. Tone left hamstring/quad is 1+ /4---showing slow improvement Skin: Skin is warm and dry.  Psychiatric: She has a normal mood and affect. Her behavior is normal.    Assessment/Plan: 1. 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 Physiatrist is providing close team supervision and 24 hour management of active medical problems listed below. Physiatrist and rehab team continue to assess barriers to discharge/monitor patient progress toward functional and medical goals. FIM: FIM - Bathing Bathing Steps Patient Completed: Chest;Right Arm;Left Arm;Abdomen;Front perineal area;Buttocks;Right upper leg;Left upper leg;Right lower leg (including foot);Left lower leg (including foot) Bathing: 5: Set-up assist to: Adjust water temp (using wheeled shower chair)  FIM - Upper Body Dressing/Undressing Upper body dressing/undressing steps patient completed: Thread/unthread right sleeve of pullover shirt/dresss;Thread/unthread left sleeve of pullover shirt/dress;Put  head through opening of pull over shirt/dress;Pull shirt over trunk Upper body dressing/undressing: 0: Wears gown/pajamas-no public clothing FIM - Lower Body  Dressing/Undressing Lower body dressing/undressing steps patient completed: Thread/unthread right underwear leg;Thread/unthread right pants leg Lower body dressing/undressing: 0: Wears Interior and spatial designer  FIM - Toileting Toileting: 1: Two helpers (per Phebe Colla, NT)  FIM - Toilet Transfers Toilet Transfers Assistive Devices: Bedside commode;Sliding board (drop arm and short wood sliding board) Toilet Transfers: 3-To toilet/BSC: Mod A (lift or lower assist);3-From toilet/BSC: Mod A (lift or lower assist);1-Two helpers (+2 to stead board and DME)  FIM - Control and instrumentation engineer Devices: Bed rails;HOB elevated;Sliding board Bed/Chair Transfer: 4: Supine > Sit: Min A (steadying Pt. > 75%/lift 1 leg);3: Bed > Chair or W/C: Mod A (lift or lower assist);2: Chair or W/C > Bed: Max A (lift and lower assist)  FIM - Locomotion: Wheelchair Distance: 150' Locomotion: Wheelchair: 5: Travels 150 ft or more: maneuvers on rugs and over door sills with supervision, cueing or coaxing (assistance with O2 line management) FIM - Locomotion: Ambulation Locomotion: Ambulation Assistive Devices:  (unable to perform safely) Locomotion: Ambulation: 0: Activity did not occur  Comprehension Comprehension Mode: Auditory Comprehension: 5-Understands basic 90% of the time/requires cueing < 10% of the time  Expression Expression Mode: Verbal Expression: 5-Expresses complex 90% of the time/cues < 10% of the time  Social Interaction Social Interaction: 6-Interacts appropriately with others with medication or extra time (anti-anxiety, antidepressant).  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 DVT  Prophylaxis/Anticoagulation: Mechanical: Antiembolism stockings, knee (TED hose) Bilateral lower extremities  Sequential compression devices, below knee Bilateral lower extremities  -dopplers negative.  2. Chronic pain due to fibromyalgia as well as back pain with neuropathy/ Pain Management: Use oxycodone 15 mg qid at home as well Lyrica 100 mg tid and cymbalta bid    -follow for activity tolerance 3. Depression with anxiety and panic attacks/Mood: Team to provide ego support. LCSW to follow for evaluation and support. Will continue cymbalta bid, xanax 0.5 mg tid as well as trazodone at bedtime for mood and insomnia.  4.Neuropsych: This patient is capable of making decisions on her own behalf.  5. Severe COPD: Continue oxygen 4 liters--used at home. Continue spiriva and dulera. Complaining of increase in SOB.  -albuterol changed to xopenex neb--added scheduled mucinex which has helped somewhat.   -sputum culture nl.   -continue IS, FV, follow up cxr without change  -afebrile 6. Bowel and bladder program:   On linzess but ineffective--will need augmentation of bowel program--SSE today. Added miralax daily. Moved bowels over weekend  -regular program    7. Chronic renal failure: Baseline BUN/Cr- 39/1.80 at admission. Near baseline---continue current meds  -BUN/CR close to baseline---recheck next week 8. Diastolic dysfunction with tachycardia (as compensatory for hypoxia): Resumed lasix at 20 mg daily as well as cardizem for BP/ heart rate control. Low salt diet. --need more regular weights  -holding lasix for now given bladder issues 9. Surgical- decadron taper slow (given COPD) 10. Bladder: I and O cath prn/ voiding trial  -continue low dose flomax  -UA neg, culture still pending   LOS (Days) 7 A FACE TO FACE EVALUATION WAS PERFORMED  SWARTZ,ZACHARY T 11/23/2013 9:24 AM

## 2013-11-23 NOTE — Progress Notes (Signed)
Social Work Patient ID: [name], female   DOB: [birth year], 53 y.o.   MRN: [id number]  Silvestre Mesi Chevi Lim, LCSW Social Worker Signed  Patient Care Conference Service date: 11/22/2013 11:03 AM  Inpatient RehabilitationTeam Conference and Plan of Care Update Date: 11/22/2013   Time: 9:30 AM     Patient Name: [name]       Medical Record Number: [id number]   Date of Birth: May 21, 1961 Sex: Female         Room/Bed: 4W18C/4W18C-01 Payor Info: Payor: MEDICARE / Plan: MEDICARE PART A AND B / Product Type: *No Product type* /   Admitting Diagnosis: resection epidural mass   Admit Date/Time:  11/16/2013  4:05 PM Admission Comments: No comment available   Primary Diagnosis:  <principal problem not specified> Principal Problem: <principal problem not specified>    Patient Active Problem List     Diagnosis  Date Noted   .  Thoracic spine tumor  11/16/2013   .  Urinary retention  11/12/2013   .  Left leg weakness  11/12/2013   .  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  Dyspnea  10/18/2013   .  Right upper lobe pneumonia/infiltrate  09/20/2013   .  Lichen planus vulva  09/20/2013   .  Thoracic compression fracture  09/20/2013   .  COPD exacerbation  09/19/2013   .  Acute delirium  09/07/2013   .  Chronic diastolic congestive heart failure, NYHA class 1  09/07/2013   .  Hypernatremia  09/06/2013   .  Shingles  09/06/2013   .  History of depression, chronic antidepressants  09/06/2013   .  Physical deconditioning  09/06/2013   .  Hypercarbia, acute (resolved) on chronic  09/02/2013   .  Acute-on-chronic respiratory failure  09/02/2013   .  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  HYPERTRIGLYCERIDEMIA  09/16/2006   .  History of anxiety - chronic alprazolam  06/10/2006   .  TOBACCO ABUSE  06/10/2006   .  COPD without acute bronchospasm  06/10/2006   .  PULMONARY NODULE  06/10/2006   .  GERD, chronic PPI use  06/10/2006   .  LOW  BACK PAIN  06/10/2006   .  FIBROMYALGIA  06/10/2006   .  SLEEP APNEA  06/10/2006   .  CERVICAL CANCER, HX OF  06/10/2006   .  HYSTERECTOMY, HX OF  06/10/2006     Expected Discharge Date: Expected Discharge Date: 12/07/13  Team Members Present: Physician leading conference: Dr. Alger Simons Social Worker Present: Alfonse Alpers, LCSW Nurse Present: Heather Roberts, RN PT Present: Canary Brim, Rayburn Ma, PT OT Present: Chrys Racer, Starling Manns, OT;Frank [place], OT PPS Coordinator present : Ileana Ladd, PT        Current Status/Progress  Goal  Weekly Team Focus   Medical     thoracic tumor s/p resection with incomplete myelopathy, severe COPD  maximize pulmonary function, bladder emptying  pain mgt, pulmonary mgt, fluid balance   Bowel/Bladder     In and out cath q8hrs, no urge to void, cont of bowel. LBM 11/19/13  To develop the urge to void  Time toileting during the day    Swallow/Nutrition/ Hydration            ADL's     Min A UB ADL, Mod A LB ADL, Mod A sliding board transfers, Max A toileting  Overall Min A  Energy conservation during ADL, adapted LB ADL, transfers, AE training, dynamic sitting balance, toileting skills   Mobility     mod assist sliding board transfer, Max/+2 for standing   supervision/min assist w/c level  strengthening, endurance, standing tolerance, sliding board transfers, w/c mobility and parts management.    Communication            Safety/Cognition/ Behavioral Observations           Pain     No c/o pain   Pain <3  Assess for and treat pain q shift, medicate with prn meds   Skin     White cluster of blister in perinum area, bottom  No skin breakdown  Assess skin for breakdown q shift    Rehab Goals Patient on target to meet rehab goals: Yes Rehab Goals Revised: This is patient's first team conference and goals were set for min assist to supervision. *See Care Plan and progress notes for long and short-term goals.    Barriers  to Discharge:  premorbid deficits, level of injury      Possible Resolutions to Barriers:    family ed, NMR, adaptive equipment      Discharge Planning/Teaching Needs:    Pt plans to go home with her husband after d/c from [facility].   Husband can attend family education, as needed.    Team Discussion:    Pt is a hardworker and sometimes needs to be cued to take rest breaks.  Transfers are hard for her, but she is making progress.  Pt continues to struggle with spasticity, myopathy, weakness, COPD, pain issues, and urine retention.  Pt has ambulation goals, but she will probably be more w/c level.  Husband is working on a ramp at home.  Team would like for husband to come in for family education.   Revisions to Treatment Plan:    None, as this is pt's first conference    Continued Need for Acute Rehabilitation Level of Care: The patient requires daily medical management by a physician with specialized training in physical medicine and rehabilitation for the following conditions: Daily direction of a multidisciplinary physical rehabilitation program to ensure safe treatment while eliciting the highest outcome that is of practical value to the patient.: Yes Daily medical management of patient stability for increased activity during participation in an intensive rehabilitation regime.: Yes Daily analysis of laboratory values and/or radiology reports with any subsequent need for medication adjustment of medical intervention for : Pulmonary problems;Post surgical problems;Neurological problems;Cardiac problems  Oliviana Mcgahee, Silvestre Mesi 11/22/2013, 11:03 AM

## 2013-11-23 NOTE — Progress Notes (Signed)
Occupational Therapy Session Note  Patient Details  Name: [name] MRN: [id number] Date of Birth: 08/17/1961  Today's Date: 11/23/2013 Time: 0730-0830 Time Calculation (min): 60 min  Short Term Goals: Week 1:  OT Short Term Goal 1 (Week 1): Patient will complete lower body bathing using AE, prn, supine in bed with min A OT Short Term Goal 2 (Week 1): Patient will complete upper body bathing at sink or at edge of bed with setup assist OT Short Term Goal 3 (Week 1): Patient will complete dressing using AE prn, sitting and standing, with mod assist OT Short Term Goal 4 (Week 1): Patient will maintain post-surgical safety precautions 100% of the time during ADL.  Skilled Therapeutic Interventions/Progress Updates: ADL-retraining with focus on slide board transfer, weight-shifting, and static and dynamic sitting balance.  Patient received supine in bed, alert and awake although confused by schedule due to retaining schedule from previous day.  OT recovered new schedule from floor and reviewed care plan.   Patient reports no clean clothing although desiring shower.   OT provided wheeled shower chair and assisted with slide board transfer (mod assist) to shower chair.    Patient required re-ed on anterior weight shifting to reposition in wheeled shower chair and setup assist to shower using LH sponge and reacher.   Patient able to dress upper body seated with setup to supplies clothes and lower body supine, HOB elevated, with mod assist to pull up underwear and pants after mod assist slide board transfer back to bed.    Therapy Documentation Precautions:  Precautions Precautions: Back;Fall Precaution Comments: Back precautions reviewed (BAT) Restrictions Weight Bearing Restrictions: No  Vital Signs: Therapy Vitals Temp: 98 F (36.7 C) Temp src: Oral Pulse Rate: 102 Resp: 18 BP: 138/84 mmHg Patient Position, if appropriate: Lying Oxygen Therapy SpO2: 98 % O2 Device: Nasal cannula O2  Flow Rate (L/min): 4 L/min  Pain: Pain Assessment Pain Assessment: No/denies pain Pain Score: 7  Pain Intervention(s): Medication (See eMAR)  ADL: ADL ADL Comments: see FIM  See FIM for current functional status  Therapy/Group: Individual Therapy  Second session: Time: 1430-1500 Time Calculation (min):  30 min  Pain Assessment: No/denies pain  Skilled Therapeutic Interventions: ADL-retraining with focus on slide board transfer w/c >< tub bench.   Patient received in w/c.   Patient completed slide board transfer using small slide board and tub bench with peri cut-out with extra time, setup assist to place board and mod lifting assist to advance across board with verbal cues and min assist to place legs.   Similar assist required to return to w/c after 5 min rest break to recover.   See FIM for current functional status  Therapy/Group: Individual Therapy  Johnasia Liese 11/23/2013, 8:30 AM

## 2013-11-24 ENCOUNTER — Inpatient Hospital Stay (HOSPITAL_COMMUNITY): Payer: Medicare Other | Admitting: *Deleted

## 2013-11-24 ENCOUNTER — Inpatient Hospital Stay (HOSPITAL_COMMUNITY): Payer: Medicare Other

## 2013-11-24 DIAGNOSIS — D497 Neoplasm of unspecified behavior of endocrine glands and other parts of nervous system: Secondary | ICD-10-CM

## 2013-11-24 DIAGNOSIS — IMO0001 Reserved for inherently not codable concepts without codable children: Secondary | ICD-10-CM

## 2013-11-24 DIAGNOSIS — F341 Dysthymic disorder: Secondary | ICD-10-CM

## 2013-11-24 DIAGNOSIS — K560 Paralytic ileus: Secondary | ICD-10-CM

## 2013-11-24 DIAGNOSIS — J449 Chronic obstructive pulmonary disease, unspecified: Secondary | ICD-10-CM

## 2013-11-24 DIAGNOSIS — N189 Chronic kidney disease, unspecified: Secondary | ICD-10-CM

## 2013-11-24 MED ORDER — SENNOSIDES-DOCUSATE SODIUM 8.6-50 MG PO TABS
2.0000 | ORAL_TABLET | Freq: Two times a day (BID) | ORAL | Status: DC
  Administered 2013-11-24 – 2013-11-25 (×3): 2 via ORAL
  Filled 2013-11-24 (×3): qty 2

## 2013-11-24 NOTE — Progress Notes (Signed)
Occupational Therapy Weekly Progress Note  Patient Details  Name: [name] MRN: [id number] Date of Birth: 28-Jan-1961  Today's Date: 11/24/2013  Patient has met 3 of 4 short term goals.    Patient continues to demonstrate the following deficits: Impaired dynamic sitting balance, impaired transfers, impaired edurance and therefore will continue to benefit from skilled OT intervention to enhance overall performance with BADL.  Patient progressing toward long term goals..  Plan of care revisions: Min assist overall.  OT Short Term Goals Week 1:  OT Short Term Goal 1 (Week 1): Patient will complete lower body bathing using AE, prn, supine in bed with min A OT Short Term Goal 1 - Progress (Week 1): Met OT Short Term Goal 2 (Week 1): Patient will complete upper body bathing at sink or at edge of bed with setup assist OT Short Term Goal 2 - Progress (Week 1): Met OT Short Term Goal 3 (Week 1): Patient will complete dressing using AE prn, sitting and standing, with mod assist OT Short Term Goal 3 - Progress (Week 1): Revised due to lack of progress OT Short Term Goal 4 (Week 1): Patient will maintain post-surgical safety precautions 100% of the time during ADL. OT Short Term Goal 4 - Progress (Week 1): Met Week 2:  OT Short Term Goal 1 (Week 2): Patient will complete slide board transfer from w/c to tub bench or BSC with Min A OT Short Term Goal 2 (Week 2): Pateint will demo ability to complete lower body dressing with min assist OT Short Term Goal 3 (Week 2): Patient will demo abilty to monitor symptoms of SOB and initiate energy conservation strategies as needed, independently during ADL OT Short Term Goal 4 (Week 2): Patient will perform light housekeeping task at w/c level with supervision for safety     Therapy Documentation Precautions:  Precautions Precautions: Back;Fall Precaution Comments: Back precautions reviewed (BAT) Restrictions Weight Bearing Restrictions: No  Vital  Signs: Therapy Vitals Pulse Rate: 116 Oxygen Therapy SpO2: 96 % O2 Device: Nasal cannula O2 Flow Rate (L/min): 4 L/min  Pain: Pain Assessment Pain Assessment: No/denies pain  ADL: ADL ADL Comments: see FIM   Leyah Bocchino 11/24/2013, 12:54 PM

## 2013-11-24 NOTE — Progress Notes (Signed)
Occupational Therapy Session Note  Patient Details  Name: [name] MRN: [id number] Date of Birth: [birth year]  Today's Date: 11/24/2013 Time: 0730-0830 Time Calculation (min): 60 min  Short Term Goals: Week 1:  OT Short Term Goal 1 (Week 1): Patient will complete lower body bathing using AE, prn, supine in bed with min A OT Short Term Goal 2 (Week 1): Patient will complete upper body bathing at sink or at edge of bed with setup assist OT Short Term Goal 3 (Week 1): Patient will complete dressing using AE prn, sitting and standing, with mod assist OT Short Term Goal 4 (Week 1): Patient will maintain post-surgical safety precautions 100% of the time during ADL.  Skilled Therapeutic Interventions/Progress Updates: ADL-retraining with focus on slide board transfer, weight shifting, and lateral leans while seated in w/c for lower body dressing.   Patient performed slide board transfers (bed, tub bench) with mod assist and verbal cues to attend to both feet during transfer.  Patient required mod assist to reposition on bath bench with peri-area cut-out due to weakness BUE (shoulders, triceps).   After bathing unassisted, patient returned to w/c and was assisted with lower body dressing to promote improved performance with sit >< stand, supported at sink, as method to assist with pulling up pants.   Patient was unable to rise from w/c, even with max A X1, to sink this session, after 3 attempts.   Patient was then educated on lateral leans, with armrests removed in w/c, and required max assist to pull up pants while she leaned left and right (onto chair and bed from w/c positioned between them).   Patient was left in w/c, dressed, with bedside table, call light and phone within reach.     Therapy Documentation Precautions:  Precautions Precautions: Back;Fall Precaution Comments: Back precautions reviewed (BAT) Restrictions Weight Bearing Restrictions: No  Vital Signs: Therapy Vitals Pulse  Rate: 116 Oxygen Therapy SpO2: 96 % O2 Device: Nasal cannula O2 Flow Rate (L/min): 4 L/min  Pain: Pain Assessment Pain Assessment: 0-10 Pain Score: 2   ADL: ADL ADL Comments: see FIM  See FIM for current functional status  Therapy/Group: Individual Therapy  Second session: Time: 1130-1200 Time Calculation (min):  30 min  Pain Assessment: No denies/pain  Skilled Therapeutic Interventions: Therapeutic exercises at w/c level using thera-band (orange, grade II) to improve shoulder girdle strength in prep for transfers and weight-shifting.   Patient educated on 6 exercises: Chest pull, seated row, scapular chest pull, scapular pull downs, bicep curl, tricep pull downs.   Patient performed 10 reps of each exercise with hand guidance for correct execution of exercise.   Patient required 2 rest breaks during 30 minute session and demo'd compensations during scapular pull downs, using upper traps to elevate BUE.   OT advised AROM exercises, unilateral, to reduce compensatory movement pattern.    Graphic and written literature provided for each exercise.    See FIM for current functional status  Therapy/Group: Individual Therapy  Jakolby Sedivy 11/24/2013, 10:15 AM

## 2013-11-24 NOTE — Progress Notes (Signed)
Pt. has not voided on her own; requiring I/O caths.  Volumes high at q6-8 hours; 900cc.  I/O cathed for 500cc at 1800  (6hr.pd.) Monitor, scan q 4hr and adjust caths per volumes. LBM 3/31; no sensation, urge B/B per pt.  ? initate bowel program with neurogenic B/B; note for MD. Requesting pain med q 6hrs as ordered PRN; c/o back and shoulder pain. Oxy IR15mg  effective.

## 2013-11-24 NOTE — Progress Notes (Signed)
[place] PHYSICAL MEDICINE & REHABILITATION     PROGRESS NOTE    Subjective/Complaints: Bowels slow, bladder not working A 12 point review of systems has been performed and if not noted above is otherwise negative.   Objective: Vital Signs: Blood pressure 115/74, pulse 85, temperature 98 F (36.7 C), temperature source Oral, resp. rate 19, height 5\' 2"  (1.575 m), weight 70.4 kg (155 lb 3.3 oz), SpO2 93.00%. No results found. No results found for this basename: WBC, HGB, HCT, PLT,  in the last 72 hours No results found for this basename: NA, K, CL, CO, GLUCOSE, BUN, CREATININE, CALCIUM,  in the last 72 hours CBG (last 3)  No results found for this basename: GLUCAP,  in the last 72 hours  Wt Readings from Last 3 Encounters:  11/24/13 70.4 kg (155 lb 3.3 oz)  11/14/13 73.755 kg (162 lb 9.6 oz)  11/14/13 73.755 kg (162 lb 9.6 oz)    Physical Exam:  Constitutional: She is oriented to person, place, and time. She appears well-developed and well-nourished.  HENT: dentition fair. Oral mucosa pink/moist  Head: Normocephalic and atraumatic.  Eyes: Conjunctivae are normal.  Cardiovascular: Normal rate and regular rhythm. No murmur  Respiratory: No respiratory distress. She has less wheezes more rhonchi and scattered secretions today GI: Soft. Bowel sounds are normal. She exhibits no distension. There is no tenderness.  Musculoskeletal: She exhibits no edema.  Left leg shorter than right. ?1/2 inch  Neurological: She is alert and oriented to person, place, and time.  Paraparesis with sensory deficits. DTR absent BLE. LLE tr to 1/5 HF,KE. Tr at ankle. RLE is 1+hf, 2-ke, 3 ankle. Sensation 1/2 LLE 1+ RLE. T8 Sensory level. Tone left hamstring/quad is 1+ /4---showing slow improvement Skin: Skin is warm and dry.  Psychiatric: She has a normal mood and affect. Her behavior is normal.    Assessment/Plan: 1. 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 Physiatrist is providing close team supervision and 24 hour management of active medical problems listed below. Physiatrist and rehab team continue to assess barriers to discharge/monitor patient progress toward functional and medical goals. FIM: FIM - Bathing Bathing Steps Patient Completed: Chest;Right Arm;Left Arm;Abdomen;Front perineal area;Buttocks;Right upper leg;Left upper leg;Right lower leg (including foot);Left lower leg (including foot) Bathing: 5: Set-up assist to: Adjust water temp (using wheeled shower chair)  FIM - Upper Body Dressing/Undressing Upper body dressing/undressing steps patient completed: Thread/unthread right sleeve of pullover shirt/dresss;Thread/unthread left sleeve of pullover shirt/dress;Put head through opening of pull over shirt/dress;Pull shirt over trunk Upper body dressing/undressing: 0: Wears gown/pajamas-no public clothing FIM - Lower Body Dressing/Undressing Lower body dressing/undressing steps patient completed: Thread/unthread right underwear leg;Thread/unthread right pants leg Lower body dressing/undressing: 0: Wears Interior and spatial designer  FIM - Toileting Toileting: 1: Two helpers (per Phebe Colla, NT)  FIM - Toilet Transfers Toilet Transfers Assistive Devices: Bedside commode;Sliding board (drop arm and short wood sliding board) Toilet Transfers: 3-To toilet/BSC: Mod A (lift or lower assist);3-From toilet/BSC: Mod A (lift or lower assist);1-Two helpers (+2 to stead board and DME)  FIM - Control and instrumentation engineer Devices: Bed rails;HOB elevated;Arm rests;Sliding board Bed/Chair Transfer: 4: Supine > Sit: Min A (steadying Pt. > 75%/lift 1 leg)  FIM - Locomotion: Wheelchair Distance: 150' Locomotion: Wheelchair: 5: Travels 150 ft or more: maneuvers on rugs and over door sills with  supervision, cueing or coaxing (assistance with  O2 line management) FIM - Locomotion: Ambulation Locomotion: Ambulation Assistive Devices:  (unable to perform safely) Locomotion: Ambulation: 0: Activity did not occur  Comprehension Comprehension Mode: Auditory Comprehension: 5-Understands basic 90% of the time/requires cueing < 10% of the time  Expression Expression Mode: Verbal Expression: 5-Expresses complex 90% of the time/cues < 10% of the time  Social Interaction Social Interaction: 6-Interacts appropriately with others with medication or extra time (anti-anxiety, antidepressant).  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 DVT Prophylaxis/Anticoagulation: Mechanical: Antiembolism stockings, knee (TED hose) Bilateral lower extremities  Sequential compression devices, below knee Bilateral lower extremities  -dopplers negative.  2. Chronic pain due to fibromyalgia as well as back pain with neuropathy/ Pain Management: Use oxycodone 15 mg qid at home as well Lyrica 100 mg tid and cymbalta bid    -follow for activity tolerance 3. Depression with anxiety and panic attacks/Mood: Team to provide ego support. LCSW to follow for evaluation and support. Will continue cymbalta bid, xanax 0.5 mg tid as well as trazodone at bedtime for mood and insomnia.  4.Neuropsych: This patient is capable of making decisions on her own behalf.  5. Severe COPD: Continue oxygen 4 liters--used at home. Continue spiriva and dulera. Complaining of increase in SOB.  -albuterol changed to xopenex neb--added scheduled mucinex which has helped somewhat.   -sputum culture nl.   -continue IS, FV, follow up cxr without change  -afebrile 6. Bowel and  bladder program:   On linzess but ineffective--will need augmentation of bowel program--SSE today. Added miralax daily. Moved bowels over weekend  -regular program    7. Chronic renal failure: Baseline BUN/Cr- 39/1.80 at admission. Near baseline---continue current meds  -BUN/CR close to baseline---recheck next week 8. Diastolic dysfunction with tachycardia (as compensatory for hypoxia): Resumed lasix at 20 mg daily as well as cardizem for BP/ heart rate control. Low salt diet. --need more regular weights  -holding lasix for now given bladder issues 9. Surgical- decadron taper slow (given COPD) 10. Bladder: I and O cath prn/ voiding trial  -continue low dose flomax  -UA neg, culture still pending   LOS (Days) 8 A FACE TO FACE EVALUATION WAS PERFORMED  Kerline Trahan E 11/24/2013 8:11 AM

## 2013-11-24 NOTE — Progress Notes (Signed)
Physical Therapy Session Note  Patient Details  Name: [name] MRN: [id number] Date of Birth: 08-24-1961  Today's Date: 11/24/2013 Time:  -  9:05-10:00 (72min) and 13:46-14:30 (72min)     Skilled Therapeutic Interventions/Progress Updates:  1:2 Tx focused on sliding board transfers, WC mobility and set-up, dynamic sitting balance for core strengthening and increased independence with mobility.  Pt up in [facility]; proplled with S x150  Sit<>stand with +2 in // bars, able to stand x45 sec with heavy UE reliance. Therapist blocking knees and manually facilitating hips na dupright trunk.   Pt instructed to performed WC set-up at mat and attempt baord placement with min A needed for getting close enough and final placement. Pt instructed to reach farthest hole to pull boar din. Pt instructed in WC leg rest management. Needed sitting rest after initial SB placement attempt due to fatigue. WC<>mat with min/mod A, level surface  Dynamic sitting balance including:  - Forward push/pull with green weigted ball 2x10 - diagonal reaching with green ball 2x10 each direction - leaning onto forearm to reach across midline for ring, press up to place other side 2x15 with min A for shoulder support and occasional trunk lift assist.   Attemtoped lateral scooting along mat for increased LE weight bearing and anterior translation. Pt needing Mod A fo radvancement.   Pt returned to room with all needs in reach.   2:2 Tx focused on mobility in apartment setting and standing in standing frame for increased LE strength and activity tolerance. 02 remained >90% on 4L throughout.  Pt continues to propel WC on unit with S as well as in apartment, but needs cues for navigating tight spaces and optimal set-up. Pt able to remove both leg rests with min cues, but still needs assist with board placement.  Performed sliding board WC><bed with Mod A overall to maintain in safe position on board, still needing cues for foot  placement, timing, and head-hips relationship  Sit>supine with mod A, Mod A positioning in bed; and Min A supine>sit Performed standing in frame x57min with bil UE task, manual facilitation for midline trunk.  Pt needing seated rests throughout and cues for inhaling through nose.       Therapy Documentation Precautions:  Precautions Precautions: Back;Fall Precaution Comments: Back precautions reviewed (BAT) Restrictions Weight Bearing Restrictions: No General:   Vital Signs: Therapy Vitals Pulse Rate: 116 Oxygen Therapy SpO2: 96 % O2 Device: Nasal cannula O2 Flow Rate (L/min): 4 L/min Pain: Pain Assessment Pain Assessment: No/denies pain Pain Score: 2  Mobility:   Locomotion : Wheelchair Mobility Distance: 150   See FIM for current functional status  Therapy/Group: Individual Therapy Kennieth Rad, PT, DPT   Kennieth Rad M 11/24/2013, 9:43 AM

## 2013-11-25 ENCOUNTER — Inpatient Hospital Stay (HOSPITAL_COMMUNITY): Payer: Medicare Other | Admitting: Physical Therapy

## 2013-11-25 ENCOUNTER — Inpatient Hospital Stay (HOSPITAL_COMMUNITY): Payer: Medicare Other | Admitting: Occupational Therapy

## 2013-11-25 ENCOUNTER — Inpatient Hospital Stay (HOSPITAL_COMMUNITY): Payer: Medicare Other

## 2013-11-25 ENCOUNTER — Ambulatory Visit (HOSPITAL_COMMUNITY): Payer: Medicare Other | Admitting: Physical Therapy

## 2013-11-25 LAB — BASIC METABOLIC PANEL
BUN: 45 mg/dL — ABNORMAL HIGH (ref 6–23)
CO2: 20 meq/L (ref 19–32)
Calcium: 8.8 mg/dL (ref 8.4–10.5)
Chloride: 100 mEq/L (ref 96–112)
Creatinine, Ser: 0.96 mg/dL (ref 0.50–1.10)
GFR calc Af Amer: 78 mL/min — ABNORMAL LOW (ref >90)
GFR, EST NON AFRICAN AMERICAN: 67 mL/min — AB (ref >90)
GLUCOSE: 199 mg/dL — AB (ref 70–99)
POTASSIUM: 4.2 meq/L (ref 3.7–5.3)
SODIUM: 139 meq/L (ref 137–147)

## 2013-11-25 LAB — BLOOD GAS, ARTERIAL
Acid-Base Excess: 3.6 mmol/L — ABNORMAL HIGH (ref 0.0–2.0)
Bicarbonate: 27 mEq/L — ABNORMAL HIGH (ref 20.0–24.0)
Drawn by: 244901
O2 Content: 4 L/min
O2 Saturation: 86.2 %
PCO2 ART: 36.3 mmHg (ref 35.0–45.0)
PO2 ART: 49 mmHg — AB (ref 80.0–100.0)
Patient temperature: 98.6
TCO2: 28.1 mmol/L (ref 0–100)
pH, Arterial: 7.483 — ABNORMAL HIGH (ref 7.350–7.450)

## 2013-11-25 MED ORDER — POLYETHYLENE GLYCOL 3350 17 G PO PACK
17.0000 g | PACK | Freq: Three times a day (TID) | ORAL | Status: DC
  Administered 2013-11-25 – 2013-12-09 (×29): 17 g via ORAL
  Filled 2013-11-25 (×48): qty 1

## 2013-11-25 MED ORDER — CEFTAZIDIME 1 G IJ SOLR
1.0000 g | Freq: Three times a day (TID) | INTRAMUSCULAR | Status: DC
  Administered 2013-11-25 – 2013-11-26 (×2): 1 g via INTRAVENOUS
  Filled 2013-11-25 (×4): qty 1

## 2013-11-25 MED ORDER — SIMETHICONE 40 MG/0.6ML PO SUSP
80.0000 mg | Freq: Four times a day (QID) | ORAL | Status: DC
  Administered 2013-11-25 – 2013-12-09 (×56): 80 mg via ORAL
  Filled 2013-11-25 (×62): qty 1.2

## 2013-11-25 MED ORDER — OXYCODONE HCL 5 MG PO TABS
5.0000 mg | ORAL_TABLET | Freq: Four times a day (QID) | ORAL | Status: DC | PRN
  Administered 2013-11-25 – 2013-12-08 (×25): 5 mg via ORAL
  Filled 2013-11-25 (×25): qty 1

## 2013-11-25 MED ORDER — NITROFURANTOIN MONOHYD MACRO 100 MG PO CAPS
100.0000 mg | ORAL_CAPSULE | Freq: Two times a day (BID) | ORAL | Status: DC
  Administered 2013-11-25 – 2013-11-29 (×9): 100 mg via ORAL
  Filled 2013-11-25 (×12): qty 1

## 2013-11-25 MED ORDER — MAGNESIUM HYDROXIDE 400 MG/5ML PO SUSP
960.0000 mL | Freq: Once | ORAL | Status: AC
  Administered 2013-11-25: 960 mL via RECTAL
  Filled 2013-11-25: qty 240

## 2013-11-25 MED ORDER — BETHANECHOL CHLORIDE 10 MG PO TABS
10.0000 mg | ORAL_TABLET | Freq: Four times a day (QID) | ORAL | Status: DC
  Filled 2013-11-25 (×5): qty 1

## 2013-11-25 MED ORDER — BISACODYL 10 MG RE SUPP: 10.0000 mg | Freq: Once | RECTAL | Status: DC

## 2013-11-25 MED ORDER — SORBITOL 70 % SOLN: 45.0000 mL | Freq: Once | Status: DC

## 2013-11-25 MED ORDER — BISACODYL 10 MG RE SUPP
10.0000 mg | Freq: Every day | RECTAL | Status: DC
  Administered 2013-11-26 – 2013-11-27 (×2): 10 mg via RECTAL
  Filled 2013-11-25 (×2): qty 1

## 2013-11-25 MED ORDER — METOCLOPRAMIDE HCL 5 MG/ML IJ SOLN
5.0000 mg | Freq: Once | INTRAMUSCULAR | Status: AC
  Administered 2013-11-25: 5 mg via INTRAVENOUS
  Filled 2013-11-25: qty 1

## 2013-11-25 MED ORDER — SODIUM CHLORIDE 0.45 % IV SOLN
INTRAVENOUS | Status: DC
  Administered 2013-11-25: 11:00:00 via INTRAVENOUS
  Administered 2013-11-26: 75 mL/h via INTRAVENOUS
  Administered 2013-11-26: 17:00:00 via INTRAVENOUS
  Administered 2013-11-27: 75 mL/h via INTRAVENOUS

## 2013-11-25 NOTE — Progress Notes (Signed)
Physical Therapy Session Note  Patient Details  Name: [name] MRN: [id number] Date of Birth: [birth year]  Today's Date: 11/25/2013 Time: 0830-0900 Time Calculation (min): 30 min  Short Term Goals: Week 2:  PT Short Term Goal 1 (Week 2): Pt will perform sliding board transer to slightly elevated surface with mod assist.  PT Short Term Goal 2 (Week 2): Pt will be able to self-direct w/c set-up and sliding board placement for sliding board transfers w/c to/from bed or mat.  PT Short Term Goal 3 (Week 2): Pt will demonstrate appropriate pressure relief program when seated in w/c.   Skilled Therapeutic Interventions/Progress Updates:    Upon entry pt looking less cheerful than normal "I don't feel good." Pt vomited small amount, SpO2 85-87% on 4L [place] O2 HR ~116 bpm at rest- RN made aware. - Pt upset that she smoked "just one cigarette," "everyone is mad at me including my husband I just needed some relief I'm so stressed." Discussed that medical team was not mad however concerned about consequences of smoking. Pt educated on impaired cilia function after smoking and decreased ability to move mucous out of lungs. Also educated pt on impaired healing when smoking. Pt verbalized understanding, pt provided with emotional support.    Pt declines OOB but agreeable to Supine LE exercises:  - AAROM ankle pumps - PROM to end range dorsiflexion - AAROM hip/knee flexion, manually resisted hip/knee extension - Manually resisted hip ab/adduction with hips flexed.  - All exercises 2 x 10 reps  Pt missed 30 min skilled PT due to pt illness.    Therapy Documentation Precautions:  Precautions Precautions: Back;Fall Precaution Comments: Back precautions reviewed (BAT) Restrictions Weight Bearing Restrictions: No Pain: Pain Assessment Pain Assessment: 0-10 Pain Score: 7  Pain Type: Acute pain Pain Location: Abdomen Pain Orientation: Upper;Left Pain Descriptors / Indicators: Tightness Pain  Frequency: Constant Pain Onset: Progressive Pain Intervention(s): Repositioned;RN made aware;Distraction;Emotional support  See FIM for current functional status  Therapy/Group: Individual Therapy  Mikhaila, Roh 11/25/2013, 9:10 AM

## 2013-11-25 NOTE — Progress Notes (Signed)
Occupational Therapy Session Note  Patient Details  Name: [name] MRN: [id number] Date of Birth: 1961-07-10  Today's Date: 11/25/2013 Time: 0730-0830 Time Calculation (min): 60 min  Short Term Goals: Week 2:  OT Short Term Goal 1 (Week 2): Patient will complete slide board transfer from w/c to tub bench or BSC with Min A OT Short Term Goal 2 (Week 2): Pateint will demo ability to complete lower body dressing with min assist OT Short Term Goal 3 (Week 2): Patient will demo abilty to monitor symptoms of SOB and initiate energy conservation strategies as needed, independently during ADL OT Short Term Goal 4 (Week 2): Patient will perform light housekeeping task at w/c level with supervision for safety  Skilled Therapeutic Interventions/Progress Updates: ADL-retraining with focus on improved endurance, transfers, and re-ed on energy conservation strategies.  Patient received supine in bed with RN attending.   Patient reported increased abdominal pain and difficulty breathing since previous night.   OT noted oxygen tank on w/c and questioned patient regarding off ward pass.   Patient confirmed that she was off ward and when confronted with odor of tobacco present in room also acknowledged smoking one cigarette with family member (not husband).  OT and RN both re-educated patient on presentation of symptoms relating to tobacco use.   Patient was advised to take nourishment and perform bed mobility for proper positioning but required total assist to move toward head of bed.   Patient self-fed independently and was encouraged to continue pressure relief and positional changes due to poor sitting posture as well as transfer to w/c to assist with passing BM due to complaint of abdominal pain in upper quadrant contributing to SOB.   Patient agreed but required max assist to rise to sitting at edge of bed.   Patient was offered total assist transfer to w/c using [facility] board but was unable to tolerate static  sitting at edge of bed and requested return to supine, HOB elevated.   Patient remained dependent for positioning and operation of hospital bed; call light and bedside table placed within reach.    Therapy Documentation Precautions:  Precautions Precautions: Back;Fall Precaution Comments: Back precautions reviewed (BAT) Restrictions Weight Bearing Restrictions: No  Vital Signs: Therapy Vitals Temp: 98.6 F (37 C) Temp src: Oral Pulse Rate: 125 Resp: 19 BP: 127/85 mmHg Patient Position, if appropriate: Lying Oxygen Therapy SpO2: 88 % O2 Device: Nasal cannula O2 Flow Rate (L/min): 4 L/min  Pain: Pain Assessment Pain Assessment: 0-10 Pain Score: 7  Pain Type: Acute pain Pain Location: Abdomen Pain Orientation: Upper;Left Pain Descriptors / Indicators: Tightness Pain Frequency: Constant Pain Onset: Progressive Pain Intervention(s): Repositioned;RN made aware;Distraction;Emotional support  ADL: ADL ADL Comments: see FIM  See FIM for current functional status  Therapy/Group: Individual Therapy  [name] 11/25/2013, 9:48 AM

## 2013-11-25 NOTE — Progress Notes (Signed)
[place] PHYSICAL MEDICINE & REHABILITATION     PROGRESS NOTE    Subjective/Complaints: C/O abd bloating, discomfort, constipation, small amt emesis, mild SOB No CP A 12 point review of systems has been performed and if not noted above is otherwise negative.   Objective: Vital Signs: Blood pressure 127/85, pulse 125, temperature 98.6 F (37 C), temperature source Oral, resp. rate 19, height 5\' 2"  (1.575 m), weight 72.1 kg (158 lb 15.2 oz), SpO2 88.00%. No results found. No results found for this basename: WBC, HGB, HCT, PLT,  in the last 72 hours No results found for this basename: NA, K, CL, CO, GLUCOSE, BUN, CREATININE, CALCIUM,  in the last 72 hours CBG (last 3)  No results found for this basename: GLUCAP,  in the last 72 hours  Wt Readings from Last 3 Encounters:  11/25/13 72.1 kg (158 lb 15.2 oz)  11/14/13 73.755 kg (162 lb 9.6 oz)  11/14/13 73.755 kg (162 lb 9.6 oz)    Physical Exam:  Constitutional: She is oriented to person, place, and time. She appears well-developed and well-nourished.  HENT: dentition fair. Oral mucosa pink/moist  Head: Normocephalic and atraumatic.  Eyes: Conjunctivae are normal.  Cardiovascular: Normal rate and regular rhythm. No murmur  Respiratory: No respiratory distress. She has less wheezes ,scattered GI: Soft. Bowel sounds are normal. She exhibits no distension. There is no tenderness.  Musculoskeletal: She exhibits no edema.  No calf tenderness Neurological: She is alert and oriented to person, place, and time.  Paraparesis with sensory deficits. DTR absent BLE. LLE tr to 1/5 HF,KE. Tr at ankle. RLE is 1+hf, 2-ke, 3 ankle. Sensation 1/2 LLE 1+ RLE. T8 Sensory level. Tone left hamstring/quad is 1+ /4---showing slow improvement Skin: Skin is warm and dry.  Psychiatric: She has a normal mood and affect. Her behavior is normal.    Assessment/Plan: 1. 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 Physiatrist is providing close team supervision and 24 hour management of active medical problems listed below. Physiatrist and rehab team continue to assess barriers to discharge/monitor patient progress toward functional and medical goals. FIM: FIM - Bathing Bathing Steps Patient Completed: Chest;Right Arm;Left Arm;Abdomen;Front perineal area;Buttocks;Right upper leg;Left upper leg;Right lower leg (including foot);Left lower leg (including foot) Bathing: 5: Set-up assist to: Adjust water temp (using wheeled shower chair)  FIM - Upper Body Dressing/Undressing Upper body dressing/undressing steps patient completed: Thread/unthread right sleeve of pullover shirt/dresss;Thread/unthread left sleeve of pullover shirt/dress;Put head through opening of pull over shirt/dress;Pull shirt over trunk Upper body dressing/undressing: 0: Wears gown/pajamas-no public clothing FIM - Lower Body Dressing/Undressing Lower body dressing/undressing steps patient completed: Thread/unthread right underwear leg;Thread/unthread right pants leg Lower body dressing/undressing: 0: Wears Interior and spatial designer  FIM - Toileting Toileting: 1: Two helpers  Carl: Bedside commode;Sliding board (drop arm and short wood sliding board) Toilet Transfers: 1-Two helpers  FIM - Control and instrumentation engineer Devices: Arm rests;Sliding board Bed/Chair Transfer: 1: Two helpers  FIM - Locomotion: Wheelchair Distance: 150 Locomotion: Wheelchair: 5: Travels 150 ft or more: maneuvers on rugs and over door sills with supervision, cueing or coaxing FIM - Locomotion: Ambulation Locomotion: Ambulation Assistive Devices:  (unable to perform safely) Locomotion: Ambulation: 0: Activity did not occur  Comprehension Comprehension Mode:  Auditory Comprehension: 5-Understands complex 90% of the time/Cues < 10% of the time  Expression Expression  Mode: Verbal Expression: 5-Expresses complex 90% of the time/cues < 10% of the time  Social Interaction Social Interaction: 6-Interacts appropriately with others with medication or extra time (anti-anxiety, antidepressant).  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 DVT Prophylaxis/Anticoagulation: Mechanical: Antiembolism stockings, knee (TED hose) Bilateral lower extremities  Sequential compression devices, below knee Bilateral lower extremities  -dopplers negative.  2. Chronic pain due to fibromyalgia as well as back pain with neuropathy/ Pain Management: reduce oxycodone 5 mg qid  as well Lyrica 100 mg tid and cymbalta bid    -follow for activity tolerance 3. Depression with anxiety and panic attacks/Mood: Team to provide ego support. LCSW to follow for evaluation and support. Will continue cymbalta bid, xanax 0.5 mg tid as well as trazodone at bedtime for mood and insomnia.  4.Neuropsych: This patient is capable of making decisions on her own behalf.  5. Severe COPD: Continue oxygen 4 liters--used at home. Continue spiriva and dulera. Complaining of increase in SOB, check ABG, may be at baseline.  -albuterol changed to xopenex neb--added scheduled mucinex which has helped somewhat.   -sputum culture nl.   -continue IS, FV, follow up cxr without change  -afebrile 6.Neurogenic Bowel and bladder :   On linzess but ineffective--now with ileus, check KUB, NPO except ice chips, rduce narcotics, single dose IV reglan,  Added miralax daily.   -senna    7. Chronic renal failure: Baseline BUN/Cr- 39/1.80 at admission.  Near baseline---continue current meds  -BUN/CR close to baseline---recheck next week 8. Diastolic dysfunction with tachycardia (as compensatory for hypoxia): Resumed lasix at 20 mg daily as well as cardizem for BP/ heart rate control. Low salt diet. --need more regular weights  -holding lasix for now given bladder issues 9. Surgical- decadron taper slow (given COPD) 10. Bladder: I and O cath prn/ voiding trial  -continue low dose flomax  -UA neg, culture still pending   LOS (Days) 9 A FACE TO FACE EVALUATION WAS PERFORMED  Devondre Guzzetta E 11/25/2013 9:23 AM

## 2013-11-25 NOTE — Progress Notes (Signed)
Patient complained of abdominal pain throughout day . Abdomen distended abdominal girth 31cm  At 1600. Soap suds enema given approximately 1150 with minimal results liquid. Patient also given SMOG enema approximately 1650. Moderate results 1 large hard bm noted with multiple small hard bm's followed . Patient continued to complain of abdominal pain. Shift change RN reported to insert NG tube to decrease gastric pain . Continue with plan of care .          [name]

## 2013-11-25 NOTE — Progress Notes (Signed)
Physical Therapy Session Note  Patient Details  Name: [name] MRN: [id number] Date of Birth: [birth year]  Today's Date: 11/25/2013 Time: 8185-6314 Time Calculation (min): 45 min  Skilled Therapeutic Interventions/Progress Updates:    Pt still dyspneic upon entry - "I just still don't feel good." SpO2 04% on 5L O2. Supine>sit mod assist (decline), upon sitting SpO2 improved to 100% on 5L so pt bumped to 4L O2 and SpO2>94% rest of session. Husband arrived late, PT demo of caregiver support for sliding board transfer. Pt able to self-direct 50% of board placement and transfer. Pt requires mod assist for bed>recliner. Husband did not partake in hands-on family ed.   HR up to 113 during session, down to 102 rest/end of session.   Pt missed 15 min skilled PT due to general malaise and shortness of breath.   Therapy Documentation Precautions:  Precautions Precautions: Back;Fall Precaution Comments: Back precautions reviewed (BAT) Restrictions Weight Bearing Restrictions: No Pain:  abdominal pain to palpation - not rated, RN aware.   See FIM for current functional status  Therapy/Group: Individual Therapy  Noa, Galvao 11/25/2013, 3:07 PM

## 2013-11-25 NOTE — Progress Notes (Signed)
Occupational Therapy Session Note  Patient Details  Name: [name] MRN: [id number] Date of Birth: 1960-11-25  Today's Date: 11/25/2013 Time: 1430-1455 Time Calculation (min): 25 min  Short Term Goals: Week 1:  OT Short Term Goal 1 (Week 1): Patient will complete lower body bathing using AE, prn, supine in bed with min A OT Short Term Goal 1 - Progress (Week 1): Met OT Short Term Goal 2 (Week 1): Patient will complete upper body bathing at sink or at edge of bed with setup assist OT Short Term Goal 2 - Progress (Week 1): Met OT Short Term Goal 3 (Week 1): Patient will complete dressing using AE prn, sitting and standing, with mod assist OT Short Term Goal 3 - Progress (Week 1): Revised due to lack of progress OT Short Term Goal 4 (Week 1): Patient will maintain post-surgical safety precautions 100% of the time during ADL. OT Short Term Goal 4 - Progress (Week 1): Met  Week 2:  OT Short Term Goal 1 (Week 2): Patient will complete slide board transfer from w/c to tub bench or BSC with Min A OT Short Term Goal 2 (Week 2): Pateint will demo ability to complete lower body dressing with min assist OT Short Term Goal 3 (Week 2): Patient will demo abilty to monitor symptoms of SOB and initiate energy conservation strategies as needed, independently during ADL OT Short Term Goal 4 (Week 2): Patient will perform light housekeeping task at w/c level with supervision for safety  Skilled Therapeutic Interventions/Progress Updates:  Upon entering room, patient found seated in recliner with complaints of fatigue and SOB. Therapist educated patient on how to check her 02 sats.02 sats checked throughout =92-94%. Patient requested to get back to bed. Therapist set-up room and patient transferred back to bed with max assist X2 using slide board(recliner>EOB-uphill). Patient then laid supine with moderate assistance. Therapist assisted with re-positioning patient in bed and left patient in chair like  position with [facility] raised and all needed items within reach.   Precautions Precautions: Back;Fall Precaution Comments: Back precautions reviewed (BAT) Restrictions Weight Bearing Restrictions: No  See FIM for current functional status  Therapy/Group: Individual Therapy  Rosevelt Luu 11/25/2013, 3:13 PM

## 2013-11-25 NOTE — Progress Notes (Signed)
1. Nursing reports family has been taking patient out to smoke. Patient continues to have significant dyspnea and has been educated on detrimental effects of ongoing nicotine use. This was emphasized today but doubt compliance. Both patient and husband are oxygen dependent.  2. Abdominal pain: due to constipation. No BM X 2 days and significant stool burden on admission. Will repeat KUB and SSE today.  May need suppository daily.  3. Ongoing Urinary retention: Will continue to work on bowel program and hopefully bladder function will improve as this resolves.  Urine culture still pending.  Start family education on in and out caths. Will start low dose urecholine.

## 2013-11-26 ENCOUNTER — Inpatient Hospital Stay (HOSPITAL_COMMUNITY): Payer: Medicare Other

## 2013-11-26 LAB — URINE CULTURE: Colony Count: >=100000

## 2013-11-26 MED ORDER — FUROSEMIDE 10 MG/ML IJ SOLN
20.0000 mg | Freq: Two times a day (BID) | INTRAMUSCULAR | Status: DC
  Administered 2013-11-26 – 2013-11-27 (×3): 20 mg via INTRAVENOUS
  Filled 2013-11-26 (×5): qty 2

## 2013-11-26 MED ORDER — SORBITOL 70 % SOLN
960.0000 mL | TOPICAL_OIL | Freq: Once | ORAL | Status: AC
  Administered 2013-11-26: 960 mL via RECTAL
  Filled 2013-11-26: qty 240

## 2013-11-26 NOTE — Progress Notes (Addendum)
[place] PHYSICAL MEDICINE & REHABILITATION     PROGRESS NOTE    Subjective/Complaints: Pt now has NG to intermittent suction, abd feels less distended No CP A 12 point review of systems has been performed and if not noted above is otherwise negative.   Objective: Vital Signs: Blood pressure 123/70, pulse 83, temperature 98 F (36.7 C), temperature source Oral, resp. rate 20, height 5\' 2"  (1.575 m), weight 72.6 kg (160 lb 0.9 oz), SpO2 90.00%. Dg Abd 1 View  11/25/2013   CLINICAL DATA:  Abdominal pain.  EXAM: ABDOMEN - 1 VIEW  COMPARISON:  11/16/2013  FINDINGS: There is gaseous distension of the stomach. Significant stool burden is noted in nondilated loops of colon. The colon does appear redundant. Surgical clips are identified in the right upper quadrant of the abdomen. The patient has had ORIF of the left hip.  IMPRESSION: Significant stool burden.   Electronically Signed   By: Shon Hale M.D.   On: 11/25/2013 11:40   Dg Abd Portable 1v  11/25/2013   CLINICAL DATA:  Nasogastric tube placement.  EXAM: PORTABLE ABDOMEN - 1 VIEW  COMPARISON:  Abdominal radiograph performed earlier today at 11:33 a.m.  FINDINGS: The patient's enteric tube is seen ending at the body of the stomach. Its sideport is noted at the distal esophagus. This could be advanced another 5 cm, as deemed clinically appropriate.  The visualized bowel gas pattern is grossly unremarkable. Scattered air-filled loops of small bowel are seen. The colon is largely filled with stool. No free intra-abdominal air is identified, though evaluation for free air is limited on a single supine view.  The lung bases are not well assessed. Left basilar airspace opacity may reflect atelectasis. Postoperative change is noted at the left femoral head.  IMPRESSION: Enteric tube seen ending at the body of the stomach, with its sideport in the distal esophagus. This could be advanced another 5 cm, as deemed clinically appropriate.   Electronically  Signed   By: Garald Balding M.D.   On: 11/25/2013 21:32   No results found for this basename: WBC, HGB, HCT, PLT,  in the last 72 hours  Recent Labs  11/25/13 1240  NA 139  K 4.2  CL 100  GLUCOSE 199*  BUN 45*  CREATININE 0.96  CALCIUM 8.8   CBG (last 3)  No results found for this basename: GLUCAP,  in the last 72 hours  Wt Readings from Last 3 Encounters:  11/26/13 72.6 kg (160 lb 0.9 oz)  11/14/13 73.755 kg (162 lb 9.6 oz)  11/14/13 73.755 kg (162 lb 9.6 oz)    Physical Exam:  Constitutional: She is oriented to person, place, and time. She appears well-developed and well-nourished.  HENT: dentition fair. Oral mucosa pink/moist  Head: Normocephalic and atraumatic.  Eyes: Conjunctivae are normal.  Cardiovascular: Normal rate and regular rhythm. No murmur  Respiratory: No respiratory distress. She has crackles L>R GI: Soft. Bowel sounds are normal. She exhibits no distension. There is no tenderness.  Musculoskeletal: She exhibits no edema.  No calf tenderness Neurological: She is alert and oriented to person, place, and time.  Paraparesis with sensory deficits. DTR absent BLE. LLE tr to 1/5 HF,KE. Tr at ankle. RLE is 1+hf, 2-ke, 3 ankle. Sensation 1/2 LLE 1+ RLE. T8 Sensory level. Tone left hamstring/quad is 1+ /4---showing slow improvement Skin: Skin is warm and dry.  Psychiatric: She has a normal mood and affect. Her behavior is normal.    Assessment/Plan: 1. 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 Physiatrist is providing close team supervision and 24 hour management of active medical problems listed below. Physiatrist and rehab team continue to assess barriers to discharge/monitor patient progress toward functional and medical goals. FIM: FIM - Bathing Bathing Steps Patient Completed: Chest;Right Arm;Left  Arm;Abdomen;Front perineal area;Buttocks;Right upper leg;Left upper leg;Right lower leg (including foot);Left lower leg (including foot) Bathing: 0: Activity did not occur  FIM - Upper Body Dressing/Undressing Upper body dressing/undressing steps patient completed: Thread/unthread right sleeve of pullover shirt/dresss;Thread/unthread left sleeve of pullover shirt/dress;Put head through opening of pull over shirt/dress;Pull shirt over trunk Upper body dressing/undressing: 0: Activity did not occur FIM - Lower Body Dressing/Undressing Lower body dressing/undressing steps patient completed: Thread/unthread right underwear leg;Thread/unthread right pants leg Lower body dressing/undressing: 0: Activity did not occur  FIM - Toileting Toileting: 1: Two helpers  FIM - Radio producer Devices: Bedside commode;Sliding board (drop arm and short wood sliding board) Toilet Transfers: 1-Two helpers  FIM - Control and instrumentation engineer Devices: Sliding board;Bed rails;HOB elevated Bed/Chair Transfer: 0: Activity did not occur  FIM - Locomotion: Wheelchair Distance: 150 Locomotion: Wheelchair: 5: Travels 150 ft or more: maneuvers on rugs and over door sills with supervision, cueing or coaxing FIM - Locomotion: Ambulation Locomotion: Ambulation Assistive Devices:  (unable to perform safely) Locomotion: Ambulation: 0: Activity did not occur  Comprehension Comprehension Mode: Auditory Comprehension: 5-Understands complex 90% of the time/Cues < 10% of the time  Expression Expression Mode: Verbal Expression: 5-Expresses complex 90% of the time/cues < 10% of the time  Social Interaction Social Interaction: 6-Interacts appropriately with others with medication or extra time (anti-anxiety, antidepressant).  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 DVT Prophylaxis/Anticoagulation: Mechanical: Antiembolism stockings, knee (TED hose) Bilateral lower extremities  Sequential compression devices, below knee Bilateral lower extremities  -dopplers negative.  2. Chronic pain due to fibromyalgia as well as back pain with neuropathy/ Pain Management: reduce oxycodone 5 mg qid  as well Lyrica 100 mg tid and cymbalta bid    -follow for activity tolerance 3. Depression with anxiety and panic attacks/Mood: Team to provide ego support. LCSW to follow for evaluation and support. Will continue cymbalta bid, xanax 0.5 mg tid as well as trazodone at bedtime for mood and insomnia.  4.Neuropsych: This patient is capable of making decisions on her own behalf.  5. Severe COPD: Continue oxygen 4 liters--used at home. Continue spiriva and dulera. ABG shows no hypercarbia  -albuterol changed to xopenex neb--added scheduled mucinex which has helped somewhat.   -sputum culture nl.   -continue IS, FV, follow up cxr without change  -afebrile 6.Neurogenic Bowel and bladder :   On linzess but ineffective--now with ileus, check KUB, NPO except ice chips, rduce narcotics, single dose IV reglan,  Added miralax daily.   -senna    7. Chronic renal failure: Baseline BUN/Cr- 39/1.80 at admission. Near baseline---continue current meds  -BUN/CR close to baseline---recheck next week 8. Diastolic dysfunction with tachycardia (as compensatory for hypoxia) on IVF resume Lasix start IV 20mg  BID  9. Surgical- decadron taper slow (given COPD) 10. Bladder: I and  O cath prn/ voiding trial  -continue low dose flomax  -UA neg, culture showing both pseudomonas and enterococcus, prob colonization   LOS (Days) 10 A FACE TO FACE EVALUATION WAS PERFORMED  Shani Fitch E 11/26/2013 8:29 AM

## 2013-11-26 NOTE — Progress Notes (Signed)
Physical Therapy Session Note  Patient Details  Name: [name] MRN: [id number] Date of Birth: 04/21/1961  Today's Date: 11/26/2013 Time: 6415-8309 Time Calculation (min): 40 min  Short Term Goals: Week 2:  PT Short Term Goal 1 (Week 2): Pt will perform sliding board transer to slightly elevated surface with mod assist.  PT Short Term Goal 2 (Week 2): Pt will be able to self-direct w/c set-up and sliding board placement for sliding board transfers w/c to/from bed or mat.  PT Short Term Goal 3 (Week 2): Pt will demonstrate appropriate pressure relief program when seated in w/c.   Skilled Therapeutic Interventions/Progress Updates:  Patient sitting in wheelchair upon entering room. Patient on 4L O2. Patient propelled wheelchair 150 feet from room to gym with supervision (min assist for oxygen tank). Patient HR at 200. With deep breathing, relaxation, and imagery, HR able to come down to 97 in a few minutes. Patient directed wheelchair set up and place ment of sliding board in preparation for transfer. Patient performed transfer with mod assist. HR again up to above 200. Patient's nurse notified of elevated heart rate and reported all cardiac meds have been given. Patient performed transfer from mat to wheelchair with sliding board and mod to max assist for safety. Patient HR 89 following transfer. Patient propelled wheelchair 150 feet back to room and HR back above 200. Patient assisted back to bed with max assist sliding board transfer. Patient max assist (lifting both LE's) for sit to supine and positioning in bed. Nurse notified. Patient left in bed with all items in reach.  Therapy Documentation Precautions:  Precautions Precautions: Back;Fall Precaution Comments: Back precautions reviewed (BAT) Restrictions Weight Bearing Restrictions: No Vital Signs: Therapy Vitals Temp: 97.9 F (36.6 C) Temp src: Oral Pulse Rate: 200 Resp: 19 BP: 115/73 mmHg Patient Position, if appropriate:  Lying Oxygen Therapy SpO2: 95 % O2 Device: Nasal cannula O2 Flow Rate (L/min): 4 L/min Pulse Oximetry Type: Intermittent Pain: Pain Assessment Pain Assessment: No/denies pain Locomotion : Wheelchair Mobility Distance: 150   See FIM for current functional status  Therapy/Group: Individual Therapy  Sanjuana Letters 11/26/2013, 3:46 PM

## 2013-11-27 ENCOUNTER — Inpatient Hospital Stay (HOSPITAL_COMMUNITY): Payer: Medicare Other | Admitting: *Deleted

## 2013-11-27 MED ORDER — FLEET ENEMA 7-19 GM/118ML RE ENEM
1.0000 | ENEMA | Freq: Every day | RECTAL | Status: DC
  Administered 2013-11-28 – 2013-12-07 (×9): 1 via RECTAL
  Filled 2013-11-27 (×18): qty 1

## 2013-11-27 MED ORDER — FUROSEMIDE 40 MG PO TABS
40.0000 mg | ORAL_TABLET | Freq: Every day | ORAL | Status: DC
  Administered 2013-11-28 – 2013-12-09 (×12): 40 mg via ORAL
  Filled 2013-11-27 (×14): qty 1

## 2013-11-27 NOTE — Progress Notes (Signed)
[place] PHYSICAL MEDICINE & REHABILITATION     PROGRESS NOTE    Subjective/Complaints: No SOB, no abdominal pain, no vomiting No CP A 12 point review of systems has been performed and if not noted above is otherwise negative.   Objective: Vital Signs: Blood pressure 137/80, pulse 96, temperature 98.2 F (36.8 C), temperature source Oral, resp. rate 19, height 5\' 2"  (1.575 m), weight 71.5 kg (157 lb 10.1 oz), SpO2 96.00%. Dg Abd 1 View  11/25/2013   CLINICAL DATA:  Abdominal pain.  EXAM: ABDOMEN - 1 VIEW  COMPARISON:  11/16/2013  FINDINGS: There is gaseous distension of the stomach. Significant stool burden is noted in nondilated loops of colon. The colon does appear redundant. Surgical clips are identified in the right upper quadrant of the abdomen. The patient has had ORIF of the left hip.  IMPRESSION: Significant stool burden.   Electronically Signed   By: Shon Hale M.D.   On: 11/25/2013 11:40   Dg Abd Portable 1v  11/25/2013   CLINICAL DATA:  Nasogastric tube placement.  EXAM: PORTABLE ABDOMEN - 1 VIEW  COMPARISON:  Abdominal radiograph performed earlier today at 11:33 a.m.  FINDINGS: The patient's enteric tube is seen ending at the body of the stomach. Its sideport is noted at the distal esophagus. This could be advanced another 5 cm, as deemed clinically appropriate.  The visualized bowel gas pattern is grossly unremarkable. Scattered air-filled loops of small bowel are seen. The colon is largely filled with stool. No free intra-abdominal air is identified, though evaluation for free air is limited on a single supine view.  The lung bases are not well assessed. Left basilar airspace opacity may reflect atelectasis. Postoperative change is noted at the left femoral head.  IMPRESSION: Enteric tube seen ending at the body of the stomach, with its sideport in the distal esophagus. This could be advanced another 5 cm, as deemed clinically appropriate.   Electronically Signed   By: Garald Balding M.D.   On: 11/25/2013 21:32   No results found for this basename: WBC, HGB, HCT, PLT,  in the last 72 hours  Recent Labs  11/25/13 1240  NA 139  K 4.2  CL 100  GLUCOSE 199*  BUN 45*  CREATININE 0.96  CALCIUM 8.8   CBG (last 3)  No results found for this basename: GLUCAP,  in the last 72 hours  Wt Readings from Last 3 Encounters:  11/27/13 71.5 kg (157 lb 10.1 oz)  11/14/13 73.755 kg (162 lb 9.6 oz)  11/14/13 73.755 kg (162 lb 9.6 oz)    Physical Exam:  Constitutional: She is oriented to person, place, and time. She appears well-developed and well-nourished.  HENT: dentition fair. Oral mucosa pink/moist  Head: Normocephalic and atraumatic.  Eyes: Conjunctivae are normal.  Cardiovascular: Normal rate and regular rhythm. No murmur  Respiratory: No respiratory distress. She has crackles L>R GI: Soft. Bowel sounds are normal. She exhibits no distension. There is no tenderness.  Musculoskeletal: She exhibits no edema.  No calf tenderness Neurological: She is alert and oriented to person, place, and time.  Paraparesis with sensory deficits. DTR absent BLE. LLE tr to 1/5 HF,KE. Tr at ankle. RLE is 1+hf, 2-ke, 3 ankle. Sensation 1/2 LLE 1+ RLE. T8 Sensory level. Tone left hamstring/quad is 1+ /4---showing slow improvement Skin: Skin is warm and dry.  Psychiatric: She has a normal mood and affect. Her behavior is normal.    Assessment/Plan: 1. Functional deficits secondary to T8 spinal  tumor s/p resection with paraplegia/myelopathy which require 3+ hours per day of interdisciplinary therapy in a comprehensive inpatient rehab setting. Physiatrist is providing close team supervision and 24 hour management of active medical problems listed below. Physiatrist and rehab team continue to assess barriers to discharge/monitor patient progress toward functional and medical goals. FIM: FIM - Bathing Bathing Steps Patient Completed: Chest;Right Arm;Left Arm;Abdomen;Front perineal  area;Buttocks;Right upper leg;Left upper leg;Right lower leg (including foot);Left lower leg (including foot) Bathing: 0: Activity did not occur  FIM - Upper Body Dressing/Undressing Upper body dressing/undressing steps patient completed: Thread/unthread right sleeve of pullover shirt/dresss;Thread/unthread left sleeve of pullover shirt/dress;Put head through opening of pull over shirt/dress;Pull shirt over trunk Upper body dressing/undressing: 0: Activity did not occur FIM - Lower Body Dressing/Undressing Lower body dressing/undressing steps patient completed: Thread/unthread right underwear leg;Thread/unthread right pants leg Lower body dressing/undressing: 0: Activity did not occur  FIM - Toileting Toileting: 1: Two helpers  FIM - Radio producer Devices: Bedside commode;Sliding board (drop arm and short wood sliding board) Toilet Transfers: 1-Two helpers  FIM - Control and instrumentation engineer Devices: Sliding board Bed/Chair Transfer: 3: Sit > Supine: Mod A (lifting assist/Pt. 50-74%/lift 2 legs);2: Chair or W/C > Bed: Max A (lift and lower assist)  FIM - Locomotion: Wheelchair Distance: 150 Locomotion: Wheelchair: 5: Travels 150 ft or more: maneuvers on rugs and over door sills with supervision, cueing or coaxing FIM - Locomotion: Ambulation Locomotion: Ambulation Assistive Devices:  (unable to perform safely) Locomotion: Ambulation: 0: Activity did not occur  Comprehension Comprehension Mode: Auditory Comprehension: 5-Follows basic conversation/direction: With no assist  Expression Expression Mode: Verbal Expression: 5-Expresses basic needs/ideas: With no assist  Social Interaction Social Interaction: 6-Interacts appropriately with others with medication or extra time (anti-anxiety, antidepressant).  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 DVT Prophylaxis/Anticoagulation: Mechanical: Antiembolism stockings, knee (TED hose) Bilateral lower extremities  Sequential compression devices, below knee Bilateral lower extremities  -dopplers negative.  2. Chronic pain due to fibromyalgia as well as back pain with neuropathy/ Pain Management: reduce oxycodone 5 mg qid  as well Lyrica 100 mg tid and cymbalta bid    -follow for activity tolerance 3. Depression with anxiety and panic attacks/Mood: Team to provide ego support. LCSW to follow for evaluation and support. Will continue cymbalta bid, xanax 0.5 mg tid as well as trazodone at bedtime for mood and insomnia.  4.Neuropsych: This patient is capable of making decisions on her own behalf.  5. Severe COPD: Continue oxygen 4 liters--used at home. Continue spiriva and dulera. ABG shows no hypercarbia  -albuterol changed to xopenex neb--added scheduled mucinex which has helped somewhat.   -sputum culture nl.   -continue IS, FV, follow up cxr without change  -afebrile 6.Neurogenic Bowel and bladder :   On linzess but ineffective-- ileus resolving, advance diet as tolerated reduce narcotics,   Good results with SMOG   -senna    7. Chronic renal failure: Baseline BUN/Cr- 39/1.80 at admission. Near baseline---continue current meds  -BUN/CR close to baseline---recheck next week 8. Diastolic dysfunction with tachycardia (as compensatory for hypoxia) on IVF resume Lasix po 20mg  BID  9. Surgical- decadron taper slow (given COPD) 10. Bladder: I and O cath prn/ voiding trial  -continue low dose flomax  -UA neg,  culture showing both pseudomonas and enterococcus, prob colonization   LOS (Days) 11 A FACE TO FACE EVALUATION WAS PERFORMED  Charlett Blake 11/27/2013 7:39 AM

## 2013-11-27 NOTE — Progress Notes (Signed)
Occupational Therapy Note  Patient Details  Name: [name] MRN: [id number] Date of Birth: February 25, 1961 Today's Date: 11/27/2013  Patient missing 60 min skilled OT this PM secondary to unavailable d/t nursing care. Nurse tech attempted 3x during therapy session to bring pt down to therapeutic group however patient unavailable each time.    Calani Gick N 11/27/2013, 2:59 PM

## 2013-11-27 NOTE — Progress Notes (Signed)
89 Husband educated on I/O catheretization.  Husband Continues to need ongoing education and reinforcement as available.

## 2013-11-28 ENCOUNTER — Inpatient Hospital Stay (HOSPITAL_COMMUNITY): Payer: Medicare Other | Admitting: Physical Therapy

## 2013-11-28 ENCOUNTER — Inpatient Hospital Stay (HOSPITAL_COMMUNITY): Payer: Medicare Other | Admitting: *Deleted

## 2013-11-28 ENCOUNTER — Inpatient Hospital Stay (HOSPITAL_COMMUNITY): Payer: Medicare Other

## 2013-11-28 DIAGNOSIS — N189 Chronic kidney disease, unspecified: Secondary | ICD-10-CM

## 2013-11-28 DIAGNOSIS — D497 Neoplasm of unspecified behavior of endocrine glands and other parts of nervous system: Secondary | ICD-10-CM

## 2013-11-28 DIAGNOSIS — IMO0001 Reserved for inherently not codable concepts without codable children: Secondary | ICD-10-CM

## 2013-11-28 DIAGNOSIS — F341 Dysthymic disorder: Secondary | ICD-10-CM

## 2013-11-28 DIAGNOSIS — J449 Chronic obstructive pulmonary disease, unspecified: Secondary | ICD-10-CM

## 2013-11-28 NOTE — Progress Notes (Signed)
Physical Therapy Session Note  Patient Details  Name: [name] MRN: [id number] Date of Birth: [birth year]  Today's Date: 11/28/2013 Time: 1130-1157 Time Calculation (min): 27 min  Short Term Goals: Week 2:  PT Short Term Goal 1 (Week 2): Pt will perform sliding board transer to slightly elevated surface with mod assist.  PT Short Term Goal 2 (Week 2): Pt will be able to self-direct w/c set-up and sliding board placement for sliding board transfers w/c to/from bed or mat.  PT Short Term Goal 3 (Week 2): Pt will demonstrate appropriate pressure relief program when seated in w/c.   Skilled Therapeutic Interventions/Progress Updates:    Patient received semi-reclined in bed, reports she was returned to bed after showering because she was "so wipe out." Patient agreeable for bed>wheelchair transfer to eat lunch in wheelchair. Session focused on functional transfers, patient directing care/set up of wheelchair, and sitting balance. Therapist provided questioning cues for set up and patient able to answer/direct care appropriately for proper set up. Supine>sit with maxA with HOB elevated and use of bed rails, modA slideboard transfer, requires assist for board placement. Patient left sitting in wheelchair with seatbelt donned and all needs within reach. Vitals remain stable, see details below for vitals s/p transfer.  Therapy Documentation Precautions:  Precautions Precautions: Back;Fall Precaution Booklet Issued: No Precaution Comments: Back precautions reviewed (BAT) Restrictions Weight Bearing Restrictions: No Vital Signs: Therapy Vitals Pulse Rate: 114 (s/p slideboard transfer) Patient Position, if appropriate: Sitting Oxygen Therapy SpO2: 94 % O2 Device: Nasal cannula O2 Flow Rate (L/min): 3 L/min Pulse Oximetry Type: Intermittent Pain: Pain Assessment Pain Assessment: No/denies pain Pain Score: 0-No pain  See FIM for current functional status  Therapy/Group: Individual  Therapy  Lillia Abed. Aidyn Sportsman, PT, DPT 11/28/2013, 12:13 PM

## 2013-11-28 NOTE — Plan of Care (Signed)
Problem: SCI BLADDER ELIMINATION Goal: RH STG MANAGE BLADDER WITH ASSISTANCE STG Manage Bladder With mod Assistance  Outcome: Not Progressing Requires total assist

## 2013-11-28 NOTE — Progress Notes (Addendum)
Physical Therapy Session Note  Patient Details  Name: [name] MRN: [id number] Date of Birth: 1960/09/01  Today's Date: 11/28/2013 Time: 0905-1000 Time Calculation (min): 55 min  Skilled Therapeutic Interventions/Progress Updates:    Pt upset and reports she felt a lack of communication about missed therapy this weekend. Therapy supervisor made aware.   W/c propulsion x 150', 100' with supervision min cues, intermittent rest breaks needed for rest due to impaired endurance. Sliding board transfer w/c to/from car, elevated mat, and elevated bed (husband estimated home bed height). Pt requires min/mod assist for level or "downhill" transfers but max to +2 assist for low to high transfers (w/c >elevated mat, low car>w/c, w/c>elevated bed). With uphill transfers pt demonstrated two significant truncal losses of balance forwards onto therapist requiring max assist to correct. Needs mod cues throughout for back precautions.   All goals downgraded due to slower than expected progress.   SpO2 down to 92% on 4L, HR up to 125 bpm during activity, down to 109 at rest.    Therapy Documentation Precautions:  Precautions Precautions: Back;Fall Precaution Booklet Issued: No Precaution Comments: Back precautions reviewed (BAT) Restrictions Weight Bearing Restrictions: No Pain: Pain Assessment Pain Assessment: No/denies pain Pain Score: 0-No pain  See FIM for current functional status  Therapy/Group: Individual Therapy  [name], [name] 11/28/2013, 12:20 PM

## 2013-11-28 NOTE — Progress Notes (Signed)
[facility] PHYSICAL MEDICINE & REHABILITATION     PROGRESS NOTE    Subjective/Complaints: Feels better today and over weekend. Didn't get therapy yesterday which was disappointing.  A 12 point review of systems has been performed and if not noted above is otherwise negative.   Objective: Vital Signs: Blood pressure 114/74, pulse 99, temperature 98.4 F (36.9 C), temperature source Oral, resp. rate 18, height 5\' 2"  (1.575 m), weight 71.169 kg (156 lb 14.4 oz), SpO2 92.00%. No results found. No results found for this basename: WBC, HGB, HCT, PLT,  in the last 72 hours  Recent Labs  11/25/13 1240  NA 139  K 4.2  CL 100  GLUCOSE 199*  BUN 45*  CREATININE 0.96  CALCIUM 8.8   CBG (last 3)  No results found for this basename: GLUCAP,  in the last 72 hours  Wt Readings from Last 3 Encounters:  11/28/13 71.169 kg (156 lb 14.4 oz)  11/14/13 73.755 kg (162 lb 9.6 oz)  11/14/13 73.755 kg (162 lb 9.6 oz)    Physical Exam:  Constitutional: She is oriented to person, place, and time. She appears well-developed and well-nourished.  HENT: dentition fair. Oral mucosa pink/moist  Head: Normocephalic and atraumatic.  Eyes: Conjunctivae are normal.  Cardiovascular: Normal rate and regular rhythm. No murmur  Respiratory: No respiratory distress. She has crackles L>R GI: Soft. Bowel sounds are normal. She exhibits no distension. There is no tenderness.  Musculoskeletal: She exhibits no edema.  No calf tenderness Neurological: She is alert and oriented to person, place, and time.  Paraparesis with sensory deficits. DTR absent BLE. LLE tr to 1/5 HF,KE. Tr at ankle. RLE is 1+hf, 2-ke, 3 ankle. Sensation 1/2 LLE 1+ RLE. T8 Sensory level. Tone left hamstring/quad is 1+ /4---showing slow improvement Skin: Skin is warm and dry.  Psychiatric: She has a normal mood and affect. Her behavior is normal.    Assessment/Plan: 1. 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 Physiatrist is providing close team supervision and 24 hour management of active medical problems listed below. Physiatrist and rehab team continue to assess barriers to discharge/monitor patient progress toward functional and medical goals. FIM: FIM - Bathing Bathing Steps Patient Completed: Chest;Right Arm;Left Arm;Abdomen;Front perineal area;Buttocks;Right upper leg;Left upper leg;Right lower leg (including foot);Left lower leg (including foot) Bathing: 5: Set-up assist to: Open items  FIM - Upper Body Dressing/Undressing Upper body dressing/undressing steps patient completed: Thread/unthread right sleeve of pullover shirt/dresss;Thread/unthread left sleeve of pullover shirt/dress;Put head through opening of pull over shirt/dress;Pull shirt over trunk Upper body dressing/undressing: 0: Activity did not occur FIM - Lower Body Dressing/Undressing Lower body dressing/undressing steps patient completed: Thread/unthread right underwear leg;Thread/unthread right pants leg Lower body dressing/undressing: 0: Activity did not occur  FIM - Musician Devices: Grab bar or rail for support Toileting: 1: Two helpers  FIM - Radio producer Devices: Bedside commode;Sliding board (drop arm and short wood sliding board) Toilet Transfers: 1-Two helpers  FIM - Control and instrumentation engineer Devices: Sliding board Bed/Chair Transfer: 3: Sit > Supine: Mod A (lifting assist/Pt. 50-74%/lift 2 legs);2: Chair or W/C > Bed: Max A (lift and lower assist)  FIM - Locomotion: Wheelchair Distance: 150 Locomotion: Wheelchair: 5: Travels 150 ft or more: maneuvers on rugs and over door sills with supervision, cueing or coaxing FIM - Locomotion: Ambulation Locomotion: Ambulation Assistive Devices:  (  unable to perform  safely) Locomotion: Ambulation: 0: Activity did not occur  Comprehension Comprehension Mode: Auditory Comprehension: 5-Understands complex 90% of the time/Cues < 10% of the time  Expression Expression Mode: Verbal Expression: 5-Expresses complex 90% of the time/cues < 10% of the time  Social Interaction Social Interaction: 6-Interacts appropriately with others with medication or extra time (anti-anxiety, antidepressant).  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 DVT Prophylaxis/Anticoagulation: Mechanical: Antiembolism stockings, knee (TED hose) Bilateral lower extremities  Sequential compression devices, below knee Bilateral lower extremities  -dopplers negative.  2. Chronic pain due to fibromyalgia as well as back pain with neuropathy/ Pain Management: reduce oxycodone 5 mg qid  as well Lyrica 100 mg tid and cymbalta bid    -follow for activity tolerance 3. Depression with anxiety and panic attacks/Mood: Team to provide ego support. LCSW to follow for evaluation and support. Will continue cymbalta bid, xanax 0.5 mg tid as well as trazodone at bedtime for mood and insomnia.  4.Neuropsych: This patient is capable of making decisions on her own behalf.  5. Severe COPD: Continue oxygen 4 liters--used at home. Continue spiriva and dulera.    -albuterol changed to xopenex neb--added scheduled mucinex  .   -sputum culture nl.   -continue IS, FV, follow up cxr without change  -afebrile 6.Neurogenic Bowel and bladder :   On linzess but ineffective-- ileus resolving, advanced diet as tolerated  -reduced narcotics,     -Good results with SMOG   -senna    7. Chronic renal failure: Baseline BUN/Cr- 39/1.80 at admission. Near  baseline---continue current meds  -BUN/CR close to baseline---recheck tomorrow 8. Diastolic dysfunction with tachycardia (as compensatory for hypoxia)   -lasix resumed at 40mg  daily 9. Surgical- decadron taper slow (given COPD) 10. Bladder: I and O cath prn/ voiding trial  -continue low dose flomax  -UA neg, re-culture with multispecies   LOS (Days) 12 A FACE TO FACE EVALUATION WAS PERFORMED  [name] T 11/28/2013 8:44 AM

## 2013-11-28 NOTE — Progress Notes (Addendum)
Occupational Therapy Session Note  Patient Details  Name: [name] MRN: [id number] Date of Birth: February 28, 1961  Today's Date: 11/28/2013 Time: 1030-1130 Time Calculation (min): 60 min  Short Term Goals: Week 2:  OT Short Term Goal 1 (Week 2): Patient will complete slide board transfer from w/c to tub bench or BSC with Min A OT Short Term Goal 2 (Week 2): Pateint will demo ability to complete lower body dressing with min assist OT Short Term Goal 3 (Week 2): Patient will demo abilty to monitor symptoms of SOB and initiate energy conservation strategies as needed, independently during ADL OT Short Term Goal 4 (Week 2): Patient will perform light housekeeping task at w/c level with supervision for safety  Skilled Therapeutic Interventions/Progress Updates: ADL-retraining with focus on family education with husband Ron on slide board transfers, assisted bathing and dressing, static/dynamic sitting balance and energy conservation.   Patient received supine in bed with husband preparing to leave room.   Patient redirected husband to attend entire session to observe and attempt assistance throughout session.  Husband attempted to dismiss task as not necessary as he had assisted her prior to admission but patient clarified her physical status as being more impaired than before, with OT assist during conversation.  After vitals check (122/81, HR 105 bpm, 02 92%) patient completed assisted bed mobility, supine>sit at EOB with mod assist, slide board transfer to w/c with min assist and then rested 3 minutes.   Patient progressed from wc/ to bathroom for bathing in tub bench, completing slide board transfer to tub bench with mod assist with additional need to rest d/t elevated HR (from 112 to 125 bpm, 02 @ 92%).   Patient complained of "sore butt" despite using padded tub bench with peri cut-out while bathing and had poor awareness and poor ability to weight-shift to reduce discomfort.   Patient's endurance was  exhausted by end of shower and she required total assist slide board transfer back to w/c, mod assist to dress upper body while sitting in w/c and total assist to transfer to bed to perform lower body dressing (also total assist).   OT advised patient and spouse to not attempt transfers to their garden tub and shower chair for bathing without additional assistance or equipment for total assist transfers; spouse agreed that bed and sink baths could be accomplished at home as he had done this before without a problem.  Patient left supine in bed, husband attending to incidental care needs.     Therapy Documentation Precautions:  Precautions Precautions: Back;Fall Precaution Booklet Issued: No Precaution Comments: Back precautions reviewed (BAT) Restrictions Weight Bearing Restrictions: No  Vital Signs: Therapy Vitals Pulse Rate: 114 (s/p slideboard transfer) Patient Position, if appropriate: Sitting Oxygen Therapy SpO2: 94 % O2 Device: Nasal cannula O2 Flow Rate (L/min): 3 L/min Pulse Oximetry Type: Intermittent  Pain: Pain Assessment Pain Assessment: No/denies pain Pain Score: 0-No pain  ADL: ADL ADL Comments: see FIM  See FIM for current functional status  Therapy/Group: Individual Therapy  Second session: Time: 1345-1430 Time Calculation (min):  45 min  Pain Assessment: No/denies pain  Skilled Therapeutic Interventions: ADL-retraining with focus on family education and training with patient's husband on slide board transfers, in/out of bed, on/off drop-arm bedside commode.   Husband performed 2 transfers (to/from [facility] and to bed) with OT facilitating improved body mechanics emphasizing manual contact with patient's hips instead of lifting under her arms.   Patient able to direct sequence for transfer 75% independently this  session, achieving mod assist with her husband's participation.   See FIM for current functional status  Therapy/Group: Individual  Therapy  [place] 11/28/2013, 12:38 PM

## 2013-11-29 ENCOUNTER — Inpatient Hospital Stay (HOSPITAL_COMMUNITY): Payer: Medicare Other

## 2013-11-29 ENCOUNTER — Inpatient Hospital Stay (HOSPITAL_COMMUNITY): Payer: Medicare Other | Admitting: Physical Therapy

## 2013-11-29 ENCOUNTER — Ambulatory Visit (HOSPITAL_COMMUNITY): Payer: Medicare Other | Admitting: Physical Therapy

## 2013-11-29 DIAGNOSIS — D497 Neoplasm of unspecified behavior of endocrine glands and other parts of nervous system: Secondary | ICD-10-CM

## 2013-11-29 DIAGNOSIS — F341 Dysthymic disorder: Secondary | ICD-10-CM

## 2013-11-29 DIAGNOSIS — IMO0001 Reserved for inherently not codable concepts without codable children: Secondary | ICD-10-CM

## 2013-11-29 DIAGNOSIS — N189 Chronic kidney disease, unspecified: Secondary | ICD-10-CM

## 2013-11-29 DIAGNOSIS — J449 Chronic obstructive pulmonary disease, unspecified: Secondary | ICD-10-CM

## 2013-11-29 DIAGNOSIS — K560 Paralytic ileus: Secondary | ICD-10-CM

## 2013-11-29 LAB — BASIC METABOLIC PANEL WITH GFR
BUN: 38 mg/dL — ABNORMAL HIGH (ref 6–23)
CO2: 22 meq/L (ref 19–32)
Calcium: 8.4 mg/dL (ref 8.4–10.5)
Chloride: 102 meq/L (ref 96–112)
Creatinine, Ser: 0.84 mg/dL (ref 0.50–1.10)
GFR calc Af Amer: >90 mL/min (ref >90)
GFR calc non Af Amer: 79 mL/min — ABNORMAL LOW (ref >90)
Glucose, Bld: 167 mg/dL — ABNORMAL HIGH (ref 70–99)
Potassium: 4 meq/L (ref 3.7–5.3)
Sodium: 141 meq/L (ref 137–147)

## 2013-11-29 MED ORDER — NICOTINE 21 MG/24HR TD PT24
21.0000 mg | MEDICATED_PATCH | Freq: Every day | TRANSDERMAL | Status: DC
  Administered 2013-11-29 – 2013-12-08 (×10): 21 mg via TRANSDERMAL
  Filled 2013-11-29 (×11): qty 1

## 2013-11-29 NOTE — Progress Notes (Signed)
Recreational Therapy Session Note  Patient Details  Name: [name] MRN: [id number] Date of Birth: November 18, 1960 Today's Date: 11/29/2013  Pain: no c/o Skilled Therapeutic Interventions/Progress Updates: Session focused on leisure interest assessment, leisure education, discharge planning, energy conservation, & overall safety with leisure/mobility tasks.  Pt propelled w/c using BUE's with supervision-min assist for obstacle negotiation.  Specific focus on set up for kitchen safety  Therapy/Group: Co-Treatment   Sadik Piascik 11/29/2013, 2:32 PM

## 2013-11-29 NOTE — Progress Notes (Signed)
Physical Therapy Weekly Progress Note  Patient Details  Name: [name] MRN: [id number] Date of Birth: Oct 07, 1960  Beginning of progress report period: November 23, 2013 End of progress report period: November 29, 2013  Today's Date: 11/29/2013 Time: 8616-8372 Time Calculation (min): 20 min  Patient has met 2 of 3 short term goals.  Pt has demonstrated minimal neurological improvements and has demonstrated slower than expected gains due to exacerbations of comorbidities including COPD. Pt also developed severe constipation last week which also delayed progress. Pt is however making slow gains with sliding board transfers and decreasing level of assist required.   Patient continues to demonstrate the following deficits: paraparesis,  and therefore will continue to benefit from skilled PT intervention to enhance overall performance with activity tolerance, balance, postural control, ability to compensate for deficits, functional use of  right lower extremity and left lower extremity and knowledge of precautions.  Patient progressing toward long term goals slowly.  Plan of care revisions: goals have been downgraded.   PT Short Term Goals Week 2:  PT Short Term Goal 1 (Week 2): Pt will perform sliding board transer to slightly elevated surface with mod assist.  PT Short Term Goal 1 - Progress (Week 2): Met PT Short Term Goal 2 (Week 2): Pt will be able to self-direct w/c set-up and sliding board placement for sliding board transfers w/c to/from bed or mat.  PT Short Term Goal 2 - Progress (Week 2): Met PT Short Term Goal 3 (Week 2): Pt will demonstrate appropriate pressure relief program when seated in w/c.  PT Short Term Goal 3 - Progress (Week 2): Progressing toward goal  Skilled Therapeutic Interventions/Progress Updates:    Pt doing breathing treatment start of session and missed first 10 min. Rest of session focused on functional endurance during w/c propulsion x 250' total on unit over tiled  and carpeted surface with supervision intermittent rest breaks as needed. Stressed smoking cessation as pt intermittently feeling strong urges to go out and smoke. Discussed contingency plan for weakness once home, stressed importance of not trying sliding board transfers if she is weaker than normal due to risk of injury to herself or family. Recommended pt call EMS rather than risk transfer - pt verbalized understanding.    Therapy Documentation Precautions:  Precautions Precautions: Back;Fall Precaution Booklet Issued: No Precaution Comments: Back precautions reviewed (BAT) Restrictions Weight Bearing Restrictions: No General: Amount of Missed PT Time (min): 10 Minutes Missed Time Reason: Other (comment) (breathing treatment) Pain: No c/o  See FIM for current functional status  Therapy/Group: Individual Therapy  Ab[place]ola, Aki 11/29/2013, 3:23 PM

## 2013-11-29 NOTE — Progress Notes (Signed)
[facility] PHYSICAL MEDICINE & REHABILITATION     PROGRESS NOTE    Subjective/Complaints: No new problems. Excited that she stood for 5 minutes yesterday  A 12 point review of systems has been performed and if not noted above is otherwise negative.   Objective: Vital Signs: Blood pressure 132/81, pulse 87, temperature 98.3 F (36.8 C), temperature source Oral, resp. rate 19, height 5\' 2"  (1.575 m), weight 68.6 kg (151 lb 3.8 oz), SpO2 96.00%. No results found. No results found for this basename: WBC, HGB, HCT, PLT,  in the last 72 hours No results found for this basename: NA, K, CL, CO, GLUCOSE, BUN, CREATININE, CALCIUM,  in the last 72 hours CBG (last 3)  No results found for this basename: GLUCAP,  in the last 72 hours  Wt Readings from Last 3 Encounters:  11/29/13 68.6 kg (151 lb 3.8 oz)  11/14/13 73.755 kg (162 lb 9.6 oz)  11/14/13 73.755 kg (162 lb 9.6 oz)    Physical Exam:  Constitutional: She is oriented to person, place, and time. She appears well-developed and well-nourished.  HENT: dentition fair. Oral mucosa pink/moist  Head: Normocephalic and atraumatic.  Eyes: Conjunctivae are normal.  Cardiovascular: Normal rate and regular rhythm. No murmur  Respiratory: No respiratory distress. She has crackles L>R GI: Soft. Bowel sounds are normal. She exhibits no distension. There is no tenderness.  Musculoskeletal: She exhibits no edema.  No calf tenderness Neurological: She is alert and oriented to person, place, and time.  Paraparesis with sensory deficits. DTR absent BLE. LLE tr to 1/5 HF,KE. Tr at ankle. RLE is 1+hf, 2-ke, 3 ankle. Sensation 1/2 LLE 1+ RLE. T8 Sensory level. Tone left hamstring/quad is tr to 0 /4---showing slow improvement Skin: Skin is warm and dry.  Psychiatric: She has a normal mood and affect. Her behavior is normal.    Assessment/Plan: 1. Functional deficits secondary to T8 spinal tumor s/p resection with paraplegia/myelopathy which require 3+  hours per day of interdisciplinary therapy in a comprehensive inpatient rehab setting. Physiatrist is providing close team supervision and 24 hour management of active medical problems listed below. Physiatrist and rehab team continue to assess barriers to discharge/monitor patient progress toward functional and medical goals.  Husband in for family ed/observation.   FIM: FIM - Bathing Bathing Steps Patient Completed: Chest;Right Arm;Left Arm;Abdomen;Front perineal area;Buttocks;Right upper leg;Left upper leg;Right lower leg (including foot);Left lower leg (including foot) Bathing: 5: Set-up assist to: Open items  FIM - Upper Body Dressing/Undressing Upper body dressing/undressing steps patient completed: Thread/unthread right sleeve of pullover shirt/dresss;Thread/unthread left sleeve of pullover shirt/dress;Put head through opening of pull over shirt/dress;Pull shirt over trunk Upper body dressing/undressing: 0: Activity did not occur FIM - Lower Body Dressing/Undressing Lower body dressing/undressing steps patient completed: Thread/unthread right underwear leg;Thread/unthread right pants leg Lower body dressing/undressing: 0: Activity did not occur  FIM - Musician Devices: Grab bar or rail for support Toileting: 1: Two helpers  FIM - Radio producer Devices: Recruitment consultant Transfers: 2-To toilet/BSC: Max A (lift and lower assist);2-From toilet/BSC: Max A (lift and lower assist)  FIM - Bed/Chair Transfer Bed/Chair Transfer Assistive Devices: Sliding board;HOB elevated;Arm rests Bed/Chair Transfer: 3: Supine > Sit: Mod A (lifting assist/Pt. 50-74%/lift 2 legs;3: Sit > Supine: Mod A (lifting assist/Pt. 50-74%/lift 2 legs);3: Bed > Chair or W/C: Mod A (lift or lower assist);3: Chair or W/C > Bed: Mod A (lift or lower assist)  FIM - Locomotion: Wheelchair Distance: 150 Locomotion: Wheelchair:  0: Activity did not occur FIM -  Locomotion: Ambulation Locomotion: Ambulation Assistive Devices:  (unable to perform safely) Locomotion: Ambulation: 0: Activity did not occur  Comprehension Comprehension Mode: Auditory Comprehension: 5-Understands complex 90% of the time/Cues < 10% of the time  Expression Expression Mode: Verbal Expression: 5-Expresses complex 90% of the time/cues < 10% of the time  Social Interaction Social Interaction: 6-Interacts appropriately with others with medication or extra time (anti-anxiety, antidepressant).  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 DVT Prophylaxis/Anticoagulation: Mechanical: Antiembolism stockings, knee (TED hose) Bilateral lower extremities  Sequential compression devices, below knee Bilateral lower extremities  -dopplers negative.  2. Chronic pain due to fibromyalgia as well as back pain with neuropathy/ Pain Management: reduce oxycodone 5 mg qid  as well Lyrica 100 mg tid and cymbalta bid    -follow for activity tolerance 3. Depression with anxiety and panic attacks/Mood: Team to provide ego support. LCSW to follow for evaluation and support. Will continue cymbalta bid, xanax 0.5 mg tid as well as trazodone at bedtime for mood and insomnia.  4.Neuropsych: This patient is capable of making decisions on her own behalf.  5. Severe COPD: Continue oxygen 4 liters--used at home. Continue spiriva and dulera.    -albuterol changed to xopenex neb--added scheduled mucinex  .   -sputum culture nl.   -continue IS, FV,    -afebrile 6.Neurogenic Bowel and bladder :   On linzess but ineffective-- ileus resolving, advanced diet as tolerated  -reduced narcotics,     -Good results with SMOG   -senna    7. Chronic  renal failure: Baseline BUN/Cr- 39/1.80 at admission. Near baseline---continue current meds  -BUN/CR close to baseline---recheck wed 8. Diastolic dysfunction with tachycardia (as compensatory for hypoxia)   -lasix resumed at 40mg  daily 9. Surgical- decadron taper   10. Bladder: I and O cath prn/ voiding trial  -continue low dose flomax  -UA neg, re-culture with multispecies   LOS (Days) 13 A FACE TO FACE EVALUATION WAS PERFORMED  Kagen Kunath T 11/29/2013 8:20 AM

## 2013-11-29 NOTE — Progress Notes (Signed)
Patient requesting to go outside and reported she needed fresh air and wanted to smoke. Instructed patient this is a nonsmoking facility and smoking is prohibited especially since she was using chronic oxygen . P. Love , PA notified. Continue plan of care .            [name]

## 2013-11-29 NOTE — Progress Notes (Signed)
Physical Therapy Session Note  Patient Details  Name: [name] MRN: [id number] Date of Birth: 1961/07/06  Today's Date: 11/29/2013 Time: 0830-0930 Time Calculation (min): 60 min  Short Term Goals: Week 2:  PT Short Term Goal 1 (Week 2): Pt will perform sliding board transer to slightly elevated surface with mod assist.  PT Short Term Goal 2 (Week 2): Pt will be able to self-direct w/c set-up and sliding board placement for sliding board transfers w/c to/from bed or mat.  PT Short Term Goal 3 (Week 2): Pt will demonstrate appropriate pressure relief program when seated in w/c.   Skilled Therapeutic Interventions/Progress Updates:    First half session focused on sliding board transfer (x3) to variety of surfaces and trial of lower w/c for improved fit. Pt requires min/mod assist "downhill" and heavy mod/max assist "uphilll." PT facilitating and cueing for posterior shift of pelvis and anterior translation of shoulders. Cues for hand placement throughout. Pt able to direct w/c set-up and sliding board placement with min cues. Second half treatment co-treat with rec therapy focused on w/c mobility in the home environment (min assist for obstacle avoidance and navigation in tight spaces) and long discussion on energy conservation, ways to continue participating in hobbies, and problem solving through ways to continue to participate in enjoyable activities with family. See Rec Therapy note for further details.   Therapy Documentation Precautions:  Precautions Precautions: Back;Fall Precaution Booklet Issued: No Precaution Comments: Back precautions reviewed (BAT) Restrictions Weight Bearing Restrictions: No Vital Signs: Oxygen Therapy SpO2: 94 % O2 Device: Nasal cannula O2 Flow Rate (L/min): 4 L/min Pain:  no c/o  See FIM for current functional status  Therapy/Group: Co-Treatment with rec therapy  [place], [place] 11/29/2013, 10:18 AM

## 2013-11-29 NOTE — Progress Notes (Signed)
Occupational Therapy Session Note  Patient Details  Name: [name] MRN: [id number] Date of Birth: 03-17-1961  Today's Date: 11/29/2013 Time: 1030-1130 Time Calculation (min): 60 min  Short Term Goals: Week 2:  OT Short Term Goal 1 (Week 2): Patient will complete slide board transfer from w/c to tub bench or BSC with Min A OT Short Term Goal 2 (Week 2): Pateint will demo ability to complete lower body dressing with min assist OT Short Term Goal 3 (Week 2): Patient will demo abilty to monitor symptoms of SOB and initiate energy conservation strategies as needed, independently during ADL OT Short Term Goal 4 (Week 2): Patient will perform light housekeeping task at w/c level with supervision for safety  Skilled Therapeutic Interventions/Progress Updates: ADL-retraining with focus on performance of ADL supine, bed-level, and sitting at edge of bed.   Patient received in w/c and receptive to ADL session.  With setup assist patient was able to complete upper body bathing and dressing, unassisted.   Patient performed min assist slide board transfer to edge of bed and required max assist with bed mobility to lay supine in prep for supine lower body bathing and dressing.   Patient able to complete partial rolls, right >< left, as needed to wash peri-area and buttocks and required mod assist to lace underwear and pants after setup with TEDs.    Patient reports awareness of discharge planning with husband preparing to build ramp and completing minor home mods to accommodate hospital bed and wheelchair in their single-wide mobile home.   Patient left supine in bed, HOB elevated with call button and phone within reach.     Therapy Documentation Precautions:  Precautions Precautions: Back;Fall Precaution Booklet Issued: No Precaution Comments: Back precautions reviewed (BAT) Restrictions Weight Bearing Restrictions: No  Vital Signs: Oxygen Therapy SpO2: 94 % O2 Device: Nasal cannula O2 Flow Rate  (L/min): 4 L/min  Pain: Pain Assessment Pain Assessment: 0-10 Pain Score: 8  Pain Type: Acute pain Pain Location: Back Pain Orientation: Upper;Mid Pain Descriptors / Indicators: Aching Pain Frequency: Constant Pain Onset: On-going Patients Stated Pain Goal: 5 Pain Intervention(s): Medication (See eMAR) (oxycodone 5mg  po)  ADL: ADL ADL Comments: see FIM  See FIM for current functional status  Therapy/Group: Individual Therapy  Second session: Time: 4010-2725 Time Calculation (min):  45 min  Pain Assessment: 5/10  Skilled Therapeutic Interventions: ADL-retraining with focus on family education and retraining on slide board transfer.   Patient's husband present to assist with all bed mobility and complete transfer out of bed, rest, and on drop-arm BSC.  Patient able to complete lateral leans to pull down her pants with min assist from husband.   After training patient repeated requests to leave floor to smoke "just one cigarette."   During discourse with her husband, OT observe husband as very distressed, crying, and attempting to redirect and correct patient compassionately.   Despite reinforcement and re-ed from OT and patient's husband, patient also became labile and was left in room with her husband pleaded for assist with smoking.   RN notified; RN contacted PA-C for further counseling with patient.   See FIM for current functional status  Therapy/Group: Individual Therapy  [place] 11/29/2013, 1:53 PM

## 2013-11-29 NOTE — Progress Notes (Signed)
Recreational Therapy Assessment and Plan  Patient Details  Name: [name] MRN: [id number] Date of Birth: 03-03-1961 Today's Date: 11/29/2013  Rehab Potential: Good ELOS: 2 weeks   Assessment Clinical Impression: Problem List:  Patient Active Problem List    Diagnosis  Date Noted   .  Thoracic spine tumor  11/16/2013   .  Urinary retention  11/12/2013   .  Left leg weakness  11/12/2013   .  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  Dyspnea  10/18/2013   .  Right upper lobe pneumonia/infiltrate  09/20/2013   .  Lichen planus vulva  09/20/2013   .  Thoracic compression fracture  09/20/2013   .  COPD exacerbation  09/19/2013   .  Acute delirium  09/07/2013   .  Chronic diastolic congestive heart failure, NYHA class 1  09/07/2013   .  Hypernatremia  09/06/2013   .  Shingles  09/06/2013   .  History of depression, chronic antidepressants  09/06/2013   .  Physical deconditioning  09/06/2013   .  Hypercarbia, acute (resolved) on chronic  09/02/2013   .  Acute-on-chronic respiratory failure  09/02/2013   .  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  HYPERTRIGLYCERIDEMIA  09/16/2006   .  History of anxiety - chronic alprazolam  06/10/2006   .  TOBACCO ABUSE  06/10/2006   .  COPD without acute bronchospasm  06/10/2006   .  PULMONARY NODULE  06/10/2006   .  GERD, chronic PPI use  06/10/2006   .  LOW BACK PAIN  06/10/2006   .  FIBROMYALGIA  06/10/2006   .  SLEEP APNEA  06/10/2006   .  CERVICAL CANCER, HX OF  06/10/2006   .  HYSTERECTOMY, HX OF  06/10/2006    Past Medical History:  Past Medical History   Diagnosis  Date   .  Right-sided heart failure    .  COPD (chronic obstructive pulmonary disease)    .  Emphysema    .  Seizures    .  Thyroid disease    .  Depression    .  Emphysema    .  Arthritis      back and legs   .  Blood transfusion without reported diagnosis    .  High cholesterol    .  Anxiety    .  Nerves    .   Panic attack    .  Thyroid disorder    .  Hypothyroidism    .  Pneumonia    .  CHF (congestive heart failure)    .  On home O2      4L N/C chronic   .  Fibromyalgia     Past Surgical History:  Past Surgical History   Procedure  Laterality  Date   .  Abdominal hysterectomy     .  Cesarean section     .  Cholecystectomy     .  Laser removal condyloma     .  Laminectomy  N/A  11/12/2013     Procedure: THORACIC LAMINECTOMY FOR MASS; Surgeon: Eustace Moore, MD; Location: [facility]; Service: Neurosurgery; Laterality: N/A;    Assessment & Plan  Clinical Impression: [name] is a 53 y.o. female with history of severe COPD--oxygen dependent, FMS, multiple episodes of PNA this winter, chronic thoracic pain with compression fracture who was admitted on 11/11/13 with  complaints of urinary retention as well as progressive weakness in BLE with inability to walk for a week. Patient reported epidural injection approx 9 days prior to admission with onset of LE numbness in 48 hours and progressive symptoms. MRI L-spine negative for stenosis and Dr. Ronnald Ramp consulted for input. He recommended MRI T-spine which showed "Abnormal structures within the spinal canal at the T8 level, larger on the left than on the right, which appear to compress the spinal cord". She was cleared for surgery by pulmonary and was taken to OR on 11/12/13 for T8 thoracic epidural mass resection. Pathology with fibroadipose tissue with fat necrosis as well as degenerative cartilaginous tissue with abundant necrosis. Extubated on 11/13/13 and therapy initiated. Patient transferred to CIR on 11/16/2013.   Pt presents with decreased activity tolerance, decreased oxygen support, decreased functional mobility, decreased balance, decreased coordination Limiting pt's independence with leisure/community pursuits.   Leisure History/Participation Premorbid leisure interest/current participation: Games - Government social research officer Other  Leisure Interests: Television;Cooking/Baking;Housework Leisure Participation Style: With Family/Friends Awareness of Community Resources: Good-identify 3 post discharge leisure resources Psychosocial / Spiritual Social interaction - Mood/Behavior: Cooperative Academic librarian Appropriate for Education?: Yes Recreational Therapy Orientation Orientation -Reviewed with patient: Available activity resources Strengths/Weaknesses Patient Strengths/Abilities: Willingness to participate Patient weaknesses: Physical limitations TR Patient demonstrates impairments in the following area(s): Edema;Endurance;Motor;Pain;Safety;Sensory  Plan Rec Therapy Plan Is patient appropriate for Therapeutic Recreation?: Yes Rehab Potential: Good Treatment times per week: Min 1 time per week >20 minutes Estimated Length of Stay: 2 weeks TR Treatment/Interventions: Adaptive equipment instruction;1:1 session;Balance/vestibular training;Functional mobility training;Community reintegration;Patient/family education;Therapeutic activities;Recreation/leisure participation;Therapeutic exercise;UE/LE Coordination activities;Wheelchair propulsion/positioning  Recommendations for other services: None  Discharge Criteria: Patient will be discharged from TR if patient refuses treatment 3 consecutive times without medical reason.  If treatment goals not met, if there is a change in medical status, if patient makes no progress towards goals or if patient is discharged from hospital.  The above assessment, treatment plan, treatment alternatives and goals were discussed and mutually agreed upon: by patient  [place] 11/29/2013, 2:24 PM

## 2013-11-30 ENCOUNTER — Ambulatory Visit (HOSPITAL_COMMUNITY): Payer: Medicare Other | Admitting: *Deleted

## 2013-11-30 ENCOUNTER — Inpatient Hospital Stay (HOSPITAL_COMMUNITY): Payer: Medicare Other

## 2013-11-30 DIAGNOSIS — J449 Chronic obstructive pulmonary disease, unspecified: Secondary | ICD-10-CM

## 2013-11-30 DIAGNOSIS — K560 Paralytic ileus: Secondary | ICD-10-CM

## 2013-11-30 DIAGNOSIS — D497 Neoplasm of unspecified behavior of endocrine glands and other parts of nervous system: Secondary | ICD-10-CM

## 2013-11-30 DIAGNOSIS — F341 Dysthymic disorder: Secondary | ICD-10-CM

## 2013-11-30 DIAGNOSIS — IMO0001 Reserved for inherently not codable concepts without codable children: Secondary | ICD-10-CM

## 2013-11-30 DIAGNOSIS — N189 Chronic kidney disease, unspecified: Secondary | ICD-10-CM

## 2013-11-30 LAB — BASIC METABOLIC PANEL
BUN: 37 mg/dL — AB (ref 6–23)
CALCIUM: 8.3 mg/dL — AB (ref 8.4–10.5)
CO2: 24 meq/L (ref 19–32)
CREATININE: 0.97 mg/dL (ref 0.50–1.10)
Chloride: 105 mEq/L (ref 96–112)
GFR calc Af Amer: 77 mL/min — ABNORMAL LOW (ref >90)
GFR, EST NON AFRICAN AMERICAN: 66 mL/min — AB (ref >90)
Glucose, Bld: 189 mg/dL — ABNORMAL HIGH (ref 70–99)
Potassium: 4.6 mEq/L (ref 3.7–5.3)
Sodium: 143 mEq/L (ref 137–147)

## 2013-11-30 MED ORDER — DEXAMETHASONE 4 MG PO TABS
4.0000 mg | ORAL_TABLET | Freq: Two times a day (BID) | ORAL | Status: DC
  Administered 2013-11-30 – 2013-12-09 (×17): 4 mg via ORAL
  Filled 2013-11-30 (×23): qty 1

## 2013-11-30 NOTE — Progress Notes (Addendum)
[facility]     PROGRESS NOTE    Subjective/Complaints: Some anxiety spells yesterday. Had urge to smoke. Pt denies smoking outside although she wanted to. Breathing remains good. A 12 point review of systems has been performed and if not noted above is otherwise negative.   Objective: Vital Signs: Blood pressure 132/86, pulse 95, temperature 98.2 F (36.8 C), temperature source Oral, resp. rate 17, height 5\' 2"  (1.575 m), weight 70.4 kg (155 lb 3.3 oz), SpO2 93.00%. No results found. No results found for this basename: WBC, HGB, HCT, PLT,  in the last 72 hours  Recent Labs  11/29/13 0735  NA 141  K 4.0  CL 102  GLUCOSE 167*  BUN 38*  CREATININE 0.84  CALCIUM 8.4   CBG (last 3)  No results found for this basename: GLUCAP,  in the last 72 hours  Wt Readings from Last 3 Encounters:  11/30/13 70.4 kg (155 lb 3.3 oz)  11/14/13 73.755 kg (162 lb 9.6 oz)  11/14/13 73.755 kg (162 lb 9.6 oz)    Physical Exam:  Constitutional: She is oriented to person, place, and time. She appears well-developed and well-nourished.  HENT: dentition fair. Oral mucosa pink/moist  Head: Normocephalic and atraumatic.  Eyes: Conjunctivae are normal.  Cardiovascular: Normal rate and regular rhythm. No murmur  Respiratory: No respiratory distress. She has crackles L>R GI: Soft. Bowel sounds are normal. She exhibits no distension. There is no tenderness.  Musculoskeletal: She exhibits no edema.  No calf tenderness Neurological: She is alert and oriented to person, place, and time.  Paraparesis with sensory deficits. DTR absent BLE. LLE tr to 1/5 HF,KE. Tr at ankle. RLE is 1+hf, 2-ke, 3 ankle. Sensation 1/2 LLE 1+ RLE. T8 Sensory level. Tone left hamstring/quad is tr to 0 /4---showing slow improvement Skin: Skin is warm and dry.  Psychiatric: She has a normal mood and affect. Her behavior is normal.    Assessment/Plan: 1. 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 Physiatrist is providing close team supervision and 24 hour management of active medical problems listed below. Physiatrist and rehab team continue to assess barriers to discharge/monitor patient progress toward functional and medical goals.  Husband in for family ed/observation.   FIM: FIM - Bathing Bathing Steps Patient Completed: Chest;Right Arm;Left Arm;Abdomen;Front perineal area;Buttocks;Right upper leg;Left upper leg;Right lower leg (including foot);Left lower leg (including foot) Bathing: 5: Set-up assist to: Open items  FIM - Upper Body Dressing/Undressing Upper body dressing/undressing steps patient completed: Thread/unthread right sleeve of pullover shirt/dresss;Thread/unthread left sleeve of pullover shirt/dress;Put head through opening of pull over shirt/dress;Pull shirt over trunk Upper body dressing/undressing: 0: Activity did not occur FIM - Lower Body Dressing/Undressing Lower body dressing/undressing steps patient completed: Thread/unthread right underwear leg;Thread/unthread right pants leg Lower body dressing/undressing: 0: Activity did not occur  FIM - Musician Devices: Grab bar or rail for support Toileting: 1: Two helpers  FIM - Radio producer Devices: Recruitment consultant Transfers: 2-To toilet/BSC: Max A (lift and lower assist);2-From toilet/BSC: Max A (lift and lower assist)  FIM - Bed/Chair Transfer Bed/Chair Transfer Assistive Devices: Sliding board;HOB elevated;Arm rests Bed/Chair Transfer: 3: Supine > Sit: Mod A (lifting assist/Pt. 50-74%/lift 2 legs;3: Sit > Supine: Mod A (lifting assist/Pt. 50-74%/lift 2 legs);3: Bed > Chair or W/C: Mod A (lift or lower assist);3: Chair or W/C >  Bed: Mod A (lift or lower assist)  FIM - Locomotion:  Wheelchair Distance: 150 Locomotion: Wheelchair: 0: Activity did not occur FIM - Locomotion: Ambulation Locomotion: Ambulation Assistive Devices:  (unable to perform safely) Locomotion: Ambulation: 0: Activity did not occur  Comprehension Comprehension Mode: Auditory Comprehension: 5-Understands complex 90% of the time/Cues < 10% of the time  Expression Expression Mode: Verbal Expression: 5-Expresses complex 90% of the time/cues < 10% of the time  Social Interaction Social Interaction: 6-Interacts appropriately with others with medication or extra time (anti-anxiety, antidepressant).  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 DVT Prophylaxis/Anticoagulation: Mechanical: Antiembolism stockings, knee (TED hose) Bilateral lower extremities  Sequential compression devices, below knee Bilateral lower extremities  -dopplers negative.  2. Chronic pain due to fibromyalgia as well as back pain with neuropathy/ Pain Management: reduce oxycodone 5 mg qid  as well Lyrica 100 mg tid and cymbalta bid    -follow for activity tolerance 3. Depression with anxiety and panic attacks/Mood: Team to provide ego support. LCSW to follow for evaluation and support. Will continue cymbalta bid, xanax 0.5 mg tid as well as trazodone at bedtime for mood and insomnia.  4.Neuropsych: This patient is capable of making decisions on her own behalf.  5. Severe COPD: Continue oxygen 4 liters--used at home. Continue spiriva and dulera.    -albuterol changed to xopenex neb--  - scheduled mucinex  .   -sputum culture nl.   -continue IS, FV,    -had discussion with patient regarding tobacco use also 6.Neurogenic Bowel and bladder :   On linzess but  ineffective-- ileus resolving, advanced diet as tolerated  -reduced narcotics,     -Good results with SMOG   -senna    7. Chronic renal failure: Baseline BUN/Cr- 39/1.80 at admission. Near baseline---continue current meds  -BUN/CR close to baseline  8. Diastolic dysfunction with tachycardia (as compensatory for hypoxia)   -lasix resumed at 40mg  daily 9. Surgical- decadron taper   10. Bladder: I and O cath prn/ voiding trial---may need to go home with a foley  -continue low dose flomax  -pseudomonas in urine--macrobid--follow up urine with multispecies---dc abx   LOS (Days) 14 A FACE TO FACE EVALUATION WAS PERFORMED  Meredith Staggers 11/30/2013 8:26 AM

## 2013-11-30 NOTE — Progress Notes (Signed)
Physical Therapy Session Note  Patient Details  Name: [name] MRN: [id number] Date of Birth: 1960-11-04  Today's Date: 11/30/2013 Time: Treatment Session 1: 2878-6767; Treatment Session 2: 1445-1520  Time Calculation (min): Treatment Session 1: 69min; Treatment Session 2: 26min  Skilled Therapeutic Interventions/Progress Updates:  Treatment Session 1:  1:1. Pt missed 14min at start of session due to nursing care, RN requested that pt be in bed. Pt received sitting in w/c, able to direct care for unlevel SBT w/c<>bed w/ mod A as well as t/f sup<>sit w/ mod A and use of hospital bed functions. Pt propelled w/c 75'x2 w/ B UE to target functional endurance, req cueing to take breaks for energy conservation. Pt able to manage w/c in kitchen during inventory task to prepare for future cooking activity w/ min assist/cueing. Pt able to direct SBT w/c<>car w/ min cues for correction, req mod-max A and cued to lean into therapist to promote improved bottom clearance. Pt sitting in w/c at end of session w/ all needs in reach.  Focus this session on SBTs, functional endurance as well as w/c management in kitchen environment.   Treatment Session 2:  1:1. Pt received sitting in w/c, ready for therapy. Pt propelled w/c room>therapy gym w/ overall (S) to target functional endurance. SaO2 88% following w/c propulsion, but quickly resolved >90% w/ seated rest. Pt w/ excellent tolerance to standing frame to target increased WB through B LE, weight shifting and improved upright posture while playing rounds of tic-tac-toe and hang man. Pt sitting in w/c at end of session w/ all needs in reach.   Therapy Documentation Precautions:  Precautions Precautions: Back;Fall Precaution Booklet Issued: No Precaution Comments: Back precautions reviewed (BAT) Restrictions Weight Bearing Restrictions: No General: Amount of Missed PT Time (min): 10 Minutes Missed Time Reason: Nursing care Vital Signs: Therapy  Vitals Pulse Rate: 102 Resp: 18 Patient Position, if appropriate: Sitting Oxygen Therapy SpO2: 92 % O2 Device: Nasal cannula O2 Flow Rate (L/min): 4 L/min Pain: Pain Assessment Pain Assessment: 0-10 Pain Score: 6  Pain Type: Surgical pain Pain Location: Back Pain Orientation: Mid;Lower Pain Descriptors / Indicators: Aching Pain Onset: On-going Pain Intervention(s): Medication (See eMAR)  See FIM for current functional status  Therapy/Group: Individual Therapy  Gilmore Laroche 11/30/2013, 11:29 AM

## 2013-11-30 NOTE — Progress Notes (Signed)
Recreational Therapy Session Note  Patient Details  Name: [name] MRN: [id number] Date of Birth: Feb 03, 1961 Today's Date: 11/30/2013  Pain: c/o 5/10 back pain, RN aware, meds given Skilled Therapeutic Interventions/Progress Updates: Session focused on activity tolerance, slide board transfers including car transfer, simple meal prep planning session seated w/c level. Pt required mod assist for transfers, supervision for w/c mobility and accessing items in refrigerator from w/c level   Therapy/Group: Co-Treatment   Waldon Reining 11/30/2013, 10:25 AM

## 2013-11-30 NOTE — Progress Notes (Signed)
Occupational Therapy Session Note  Patient Details  Name: [name] MRN: [id number] Date of Birth: 27-Jan-1961  Today's Date: 11/30/2013 Time: 0730-0830 Time Calculation (min): 60 min  Short Term Goals: Week 2:  OT Short Term Goal 1 (Week 2): Patient will complete slide board transfer from w/c to tub bench or BSC with Min A OT Short Term Goal 2 (Week 2): Pateint will demo ability to complete lower body dressing with min assist OT Short Term Goal 3 (Week 2): Patient will demo abilty to monitor symptoms of SOB and initiate energy conservation strategies as needed, independently during ADL OT Short Term Goal 4 (Week 2): Patient will perform light housekeeping task at w/c level with supervision for safety  Skilled Therapeutic Interventions/Progress Updates: ADL-retraining with focus on improved endurance, transfers, and adapted bathing/dressing skills while supine in bed.   Patient received supine in bed, HOB elevated and receptive for therapy however with no clean clothes available for planned ADL session.   Patient agreed to continue with bathing/dressing tasks using gown and her unsoiled pants as needed for slide board transfer to w/c.   Patient requested nutrition prior to exertion and quickly consumed her cereal and milk using overbed table with min setup assist.   Patient then progressed through bathing session, performing rolls, right/left, to wash buttocks with facilitation to flex hips/knee for improved trunk rotation.   Patient required only min assist to wash her feet.   Patient remains most challenged with lower body dressing tasks (currently total assist) due to large stomach girth and weak grip strength.   Patient completed upper body dressing at edge of bed and transfer to w/c with only min assist after setup using slide board.   Patient left at sink in w/c, brushing her teeth and hair with call light within reach.     Therapy Documentation Precautions:  Precautions Precautions:  Back;Fall Precaution Booklet Issued: No Precaution Comments: Back precautions reviewed (BAT) Restrictions Weight Bearing Restrictions: No  General: General Missed Time Reason: Nursing care  Pain: Pain Assessment Pain Assessment: No/denies pain  ADL: ADL ADL Comments: see FIM  See FIM for current functional status  Therapy/Group: Individual Therapy  Second session: Time: 1345-1415 Time Calculation (min):  30 min  Pain Assessment: No/denies pain  Skilled Therapeutic Interventions: Therex with focus on BUE strengthening using thera-band.  Patient performed seated UE exercises, 10 reps each, but required modification of scapular pull-downs w/o use of band and manual resistance instead of band during triceps extension exercise d/t compensatory scapular elevation during shoulder flexion.   See FIM for current functional status  Therapy/Group: Individual Therapy  Salome Spotted 11/30/2013, 10:07 AM

## 2013-12-01 ENCOUNTER — Inpatient Hospital Stay (HOSPITAL_COMMUNITY): Payer: Medicare Other | Admitting: Physical Therapy

## 2013-12-01 ENCOUNTER — Ambulatory Visit: Payer: Medicare Other | Admitting: Obstetrics & Gynecology

## 2013-12-01 ENCOUNTER — Encounter (HOSPITAL_COMMUNITY): Payer: Medicare Other

## 2013-12-01 ENCOUNTER — Inpatient Hospital Stay (HOSPITAL_COMMUNITY): Payer: Medicare Other

## 2013-12-01 DIAGNOSIS — D492 Neoplasm of unspecified behavior of bone, soft tissue, and skin: Secondary | ICD-10-CM

## 2013-12-01 NOTE — Progress Notes (Signed)
Recreational Therapy Session Note  Patient Details  Name: [name] MRN: [id number] Date of Birth: November 25, 1960 Today's Date: 12/01/2013  Pain: no c/o Skilled Therapeutic Interventions/Progress Updates: Session focused on kitchen task/clean up after simple meal prep at w/c level with supervision. Pt performed activity seated at w/c level with set-up/supervision.  Therapy/Group: Individual Therapy   Waldon Reining 12/01/2013, 3:53 PM

## 2013-12-01 NOTE — Progress Notes (Signed)
Physical Therapy Note  Patient Details  Name: [name] MRN: [id number] Date of Birth: 29-Oct-1960 Today's Date: 12/01/2013 1035-1105, 30 min No pain reported except at coccyx during transfer, resolved as soon as she is off SB  W/c propulsion using bil UEs x 150' with supervision.  Pt anxious, unsure why she needs to practice further transfers especially car transfers.  Emotional support provided and purpose of transfer training explained.  SBT to simulated car at lowest seat height, uphill approx. 2" , with max cues for w/c set-up, safety, thinking through steps, and max assist for transfer including bil LEs.  Pt c/o coccyx sore when she is on SB.  Mod assist downhill transfer car> w/c.  Therapist requested pt ask her husband to measure ground- to- seat- height at lowest setting of car pt will d/c home in.  Frederic Jericho 12/01/2013, 7:52 AM

## 2013-12-01 NOTE — Progress Notes (Signed)
Physical Therapy Session Note  Patient Details  Name: MATASHA SMIGELSKI MRN: [id number] Date of Birth: 01-09-1961  Today's Date: 12/01/2013 Time: 1740-8144 Time Calculation (min): 60 min  Skilled Therapeutic Interventions/Progress Updates:    Pain: 6/10 premedicated.  Session primarily focused on sliding board transfers w/c to/from elevated surface with heavy mod assist to elevated surface, min assist to lower surface. Pt able to direct set-up with mod verbal cues (cues needed for appropriate leg rest management, w/c set-up and proximity. Pt needs physical assist to place and remove board. 3 transfers each direction (6 total) performed. W/C propulsion for generalized strengthening and endurance x 300' with supervision, obstacle course over carpet performed with mod verbal cues as pt struggles to clear obstacles due to bil UE weakness.    Therapy Documentation Precautions:  Precautions Precautions: Back;Fall Precaution Booklet Issued: No Precaution Comments: Back precautions reviewed (BAT) Restrictions Weight Bearing Restrictions: No  See FIM for current functional status  Therapy/Group: Individual Therapy  ALYRIA KRACK 12/01/2013, 9:51 AM

## 2013-12-01 NOTE — Progress Notes (Signed)
Occupational Therapy Weekly Progress Note  Patient Details  Name: [name] MRN: [id number] Date of Birth: 20-Oct-1960  Beginning of progress report period: November 23, 2013 End of progress report period: November 30, 2013  Today's Date: 12/01/2013  Patient has met 1 of 4 short term goals.  Patient is progressing toward remaining 3 of 4 2nd week short-term goals.  Patient continues to demonstrate the following deficits: Endurance, intellectual and anticipatory awareness, dynamic sitting balance, transfer skills, and family education and therefore will continue to benefit from skilled OT intervention to enhance overall performance with BADL.  Patient progressing toward long term goals..  Continue plan of care.  OT Short Term Goals Week 2:  OT Short Term Goal 1 (Week 2): Patient will complete slide board transfer from w/c to tub bench or BSC with Min A OT Short Term Goal 1 - Progress (Week 2): Progressing toward goal OT Short Term Goal 2 (Week 2): Pateint will demo ability to complete lower body dressing with min assist OT Short Term Goal 2 - Progress (Week 2): Progressing toward goal OT Short Term Goal 3 (Week 2): Patient will demo abilty to monitor symptoms of SOB and initiate energy conservation strategies as needed, independently during ADL OT Short Term Goal 3 - Progress (Week 2): Met OT Short Term Goal 4 (Week 2): Patient will perform light housekeeping task at w/c level with supervision for safety OT Short Term Goal 4 - Progress (Week 2): Progressing toward goal Week 3:  OT Short Term Goal 1 (Week 3): STG=LTG due to anticipated discharge to home within 7 days  Therapy Documentation Precautions:  Precautions Precautions: Back;Fall Precaution Booklet Issued: No Precaution Comments: Back precautions reviewed (BAT) Restrictions Weight Bearing Restrictions: No  Vital Signs: Therapy Vitals Temp: 97 F (36.1 C) Temp src: Oral Pulse Rate: 89 Resp: 18 BP: 131/83 mmHg Patient  Position, if appropriate: Lying Oxygen Therapy SpO2: 98 % O2 Device: Nasal cannula O2 Flow Rate (L/min): 4 L/min  Pain: Pain Assessment Pain Assessment: 0-10 Pain Score: 7  Pain Type: Surgical pain Pain Location: Back Pain Orientation: Mid;Lower Pain Descriptors / Indicators: Aching Pain Onset: On-going Patients Stated Pain Goal: 4 Pain Intervention(s): Medication (See eMAR)  ADL: ADL ADL Comments: see FIM   Salome Spotted 12/01/2013, 7:52 AM

## 2013-12-01 NOTE — Progress Notes (Signed)
Pt I/O cathed at 1330 for 1000cc; Assisted to [facility] first to attempt to void. 7.5 hour period due to pt.wanting to delay for various reasons; angry affect at times. Assisted to [facility] first to attempt to void.  Discussed importance of schedules/ routine in regards to B/B programs  I/O cathed at 1900 for 600cc; fleets given; report to eve shift.

## 2013-12-01 NOTE — Progress Notes (Signed)
[facility] PHYSICAL MEDICINE & REHABILITATION     PROGRESS NOTE    Subjective/Complaints: No new issues. Bladder still not emptying. Tolerated therapy fairly well yesterday A 12 point review of systems has been performed and if not noted above is otherwise negative.   Objective: Vital Signs: Blood pressure 131/83, pulse 89, temperature 97 F (36.1 C), temperature source Oral, resp. rate 18, height 5\' 2"  (1.575 m), weight 69.4 kg (153 lb), SpO2 98.00%. No results found. No results found for this basename: WBC, HGB, HCT, PLT,  in the last 72 hours  Recent Labs  11/29/13 0735 11/30/13 0625  NA 141 143  K 4.0 4.6  CL 102 105  GLUCOSE 167* 189*  BUN 38* 37*  CREATININE 0.84 0.97  CALCIUM 8.4 8.3*   CBG (last 3)  No results found for this basename: GLUCAP,  in the last 72 hours  Wt Readings from Last 3 Encounters:  12/01/13 69.4 kg (153 lb)  11/14/13 73.755 kg (162 lb 9.6 oz)  11/14/13 73.755 kg (162 lb 9.6 oz)    Physical Exam:  Constitutional: She is oriented to person, place, and time. She appears well-developed and well-nourished.  HENT: dentition fair. Oral mucosa pink/moist  Head: Normocephalic and atraumatic.  Eyes: Conjunctivae are normal.  Cardiovascular: Normal rate and regular rhythm. No murmur  Respiratory: No respiratory distress. She has crackles L>R GI: Soft. Bowel sounds are normal. She exhibits no distension. There is no tenderness.  Musculoskeletal: She exhibits no edema.  No calf tenderness Neurological: She is alert and oriented to person, place, and time.  Paraparesis with sensory deficits. DTR absent BLE. LLE tr to 1 to 1+/5 HF,KE. Tr at ankle. RLE is 1+hf, 2-ke, 3 ankle. Sensation 1/2 LLE 1+ RLE. T8 Sensory level. Tone LE minimal to none Skin: Skin is warm and dry.  Psychiatric: She has a normal mood and affect. Her behavior is normal.    Assessment/Plan: 1. 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 Physiatrist is providing close team supervision and 24 hour management of active medical problems listed below. Physiatrist and rehab team continue to assess barriers to discharge/monitor patient progress toward functional and medical goals.  Discussed prognosis and plan with patient today  FIM: FIM - Bathing Bathing Steps Patient Completed: Chest;Right Arm;Left Arm;Abdomen;Front perineal area;Buttocks;Right upper leg;Left upper leg Bathing: 4: Min-Patient completes 8-9 63f 10 parts or 75+ percent (suupine in bed, HOB elevated)  FIM - Upper Body Dressing/Undressing Upper body dressing/undressing steps patient completed: Thread/unthread right sleeve of pullover shirt/dresss;Thread/unthread left sleeve of pullover shirt/dress;Put head through opening of pull over shirt/dress;Pull shirt over trunk Upper body dressing/undressing: 0: Wears gown/pajamas-no public clothing FIM - Lower Body Dressing/Undressing Lower body dressing/undressing steps patient completed: Thread/unthread right underwear leg;Thread/unthread right pants leg Lower body dressing/undressing: 1: Total-Patient completed less than 25% of tasks  FIM - Musician Devices: Grab bar or rail for support Toileting: 1: Two helpers  FIM - Radio producer Devices: Recruitment consultant Transfers: 2-To toilet/BSC: Max A (lift and lower assist);2-From toilet/BSC: Max A (lift and lower assist)  FIM - Control and instrumentation engineer Devices: Sliding board;Arm rests;Bed rails;HOB elevated Bed/Chair Transfer: 2: Bed > Chair or W/C: Max A (lift and lower assist);2: Chair or W/C > Bed: Max A (lift and lower assist);3: Sit > Supine: Mod A (lifting assist/Pt. 50-74%/lift 2 legs);3: Supine >  Sit: Mod A (lifting assist/Pt. 50-74%/lift 2 legs  FIM - Locomotion:  Wheelchair Distance: 150 Locomotion: Wheelchair: 2: Travels 50 - 149 ft with supervision, cueing or coaxing FIM - Locomotion: Ambulation Locomotion: Ambulation Assistive Devices:  (unable to perform safely) Locomotion: Ambulation: 0: Activity did not occur  Comprehension Comprehension Mode: Auditory Comprehension: 5-Understands complex 90% of the time/Cues < 10% of the time  Expression Expression Mode: Verbal Expression: 5-Expresses complex 90% of the time/cues < 10% of the time  Social Interaction Social Interaction: 6-Interacts appropriately with others with medication or extra time (anti-anxiety, antidepressant).  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 DVT Prophylaxis/Anticoagulation: Mechanical: Antiembolism stockings, knee (TED hose) Bilateral lower extremities  Sequential compression devices, below knee Bilateral lower extremities  -dopplers negative.  2. Chronic pain due to fibromyalgia as well as back pain with neuropathy/ Pain Management: reduce oxycodone 5 mg qid  as well Lyrica 100 mg tid and cymbalta bid    -follow for activity tolerance 3. Depression with anxiety and panic attacks/Mood: Team to provide ego support. LCSW to follow for evaluation and support. Will continue cymbalta bid, xanax 0.5 mg tid as well as trazodone at bedtime for mood and insomnia.  4.Neuropsych: This patient is capable of making decisions on her own behalf.  5. Severe COPD: Continue oxygen 4 liters--used at home. Continue spiriva and dulera.    -albuterol changed to xopenex neb--  - scheduled mucinex  .   -sputum culture nl.   -continue IS, FV,    -tobacco abstinence 6.Neurogenic Bowel and bladder :   On linzess but  ineffective-- ileus resolving, advanced diet as tolerated  -reduced narcotics,     -Good results with SMOG   -senna    7. Chronic renal failure: Baseline BUN/Cr- 39/1.80 at admission. Near baseline---continue current meds  -BUN/CR close to baseline  8. Diastolic dysfunction with tachycardia (as compensatory for hypoxia)   -lasix resumed at 40mg  daily 9. Surgical- decadron taper   10. Bladder: I and O cath prn/ voiding trial---may need to go home with a foley  -continue low dose flomax  -pseudomonas in urine--macrobid--follow up urine with multispecies---dc abx   LOS (Days) 15 A FACE TO FACE EVALUATION WAS PERFORMED  Meredith Staggers 12/01/2013 8:15 AM

## 2013-12-01 NOTE — Consult Note (Signed)
NEUROCOGNITIVE STATUS EXAMINATION - [facility]   [name] is a 53 year old woman, with history of severe COPD (oxygen dependent), FMS, multiple episodes of pneumonia this winter, chronic thoracic pain with compression fracture who was seen for a neurocognitive status examination to evaluate her emotional state and mental status.  According to her medical record, she was admitted on 11/11/13 with complaints of urinary retention and progressive bilateral lower extremity weakness with inability to walk for a week.  MRI T-spine revealed "abnormal structures within the spinal canal at the T8 level, larger on the left than on the right, which appear to compress the spinal cord."  She underwent mass resection on 11/12/13 and was extubated on 11/13/13.    Emotional Functioning:  During the clinical interview, [name] acknowledged feeling "nervous and stressed," mostly related to her loss of independence and bladder issues, but she said that she is generally optimistic about her recovery.  [name] noted that she has a history of major depressive disorder and while she stated that she has had moments of low mood related to her current situation, she denied symptoms of major depression, including suicidal ideation.  She said that she felt her antidepressant was working well to help her cope with her current situation.  Time was spent discussing [name]'s methods for coping with stress, frustration, and low mood and she mentioned that she uses spirituality to cope, but that she has used smoking cigarettes to cope with intense emotions in the past.  Now, since she is unable to smoke, she said that she has been working crossword puzzles and watching television in an attempt to relax her mind.  Despite her anxious and depressed mood, [name] stated that she maintains significant motivation to participate in therapies while on the unit.  She acknowledged  frustration at times when she feels as though she is being asked to practice skills that she feels has mastered.  [name]'s responses to a self-report measure of depressive symptoms were not suggestive of the presence of clinically significant depression at this time.  While a formal measure of anxiety symptoms was not administered, she disclosed experiencing significant nervousness, occasional intermittent tremors in her arms and hands, and general "stress," which could represent an anxiety disorder, even if it is secondary to tobacco withdrawal.     Mental Status:  [name] total score on an overall measure of mental status was not suggestive of the presence of significant cognitive impairment at this time (MMSE-2 = 14/16).  She lost two points for failing to freely recall 2 of 3 words after a brief delay.  Subjectively, she denied noticing changes in cognitive functioning.    Impressions and Recommendations:  [name] overall neurocognitive profile was not suggestive of the presence of marked cognitive impairment at this time.  She seemed to be able to discuss her physical limitations and accommodations that she would need to make after discharge.  Based on her description of what will happen in the future, she seems to have realistic expectations for recovery.  From an emotional standpoint, she seems to be experiencing anxiety at a clinical level.  Given her reported history, her anxious mood could be related to withdrawal from tobacco use.  As such, she may respond best to behavioral interventions (e.g. deep breathing, etc.).  Time was spent during the session teaching deep breathing techniques and [name] reported noticing significant improvement in her level of anxiety after practicing deep breathing for only  3-4 minutes.  She was encouraged to practice deep breathing on her own multiple times a day.  If she is particularly stressed or anxious during any of her therapy sessions, care  team members may find it beneficial to remind her to take a few deep breaths.  [name] level of depression should also continue to be monitored.  At this time, she does not seem to be experiencing major depression.  However, we discussed the risks for increased depression after she is discharged and she was agreeable to telling her physician should she notice an increase in depressed mood.    DIAGNOSIS: Thoracic Spine Tumor  Marlane Hatcher, Psy.D.  Clinical Neuropsychologist

## 2013-12-01 NOTE — Progress Notes (Signed)
Occupational Therapy Session Note  Patient Details  Name: [name] MRN: [id number] Date of Birth: 17-Aug-1961  Today's Date: 12/01/2013 Time: 0730-0830 Time Calculation (min): 60 min  Short Term Goals: Week 3:  OT Short Term Goal 1 (Week 3): STG=LTG due to anticipated discharge to home within 7 days  Skilled Therapeutic Interventions/Progress Updates: ADL-retraining with focus on energy conservation, bed mobility, slide board transfer and adapted bathing/dressing skills.   Patient received supine in bed, awaiting breakfast and reporting desire to prepare meal as discussed by recreation therapist. (RT)   With RT present, OT clarified scheduling priorities and advised patient on plan for therapeutic kitchen task during afternoon appointment.  Patient acknowledged plan, finished self-feeding and proceeded through ADL task (bathing and dressing, supine and at w/c level).   With setup assist patient completed upper body bathing, followed by lowered body bathing with min assist to complete rolls due to weak hip/knee flexion during trunk rotation.   Patient washed peri-area and buttocks after setup but did not attempt use of LH sponge despite numerous cues.   Patient has consistently disregarded use of AE (leg lifter, reacher, LH sponge) and will not incorporate devices into self-care.   After min assist to wash feet, OT laced patient's underwear and pants to knee level however patient could not perform bed rolls to pull up clothing over hips. Patient required max-total assist for lower body dressing but only min assist to rise to sitting at edge of bed in prep for slide board transfer to w/c.   Patient required only min assist to complete transfer but continues to require mod vc to attend to LE and to reach for armrest of w/c during transfer.   Patient requested grooming at sink side although no treatment time remained; pt left at sink to complete hair care and oral care unassisted with call light within  reach.     Therapy Documentation Precautions:  Precautions Precautions: Back;Fall Precaution Booklet Issued: No Precaution Comments: Back precautions reviewed (BAT) Restrictions Weight Bearing Restrictions: No  Vital Signs: Therapy Vitals Temp: 97 F (36.1 C) Temp src: Oral Pulse Rate: 89 Resp: 18 BP: 131/83 mmHg Patient Position, if appropriate: Lying Oxygen Therapy SpO2: 98 % O2 Device: Nasal cannula O2 Flow Rate (L/min): 4 L/min  Pain: Pain Assessment Pain Assessment: No/denies pain  ADL: ADL ADL Comments: see FIM  See FIM for current functional status  Therapy/Group: Individual Therapy  Second session: Time: 1415-1500 Time Calculation (min):  45 min  Pain Assessment: No/denies pain  Skilled Therapeutic Interventions:  See FIM for current functional status  Therapy/Group: Individual Therapy  Salome Spotted 12/01/2013, 8:31 AM

## 2013-12-02 ENCOUNTER — Inpatient Hospital Stay (HOSPITAL_COMMUNITY): Payer: Medicare Other

## 2013-12-02 ENCOUNTER — Inpatient Hospital Stay (HOSPITAL_COMMUNITY): Payer: Medicare Other | Admitting: Physical Therapy

## 2013-12-02 ENCOUNTER — Inpatient Hospital Stay (HOSPITAL_COMMUNITY): Payer: Medicare Other | Admitting: Occupational Therapy

## 2013-12-02 DIAGNOSIS — D497 Neoplasm of unspecified behavior of endocrine glands and other parts of nervous system: Secondary | ICD-10-CM

## 2013-12-02 DIAGNOSIS — F341 Dysthymic disorder: Secondary | ICD-10-CM

## 2013-12-02 DIAGNOSIS — J449 Chronic obstructive pulmonary disease, unspecified: Secondary | ICD-10-CM

## 2013-12-02 DIAGNOSIS — IMO0001 Reserved for inherently not codable concepts without codable children: Secondary | ICD-10-CM

## 2013-12-02 DIAGNOSIS — K560 Paralytic ileus: Secondary | ICD-10-CM

## 2013-12-02 DIAGNOSIS — N189 Chronic kidney disease, unspecified: Secondary | ICD-10-CM

## 2013-12-02 NOTE — Progress Notes (Signed)
[facility] PHYSICAL MEDICINE & REHABILITATION     PROGRESS NOTE    Subjective/Complaints: Still retaining urine. Patient not wanting to get up to void at times.  A 12 point review of systems has been performed and if not noted above is otherwise negative.   Objective: Vital Signs: Blood pressure 121/76, pulse 106, temperature 98.5 F (36.9 C), temperature source Oral, resp. rate 20, height 5\' 2"  (1.575 m), weight 69.3 kg (152 lb 12.5 oz), SpO2 98.00%. No results found. No results found for this basename: WBC, HGB, HCT, PLT,  in the last 72 hours  Recent Labs  11/30/13 0625  NA 143  K 4.6  CL 105  GLUCOSE 189*  BUN 37*  CREATININE 0.97  CALCIUM 8.3*   CBG (last 3)  No results found for this basename: GLUCAP,  in the last 72 hours  Wt Readings from Last 3 Encounters:  12/02/13 69.3 kg (152 lb 12.5 oz)  11/14/13 73.755 kg (162 lb 9.6 oz)  11/14/13 73.755 kg (162 lb 9.6 oz)    Physical Exam:  Constitutional: She is oriented to person, place, and time. She appears well-developed and well-nourished.  HENT: dentition fair. Oral mucosa pink/moist  Head: Normocephalic and atraumatic.  Eyes: Conjunctivae are normal.  Cardiovascular: Normal rate and regular rhythm. No murmur  Respiratory: No respiratory distress. She has crackles L>R GI: Soft. Bowel sounds are normal. She exhibits no distension. There is no tenderness.  Musculoskeletal: She exhibits no edema.  No calf tenderness Neurological: She is alert and oriented to person, place, and time.  Paraparesis with sensory deficits. DTR absent BLE. LLE tr to 1 to 1+/5 HF,KE. Tr at ankle. RLE is 1+hf, 2-ke, 3 ankle. Sensation 1/2 LLE 1+ RLE. T8 Sensory level. Tone LE minimal to none Skin: Skin is warm and dry.  Psychiatric: She has a normal mood and affect. Her behavior is normal.    Assessment/Plan: 1. Functional deficits secondary to T8 spinal tumor s/p resection with paraplegia/myelopathy which require 3+ hours per day of  interdisciplinary therapy in a comprehensive inpatient rehab setting. Physiatrist is providing close team supervision and 24 hour management of active medical problems listed below. Physiatrist and rehab team continue to assess barriers to discharge/monitor patient progress toward functional and medical goals.     FIM: FIM - Bathing Bathing Steps Patient Completed: Chest;Right Arm;Abdomen;Left Arm;Front perineal area;Buttocks;Right upper leg;Left upper leg Bathing: 4: Min-Patient completes 8-9 66f 10 parts or 75+ percent  FIM - Upper Body Dressing/Undressing Upper body dressing/undressing steps patient completed: Thread/unthread right sleeve of pullover shirt/dresss;Put head through opening of pull over shirt/dress;Thread/unthread left sleeve of pullover shirt/dress;Pull shirt over trunk Upper body dressing/undressing: 5: Set-up assist to: Obtain clothing/put away FIM - Lower Body Dressing/Undressing Lower body dressing/undressing steps patient completed: Thread/unthread right underwear leg;Thread/unthread right pants leg Lower body dressing/undressing: 1: Total-Patient completed less than 25% of tasks  FIM - Musician Devices: Grab bar or rail for support Toileting: 1: Two helpers  FIM - Radio producer Devices: Recruitment consultant Transfers: 2-To toilet/BSC: Max A (lift and lower assist);2-From toilet/BSC: Max A (lift and lower assist)  FIM - Engineer, site Assistive Devices: Bed rails;Sliding board;Arm rests Bed/Chair Transfer: 4: Bed > Chair or W/C: Min A (steadying Pt. > 75%);3: Chair or W/C > Bed: Mod A (lift or lower assist)  FIM - Locomotion: Wheelchair Distance: 150 Locomotion: Wheelchair: 5: Travels 150 ft or more: maneuvers on rugs and over door sills with supervision,  cueing or coaxing FIM - Locomotion: Ambulation Locomotion: Ambulation Assistive Devices:  (unable to perform safely) Locomotion:  Ambulation: 0: Activity did not occur  Comprehension Comprehension Mode: Auditory Comprehension: 5-Understands complex 90% of the time/Cues < 10% of the time  Expression Expression Mode: Verbal Expression: 5-Expresses complex 90% of the time/cues < 10% of the time  Social Interaction Social Interaction: 6-Interacts appropriately with others with medication or extra time (anti-anxiety, antidepressant).  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 DVT Prophylaxis/Anticoagulation: Mechanical: Antiembolism stockings, knee (TED hose) Bilateral lower extremities  Sequential compression devices, below knee Bilateral lower extremities  -dopplers negative.  2. Chronic pain due to fibromyalgia as well as back pain with neuropathy/ Pain Management: reduce oxycodone 5 mg qid  as well Lyrica 100 mg tid and cymbalta bid    -follow for activity tolerance 3. Depression with anxiety and panic attacks/Mood: Team to provide ego support. LCSW to follow for evaluation and support. Will continue cymbalta bid, xanax 0.5 mg tid as well as trazodone at bedtime for mood and insomnia.  4.Neuropsych: This patient is capable of making decisions on her own behalf.  5. Severe COPD: Continue oxygen 4 liters--used at home. Continue spiriva and dulera.    -albuterol changed to xopenex neb--  - scheduled mucinex  .   -sputum culture nl.   -continue IS, FV,    -tobacco abstinence 6.Neurogenic Bowel and bladder :   On linzess but ineffective-- ileus resolving, advanced diet as tolerated  -reduced narcotics,     -results with SMOG   -senna    7. Chronic renal failure: Baseline BUN/Cr- 39/1.80 at admission. Near baseline---continue current meds  -BUN/CR  close to baseline  8. Diastolic dysfunction with tachycardia (as compensatory for hypoxia)   -lasix resumed at 40mg  daily 9. Surgical- decadron taper   10. Bladder: I and O cath prn/ voiding trial---may need to go home with a foley  -continue low dose flomax  -off abx   LOS (Days) 16 A FACE TO FACE EVALUATION WAS PERFORMED  Meredith Staggers 12/02/2013 8:28 AM

## 2013-12-02 NOTE — Progress Notes (Signed)
Occupational Therapy Session Note  Patient Details  Name: [name] MRN: [id number] Date of Birth: 14-May-1961  Today's Date: 12/02/2013 Time: 0730-0829 Time Calculation (min): 59 min  Short Term Goals: Week 3:  OT Short Term Goal 1 (Week 3): STG=LTG due to anticipated discharge to home within 7 days  Skilled Therapeutic Interventions/Progress Updates: ADL-retraining with focus on bed mobility, endurance, toileting, adapted bathing.   Patient received supine, awaiting ADL session.   Patient completed upper body bathing supine, initiated lower body bathing but experienced diarrhea and required assist with toileting after washing peri-area and buttocks.   Patient re-educate on benefit of leg lifter to flex knee/hips during trunk rotation component of bed mobility but she fails to use AE as recommended despite reinforcement provided.   Patient required total assist for lower body dressing but demo'd improved slide board transfer skills and accepts use of SB without complaint.   Patient left in w/c, setup for self-feeding with phone/call light placed within reach.        Therapy Documentation Precautions:  Precautions Precautions: Back;Fall Precaution Booklet Issued: No Precaution Comments: Back precautions reviewed (BAT) Restrictions Weight Bearing Restrictions: No  Vital Signs: Therapy Vitals Temp: 98.5 F (36.9 C) Temp src: Oral Pulse Rate: 106 Resp: 20 BP: 121/76 mmHg Patient Position, if appropriate: Lying Oxygen Therapy SpO2: 98 % O2 Device: Nasal cannula O2 Flow Rate (L/min): 4 L/min  Pain: Pain Assessment Pain Assessment: 0-10 Pain Score: 1  Pain Type: Surgical pain Pain Location: Back Pain Orientation: Mid;Lower Pain Descriptors / Indicators: Aching;Constant Pain Onset: On-going Patients Stated Pain Goal: 4 Pain Intervention(s): Medication (See eMAR);Repositioned  ADL: ADL ADL Comments: see FIM  See FIM for current functional status  Therapy/Group:  Individual Therapy    Salome Spotted 12/02/2013, 8:29 AM

## 2013-12-02 NOTE — Progress Notes (Signed)
Occupational Therapy Session Note  Patient Details  Name: [name] MRN: [id number] Date of Birth: [birth year]  Today's Date: 12/02/2013 Time: 2637-8588 Time Calculation (min): 45 min  Short Term Goals: Week 3:  OT Short Term Goal 1 (Week 3): STG=LTG due to anticipated discharge to home within 7 days  Skilled Therapeutic Interventions/Progress Updates:  Patient resting in w/c upon arrival.  Patient declined toilet transfer training or more practice in therapy kitchen stating that she felt like her stomach was so full and uncomfortable from lunch and probably because she need to have I/O cath.  Patient agreed to perform w/c>bed slide board transfer, UB theraband exercises and rolling to remove pants.  Patient able to position w/c with vcs, required assist to place the board and for technique regarding head to hip relationship.  Patient required mod assist secondary to going uphill and mod assist to get back in bed.  Patient required max cues to perform pursed lip breathing secondary to she states that she has always been a mouth breather.  Patient able to catch her breath and breathe easier after periods of pursed lip breathing.  Patient performed 1 set of 10 of 5 Theraband exercises with HOB up and rest breaks then she rolled to remove her pants for NT to perform baldder scan and I&O cath.  Therapy Documentation Precautions:  Precautions Precautions: Back;Fall Precaution Booklet Issued: No Precaution Comments: Back precautions reviewed (BAT) Restrictions Weight Bearing Restrictions: No Pain: Pain Assessment Pain Assessment: No/denies pain  Therapy/Group: Individual Therapy  Gaye Pollack 12/02/2013, 4:09 PM

## 2013-12-02 NOTE — Progress Notes (Signed)
Physical Therapy Session Note  Patient Details  Name: [name] MRN: [id number] Date of Birth: 11-24-1960  Today's Date: 12/02/2013 Time: 1100-1200 Time Calculation (min): 60 min  Skilled Therapeutic Interventions/Progress Updates:    Pain: no  C/o during session Session focused on functional endurance, strengthening and transfers. W/c propulsion for endurance x 150', 50', 60', 100' with supervision. Sliding board transfer w/c<>car performed with mod/max assist and cues for sequencing, assist for trunk balance, LE management, board placement, w/c positioning, and assist for progression across board. Standing frame focusing on bil LE stretching and weightbearing. Pt tolerated two bouts, able to play "Connect Four" game with one UE at a time demonstrating improving trunk control and activity tolerance.    Second Session Time:  1430-1500 Time Calculation (min): 30 min Skilled Therapeutic Interventions/Progress Updates:  Pt reported 6/10 stomach pain. Reported holding in bowel movements for fear of diarrhea. Pt declined using bedpan or bedside commode. PT encouraged pt to continue to have BMs to avoid constipation, pt verbalized understanding. Pt declined OOB mobility, preferred LE there-ex. Session focused on bil LE stretching and strengthening. PT passively stretched bil heel cords, knee flexion/extension, hip flexion/extension, hip internal/external rotation, hip ab/adduction, hip circular motion throughout socket joint. Pt then engaged in [facility] hip/knee flexion, resisted hip/knee extension, isometric hip adduction with knees/hips flexed against pillow, hip abduction against orange resistance band. Extended rest between each bout of exercise.   Therapy Documentation Precautions:  Precautions Precautions: Back;Fall Precaution Booklet Issued: No Precaution Comments: Back precautions reviewed (BAT) Restrictions Weight Bearing Restrictions: No  Pt maintains SpO2>90% on 4L [place] O2  See FIM  for current functional status  Therapy/Group: Individual Therapy both sessions  REVE CROCKET 12/02/2013, 3:33 PM

## 2013-12-03 ENCOUNTER — Ambulatory Visit (HOSPITAL_COMMUNITY): Payer: Medicare Other | Admitting: Physical Therapy

## 2013-12-03 DIAGNOSIS — J9620 Acute and chronic respiratory failure, unspecified whether with hypoxia or hypercapnia: Secondary | ICD-10-CM | POA: Diagnosis not present

## 2013-12-03 NOTE — Progress Notes (Signed)
Physical Therapy Session Note  Patient Details  Name: [name] MRN: [id number] Date of Birth: 06-01-1961  Today's Date: 12/03/2013 Time: 1350-1500 Time Calculation (min): 70 min  Skilled Therapeutic Interventions/Progress Updates:    Session focused on sliding board transfers (SBT) to actual car pt will be using at D/C. Husband and son present. PT demonstrated SBT w/c<>car with pt (mod assist) educating family on caregiver positioning, w/c positioning and parts management, safety during transfers, management of O2, w/c set-up and break down. Son then practiced with pt, min cues from therapist - no safety issues noted. Husband then practiced, w/c > car husband performed without his O2 and was SOB therefore PT recommended he wear his O2 during the transfer. Car>w/c husband did with O2 and much better. Husband's transfer was not as smooth as son's however also did not have any safety issues. Both demonstrated good understanding and carryover of PT instruction. Discussed when to call EMS vs. Fire dept if fall occurs.   Both, or at least husband would benefit from practicing SBT w/c to elevated bed. Husband reports ramp is built.   Therapy Documentation Precautions:  Precautions Precautions: Back;Fall Precaution Booklet Issued: No Precaution Comments: Back precautions reviewed (BAT) Restrictions Weight Bearing Restrictions: No  See FIM for current functional status  Therapy/Group: Individual Therapy  [name] 12/03/2013, 3:03 PM

## 2013-12-03 NOTE — Progress Notes (Signed)
[name] is a 53 y.o. female 07-Apr-1961 [id number]  Subjective: No new complaints. Still "can't urinate" - No new problems. Slept well. Feeling OK.  Objective: Vital signs in last 24 hours: Temp:  [98.2 F (36.8 C)-98.3 F (36.8 C)] 98.2 F (36.8 C) (04/11 0500) Pulse Rate:  [85-100] 85 (04/11 0500) Resp:  [18-20] 18 (04/11 0500) BP: (107-146)/(69-89) 146/89 mmHg (04/11 0500) SpO2:  [93 %-96 %] 96 % (04/11 0500) Weight:  [69 kg (152 lb 1.9 oz)] 69 kg (152 lb 1.9 oz) (04/11 0500) Weight change: -0.3 kg (-10.6 oz) Last BM Date: 12/01/13  Intake/Output from previous day: 04/10 0701 - 04/11 0700 In: 640 [P.O.:640] Out: 1725 [Urine:1725]  Physical Exam General: Obese, in bed. No apparent distress    Lungs: Normal effort. Lungs clear to auscultation, no crackles or wheezes. Cardiovascular: Regular rate and rhythm, no edema Neurological: No new neurological deficits Wounds:  Clean, dry, intact. No signs of infection.  Lab Results: BMET    Component Value Date/Time   NA 143 11/30/2013 0625   K 4.6 11/30/2013 0625   CL 105 11/30/2013 0625   CO2 24 11/30/2013 0625   GLUCOSE 189* 11/30/2013 0625   BUN 37* 11/30/2013 0625   CREATININE 0.97 11/30/2013 0625   CALCIUM 8.3* 11/30/2013 0625   GFRNONAA 66* 11/30/2013 0625   GFRAA 77* 11/30/2013 0625   CBC    Component Value Date/Time   WBC 7.1 11/17/2013 0600   RBC 3.69* 11/17/2013 0600   HGB 12.2 11/17/2013 0600   HCT 37.2 11/17/2013 0600   PLT 202 11/17/2013 0600   MCV 100.8* 11/17/2013 0600   MCH 33.1 11/17/2013 0600   MCHC 32.8 11/17/2013 0600   RDW 15.6* 11/17/2013 0600   LYMPHSABS 0.6* 11/17/2013 0600   MONOABS 0.4 11/17/2013 0600   EOSABS 0.0 11/17/2013 0600   BASOSABS 0.0 11/17/2013 0600   CBG's (last 3):  No results found for this basename: GLUCAP,  in the last 72 hours LFT's Lab Results  Component Value Date   ALT 28 11/17/2013   AST 11 11/17/2013   ALKPHOS 101 11/17/2013   BILITOT 0.2* 11/17/2013    Studies/Results: No results  found.  Medications:  I have reviewed the patient's current medications. Scheduled Medications: . acyclovir  800 mg Oral BID  . ALPRAZolam  0.5 mg Oral TID  . antiseptic oral rinse  15 mL Mouth Rinse QID  . calcium-vitamin D  1 tablet Oral BID  . chlorhexidine  15 mL Mouth Rinse BID  . dexamethasone  4 mg Oral Q12H  . dextromethorphan-guaiFENesin  1 tablet Oral BID  . diltiazem  300 mg Oral Daily  . DULoxetine  60 mg Oral BID  . ferrous sulfate  325 mg Oral Daily  . furosemide  40 mg Oral Daily  . hydrocortisone   Rectal TID  . levalbuterol  0.63 mg Nebulization TID  . levothyroxine  150 mcg Oral QAC breakfast  . Linaclotide  145 mcg Oral Daily  . loratadine  10 mg Oral QHS  . mometasone-formoterol  2 puff Inhalation BID  . nicotine  21 mg Transdermal QHS  . pantoprazole  40 mg Oral BID  . polyethylene glycol  17 g Oral TID  . potassium chloride  20 mEq Oral Daily  . pregabalin  100 mg Oral TID  . simethicone  80 mg Oral QID  . sodium phosphate  1 enema Rectal Q0600  . tamsulosin  0.4 mg Oral QPC supper  . tiotropium  18 mcg Inhalation Daily  . 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 DVT Prophylaxis/Anticoagulation: Mechanical: Antiembolism stockings, knee (TED hose) Bilateral lower extremities  Sequential compression devices, below knee Bilateral lower extremities  -dopplers negative.  2. Chronic pain due to fibromyalgia as well as back pain with neuropathy/ Pain Management: reduce oxycodone 5 mg qid -continue Lyrica 100 mg tid and cymbalta  bid  -follow for activity tolerance  3. Depression with anxiety and panic attacks/Mood: Team to provide ego support. LCSW to follow for evaluation and support. Will continue cymbalta bid, xanax 0.5 mg tid as well as trazodone at bedtime for mood and insomnia.  4.Neuropsych: This patient is capable of making decisions on her own behalf.  5. Severe COPD: Continue oxygen 4 liters--used at home. Continue spiriva and dulera.  -albuterol changed to xopenex neb--  - scheduled mucinex .  -sputum culture nl.  -continue IS, FV,  -tobacco abstinence  6.Neurogenic Bowel and bladder : On linzess but ineffective-- ileus resolving, advanced diet as tolerated  -reduced narcotics,  -results with SMOG  -senna  7. Chronic renal failure: Baseline BUN/Cr- 39/1.80 at admission. Near baseline---continue current meds  -BUN/CR close to baseline  8. Diastolic dysfunction with tachycardia   -lasix  40mg  daily  9. Surgical- decadron taper  10. Bladder: I and O cath prn/ voiding trial---may need to go home with a foley  -continue low dose flomax  -off abx   Length of stay, days: 17    Aboubacar Matsuo A. Asa Lente, MD 12/03/2013, 8:31 AM

## 2013-12-04 ENCOUNTER — Inpatient Hospital Stay (HOSPITAL_COMMUNITY): Payer: Medicare Other | Admitting: *Deleted

## 2013-12-04 DIAGNOSIS — J9620 Acute and chronic respiratory failure, unspecified whether with hypoxia or hypercapnia: Secondary | ICD-10-CM | POA: Diagnosis not present

## 2013-12-04 NOTE — Progress Notes (Signed)
[name] is a 53 y.o. female [birth year] [id number]  Subjective: No new complaints. Still "can't urinate" - No new problems. No shortness of breath or cough. Slept well. Feeling OK. Anxious for DC home this week  Objective: Vital signs in last 24 hours: Temp:  [98.3 F (36.8 C)-98.4 F (36.9 C)] 98.3 F (36.8 C) (04/12 0500) Pulse Rate:  [90-100] 90 (04/12 0500) Resp:  [18] 18 (04/12 0500) BP: (121-148)/(76-98) 121/76 mmHg (04/12 0500) SpO2:  [96 %-97 %] 97 % (04/12 0830) FiO2 (%):  [36 %] 36 % (04/12 0830) Weight:  [70.2 kg (154 lb 12.2 oz)] 70.2 kg (154 lb 12.2 oz) (04/12 0500) Weight change: 1.2 kg (2 lb 10.3 oz) Last BM Date: 12/03/13  Intake/Output from previous day: 04/11 0701 - 04/12 0700 In: 1560 [P.O.:1560] Out: 1750 [Urine:1750]  Physical Exam General: Obese, in bed. No apparent distress    Lungs: Normal effort. Lungs clear to auscultation, no crackles or wheezes. Cardiovascular: Regular rate and rhythm, no edema Neurological: No new neurological deficits Wounds:  Clean, dry, intact. No signs of infection.  Lab Results: BMET    Component Value Date/Time   NA 143 11/30/2013 0625   K 4.6 11/30/2013 0625   CL 105 11/30/2013 0625   CO2 24 11/30/2013 0625   GLUCOSE 189* 11/30/2013 0625   BUN 37* 11/30/2013 0625   CREATININE 0.97 11/30/2013 0625   CALCIUM 8.3* 11/30/2013 0625   GFRNONAA 66* 11/30/2013 0625   GFRAA 77* 11/30/2013 0625   CBC    Component Value Date/Time   WBC 7.1 11/17/2013 0600   RBC 3.69* 11/17/2013 0600   HGB 12.2 11/17/2013 0600   HCT 37.2 11/17/2013 0600   PLT 202 11/17/2013 0600   MCV 100.8* 11/17/2013 0600   MCH 33.1 11/17/2013 0600   MCHC 32.8 11/17/2013 0600   RDW 15.6* 11/17/2013 0600   LYMPHSABS 0.6* 11/17/2013 0600   MONOABS 0.4 11/17/2013 0600   EOSABS 0.0 11/17/2013 0600   BASOSABS 0.0 11/17/2013 0600   CBG's (last 3):  No results found for this basename: GLUCAP,  in the last 72 hours LFT's Lab Results  Component Value Date   ALT 28 11/17/2013    AST 11 11/17/2013   ALKPHOS 101 11/17/2013   BILITOT 0.2* 11/17/2013    Studies/Results: No results found.  Medications:  I have reviewed the patient's current medications. Scheduled Medications: . acyclovir  800 mg Oral BID  . ALPRAZolam  0.5 mg Oral TID  . antiseptic oral rinse  15 mL Mouth Rinse QID  . calcium-vitamin D  1 tablet Oral BID  . chlorhexidine  15 mL Mouth Rinse BID  . dexamethasone  4 mg Oral Q12H  . dextromethorphan-guaiFENesin  1 tablet Oral BID  . diltiazem  300 mg Oral Daily  . DULoxetine  60 mg Oral BID  . ferrous sulfate  325 mg Oral Daily  . furosemide  40 mg Oral Daily  . hydrocortisone   Rectal TID  . levalbuterol  0.63 mg Nebulization TID  . levothyroxine  150 mcg Oral QAC breakfast  . Linaclotide  145 mcg Oral Daily  . loratadine  10 mg Oral QHS  . mometasone-formoterol  2 puff Inhalation BID  . nicotine  21 mg Transdermal QHS  . pantoprazole  40 mg Oral BID  . polyethylene glycol  17 g Oral TID  . potassium chloride  20 mEq Oral Daily  . pregabalin  100 mg Oral TID  . simethicone  80 mg Oral  QID  . sodium phosphate  1 enema Rectal Q0600  . tamsulosin  0.4 mg Oral QPC supper  . tiotropium  18 mcg Inhalation Daily  . 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 DVT Prophylaxis/Anticoagulation: Mechanical: Antiembolism stockings, knee (TED hose) Bilateral lower extremities  Sequential compression devices, below knee Bilateral lower extremities  -dopplers negative.  2. Chronic pain due to fibromyalgia as well as back  pain with neuropathy/ Pain Management: reduce oxycodone 5 mg qid -continue Lyrica 100 mg tid and cymbalta bid  -follow for activity tolerance  3. Depression with anxiety and panic attacks/Mood: Team to provide ego support. LCSW to follow for evaluation and support. Will continue cymbalta bid, xanax 0.5 mg tid as well as trazodone at bedtime for mood and insomnia.  4.Neuropsych: This patient is capable of making decisions on her own behalf.  5. Severe COPD: Continue oxygen 4 liters--used at home. Continue spiriva and dulera.  -albuterol changed to xopenex neb--  - scheduled mucinex .  -sputum culture nl.  -continue IS, FV,  -tobacco abstinence  6.Neurogenic Bowel and bladder : On linzess but ineffective-- ileus resolving, advanced diet as tolerated  -reduced narcotics,  -results with SMOG  -senna  7. Chronic renal failure: Baseline BUN/Cr- 39/1.80 at admission. Near baseline---continue current meds  -BUN/CR close to baseline  8. Diastolic dysfunction with tachycardia   -lasix  40mg  daily  9. Surgical- decadron taper  10. Bladder: I and O cath prn/ voiding trial--- preparing to go home with a foley and OP uro follow up  -continue low dose flomax  -off abx   Length of stay, days: 18    Tevis Conger A. Asa Lente, MD 12/04/2013, 9:31 AM

## 2013-12-04 NOTE — Progress Notes (Signed)
Occupational Therapy Note   Patient Details  Name: LAURITA PERON MRN: [id number] Date of Birth: 03-Jan-1961 Today's Date: 12/04/2013  Time:  1100-1215 (75 min) Pain: shoulders, back, legs and stomach due to constipation and unable to urinate Individual session  Engaged in therapeutic bathing and dressing at  supine level/  Addressed bed mobility, sitting balance, transfers, and pt directing care.  Pt rolled to right and left with  Mod assist for Lower extremities.  She performed UB bathing and dressing with set up.  See FIM for levels.  Went from side lying to sit with max assist.  Sat EOB and leaned to left for board placement with minimal assist for balance and placement.  Transferred to wc with max assist.  Max assist for scooting posteriorly.  Pt finished grooming at sink with no assistance.  Left in wc with call bell in reach and lunch.      Lisa Roca 12/04/2013, 11:38 AM

## 2013-12-05 ENCOUNTER — Inpatient Hospital Stay (HOSPITAL_COMMUNITY): Payer: Medicare Other

## 2013-12-05 ENCOUNTER — Inpatient Hospital Stay (HOSPITAL_COMMUNITY): Payer: Medicare Other | Admitting: Physical Therapy

## 2013-12-05 DIAGNOSIS — J189 Pneumonia, unspecified organism: Secondary | ICD-10-CM

## 2013-12-05 DIAGNOSIS — IMO0001 Reserved for inherently not codable concepts without codable children: Secondary | ICD-10-CM

## 2013-12-05 DIAGNOSIS — N189 Chronic kidney disease, unspecified: Secondary | ICD-10-CM

## 2013-12-05 DIAGNOSIS — F341 Dysthymic disorder: Secondary | ICD-10-CM

## 2013-12-05 DIAGNOSIS — K560 Paralytic ileus: Secondary | ICD-10-CM

## 2013-12-05 DIAGNOSIS — J449 Chronic obstructive pulmonary disease, unspecified: Secondary | ICD-10-CM

## 2013-12-05 DIAGNOSIS — J4489 Other specified chronic obstructive pulmonary disease: Secondary | ICD-10-CM

## 2013-12-05 DIAGNOSIS — D497 Neoplasm of unspecified behavior of endocrine glands and other parts of nervous system: Secondary | ICD-10-CM

## 2013-12-05 LAB — BASIC METABOLIC PANEL
BUN: 43 mg/dL — AB (ref 6–23)
CO2: 20 mEq/L (ref 19–32)
CREATININE: 0.78 mg/dL (ref 0.50–1.10)
Calcium: 8.9 mg/dL (ref 8.4–10.5)
Chloride: 99 mEq/L (ref 96–112)
GFR calc Af Amer: >90 mL/min (ref >90)
GFR calc non Af Amer: >90 mL/min (ref >90)
GLUCOSE: 403 mg/dL — AB (ref 70–99)
Potassium: 5.6 mEq/L — ABNORMAL HIGH (ref 3.7–5.3)
Sodium: 137 mEq/L (ref 137–147)

## 2013-12-05 MED ORDER — VANCOMYCIN HCL IN DEXTROSE 750-5 MG/150ML-% IV SOLN
750.0000 mg | Freq: Two times a day (BID) | INTRAVENOUS | Status: DC
  Administered 2013-12-05 – 2013-12-08 (×7): 750 mg via INTRAVENOUS
  Filled 2013-12-05 (×7): qty 150

## 2013-12-05 MED ORDER — PIPERACILLIN-TAZOBACTAM 3.375 G IVPB
3.3750 g | Freq: Three times a day (TID) | INTRAVENOUS | Status: DC
  Administered 2013-12-05 – 2013-12-08 (×9): 3.375 g via INTRAVENOUS
  Filled 2013-12-05 (×11): qty 50

## 2013-12-05 MED ORDER — SODIUM POLYSTYRENE SULFONATE 15 GM/60ML PO SUSP
30.0000 g | Freq: Once | ORAL | Status: AC
  Administered 2013-12-05: 30 g via ORAL
  Filled 2013-12-05: qty 120

## 2013-12-05 MED ORDER — IPRATROPIUM BROMIDE 0.02 % IN SOLN
0.5000 mg | Freq: Four times a day (QID) | RESPIRATORY_TRACT | Status: DC
  Administered 2013-12-05 – 2013-12-09 (×15): 0.5 mg via RESPIRATORY_TRACT
  Filled 2013-12-05 (×15): qty 2.5

## 2013-12-05 MED ORDER — LEVALBUTEROL HCL 0.63 MG/3ML IN NEBU
0.6300 mg | INHALATION_SOLUTION | Freq: Four times a day (QID) | RESPIRATORY_TRACT | Status: DC
  Administered 2013-12-05 – 2013-12-09 (×15): 0.63 mg via RESPIRATORY_TRACT
  Filled 2013-12-05 (×29): qty 3

## 2013-12-05 NOTE — Consult Note (Signed)
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  Now on CIR with worsening SOB and hypoxia.    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  Now on CIR with worsening SOB and hypoxia.  States she is more SOB than baseline, with occasional dry cough.  Feels as though she has not been back to baseline ever since first "bout of pna" in December.   Denies chest pain, hemoptysis, fevers, orthopnea.    PAST MEDICAL HISTORY :  Past Medical History  Diagnosis Date  . Right-sided heart failure   . COPD (chronic obstructive pulmonary disease)   . Emphysema   . Seizures   . Thyroid disease   . Depression   . Emphysema   . Arthritis     back and legs  . Blood transfusion without reported diagnosis   . High cholesterol   . Anxiety   . Nerves   . Panic attack   . Thyroid disorder   . Hypothyroidism   . Pneumonia   . CHF (congestive heart failure)   . On home O2     4L N/C chronic  . Fibromyalgia    Past Surgical History  Procedure Laterality Date  . Abdominal hysterectomy    . Cesarean section    . Cholecystectomy     . Laser removal condyloma    . Laminectomy N/A 11/12/2013    Procedure: THORACIC LAMINECTOMY FOR MASS;  Surgeon: Eustace Moore, MD;  Location: [facility];  Service: Neurosurgery;  Laterality: N/A;   Prior to Admission medications   Medication Sig Start Date End Date Taking? Authorizing Provider  acetaminophen (TYLENOL) 325 MG tablet Take 2 tablets (650 mg total) by mouth every 6 (six) hours as needed for mild pain (or Fever >/= 101). 11/16/13  Yes Kelvin Cellar, MD  acyclovir (ZOVIRAX) 800 MG tablet Take 800 mg by mouth 2 (two) times daily.   Yes Historical Provider, MD  acyclovir (ZOVIRAX) 800 MG tablet Take 1 tablet (800 mg total) by mouth 2 (two) times daily. 11/16/13  Yes Kelvin Cellar, MD  albuterol (PROVENTIL) (2.5 MG/3ML) 0.083% nebulizer solution Take 2.5 mg by nebulization 4 (four) times daily.   Yes Historical Provider, MD  albuterol (PROVENTIL) (2.5 MG/3ML) 0.083% nebulizer solution Take 3 mLs (2.5 mg total) by nebulization every 2 (two) hours as needed for wheezing or shortness of breath. 11/16/13  Yes Kelvin Cellar, MD  ALPRAZolam Duanne Moron) 0.5 MG tablet Take 0.5 mg by mouth 3 (three) times daily.    Yes Historical Provider, MD  Calcium Carb-Cholecalciferol (CALCIUM 600 + D PO) Take 1 tablet by mouth 2 (two) times daily.   Yes Historical  Provider, MD  Cyanocobalamin (VITAMIN B-12) 5000 MCG SUBL Place 5,000 mcg under the tongue daily.   Yes Historical Provider, MD  dexamethasone (DECADRON) 4 MG tablet Take 1 tablet (4 mg total) by mouth every 6 (six) hours. 11/16/13  Yes Kelvin Cellar, MD  diltiazem (TIAZAC) 300 MG 24 hr capsule Take 300 mg by mouth daily.   Yes Historical Provider, MD  DULoxetine (CYMBALTA) 60 MG capsule Take 60 mg by mouth 2 (two) times daily.   Yes Historical Provider, MD  ferrous sulfate 325 (65 FE) MG EC tablet Take 325 mg by mouth daily.   Yes Historical Provider, MD  Fluticasone-Salmeterol (ADVAIR) 250-50 MCG/DOSE AEPB Inhale 1 puff into the lungs every 12  (twelve) hours.   Yes Historical Provider, MD  furosemide (LASIX) 40 MG tablet Take 40 mg by mouth daily.   Yes Historical Provider, MD  levothyroxine (SYNTHROID, LEVOTHROID) 150 MCG tablet Take 150 mcg by mouth daily.   Yes Historical Provider, MD  Linaclotide Rolan Lipa) 145 MCG CAPS capsule Take 145 mcg by mouth daily.   Yes Historical Provider, MD  loratadine (CLARITIN) 10 MG tablet Take 10 mg by mouth at bedtime.   Yes Historical Provider, MD  nicotine (NICODERM CQ - DOSED IN MG/24 HOURS) 14 mg/24hr patch Place 1 patch (14 mg total) onto the skin at bedtime. 11/16/13  Yes Kelvin Cellar, MD  oxyCODONE (ROXICODONE) 15 MG immediate release tablet Take 1 tablet (15 mg total) by mouth every 6 (six) hours as needed for pain. 11/16/13  Yes Kelvin Cellar, MD  pantoprazole (PROTONIX) 40 MG tablet Take 1 tablet (40 mg total) by mouth 2 (two) times daily. 06/27/13  Yes Tiffany L Reed, DO  potassium chloride SA (K-DUR,KLOR-CON) 20 MEQ tablet Take 20 mEq by mouth daily.   Yes Historical Provider, MD  pregabalin (LYRICA) 100 MG capsule Take 100 mg by mouth 3 (three) times daily.   Yes Historical Provider, MD  tamsulosin (FLOMAX) 0.4 MG CAPS capsule Take 1 capsule (0.4 mg total) by mouth daily after supper. 11/16/13  Yes Kelvin Cellar, MD  tiotropium (SPIRIVA) 18 MCG inhalation capsule Place 18 mcg into inhaler and inhale daily.   Yes Historical Provider, MD  traZODone (DESYREL) 150 MG tablet Take 150 mg by mouth at bedtime.   Yes Historical Provider, MD   Allergies  Allergen Reactions  . Tape Itching and Rash    FAMILY HISTORY:  Family History  Problem Relation Age of Onset  . Cancer Father    SOCIAL HISTORY:  reports that she quit smoking about 4 months ago. She has never used smokeless tobacco. She reports that she does not drink alcohol or use illicit drugs.  REVIEW OF SYSTEMS:   As per HPI - All other systems reviewed and were neg.     VITAL SIGNS: Temp:  [97.8 F (36.6 C)-99 F (37.2  C)] 99 F (37.2 C) (04/13 0551) Pulse Rate:  [86-136] 125 (04/13 1037) Resp:  [18-20] 20 (04/13 1037) BP: (127-134)/(79-91) 134/80 mmHg (04/13 0743) SpO2:  [86 %-97 %] 92 % (04/13 1037) FiO2 (%):  [36 %] 36 % (04/12 1407) Weight:  [152 lb 12.5 oz (69.3 kg)] 152 lb 12.5 oz (69.3 kg) (04/13 0551) 4.5L  PHYSICAL EXAMINATION: General:  Chronically ill appearing female, appears much older than stated age, NAD in wheelchair  Neuro:  Awake, alert, appropriate, MAE, BLE weakness  HEENT:  Mm dry, no JVD Cardiovascular:  s1s2 rrr Lungs:  resps even, non labored on , prolonged expiration, diminished  bases L>R, mild exp wheeze  Abdomen:  Soft, +bs Musculoskeletal:  Warm and dry, scant BLE edema, TED hose    Recent Labs Lab 11/29/13 0735 11/30/13 0625  NA 141 143  K 4.0 4.6  CL 102 105  CO2 22 24  BUN 38* 37*  CREATININE 0.84 0.97  GLUCOSE 167* 189*   No results found for this basename: HGB, HCT, WBC, PLT,  in the last 168 hours Dg Chest 2 View  12/05/2013   CLINICAL DATA:  Cough with hypoxia  EXAM: CHEST  2 VIEW  COMPARISON:  November 21, 2013  FINDINGS: There is increased opacity in the left base compared to recent prior study. This finding is felt to represent progression of pneumonia. There is patchy atelectasis in the right base which is stable. Heart is prominent with normal pulmonary vascularity, stable. There appears to be some underlying emphysematous change. Compression fractures again noted in the thoracic spine.  IMPRESSION: Increase in left base infiltrate. Otherwise no change from recent prior study.   Electronically Signed   By: Lowella Grip M.D.   On: 12/05/2013 10:40    ASSESSMENT / PLAN:  Hypoxemia  COPD Home Oxygen Dependent - 4L baseline HCAP - recent HCAP with Burkholderia cepacia (levofloxacin sens)  REC -  Change BD to duonebs  abx for HCAP  On decadron per nsgy  Supplemental O2  Aggressive pulm hygiene - add flutter  Rpeat CT chest - previous CT in  January with multifocal bilat infiltrates, dense L basilar infiltrate partially clearing Swallow eval with recurrent PNA  Sputum culture if able to expectorate  Mucolytic  Keep dry as tol  F/u CXR    Thoracic epidural mass -  Pt/ot  Per nsgy, CIR     Marijean Heath, NP 12/05/2013  11:45 AM Pager: [phone] or [phone]  *Care during the described time interval was provided by me and/or other providers on the critical care team. I have reviewed this patient's available data, including medical history, events of note, physical examination and test results as part of my evaluation.  Attending:  I have seen and examined the patient with nurse practitioner/resident and agree with the note above.   Unfortunately today's CT chest clearly shows a new infiltrate on the left base much more prominent than on the January 2015 study.   Will treat for standard organisms with vanc/zosyn, but must be weary of the B. Cepacia from January as that organism can be associated with high mortality from pulmonary infections. Follow sputum culture result closely  Roselie Awkward, MD [place] Pager: [phone] Cell: [phone] If no response, call [phone]

## 2013-12-05 NOTE — Progress Notes (Signed)
Physical Therapy Note  Patient Details  Name: [name] MRN: [id number] Date of Birth: 1961/05/07 Today's Date: 12/05/2013  Pt missing 30 minutes of skilled physical therapy secondary to pt feeling ill, reporting difficulty breathing. Resting vitals reveal elevated HR (125 bpm) and SpO2 92% on 4.5 L/min supplemental O2, [name]. PT with labored breathing. RN notified of missed therapy and pt pain/symptoms. Per RN, awaiting results of chest x-ray.   Benjie Karvonen A [place] 12/05/2013, 10:49 AM

## 2013-12-05 NOTE — Progress Notes (Signed)
Social Work Patient ID: [name], female   DOB: 1961-01-15, 53 y.o.   MRN: [id number]  CSW met with pt and spoke to her husband via telephone to update them on team conference discussion.  Pt remains on target to d/c on 12-07-13.  Pt wants to do family education with husband and son on actual car being used for d/c home.  CSW arranged this.  Pt's husband is having a ramp built so that pt can use w/c to enter the home.  Pt will need home health for therapies and DME- drop arm commode, 30' transfer board, hospital bed, and w/c.  Pt uses [facility] for [facility] and [employer] for O2.  She is comfortable with using [facility] for DME.  CSW will continue to follow and assist pt as needed.  Husband is appreciative of CSW help.

## 2013-12-05 NOTE — Progress Notes (Signed)
Patient unable to void.  In and out cath performed for 400 ml clear, yellow urine.   Patient tolerated well.  Moderate amt white substance noted in vaginal area.

## 2013-12-05 NOTE — Progress Notes (Signed)
Respiratory therapy note-Patientvery short of breath at this time, HR-124, BBS expiratory wheezes and rhonchi/coarse. Sp02-85 on 4l/min. Increased to 5l/min [place]. RN notified.

## 2013-12-05 NOTE — Progress Notes (Signed)
Dr. Naaman Plummer on unit and notified of patient's present O2 Sat of 84% to 89%; contacted respiratory therapy, but she was unable to see patient; given prn Xopenex by this RN without results; patient without distress.  No new orders received.

## 2013-12-05 NOTE — Progress Notes (Signed)
Social Work Patient ID: [name], female   DOB: 1960/08/31, 53 y.o.   MRN: [id number]  Kelsey Mesi Terisa Belardo, LCSW Social Worker Signed  Patient Care Conference Service date: 12/05/2013 10:31 AM  Inpatient RehabilitationTeam Conference and Plan of Care Update Date: 11/29/2013   Time: 2:00 PM     Patient Name: [name]       Medical Record Number: [id number]   Date of Birth: 09/13/1960 Sex: Female         Room/Bed: 4W18C/4W18C-01 Payor Info: Payor: MEDICARE / Plan: MEDICARE PART A AND B / Product Type: *No Product type* /   Admitting Diagnosis: resection epidural mass   Admit Date/Time:  11/16/2013  4:05 PM Admission Comments: No comment available   Primary Diagnosis:  <principal problem not specified> Principal Problem: <principal problem not specified>    Patient Active Problem List     Diagnosis  Date Noted   .  Thoracic spine tumor  11/16/2013   .  Urinary retention  11/12/2013   .  Left leg weakness  11/12/2013   .  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  Dyspnea  10/18/2013   .  Right upper lobe pneumonia/infiltrate  09/20/2013   .  Lichen planus vulva  09/20/2013   .  Thoracic compression fracture  09/20/2013   .  COPD exacerbation  09/19/2013   .  Acute delirium  09/07/2013   .  Chronic diastolic congestive heart failure, NYHA class 1  09/07/2013   .  Hypernatremia  09/06/2013   .  Shingles  09/06/2013   .  History of depression, chronic antidepressants  09/06/2013   .  Physical deconditioning  09/06/2013   .  Hypercarbia, acute (resolved) on chronic  09/02/2013   .  Acute-on-chronic respiratory failure  09/02/2013   .  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  HYPERTRIGLYCERIDEMIA  09/16/2006   .  History of anxiety - chronic alprazolam  06/10/2006   .  TOBACCO ABUSE  06/10/2006   .  COPD without acute bronchospasm  06/10/2006   .  PULMONARY NODULE  06/10/2006   .  GERD, chronic PPI use  06/10/2006   .  LOW BACK  PAIN  06/10/2006   .  FIBROMYALGIA  06/10/2006   .  SLEEP APNEA  06/10/2006   .  CERVICAL CANCER, HX OF  06/10/2006   .  HYSTERECTOMY, HX OF  06/10/2006     Expected Discharge Date: Expected Discharge Date: 12/07/13  Team Members Present: Physician leading conference: Dr. Alger Simons Social Worker Present: Alfonse Alpers, LCSW Nurse Present: Heather Roberts, RN PT Present: Ladona Horns, PT OT Present: Chrys Racer, Olena Leatherwood, OT PPS Coordinator present : Daiva Nakayama, RN, CRRN;Becky Alwyn Ren, PT        Current Status/Progress  Goal  Weekly Team Focus   Medical     respiratory status better. voiding wtih residual, bowels almost on a schedule.  see prior  pain control, anxiety issues   Bowel/Bladder     Continent of bowel.  Requires in and out cath q 8 hours by staff. Scanned at [phone] for 394; cath for 400.  LBM: 11/28/13  To develop the urge to void  Timed toileting during the day   Swallow/Nutrition/ Hydration            ADL's     Sueprvision and setup for UB ADL seated, Min A LB bathing, Max A LB dressing,  Min-Mod A transfers and toileting  Overall Min A  Family education, slide board transfer training, dynamic sitting balance, AE/DME training, energy conservation   Mobility     mod assist level sliding board transfers, max "uphill" sliding board transfers.  mod assist uphill transfers (car), min assist level transfers  Strengthening, endurance, sliding board transfers, w/c mobility and parts management, family education.   Communication            Safety/Cognition/ Behavioral Observations           Pain     Oxy IR 5 mg, Robaxin 500 mg given at6 0533 for back pain.  Pain managed at 3 or less  Assess for and treat pain q shift.   Skin     Generalized bruises; white cluster of blister in perinum area/buttucks  No new skin breakdown  Assess skin for breakdown q shift    Rehab Goals Patient on target to meet rehab goals: Yes Rehab Goals Revised: Downgrade bed mobility  to supine in bed; d/c standing gait goal and sliding board transfers to mod for uphill txs. *See Care Plan and progress notes for long and short-term goals.    Barriers to Discharge:  awareness, severe COPD      Possible Resolutions to Barriers:    education, see prior      Discharge Planning/Teaching Needs:    Pt plans to go home with her husband after d/c from CIR.   Family has been involved in family education.    Team Discussion:    Pt was feeling anxious and wanted to go outside for "fresh air" and to smoke.  Team talked to her and encouraged her to find other ways to channel anxiety.  MD to increase pt's nicotine patch.  Some of pt's goals have been downgraded.  PT wants to practice actual car tx with pt's husband and son.   Revisions to Treatment Plan:    none    Continued Need for Acute Rehabilitation Level of Care: The patient requires daily medical management by a physician with specialized training in physical medicine and rehabilitation for the following conditions: Daily direction of a multidisciplinary physical rehabilitation program to ensure safe treatment while eliciting the highest outcome that is of practical value to the patient.: Yes Daily medical management of patient stability for increased activity during participation in an intensive rehabilitation regime.: Yes Daily analysis of laboratory values and/or radiology reports with any subsequent need for medication adjustment of medical intervention for : Neurological problems;Pulmonary problems;Post surgical problems  [place] 12/05/2013, 10:32 AM

## 2013-12-05 NOTE — Patient Care Conference (Signed)
Inpatient RehabilitationTeam Conference and Plan of Care Update Date: 11/29/2013   Time: 2:00 PM    Patient Name: [name]      Medical Record Number: [id number]  Date of Birth: Oct 19, 1960 Sex: Female         Room/Bed: 4W18C/4W18C-01 Payor Info: Payor: MEDICARE / Plan: MEDICARE PART A AND B / Product Type: *No Product type* /    Admitting Diagnosis: resection epidural mass  Admit Date/Time:  11/16/2013  4:05 PM Admission Comments: No comment available   Primary Diagnosis:  <principal problem not specified> Principal Problem: <principal problem not specified>  Patient Active Problem List   Diagnosis Date Noted  . Thoracic spine tumor 11/16/2013  . Urinary retention 11/12/2013  . Left leg weakness 11/12/2013  . 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 Dyspnea 10/18/2013  . Right upper lobe pneumonia/infiltrate 09/20/2013  . Lichen planus vulva 88/41/6606  . Thoracic compression fracture 09/20/2013  . COPD exacerbation 09/19/2013  . Acute delirium 09/07/2013  . Chronic diastolic congestive heart failure, NYHA class 1 09/07/2013  . Hypernatremia 09/06/2013  . Shingles 09/06/2013  . History of depression, chronic antidepressants 09/06/2013  . Physical deconditioning 09/06/2013  . Hypercarbia, acute (resolved) on chronic 09/02/2013  . Acute-on-chronic respiratory failure 09/02/2013  . 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 HYPERTRIGLYCERIDEMIA 09/16/2006  . History of anxiety - chronic alprazolam 06/10/2006  . TOBACCO ABUSE 06/10/2006  . COPD without acute bronchospasm 06/10/2006  . PULMONARY NODULE 06/10/2006  . GERD, chronic PPI use 06/10/2006  . LOW BACK PAIN 06/10/2006  . FIBROMYALGIA 06/10/2006  . SLEEP APNEA 06/10/2006  . CERVICAL CANCER, HX OF 06/10/2006  . HYSTERECTOMY, HX OF 06/10/2006    Expected Discharge Date: Expected Discharge Date: 12/07/13  Team Members Present: Physician leading conference: Dr. Alger Simons Social Worker Present: Alfonse Alpers, LCSW Nurse Present: Heather Roberts, RN PT Present: Ladona Horns, PT OT Present: Chrys Racer, Olena Leatherwood, OT PPS Coordinator present : Daiva Nakayama, RN, CRRN;Becky Alwyn Ren, PT     Current Status/Progress Goal Weekly Team Focus  Medical   respiratory status better. voiding wtih residual, bowels almost on a schedule.  see prior  pain control, anxiety issues   Bowel/Bladder   Continent of bowel.  Requires in and out cath q 8 hours by staff. Scanned at [phone] for 394; cath for 400.  LBM: 11/28/13  To develop the urge to void  Timed toileting during the day   Swallow/Nutrition/ Hydration             ADL's   Sueprvision and setup for UB ADL seated, Min A LB bathing, Max A LB dressing, Min-Mod A transfers and toileting  Overall Min A  Family education, slide board transfer training, dynamic sitting balance, AE/DME training, energy conservation   Mobility   mod assist level sliding board transfers, max "uphill" sliding board transfers.  mod assist uphill transfers (car), min assist level transfers  Strengthening, endurance, sliding board transfers, w/c mobility and parts management, family education.   Communication             Safety/Cognition/ Behavioral Observations            Pain   Oxy IR 5 mg, Robaxin 500 mg given at6 0533 for back pain.  Pain managed at 3 or less  Assess for and treat pain q shift.   Skin   Generalized bruises; white cluster of blister in perinum area/buttucks  No new skin breakdown  Assess  skin for breakdown q shift    Rehab Goals Patient on target to meet rehab goals: Yes Rehab Goals Revised: Downgrade bed mobility to supine in bed; d/c standing gait goal and sliding board transfers to mod for uphill txs. *See Care Plan and progress notes for long and short-term goals.  Barriers to Discharge: awareness, severe COPD    Possible Resolutions to Barriers:  education, see prior    Discharge Planning/Teaching  Needs:  Pt plans to go home with her husband after d/c from CIR.  Family has been involved in family education.   Team Discussion:  Pt was feeling anxious and wanted to go outside for "fresh air" and to smoke.  Team talked to her and encouraged her to find other ways to channel anxiety.  MD to increase pt's nicotine patch.  Some of pt's goals have been downgraded.  PT wants to practice actual car tx with pt's husband and son.  Revisions to Treatment Plan:  none   Continued Need for Acute Rehabilitation Level of Care: The patient requires daily medical management by a physician with specialized training in physical medicine and rehabilitation for the following conditions: Daily direction of a multidisciplinary physical rehabilitation program to ensure safe treatment while eliciting the highest outcome that is of practical value to the patient.: Yes Daily medical management of patient stability for increased activity during participation in an intensive rehabilitation regime.: Yes Daily analysis of laboratory values and/or radiology reports with any subsequent need for medication adjustment of medical intervention for : Neurological problems;Pulmonary problems;Post surgical problems  [place] 12/05/2013, 10:32 AM

## 2013-12-05 NOTE — Progress Notes (Signed)
Occupational Therapy Session Note  Patient Details  Name: [name] MRN: [id number] Date of Birth: February 13, 1961  Today's Date: 12/05/2013 Time: 0730-0828 Time Calculation (min): 58 min  Short Term Goals: Week 3:  OT Short Term Goal 1 (Week 3): STG=LTG due to anticipated discharge to home within 7 days  Skilled Therapeutic Interventions/Progress Updates: ADL-retraining with focus on improved endurance, bed mobility, adapted bathing/dressing skills, slide board transfer.   Patient received in bed with respiratory therapists present.   Patient was congested and fatigued but motivated to bathe/dress in bed with assistance and rest breaks as needed.   Patient completed lower body bathing/dressing with total assist while supine and upper body at sink with setup assist.   Patient reported improvement from change in position and completed self-feeding with setup assist.  HR and 02 sat monitored while she sat in w/c eating her breakfast:  HR 136, 02 90% @ 5L.   At end of session radiology service arrived to escort patient to x-ray.    Therapy Documentation Precautions:  Precautions Precautions: Back;Fall Precaution Booklet Issued: No Precaution Comments: Back precautions reviewed (BAT) Restrictions Weight Bearing Restrictions: No  Vital Signs: Therapy Vitals Pulse Rate: 136 Patient Position, if appropriate: Sitting Oxygen Therapy SpO2: 90 % O2 Device: Nasal cannula O2 Flow Rate (L/min): 5 L/min  Pain: Pain Assessment Pain Score: 0-No pain  ADL: ADL ADL Comments: see FIM  See FIM for current functional status  Therapy/Group: Individual Therapy  Second session: Time:1330 Time Calculation (min):  0 min Missed Time: 60 min, CT/RN care/Pt ill/unavailable  Pain Assessment: n/a  Skilled Therapeutic Interventions: Pt at CT and per RN in prep for continued RN care due to respiratory distress.   See FIM for current functional status  Therapy/Group: Individual Therapy  Salome Spotted 12/05/2013, 8:28 AM

## 2013-12-05 NOTE — Progress Notes (Signed)
ANTIBIOTIC CONSULT NOTE - INITIAL  Pharmacy Consult for Vancomycin and Zosyn Indication: pneumonia  Allergies  Allergen Reactions  . Tape Itching and Rash    Patient Measurements: Height: 5\' 2"  (157.5 cm) Weight: 152 lb 12.5 oz (69.3 kg) IBW/kg (Calculated) : 50.1  Vital Signs: Temp: 99 F (37.2 C) (04/13 0551) Temp src: Oral (04/13 0551) BP: 134/80 mmHg (04/13 0743) Pulse Rate: 125 (04/13 1037) Intake/Output from previous day: 04/12 0701 - 04/13 0700 In: 280 [P.O.:280] Out: 1750 [Urine:1750] Intake/Output from this shift: Total I/O In: 240 [P.O.:240] Out: -   Labs: No results found for this basename: WBC, HGB, PLT, LABCREA, CREATININE,  in the last 72 hours Estimated Creatinine Clearance: 62.6 ml/min (by C-G formula based on Cr of 0.97). No results found for this basename: Letta Median, VANCORANDOM, GENTTROUGH, GENTPEAK, GENTRANDOM, TOBRATROUGH, TOBRAPEAK, TOBRARND, AMIKACINPEAK, AMIKACINTROU, AMIKACIN,  in the last 72 hours   Microbiology: Recent Results (from the past 720 hour(s))  URINE CULTURE     Status: None   Collection Time    11/12/13  9:51 AM      Result Value Ref Range Status   Specimen Description URINE, CATHETERIZED   Final   Special Requests NONE   Final   Culture  Setup Time     Final   Value: 11/12/2013 17:23     Performed at [place]     Final   Value: NO GROWTH     Performed at [facility]   Culture     Final   Value: NO GROWTH     Performed at [facility]   Report Status 11/13/2013 FINAL   Final  MRSA PCR SCREENING     Status: None   Collection Time    11/12/13  6:40 PM      Result Value Ref Range Status   MRSA by PCR NEGATIVE  NEGATIVE Final   Comment:            The GeneXpert MRSA Assay (FDA     approved for NASAL specimens     only), is one component of a     comprehensive MRSA colonization     surveillance program. It is not     intended to diagnose MRSA     infection nor to  guide or     monitor treatment for     MRSA infections.  CULTURE, BLOOD (ROUTINE X 2)     Status: None   Collection Time    11/12/13  6:48 PM      Result Value Ref Range Status   Specimen Description BLOOD RIGHT ARM   Final   Special Requests BOTTLES DRAWN AEROBIC ONLY 5CC   Final   Culture  Setup Time     Final   Value: 11/13/2013 00:10     Performed at [facility]   Culture     Final   Value: NO GROWTH 5 DAYS     Performed at [facility]   Report Status 11/19/2013 FINAL   Final  CULTURE, BLOOD (ROUTINE X 2)     Status: None   Collection Time    11/12/13  7:00 PM      Result Value Ref Range Status   Specimen Description BLOOD RIGHT HAND   Final   Special Requests BOTTLES DRAWN AEROBIC ONLY [place]   Final   Culture  Setup Time     Final   Value: 11/13/2013 00:10  Performed at [facility]     Final   Value: NO GROWTH 5 DAYS     Performed at [facility]   Report Status 11/19/2013 FINAL   Final  CULTURE, EXPECTORATED SPUTUM-ASSESSMENT     Status: None   Collection Time    11/16/13  6:30 PM      Result Value Ref Range Status   Specimen Description SPUTUM   Final   Special Requests Normal   Final   Sputum evaluation     Final   Value: THIS SPECIMEN IS ACCEPTABLE. RESPIRATORY CULTURE REPORT TO FOLLOW.   Report Status 11/16/2013 FINAL   Final  CULTURE, RESPIRATORY (NON-EXPECTORATED)     Status: None   Collection Time    11/16/13  6:30 PM      Result Value Ref Range Status   Specimen Description SPUTUM   Final   Special Requests NONE   Final   Gram Stain     Final   Value: RARE WBC PRESENT,BOTH PMN AND MONONUCLEAR     RARE SQUAMOUS EPITHELIAL CELLS PRESENT     RARE GRAM POSITIVE COCCI IN PAIRS     Performed at [facility]   Culture     Final   Value: NORMAL OROPHARYNGEAL FLORA     Performed at [facility]   Report Status 11/19/2013 FINAL   Final  URINE CULTURE     Status: None   Collection Time    11/21/13  10:28 PM      Result Value Ref Range Status   Specimen Description URINE, CATHETERIZED   Final   Special Requests NONE   Final   Culture  Setup Time     Final   Value: 11/21/2013 23:22     Performed at [place]     Final   Value: >=100,000 COLONIES/ML     Performed at [facility]   Culture     Final   Value: PSEUDOMONAS AERUGINOSA     ENTEROCOCCUS SPECIES     Performed at [facility]   Report Status 11/26/2013 FINAL   Final   Organism ID, Bacteria PSEUDOMONAS AERUGINOSA   Final   Organism ID, Bacteria ENTEROCOCCUS SPECIES   Final   Organism ID, Bacteria ENTEROCOCCUS SPECIES   Final  URINE CULTURE     Status: None   Collection Time    11/25/13 12:42 PM      Result Value Ref Range Status   Specimen Description URINE, CATHETERIZED   Final   Special Requests none   Final   Culture  Setup Time     Final   Value: 11/25/2013 18:19     Performed at [place]     Final   Value: >=100,000 COLONIES/ML     Performed at [facility]   Culture     Final   Value: Multiple bacterial morphotypes present, none predominant. Suggest appropriate recollection if clinically indicated.     Performed at [facility]   Report Status 11/26/2013 FINAL   Final    Medical History: Past Medical History  Diagnosis Date  . Right-sided heart failure   . COPD (chronic obstructive pulmonary disease)   . Emphysema   . Seizures   . Thyroid disease   . Depression   . Emphysema   . Arthritis     back and legs  . Blood transfusion without reported diagnosis   .  High cholesterol   . Anxiety   . Nerves   . Panic attack   . Thyroid disorder   . Hypothyroidism   . Pneumonia   . CHF (congestive heart failure)   . On home O2     4L N/C chronic  . Fibromyalgia     Assessment: 53 yo female with extensive pmh including severe O2 COPD, multiple admissions for PNA, now admitted with LE numbness r/t thoracic epidural  mass and s/p T8 thoracic epidural mass resection. Now in CIR and developed worsening SOB and hypoxia. Pharmacy is consulted to start vancomycin and zosyn empiric for pneumonia. She is afebrile, scr 0.97 on 4/8, est. crcl ~ 60 ml/min. BMET pending.   Goal of Therapy:  Vancomycin trough level 15-20 mcg/ml  Plan:  - Vancomycin 750mg  IV Q 12 hrs - Zosyn 3.375g IV Q8 hrs (4 hr infusion) - F/u renal function and cultures if available, vancomycin trough at steady state if indicated.  Maryanna Shape, PharmD, BCPS  Clinical Pharmacist  Pager: [phone]   12/05/2013,12:08 PM

## 2013-12-05 NOTE — Progress Notes (Signed)
Patient unable to void.  In and out cath performed for 625 ml clear, yellow urine.  Patient tolerated well.  Continue to note white substance in vaginal area.

## 2013-12-05 NOTE — Progress Notes (Signed)
Physical Therapy Note  Patient Details  Name: [name] MRN: [id number] Date of Birth: November 12, 1960 Today's Date: 12/05/2013 0850-0905, 15 min individual therapy; 30 missed due to Xray Pt voiced abdominal pain with scooting forward in w/c On 4.5 L Os via [place]  Resting HR 131; O2 sats 93%; discussed with PA who recommended pt return to bed for Xray.    Pt positioned w/c in congested area for transfer with mod assist; max VCs for sequencing positioning w/c, removing legrests, brakes, etc.  Pt coughing up yellow sputum, SOB after using UEs for pushing w/c briefly.  SBT to L uphill +2 total assist.  Max assist sit> supine.  In flat bed, pt assisted with scooting up in bed.  HOB raised and all needs left in reach.    Awaiting transport to Xray.  Frederic Jericho 12/05/2013, 7:50 AM

## 2013-12-05 NOTE — Progress Notes (Signed)
[place] PHYSICAL MEDICINE & REHABILITATION     PROGRESS NOTE    Subjective/Complaints: Good night. RN reports oxygen sats in the 80's===pt reports no distress. Occasional cough  A 12 point review of systems has been performed and if not noted above is otherwise negative.   Objective: Vital Signs: Blood pressure 134/80, pulse 86, temperature 99 F (37.2 C), temperature source Oral, resp. rate 19, height 5\' 2"  (1.575 m), weight 69.3 kg (152 lb 12.5 oz), SpO2 86.00%. No results found. No results found for this basename: WBC, HGB, HCT, PLT,  in the last 72 hours No results found for this basename: NA, K, CL, CO, GLUCOSE, BUN, CREATININE, CALCIUM,  in the last 72 hours CBG (last 3)  No results found for this basename: GLUCAP,  in the last 72 hours  Wt Readings from Last 3 Encounters:  12/05/13 69.3 kg (152 lb 12.5 oz)  11/14/13 73.755 kg (162 lb 9.6 oz)  11/14/13 73.755 kg (162 lb 9.6 oz)    Physical Exam:  Constitutional: She is oriented to person, place, and time. She appears well-developed and well-nourished.  HENT: dentition fair. Oral mucosa pink/moist  Head: Normocephalic and atraumatic.  Eyes: Conjunctivae are normal.  Cardiovascular: Normal rate and regular rhythm. No murmur  Respiratory: No respiratory distress. A few rhonchi. Decreased air movement overall GI: Soft. Bowel sounds are normal. She exhibits no distension. There is no tenderness.  Musculoskeletal: She exhibits no edema.  No calf tenderness Neurological: She is alert and oriented to person, place, and time.  Paraparesis with sensory deficits. DTR absent BLE. LLE tr to 1 to 1+/5 HF,KE. Tr at ankle. RLE is 1+hf, 2-ke, 3 ankle. Sensation 1/2 LLE 1+ RLE. T8 Sensory level. Tone LE minimal to none Skin: Skin is warm and dry.  Psychiatric: She has a normal mood and affect. Her behavior is normal.    Assessment/Plan: 1. 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 Physiatrist is providing close team supervision and 24 hour management of active medical problems listed below. Physiatrist and rehab team continue to assess barriers to discharge/monitor patient progress toward functional and medical goals.     FIM: FIM - Bathing Bathing Steps Patient Completed: Chest;Right Arm;Left Arm;Abdomen;Front perineal area;Buttocks;Right upper leg;Left upper leg Bathing: 4: Min-Patient completes 8-9 50f 10 parts or 75+ percent  FIM - Upper Body Dressing/Undressing Upper body dressing/undressing steps patient completed: Thread/unthread right sleeve of pullover shirt/dresss;Thread/unthread left sleeve of pullover shirt/dress;Put head through opening of pull over shirt/dress;Pull shirt over trunk Upper body dressing/undressing: 5: Set-up assist to: Obtain clothing/put away FIM - Lower Body Dressing/Undressing Lower body dressing/undressing steps patient completed: Thread/unthread right underwear leg;Thread/unthread right pants leg Lower body dressing/undressing: 1: Total-Patient completed less than 25% of tasks  FIM - Musician Devices: Grab bar or rail for support Toileting: 1: Total-Patient completed zero steps, helper did all 3  FIM - Radio producer Devices: Recruitment consultant Transfers: 0-Activity did not occur  FIM - Control and instrumentation engineer Devices: Arm rests;Sliding board Bed/Chair Transfer: 2: Supine > Sit: Max A (lifting assist/Pt. 25-49%);2: Bed > Chair or W/C: Max A (lift and lower assist)  FIM - Locomotion: Wheelchair Distance: 150 Locomotion: Wheelchair: 5: Travels 150 ft or more: maneuvers on rugs and over door sills with supervision, cueing or coaxing FIM - Locomotion: Ambulation Locomotion: Ambulation Assistive Devices:  (unable to  perform safely) Locomotion:  Ambulation: 0: Activity did not occur  Comprehension Comprehension Mode: Auditory Comprehension: 5-Understands complex 90% of the time/Cues < 10% of the time  Expression Expression Mode: Verbal Expression: 5-Expresses basic 90% of the time/requires cueing < 10% of the time.  Social Interaction Social Interaction: 6-Interacts appropriately with others with medication or extra time (anti-anxiety, antidepressant).  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 DVT Prophylaxis/Anticoagulation: Mechanical: Antiembolism stockings, knee (TED hose) Bilateral lower extremities  Sequential compression devices, below knee Bilateral lower extremities  -dopplers negative.  2. Chronic pain due to fibromyalgia as well as back pain with neuropathy/ Pain Management: reduce oxycodone 5 mg qid  as well Lyrica 100 mg tid and cymbalta bid    -follow for activity tolerance 3. Depression with anxiety and panic attacks/Mood: Team to provide ego support. LCSW to follow for evaluation and support. Will continue cymbalta bid, xanax 0.5 mg tid as well as trazodone at bedtime for mood and insomnia.  4.Neuropsych: This patient is capable of making decisions on her own behalf.  5. Severe COPD: Continue oxygen 4 liters--used at home. Continue spiriva and dulera.    -albuterol changed to xopenex neb--  -scheduled mucinex  .   -recheck cxr today   -continue IS, FV    -tobacco abstinence 6.Neurogenic Bowel and bladder :    Had bm yesterday  -on linzess  -reduced narcotics,     -results with SMOG   -senna    7. Chronic renal failure: Baseline BUN/Cr- 39/1.80 at admission. Near baseline---continue current meds  -BUN/CR close to baseline  8. Diastolic  dysfunction with tachycardia (as compensatory for hypoxia)   -lasix resumed at 40mg  daily 9. Surgical- decadron taper   10. Bladder: I and O cath prn/ voiding trial---may need to go home with a foley  -continue low dose flomax  -off abx   LOS (Days) 19 A FACE TO FACE EVALUATION WAS PERFORMED  Meredith Staggers 12/05/2013 8:08 AM

## 2013-12-05 NOTE — Progress Notes (Signed)
Respiratory therapist, Theadora Rama, notified of patient with low O2 Sat 89%, rattling in throat, and patient's need for prn breathing tx.

## 2013-12-06 ENCOUNTER — Inpatient Hospital Stay (HOSPITAL_COMMUNITY): Payer: Medicare Other

## 2013-12-06 ENCOUNTER — Inpatient Hospital Stay (HOSPITAL_COMMUNITY): Payer: Medicare Other | Admitting: Physical Therapy

## 2013-12-06 ENCOUNTER — Inpatient Hospital Stay (HOSPITAL_COMMUNITY): Payer: Medicare Other | Admitting: *Deleted

## 2013-12-06 DIAGNOSIS — D497 Neoplasm of unspecified behavior of endocrine glands and other parts of nervous system: Secondary | ICD-10-CM

## 2013-12-06 DIAGNOSIS — K560 Paralytic ileus: Secondary | ICD-10-CM

## 2013-12-06 DIAGNOSIS — F341 Dysthymic disorder: Secondary | ICD-10-CM

## 2013-12-06 DIAGNOSIS — N189 Chronic kidney disease, unspecified: Secondary | ICD-10-CM

## 2013-12-06 DIAGNOSIS — IMO0001 Reserved for inherently not codable concepts without codable children: Secondary | ICD-10-CM

## 2013-12-06 DIAGNOSIS — J441 Chronic obstructive pulmonary disease with (acute) exacerbation: Secondary | ICD-10-CM

## 2013-12-06 DIAGNOSIS — J449 Chronic obstructive pulmonary disease, unspecified: Secondary | ICD-10-CM

## 2013-12-06 LAB — BASIC METABOLIC PANEL
BUN: 36 mg/dL — AB (ref 6–23)
CO2: 26 mEq/L (ref 19–32)
Calcium: 8.5 mg/dL (ref 8.4–10.5)
Chloride: 98 mEq/L (ref 96–112)
Creatinine, Ser: 0.77 mg/dL (ref 0.50–1.10)
GFR calc Af Amer: >90 mL/min (ref >90)
GFR calc non Af Amer: >90 mL/min (ref >90)
GLUCOSE: 199 mg/dL — AB (ref 70–99)
Potassium: 4.1 mEq/L (ref 3.7–5.3)
Sodium: 138 mEq/L (ref 137–147)

## 2013-12-06 NOTE — Progress Notes (Signed)
Social Work Lennart Pall, [employer] Social Worker Signed  Patient Care Conference Service date: 12/06/2013 9:20 PM  Inpatient RehabilitationTeam Conference and Plan of Care Update Date: 12/06/2013   Time: 2:05 PM     Patient Name: [name]       Medical Record Number: [id number]   Date of Birth: March 08, 1961 Sex: Female         Room/Bed: 4W18C/4W18C-01 Payor Info: Payor: MEDICARE / Plan: MEDICARE PART A AND B / Product Type: *No Product type* /   Admitting Diagnosis: resection epidural mass   Admit Date/Time:  11/16/2013  4:05 PM Admission Comments: No comment available   Primary Diagnosis:  <principal problem not specified> Principal Problem: <principal problem not specified>    Patient Active Problem List     Diagnosis  Date Noted   .  Thoracic spine tumor  11/16/2013   .  Urinary retention  11/12/2013   .  Left leg weakness  11/12/2013   .  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  Dyspnea  10/18/2013   .  Right upper lobe pneumonia/infiltrate  09/20/2013   .  Lichen planus vulva  09/20/2013   .  Thoracic compression fracture  09/20/2013   .  COPD exacerbation  09/19/2013   .  Acute delirium  09/07/2013   .  Chronic diastolic congestive heart failure, NYHA class 1  09/07/2013   .  Hypernatremia  09/06/2013   .  Shingles  09/06/2013   .  History of depression, chronic antidepressants  09/06/2013   .  Physical deconditioning  09/06/2013   .  Hypercarbia, acute (resolved) on chronic  09/02/2013   .  Acute-on-chronic respiratory failure  09/02/2013   .  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  HYPERTRIGLYCERIDEMIA  09/16/2006   .  History of anxiety - chronic alprazolam  06/10/2006   .  TOBACCO ABUSE  06/10/2006   .  COPD without acute bronchospasm  06/10/2006   .  PULMONARY NODULE  06/10/2006   .  GERD, chronic PPI use  06/10/2006   .  LOW BACK PAIN  06/10/2006   .  FIBROMYALGIA  06/10/2006   .  SLEEP APNEA  06/10/2006   .  CERVICAL  CANCER, HX OF  06/10/2006   .  HYSTERECTOMY, HX OF  06/10/2006     Expected Discharge Date: Expected Discharge Date:  (TBD)  Team Members Present: Physician leading conference: Dr. Alger Simons Social Worker Present: Lennart Pall, LCSW Nurse Present: Rozetta Nunnery, RN PT Present: Canary Brim, PT OT Present: Forde Radon, OT;Patricia Lissa Hoard, OT SLP Present: Weston [place], SLP PPS Coordinator present : Daiva Nakayama, RN, CRRN;Becky Alwyn Ren, PT        Current Status/Progress  Goal  Weekly Team Focus   Medical     pneumonia left lower lobe. copd severe  prepare medicall for dc  pneumonia/copd rxs---plan for home   Bowel/Bladder     Continent of bowel.  Requires in and out cath q 8 hours by staff. Scanned at [phone] for 394; cath for 400.  LBM: 11/28/13  To develop the urge to void  Timed toileting during the day   Swallow/Nutrition/ Hydration     regular and thin liquids, risk for aspiration suspected to be high due to respiratory issues  supervision with least restrictive PO  MBS, diet tolerance   ADL's     Supervision UB B&D. Mod-Max LB, Min-Mod Xfer  Supervision -  Mod A (downgraded)  Family ed, SB xfers, energy conservation, UE strengthening   Mobility     mod A  mod A  d/c planning   Communication            Safety/Cognition/ Behavioral Observations           Pain     Oxy IR 5 mg, Robaxin 500 mg given at6 0533 for back pain.  Pain managed at 3 or less  Assess for and treat pain q shift.   Skin     Generalized bruises; white cluster of blister in perinum area/buttucks  No new skin breakdown  Assess skin for breakdown q shift    Rehab Goals Patient on target to meet rehab goals: Yes Rehab Goals Revised: Downgrade bed mobility to supine in bed; d/c standing gait goal and sliding board transfers to mod for uphill txs. *See Care Plan and progress notes for long and short-term goals.    Barriers to Discharge:  pneumonia     Possible Resolutions to Barriers:    rx pneumonia       Discharge Planning/Teaching Needs:    Pt plans to go home with her husband after d/c from CIR.   Family has been involved in family education.    Team Discussion:    Now with medical issues - pna - and awaiting ID consult.  Likely to begin IV abx.  Plan MBS tomorrow.  Function ranging from supervision to max assist.  D/c planned on hold pending medical clearance.    Revisions to Treatment Plan:    D/c on hold pending medical clearance    Continued Need for Acute Rehabilitation Level of Care: The patient requires daily medical management by a physician with specialized training in physical medicine and rehabilitation for the following conditions: Daily direction of a multidisciplinary physical rehabilitation program to ensure safe treatment while eliciting the highest outcome that is of practical value to the patient.: Yes Daily medical management of patient stability for increased activity during participation in an intensive rehabilitation regime.: Yes Daily analysis of laboratory values and/or radiology reports with any subsequent need for medication adjustment of medical intervention for : Neurological problems;Post surgical problems;Pulmonary problems  Lennart Pall 12/06/2013, 9:21 PM          Patient ID: [name], female   DOB: 05-Dec-1960, 53 y.o.   MRN: [id number]

## 2013-12-06 NOTE — Consult Note (Signed)
[facility] for Infectious Disease    Date of Admission:  11/16/2013  Date of Consult:  12/06/2013  Reason for Consult: Recurrent pneumonia Referring Physician: Dr. Elsworth Soho   HPI: [name] is an 53 y.o. 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 CXR showed left base infiltrate, also seen on CT chest with lingular infiltrate ultimately started on vancomycin and zosyn for HCAP. We were consulted to assist in management and workup of this patient with recurrent PNA , sever COPD.She is on steroids per Neurosurgery.   Past Medical History  Diagnosis Date  . Right-sided heart failure   . COPD (chronic obstructive pulmonary disease)   . Emphysema   . Seizures   . Thyroid disease   . Depression   . Emphysema   . Arthritis     back and legs  . Blood transfusion without reported diagnosis   . High cholesterol   . Anxiety   . Nerves   . Panic attack   . Thyroid disorder   . Hypothyroidism   . Pneumonia   . CHF (congestive heart failure)   . On home O2     4L N/C chronic  . Fibromyalgia     Past Surgical History  Procedure Laterality Date  . Abdominal hysterectomy    . Cesarean section    . Cholecystectomy    . Laser removal condyloma    . Laminectomy N/A 11/12/2013    Procedure: THORACIC LAMINECTOMY FOR MASS;  Surgeon: Eustace Moore, MD;  Location: [facility];  Service: Neurosurgery;  Laterality: N/A;  ergies:   Allergies  Allergen Reactions  . Tape Itching and Rash     Medications: I have reviewed patients current medications as documented in Epic Anti-infectives   Start     Dose/Rate Route Frequency Ordered Stop   12/05/13 1400  piperacillin-tazobactam (ZOSYN) IVPB 3.375 g     3.375 g 12.5 mL/hr over 240  Minutes Intravenous 3 times per day 12/05/13 1217     12/05/13 1300  vancomycin (VANCOCIN) IVPB 750 mg/150 ml premix     750 mg 150 mL/hr over 60 Minutes Intravenous Every 12 hours 12/05/13 1217     11/25/13 1600  cefTAZidime (FORTAZ) 1 g in dextrose 5 % 50 mL IVPB  Status:  Discontinued     1 g 100 mL/hr over 30 Minutes Intravenous Every 8 hours 11/25/13 1426 11/26/13 0836   11/16/13 2000  acyclovir (ZOVIRAX) tablet 800 mg     800 mg Oral 2 times daily 11/16/13 1635        Social History:  reports that she quit smoking about 4 months ago. She has never used smokeless tobacco. She reports that she does not drink alcohol or use illicit drugs.  Family History  Problem Relation Age of Onset  . Cancer Father     As in HPI and primary teams notes otherwise 12 point review of systems is negative  Blood pressure 130/80, pulse 90, temperature 98 F (36.7 C), temperature source Oral, resp. rate 20, height $RemoveBe'5\' 2"'DbMuhpchA$  (1.575 m), weight 158 lb 8.2 oz (71.9 kg), SpO2 90.00%. General: Alert and awake, oriented x3, anxious HEENT: anicteric sclera, pupils reactive to light and accommodation, oropharynx clear and without exudate CVS regular  rate, normal r,  no murmur rubs or gallops Chest: rhonchi throughout Abdomen: soft nontender, nondistended, normal bowel sounds, Extremities: no  clubbing or edema noted bilaterally  Neuro: nonfocal,    Results for orders placed during the hospital encounter of 11/16/13 (from the past 48 hour(s))  BASIC METABOLIC PANEL     Status: Abnormal   Collection Time    12/05/13 12:00 PM      Result Value Ref Range   Sodium 137  137 - 147 mEq/L   Potassium 5.6 (*) 3.7 - 5.3 mEq/L   Comment: HEMOLYSIS AT THIS LEVEL MAY AFFECT RESULT   Chloride 99  96 - 112 mEq/L   CO2 20  19 - 32 mEq/L   Glucose, Bld 403 (*) 70 - 99 mg/dL   BUN 43 (*) 6 - 23 mg/dL   Creatinine, Ser 0.78  0.50 - 1.10 mg/dL   Calcium 8.9  8.4 - 10.5 mg/dL   GFR calc non Af Amer >90  >90 mL/min   GFR  calc Af Amer >90  >90 mL/min   Comment: (NOTE)     The eGFR has been calculated using the CKD EPI equation.     This calculation has not been validated in all clinical situations.     eGFR's persistently <90 mL/min signify possible Chronic Kidney     Disease.  BASIC METABOLIC PANEL     Status: Abnormal   Collection Time    12/06/13  7:13 AM      Result Value Ref Range   Sodium 138  137 - 147 mEq/L   Potassium 4.1  3.7 - 5.3 mEq/L   Chloride 98  96 - 112 mEq/L   CO2 26  19 - 32 mEq/L   Glucose, Bld 199 (*) 70 - 99 mg/dL   BUN 36 (*) 6 - 23 mg/dL   Creatinine, Ser 0.77  0.50 - 1.10 mg/dL   Calcium 8.5  8.4 - 10.5 mg/dL   GFR calc non Af Amer >90  >90 mL/min   GFR calc Af Amer >90  >90 mL/min   Comment: (NOTE)     The eGFR has been calculated using the CKD EPI equation.     This calculation has not been validated in all clinical situations.     eGFR's persistently <90 mL/min signify possible Chronic Kidney     Disease.      Component Value Date/Time   SDES URINE, CATHETERIZED 11/25/2013 1242   SPECREQUEST none 11/25/2013 1242   CULT  Value: Multiple bacterial morphotypes present, none predominant. Suggest appropriate recollection if clinically indicated. Performed at [facility] 11/25/2013 1242   REPTSTATUS 11/26/2013 FINAL 11/25/2013 1242   Dg Chest 2 View  12/06/2013   CLINICAL DATA:  COPD.  Shortness of breath.  Followup of pneumonia.  EXAM: CHEST  2 VIEW  COMPARISON:  CT CHEST W/O CM dated 12/05/2013; DG CHEST 2 VIEW dated 12/05/2013  FINDINGS: Hyperinflation. Osteopenia. Thoracic compression deformity which is better evaluated on yesterday's CT.  Midline trachea. Mild cardiomegaly. No pleural effusion or pneumothorax. Chin overlies the apices. Right base volume loss. Slight improvement in left base airspace disease. No congestive failure.  IMPRESSION: Slight improvement in left base airspace disease/ pneumonia.  COPD with right base atelectasis and volume loss.   Electronically  Signed   By: Abigail Miyamoto M.D.   On: 12/06/2013 08:34   Dg Chest 2 View  12/05/2013   CLINICAL DATA:  Cough with hypoxia  EXAM: CHEST  2  VIEW  COMPARISON:  November 21, 2013  FINDINGS: There is increased opacity in the left base compared to recent prior study. This finding is felt to represent progression of pneumonia. There is patchy atelectasis in the right base which is stable. Heart is prominent with normal pulmonary vascularity, stable. There appears to be some underlying emphysematous change. Compression fractures again noted in the thoracic spine.  IMPRESSION: Increase in left base infiltrate. Otherwise no change from recent prior study.   Electronically Signed   By: Lowella Grip M.D.   On: 12/05/2013 10:40   Ct Chest Wo Contrast  12/05/2013   CLINICAL DATA:  Nonproductive cough, malaise and shortness of breath. History of left lower lobe pneumonia.  EXAM: CT CHEST WITHOUT CONTRAST  TECHNIQUE: Multidetector CT imaging of the chest was performed following the standard protocol without IV contrast.  COMPARISON:  09/19/2013 and 07/24/2013. Prior chest radiographs dating back to 09/19/2013.  FINDINGS: No pathologically enlarged mediastinal lymph nodes. Hilar regions are difficult to definitively evaluate without IV contrast. No axillary adenopathy. Pulmonary arteries are enlarged. Coronary artery calcification. Heart is at the upper limits of normal in size. No pericardial effusion.  Fairly severe centrilobular emphysema. Lingular and left lower lobe airspace consolidation. Scarring in the right middle lobe. No pleural fluid. Airway is unremarkable.  Incidental imaging of the upper abdomen shows the visualized portions of the liver, adrenal glands, spleen and stomach to be grossly unremarkable. Low-attenuation lesions in the upper pole right kidney are incompletely imaged. No upper abdominal adenopathy. No worrisome lytic or sclerotic lesions. T7 and T8 compression fractures are stable. Slight increase in  superior endplate compression involving T9.  IMPRESSION: 1. Patchy airspace disease in the lingula and left lower lobe is indicative of pneumonia. Continued follow-up to clearing is recommended to exclude underlying malignancy. 2. Three-vessel coronary artery calcification. 3. Pulmonary arterial enlargement, indicative of pulmonary arterial hypertension. 4. Thoracic compression fractures. Superior endplate compression involving T9 appears mildly progressive.   Electronically Signed   By: Lorin Picket M.D.   On: 12/05/2013 15:20     Recent Results (from the past 720 hour(s))  URINE CULTURE     Status: None   Collection Time    11/12/13  9:51 AM      Result Value Ref Range Status   Specimen Description URINE, CATHETERIZED   Final   Special Requests NONE   Final   Culture  Setup Time     Final   Value: 11/12/2013 17:23     Performed at [facility]     Final   Value: NO GROWTH     Performed at [facility]   Culture     Final   Value: NO GROWTH     Performed at [facility]   Report Status 11/13/2013 FINAL   Final  MRSA PCR SCREENING     Status: None   Collection Time    11/12/13  6:40 PM      Result Value Ref Range Status   MRSA by PCR NEGATIVE  NEGATIVE Final   Comment:            The GeneXpert MRSA Assay (FDA     approved for NASAL specimens     only), is one component of a     comprehensive MRSA colonization     surveillance program. It is not     intended to diagnose MRSA     infection nor to guide or  monitor treatment for     MRSA infections.  CULTURE, BLOOD (ROUTINE X 2)     Status: None   Collection Time    11/12/13  6:48 PM      Result Value Ref Range Status   Specimen Description BLOOD RIGHT ARM   Final   Special Requests BOTTLES DRAWN AEROBIC ONLY 5CC   Final   Culture  Setup Time     Final   Value: 11/13/2013 00:10     Performed at [facility]   Culture     Final   Value: NO GROWTH 5 DAYS     Performed at  [facility]   Report Status 11/19/2013 FINAL   Final  CULTURE, BLOOD (ROUTINE X 2)     Status: None   Collection Time    11/12/13  7:00 PM      Result Value Ref Range Status   Specimen Description BLOOD RIGHT HAND   Final   Special Requests BOTTLES DRAWN AEROBIC ONLY 8CC   Final   Culture  Setup Time     Final   Value: 11/13/2013 00:10     Performed at [facility]   Culture     Final   Value: NO GROWTH 5 DAYS     Performed at [facility]   Report Status 11/19/2013 FINAL   Final  CULTURE, EXPECTORATED SPUTUM-ASSESSMENT     Status: None   Collection Time    11/16/13  6:30 PM      Result Value Ref Range Status   Specimen Description SPUTUM   Final   Special Requests Normal   Final   Sputum evaluation     Final   Value: THIS SPECIMEN IS ACCEPTABLE. RESPIRATORY CULTURE REPORT TO FOLLOW.   Report Status 11/16/2013 FINAL   Final  CULTURE, RESPIRATORY (NON-EXPECTORATED)     Status: None   Collection Time    11/16/13  6:30 PM      Result Value Ref Range Status   Specimen Description SPUTUM   Final   Special Requests NONE   Final   Gram Stain     Final   Value: RARE WBC PRESENT,BOTH PMN AND MONONUCLEAR     RARE SQUAMOUS EPITHELIAL CELLS PRESENT     RARE GRAM POSITIVE COCCI IN PAIRS     Performed at [facility]   Culture     Final   Value: NORMAL OROPHARYNGEAL FLORA     Performed at [facility]   Report Status 11/19/2013 FINAL   Final  URINE CULTURE     Status: None   Collection Time    11/21/13 10:28 PM      Result Value Ref Range Status   Specimen Description URINE, CATHETERIZED   Final   Special Requests NONE   Final   Culture  Setup Time     Final   Value: 11/21/2013 23:22     Performed at [facility] Count     Final   Value: >=100,000 COLONIES/ML     Performed at [facility]   Culture     Final   Value: PSEUDOMONAS AERUGINOSA     ENTEROCOCCUS SPECIES     Performed at [facility]   Report  Status 11/26/2013 FINAL   Final   Organism ID, Bacteria PSEUDOMONAS AERUGINOSA   Final   Organism ID, Bacteria ENTEROCOCCUS SPECIES   Final   Organism ID, Bacteria ENTEROCOCCUS SPECIES   Final  URINE CULTURE     Status: None   Collection Time    11/25/13 12:42 PM      Result Value Ref Range Status   Specimen Description URINE, CATHETERIZED   Final   Special Requests none   Final   Culture  Setup Time     Final   Value: 11/25/2013 18:19     Performed at [place]     Final   Value: >=100,000 COLONIES/ML     Performed at [facility]   Culture     Final   Value: Multiple bacterial morphotypes present, none predominant. Suggest appropriate recollection if clinically indicated.     Performed at [facility]   Report Status 11/26/2013 FINAL   Final     Impression/Recommendation  Active Problems:   Thoracic spine tumor   [name] is a 54 y.o. female with  Severe COPD, recurrent PNA, recent spinal mass resection on steroids with hypoxia on rx for HCAP. 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 Burkholderia Sensi to ceftaz, zosyn , imi, FQ --try to get further sputum --consider de-escalation away from vancomycin   #2 Screening: check HIV and hepatitis panel  #3 T mass: ? Pathology shows to be cause here  Dr. Baxter Flattery will be covering tomorrow.   12/06/2013, 8:02 PM   Thank you so much for this interesting consult  [facility] [phone] (pager) [phone] (office) 12/06/2013, 8:02 PM  [place] 12/06/2013, 8:02 PM

## 2013-12-06 NOTE — Progress Notes (Signed)
Physical Therapy Note  Patient Details  Name: [name] MRN: [id number] Date of Birth: Nov 27, 1960 Today's Date: 12/06/2013  Time: 1000-1054 54 minutes  1:1 No c/o pain.  Treatment session focused on functional transfers with sliding board and w/c mobility.  Pt able to perform w/c mobility in home and controlled environments with supervision, cuing for tight spaces.  Sliding board transfers multiple attempts with mod A, cues for UE placement and wt shifting, assist with set up, pt able to direct set up without cuing.  Seated balance with lateral leans with mod A for pushing up to midline after leaning.  Bed mobility with mod A.  Pt states she has w/c ramp and hospital bed for home.  Pt expresses hope she can d/c home tomorrow with her family.   Kennith Gain 12/06/2013, 10:55 AM

## 2013-12-06 NOTE — Patient Care Conference (Signed)
Inpatient RehabilitationTeam Conference and Plan of Care Update Date: 12/06/2013   Time: 2:05 PM    Patient Name: [name]      Medical Record Number: [id number]  Date of Birth: 07/03/1961 Sex: Female         Room/Bed: 4W18C/4W18C-01 Payor Info: Payor: MEDICARE / Plan: MEDICARE PART A AND B / Product Type: *No Product type* /    Admitting Diagnosis: resection epidural mass  Admit Date/Time:  11/16/2013  4:05 PM Admission Comments: No comment available   Primary Diagnosis:  <principal problem not specified> Principal Problem: <principal problem not specified>  Patient Active Problem List   Diagnosis Date Noted  . Thoracic spine tumor 11/16/2013  . Urinary retention 11/12/2013  . Left leg weakness 11/12/2013  . 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 Dyspnea 10/18/2013  . Right upper lobe pneumonia/infiltrate 09/20/2013  . Lichen planus vulva 53/66/4403  . Thoracic compression fracture 09/20/2013  . COPD exacerbation 09/19/2013  . Acute delirium 09/07/2013  . Chronic diastolic congestive heart failure, NYHA class 1 09/07/2013  . Hypernatremia 09/06/2013  . Shingles 09/06/2013  . History of depression, chronic antidepressants 09/06/2013  . Physical deconditioning 09/06/2013  . Hypercarbia, acute (resolved) on chronic 09/02/2013  . Acute-on-chronic respiratory failure 09/02/2013  . 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 HYPERTRIGLYCERIDEMIA 09/16/2006  . History of anxiety - chronic alprazolam 06/10/2006  . TOBACCO ABUSE 06/10/2006  . COPD without acute bronchospasm 06/10/2006  . PULMONARY NODULE 06/10/2006  . GERD, chronic PPI use 06/10/2006  . LOW BACK PAIN 06/10/2006  . FIBROMYALGIA 06/10/2006  . SLEEP APNEA 06/10/2006  . CERVICAL CANCER, HX OF 06/10/2006  . HYSTERECTOMY, HX OF 06/10/2006    Expected Discharge Date: Expected Discharge Date:  (TBD)  Team Members Present: Physician leading conference: Dr. Alger Simons Social Worker Present: Lennart Pall, LCSW Nurse Present: Rozetta Nunnery, RN PT Present: Canary Brim, PT OT Present: Forde Radon, OT;Patricia Lissa Hoard, OT SLP Present: Weston Onyinyechi, SLP PPS Coordinator present : Daiva Nakayama, RN, CRRN;Becky Alwyn Ren, PT     Current Status/Progress Goal Weekly Team Focus  Medical   pneumonia left lower lobe. copd severe  prepare medicall for dc  pneumonia/copd rxs---plan for home   Bowel/Bladder   Continent of bowel.  Requires in and out cath q 8 hours by staff. Scanned at [phone] for 394; cath for 400.  LBM: 11/28/13  To develop the urge to void  Timed toileting during the day   Swallow/Nutrition/ Hydration   regular and thin liquids, risk for aspiration suspected to be high due to respiratory issues  supervision with least restrictive PO  MBS, diet tolerance   ADL's   Supervision UB B&D. Mod-Max LB, Min-Mod Xfer  Supervision - Mod A (downgraded)  Family ed, SB xfers, energy conservation, UE strengthening   Mobility   mod A  mod A  d/c planning   Communication             Safety/Cognition/ Behavioral Observations            Pain   Oxy IR 5 mg, Robaxin 500 mg given at6 0533 for back pain.  Pain managed at 3 or less  Assess for and treat pain q shift.   Skin   Generalized bruises; white cluster of blister in perinum area/buttucks  No new skin breakdown  Assess skin for breakdown q shift    Rehab Goals Patient on target to meet rehab goals: Yes Rehab Goals Revised: Downgrade  bed mobility to supine in bed; d/c standing gait goal and sliding board transfers to mod for uphill txs. *See Care Plan and progress notes for long and short-term goals.  Barriers to Discharge: pneumonia    Possible Resolutions to Barriers:  rx pneumonia    Discharge Planning/Teaching Needs:  Pt plans to go home with her husband after d/c from CIR.  Family has been involved in family education.   Team Discussion:  Now with medical issues - pna - and awaiting ID consult.   Likely to begin IV abx.  Plan MBS tomorrow.  Function ranging from supervision to max assist.  D/c planned on hold pending medical clearance.   Revisions to Treatment Plan:  D/c on hold pending medical clearance   Continued Need for Acute Rehabilitation Level of Care: The patient requires daily medical management by a physician with specialized training in physical medicine and rehabilitation for the following conditions: Daily direction of a multidisciplinary physical rehabilitation program to ensure safe treatment while eliciting the highest outcome that is of practical value to the patient.: Yes Daily medical management of patient stability for increased activity during participation in an intensive rehabilitation regime.: Yes Daily analysis of laboratory values and/or radiology reports with any subsequent need for medication adjustment of medical intervention for : Neurological problems;Post surgical problems;Pulmonary problems  Lennart Pall 12/06/2013, 9:21 PM

## 2013-12-06 NOTE — Progress Notes (Signed)
[facility]ILIT[facility]     PROGRESS NOTE    Subjective/Complaints: Feeling better today. Denies pain or distress this am. Bladder still not emptying A 12 point review of systems has been performed and if not noted above is otherwise negative.   Objective: Vital Signs: Blood pressure 130/80, pulse 90, temperature 98 F (36.7 C), temperature source Oral, resp. rate 20, height 5\' 2"  (1.575 m), weight 71.9 kg (158 lb 8.2 oz), SpO2 94.00%. Dg Chest 2 View  12/05/2013   CLINICAL DATA:  Cough with hypoxia  EXAM: CHEST  2 VIEW  COMPARISON:  November 21, 2013  FINDINGS: There is increased opacity in the left base compared to recent prior study. This finding is felt to represent progression of pneumonia. There is patchy atelectasis in the right base which is stable. Heart is prominent with normal pulmonary vascularity, stable. There appears to be some underlying emphysematous change. Compression fractures again noted in the thoracic spine.  IMPRESSION: Increase in left base infiltrate. Otherwise no change from recent prior study.   Electronically Signed   By: Lowella Grip M.D.   On: 12/05/2013 10:40   Ct Chest Wo Contrast  12/05/2013   CLINICAL DATA:  Nonproductive cough, malaise and shortness of breath. History of left lower lobe pneumonia.  EXAM: CT CHEST WITHOUT CONTRAST  TECHNIQUE: Multidetector CT imaging of the chest was performed following the standard protocol without IV contrast.  COMPARISON:  09/19/2013 and 07/24/2013. Prior chest radiographs dating back to 09/19/2013.  FINDINGS: No pathologically enlarged mediastinal lymph nodes. Hilar regions are difficult to definitively evaluate without IV contrast. No axillary adenopathy. Pulmonary arteries are enlarged. Coronary artery calcification. Heart is at the upper limits of normal in size. No pericardial effusion.  Fairly severe centrilobular emphysema. Lingular and left lower lobe airspace consolidation. Scarring in the  right middle lobe. No pleural fluid. Airway is unremarkable.  Incidental imaging of the upper abdomen shows the visualized portions of the liver, adrenal glands, spleen and stomach to be grossly unremarkable. Low-attenuation lesions in the upper pole right kidney are incompletely imaged. No upper abdominal adenopathy. No worrisome lytic or sclerotic lesions. T7 and T8 compression fractures are stable. Slight increase in superior endplate compression involving T9.  IMPRESSION: 1. Patchy airspace disease in the lingula and left lower lobe is indicative of pneumonia. Continued follow-up to clearing is recommended to exclude underlying malignancy. 2. Three-vessel coronary artery calcification. 3. Pulmonary arterial enlargement, indicative of pulmonary arterial hypertension. 4. Thoracic compression fractures. Superior endplate compression involving T9 appears mildly progressive.   Electronically Signed   By: Lorin Picket M.D.   On: 12/05/2013 15:20   No results found for this basename: WBC, HGB, HCT, PLT,  in the last 72 hours  Recent Labs  12/05/13 1200  NA 137  K 5.6*  CL 99  GLUCOSE 403*  BUN 43*  CREATININE 0.78  CALCIUM 8.9   CBG (last 3)  No results found for this basename: GLUCAP,  in the last 72 hours  Wt Readings from Last 3 Encounters:  12/06/13 71.9 kg (158 lb 8.2 oz)  11/14/13 73.755 kg (162 lb 9.6 oz)  11/14/13 73.755 kg (162 lb 9.6 oz)    Physical Exam:  Constitutional: She is oriented to person, place, and time. She appears well-developed and well-nourished.  HENT: dentition fair. Oral mucosa pink/moist  Head: Normocephalic and atraumatic.  Eyes: Conjunctivae are normal.  Cardiovascular: Normal rate and regular rhythm. No murmur  Respiratory: No respiratory distress. A few  rhonchi. Decreased air movement overall especially at bases GI: Soft. Bowel sounds are normal. She exhibits no distension. There is no tenderness.  Musculoskeletal: She exhibits no edema.  No calf  tenderness Neurological: She is alert and oriented to person, place, and time.  Paraparesis with sensory deficits. DTR absent BLE. LLE tr to 1 to 1+/5 HF,KE. Tr at ankle. RLE is 1+hf, 2-ke, 3 ankle. Sensation 1/2 LLE 1+ RLE. T8 Sensory level. Tone LE minimal to none Skin: Skin is warm and dry.  Psychiatric: She has a normal mood and affect. Her behavior is normal.    Assessment/Plan: 1. 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 Physiatrist is providing close team supervision and 24 hour management of active medical problems listed below. Physiatrist and rehab team continue to assess barriers to discharge/monitor patient progress toward functional and medical goals.  Pneumonia will likely delay dc.     FIM: FIM - Bathing Bathing Steps Patient Completed: Chest;Right Arm;Left Arm;Abdomen;Front perineal area Bathing: 3: Mod-Patient completes 5-7 23f 10 parts or 50-74%  FIM - Upper Body Dressing/Undressing Upper body dressing/undressing steps patient completed: Thread/unthread right sleeve of pullover shirt/dresss;Thread/unthread left sleeve of pullover shirt/dress;Put head through opening of pull over shirt/dress;Pull shirt over trunk Upper body dressing/undressing: 5: Set-up assist to: Obtain clothing/put away FIM - Lower Body Dressing/Undressing Lower body dressing/undressing steps patient completed: Thread/unthread right underwear leg;Thread/unthread right pants leg Lower body dressing/undressing: 1: Total-Patient completed less than 25% of tasks  FIM - Musician Devices: Grab bar or rail for support Toileting: 1: Total-Patient completed zero steps, helper did all 3  FIM - Radio producer Devices: Recruitment consultant Transfers: 0-Activity did not occur  FIM - Financial trader Devices: Sliding board;Arm rests;Bed rails;HOB elevated Bed/Chair Transfer: 1: Two helpers;2: Sit > Supine: Max A (lifting assist/Pt. 25-49%)  FIM - Locomotion: Wheelchair Distance: 150 Locomotion: Wheelchair: 5: Travels 150 ft or more: maneuvers on rugs and over door sills with supervision, cueing or coaxing FIM - Locomotion: Ambulation Locomotion: Ambulation Assistive Devices:  (unable to perform safely) Locomotion: Ambulation: 0: Activity did not occur  Comprehension Comprehension Mode: Auditory Comprehension: 5-Understands complex 90% of the time/Cues < 10% of the time  Expression Expression Mode: Verbal Expression: 5-Expresses basic 90% of the time/requires cueing < 10% of the time.  Social Interaction Social Interaction: 6-Interacts appropriately with others with medication or extra time (anti-anxiety, antidepressant).  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 DVT Prophylaxis/Anticoagulation: Mechanical: Antiembolism stockings, knee (TED hose) Bilateral lower extremities  Sequential compression devices, below knee Bilateral lower extremities  -dopplers negative.  2. Chronic pain due to fibromyalgia as well as back pain with neuropathy/ Pain Management: reduce oxycodone 5 mg qid  as well Lyrica 100 mg tid and cymbalta bid    -follow for activity tolerance 3. Depression with anxiety and panic attacks/Mood: Team to provide ego support. LCSW to follow for evaluation and support. Will continue cymbalta bid, xanax 0.5 mg tid as well as trazodone at bedtime for mood and insomnia.  4.Neuropsych: This patient is capable of making decisions on her own behalf.  5. Severe COPD: Continue  oxygen 4 liters--used at home. Continue spiriva and dulera.    -albuterol changed to xopenex neb--  -scheduled mucinex  .   -LLL pneumonia---appreciate CCM help. On vanc and zosyn, (f/u cxr today)   -continue IS, FV    -tobacco abstinence  -remains at high risk for future problems given multiple issues above 6.Neurogenic Bowel and bladder :      -on linzess  -reduced narcotics,     -results with SMOG   -senna    7. Chronic renal failure: Baseline BUN/Cr- 39/1.80 at admission. Near baseline---continue current meds  -BUN/CR close to baseline   -labs pending 8. Diastolic dysfunction with tachycardia (as compensatory for hypoxia)   -lasix resumed at 40mg  daily 9. Surgical- decadron taper   10. Bladder: I and O cath prn/ voiding trial---will go home with a foley  -continue low dose flomax      LOS (Days) 20 A FACE TO FACE EVALUATION WAS PERFORMED  Meredith Staggers 12/06/2013 8:10 AM

## 2013-12-06 NOTE — Progress Notes (Signed)
Occupational Therapy Session Note  Patient Details  Name: [name] MRN: [id number] Date of Birth: 09-03-1960  Today's Date: 12/06/2013 Time: 8250-5397 Time Calculation (min): 15 min  Short Term Goals: Week 3:  OT Short Term Goal 1 (Week 3): STG=LTG due to anticipated discharge to home within 7 days  Skilled Therapeutic Interventions/Progress Updates: ADL-retraining with focus on discharge planning, patient ed on DME/AE use, and safety awareness.  Patient received supine in bed requesting setup with self-feeding.   OT recovered food tray and placed on bedside table.  Patient appeared alert and attentive, receptive for planned ADL session.   OT discussed discharge plan while patient ate 1 pancake; patient confirmed need for drop-arm commode.   During ADL prep, RN and radiology service arrived reporting interruption to session due to x-ray.  Patietn was escorted out of room by x-ray techs, after completing her breakfast.   Will f/u with discharge planning during afternoon OT session planned.    Therapy Documentation Precautions:  Precautions Precautions: Back;Fall Precaution Booklet Issued: No Precaution Comments: Back precautions reviewed (BAT) Restrictions Weight Bearing Restrictions: No  General: General Amount of Missed OT Time (min): 45 Minutes due to x-ray scheduled  Vital Signs: Therapy Vitals BP: 130/80 mmHg  Pain: No/denies pain   ADL: ADL ADL Comments: see FIM  See FIM for current functional status  Therapy/Group: Individual Therapy  Second session: Time: 6734-1937 Time Calculation (min):  30 min  Pain Assessment: No/denies pain  Skilled Therapeutic Interventions: Therapeutic activity with focus on stress reduction, weight-shifting and positioning in w/c, home safety, and symptom management.   Patient received in w/c requesting to remain in w/c rather than transfer back to bed.   Patient reported discomfort at buttocks but had been ineffective at both  weight-shifting and problem-solving.   OT provided max assist to reposition patient and re-educated patient on pressure relief and management of w/c backrest and cushions.   Patient then requested clarification on discharge status and duration of ABX treatment after her brief discussion with pulmonary specialist.   Patient again expressed her desire to return home.   OT reiterated plan to continue ABX treatments as advised clarifying that leaving prior to receiving treatment would be against medical advice from the entire rehab department treatment team.   OT attempted distracting patient from continued ruminations over discharge with focus on discharge planning, use of AE, strengthening and continued home prep.   Patient verbalized understanding.   See FIM for current functional status  Therapy/Group: Individual Therapy  Salome Spotted 12/06/2013, 7:49 AM

## 2013-12-06 NOTE — Consult Note (Signed)
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  Now on CIR with worsening SOB and hypoxia.    SIGNIFICANT EVENTS / STUDIES:  3/21 OR for T8 epidural mass resection  LINES / TUBES:   CULTURES: Sputum 3/25>>> normal flora  Urine 4/4>>> pseudomonas R to zosyn, S to cefepime/ceftaz, enterococcus R to ampi  ANTIBIOTICS: Acyclovir  4/13 vanc>> 4/13 zoysn>>   VITAL SIGNS: Temp:  [98 F (36.7 C)-98.3 F (36.8 C)] 98 F (36.7 C) (04/14 0300) Pulse Rate:  [90-125] 90 (04/14 0300) Resp:  [20-24] 20 (04/14 0300) BP: (123-130)/(80-82) 130/80 mmHg (04/14 0745) SpO2:  [92 %-94 %] 94 % (04/14 0300) Weight:  [71.9 kg (158 lb 8.2 oz)] 71.9 kg (158 lb 8.2 oz) (04/14 0300)   PHYSICAL EXAMINATION: General:  Chronically ill appearing female, appears much older than stated age, NAD in wheelchair , tremulous.  Neuro:  Awake, alert, appropriate, MAE, BLE weakness , tremulous HEENT:  Mm dry, no JVD Cardiovascular:  s1s2 rrr Lungs:  resps even, non labored on [place], prolonged expiration, diminished bases L>R,  Abdomen:  Soft, +bs Musculoskeletal:  Warm and dry, scant BLE edema, TED hose , lower ext weakness   Recent Labs Lab 11/30/13 0625 12/05/13 1200 12/06/13 0713  NA 143 137 138  K 4.6 5.6* 4.1  CL 105 99 98  CO2 24 20 26   BUN 37* 43* 36*  CREATININE 0.97 0.78 0.77  GLUCOSE 189* 403* 199*   No results found for this basename: HGB, HCT, WBC, PLT,  in the last 168 hours Dg Chest 2 View  12/05/2013   CLINICAL DATA:  Cough with hypoxia  EXAM: CHEST   2 VIEW  COMPARISON:  November 21, 2013  FINDINGS: There is increased opacity in the left base compared to recent prior study. This finding is felt to represent progression of pneumonia. There is patchy atelectasis in the right base which is stable. Heart is prominent with normal pulmonary vascularity, stable. There appears to be some underlying emphysematous change. Compression fractures again noted in the thoracic spine.  IMPRESSION: Increase in left base infiltrate. Otherwise no change from recent prior study.   Electronically Signed   By: Lowella Grip M.D.   On: 12/05/2013 10:40   Ct Chest Wo Contrast  12/05/2013   CLINICAL DATA:  Nonproductive cough, malaise and shortness of breath. History of left lower lobe pneumonia.  EXAM: CT CHEST WITHOUT CONTRAST  TECHNIQUE: Multidetector CT imaging of the chest was performed following the standard protocol without IV contrast.  COMPARISON:  09/19/2013 and 07/24/2013. Prior chest radiographs dating back to 09/19/2013.  FINDINGS: No pathologically enlarged mediastinal lymph nodes. Hilar regions are difficult to definitively evaluate without IV contrast. No axillary adenopathy. Pulmonary arteries are enlarged. Coronary artery calcification. Heart is at the upper limits of normal in size. No pericardial effusion.  Fairly severe centrilobular emphysema. Lingular and left lower lobe airspace consolidation. Scarring in the right middle lobe. No pleural fluid. Airway is unremarkable.  Incidental imaging of the upper abdomen shows the visualized portions of the liver, adrenal glands,  spleen and stomach to be grossly unremarkable. Low-attenuation lesions in the upper pole right kidney are incompletely imaged. No upper abdominal adenopathy. No worrisome lytic or sclerotic lesions. T7 and T8 compression fractures are stable. Slight increase in superior endplate compression involving T9.  IMPRESSION: 1. Patchy airspace disease in the lingula and left lower lobe is indicative of  pneumonia. Continued follow-up to clearing is recommended to exclude underlying malignancy. 2. Three-vessel coronary artery calcification. 3. Pulmonary arterial enlargement, indicative of pulmonary arterial hypertension. 4. Thoracic compression fractures. Superior endplate compression involving T9 appears mildly progressive.   Electronically Signed   By: Lorin Picket M.D.   On: 12/05/2013 15:20    ASSESSMENT / PLAN:  Hypoxemia  COPD Home Oxygen Dependent - 4L baseline(continued to smoke for 10 years on O2) HCAP - recent HCAP with Burkholderia cepacia (levofloxacin sens) Feels better 4/14 with abx, pul toilet. REC -  Change BD to duonebs  abx for HCAP  On decadron per nsgy - should d/w them if taper to off  Supplemental O2  Aggressive pulm hygiene - add flutter  Will treat for standard organisms with vanc/zosyn, but must be weary of the B. Cepacia -Get ID input if discharge planned Swallow eval with recurrent PNA  Await sputum culture if able to expectorate  Mucolytic   Enterococcal/pseudomonas UTI - treated   Thoracic epidural mass - (fibroadipose tumor) with incomplete paraplegia, s/p resection Neurogenic bowel/ bladder   Pt/ot  Per nsgy, CIR     Global: Feels better with Abx and pulmonary toilet. Suggest ID input for duration and narrowing of abx.   Rigoberto Noel MD [phone]  12/06/2013, 8:35 AM

## 2013-12-06 NOTE — Progress Notes (Signed)
Physical Therapy Discharge Summary  Patient Details  Name: [name] MRN: [id number] Date of Birth: June 22, 1961   Patient has met 5 of 7 long term goals due to improved activity tolerance, improved balance, improved postural control, increased strength and ability to compensate for deficits.  Patient to discharge at a wheelchair level [place].   Patient's care partner is independent to provide the necessary physical assistance at discharge.  Reasons goals not met: pt requires mod A for transfers  Recommendation:  Patient will benefit from ongoing skilled PT services in home health setting to continue to advance safe functional mobility, address ongoing impairments in balance, transfers, strength, mobility, and minimize fall risk.  Equipment: w/c, hospital bed, sliding board  Reasons for discharge: treatment goals met and discharge from hospital  Patient/family agrees with progress made and goals achieved: Yes  PT Discharge Precautions/Restrictions Precautions Precautions: Back;Fall Restrictions Weight Bearing Restrictions: No  Cognition Overall Cognitive Status: Within Functional Limits for tasks assessed Orientation Level: Oriented X4 Sensation Sensation Light Touch Impaired Details: Impaired RLE;Impaired LLE Proprioception: Impaired Detail Proprioception Impaired Details: Impaired LLE;Impaired RLE Coordination Gross Motor Movements are Fluid and Coordinated: No Coordination and Movement Description: Impaired coordination of bil LEs during functional acitvity, pt relies on vision heavilyfor being able to determine what her legs are doing.  Motor  Motor Motor: Paraplegia;Abnormal postural alignment and control Motor - Skilled Clinical Observations: Lt LE more impaired (strength/sensation) than Rt LE.    Trunk/Postural Assessment  Thoracic Assessment Thoracic Assessment: Exceptions to [facility] (kyphosis) Lumbar Assessment Lumbar Assessment: Exceptions to [facility] (posterior  tilt) Postural Control Postural Limitations: impaired trunk control  Balance Static Sitting Balance Static Sitting - Balance Support: Right upper extremity supported Static Sitting - Level of Assistance: 5: Stand by assistance Extremity Assessment      RLE Strength Right Hip Flexion: 2/5 Right Knee Flexion: 3-/5 Right Knee Extension: 3-/5 Right Ankle Dorsiflexion: 2-/5 Right Ankle Inversion: 2/5 Right Ankle Eversion: 2/5 LLE Strength Left Hip Flexion: 1/5 Left Knee Flexion: 2-/5 Left Knee Extension: 2/5 Left Ankle Dorsiflexion: 1/5 Left Ankle Inversion: 2-/5 Left Ankle Eversion: 2-/5  See FIM for current functional status  Kennith Gain 12/06/2013, 10:50 AM

## 2013-12-06 NOTE — Evaluation (Signed)
Speech Language Pathology Assessment and Plan  Patient Details  Name: [name] MRN: [id number] Date of Birth: [birth year]  SLP Diagnosis: Dysphagia  Rehab Potential: Good ELOS: 5-7 days   Today's Date: 12/06/2013 Time: 0915-1000 Time Calculation (min): 45 min  Problem List:  Patient Active Problem List   Diagnosis Date Noted  . Thoracic spine tumor 11/16/2013  . Urinary retention 11/12/2013  . Left leg weakness 11/12/2013  . 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 Dyspnea 10/18/2013  . Right upper lobe pneumonia/infiltrate 09/20/2013  . Lichen planus vulva 42/70/6237  . Thoracic compression fracture 09/20/2013  . COPD exacerbation 09/19/2013  . Acute delirium 09/07/2013  . Chronic diastolic congestive heart failure, NYHA class 1 09/07/2013  . Hypernatremia 09/06/2013  . Shingles 09/06/2013  . History of depression, chronic antidepressants 09/06/2013  . Physical deconditioning 09/06/2013  . Hypercarbia, acute (resolved) on chronic 09/02/2013  . Acute-on-chronic respiratory failure 09/02/2013  . 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 HYPERTRIGLYCERIDEMIA 09/16/2006  . History of anxiety - chronic alprazolam 06/10/2006  . TOBACCO ABUSE 06/10/2006  . COPD without acute bronchospasm 06/10/2006  . PULMONARY NODULE 06/10/2006  . GERD, chronic PPI use 06/10/2006  . LOW BACK PAIN 06/10/2006  . FIBROMYALGIA 06/10/2006  . SLEEP APNEA 06/10/2006  . CERVICAL CANCER, HX OF 06/10/2006  . HYSTERECTOMY, HX OF 06/10/2006   Past Medical History:  Past Medical History  Diagnosis Date  . Right-sided heart failure   . COPD (chronic obstructive pulmonary disease)   . Emphysema   . Seizures   . Thyroid disease   . Depression   . Emphysema   . Arthritis     back and legs  . Blood transfusion without reported diagnosis   . High cholesterol   . Anxiety   . Nerves   . Panic attack   . Thyroid disorder   . Hypothyroidism   . Pneumonia    . CHF (congestive heart failure)   . On home O2     4L N/C chronic  . Fibromyalgia    Past Surgical History:  Past Surgical History  Procedure Laterality Date  . Abdominal hysterectomy    . Cesarean section    . Cholecystectomy    . Laser removal condyloma    . Laminectomy N/A 11/12/2013    Procedure: THORACIC LAMINECTOMY FOR MASS;  Surgeon: Eustace Moore, MD;  Location: [facility];  Service: Neurosurgery;  Laterality: N/A;    Assessment / Plan / Recommendation Clinical Impression Pt is a 53 y.o. female with h/o severe COPD--oxygen dependent, FMS, multiple episodes of PNA this winter, chronic thoracic pain with compression fracture who was admitted 11/11/13 with complaints of urinary retention as well as progressive weakness in BLE with inability to walk. Pt reported epidural injection approx 9 days PTA with onset of LE numbness. MRI T-spine showed "Abnormal structures within the spinal canal at the T8 level, larger on the left than on the right, which appear to compress the spinal cord". She was taken to OR 3/21 for T8 thoracic epidural mass resection. Extubated 3/22 and therapy initiated. Pt continues with paraparesis and requires verbal as well as tactile cues for mobility. CIR recommended by rehab team and patient admitted 3/25. SLP bedside swallow evaluation revealed increased SOB during PO intake and questionable increase in audible congestion after intake. Pt with multiple admissions this winter with PNA as well as decreased respiratory support and h/o dysphagia. Recommend MBS prior to d/c to assess  for current aspiration risk.    Skilled Therapeutic Interventions          SLP bedside swallow evaluation was administered with results and recommendations reviewed with the patient.  SLP Assessment  Patient will need skilled Speech Lanaguage Pathology Services during CIR admission    Recommendations  Recommended Consults: MBS Diet Recommendations: Regular;Thin liquid Liquid  Administration via: Cup Medication Administration: Whole meds with liquid Supervision: Patient able to self feed Compensations: Slow rate;Small sips/bites Postural Changes and/or Swallow Maneuvers: Seated upright 90 degrees Oral Care Recommendations: Oral care BID Patient destination: Home Follow up Recommendations: Other (comment) (TBD pending objective testing) Equipment Recommended: None recommended by SLP    SLP Frequency 5 out of 7 days   SLP Treatment/Interventions Dysphagia/aspiration precaution training;Patient/family education    Pain Pain Assessment Pain Assessment: 0-10 Pain Score: 3  Prior Functioning    Short Term Goals: Week 1: SLP Short Term Goal 1 (Week 1): Pt will participate in objective swallowing evaluation to assess current aspiration risk  See FIM for current functional status Refer to Care Plan for Long Term Goals  Recommendations for other services: None  Discharge Criteria: Patient will be discharged from SLP if patient refuses treatment 3 consecutive times without medical reason, if treatment goals not met, if there is a change in medical status, if patient makes no progress towards goals or if patient is discharged from hospital.  The above assessment, treatment plan, treatment alternatives and goals were discussed and mutually agreed upon: by patient   Germain Osgood, M.A. CCC-SLP [phone]  Germain Osgood 12/06/2013, 12:52 PM

## 2013-12-07 ENCOUNTER — Inpatient Hospital Stay (HOSPITAL_COMMUNITY): Payer: Medicare Other | Admitting: Physical Therapy

## 2013-12-07 ENCOUNTER — Encounter (HOSPITAL_COMMUNITY): Payer: Medicare Other

## 2013-12-07 ENCOUNTER — Inpatient Hospital Stay (HOSPITAL_COMMUNITY): Payer: Medicare Other

## 2013-12-07 DIAGNOSIS — F341 Dysthymic disorder: Secondary | ICD-10-CM

## 2013-12-07 DIAGNOSIS — J449 Chronic obstructive pulmonary disease, unspecified: Secondary | ICD-10-CM

## 2013-12-07 DIAGNOSIS — N189 Chronic kidney disease, unspecified: Secondary | ICD-10-CM

## 2013-12-07 DIAGNOSIS — IMO0001 Reserved for inherently not codable concepts without codable children: Secondary | ICD-10-CM

## 2013-12-07 DIAGNOSIS — K560 Paralytic ileus: Secondary | ICD-10-CM

## 2013-12-07 DIAGNOSIS — D497 Neoplasm of unspecified behavior of endocrine glands and other parts of nervous system: Secondary | ICD-10-CM

## 2013-12-07 LAB — HEPATITIS PANEL, ACUTE
HCV AB: NEGATIVE
HEP A IGM: NONREACTIVE
HEP B S AG: NEGATIVE
Hep B C IgM: NONREACTIVE

## 2013-12-07 LAB — EXPECTORATED SPUTUM ASSESSMENT W GRAM STAIN, RFLX TO RESP C: Special Requests: NORMAL

## 2013-12-07 LAB — HIV ANTIBODY (ROUTINE TESTING W REFLEX): HIV 1&2 Ab, 4th Generation: NONREACTIVE

## 2013-12-07 NOTE — Procedures (Addendum)
Objective Swallowing Evaluation: Modified Barium Swallowing Study  Patient Details  Name: [name] MRN: [id number] Date of Birth: 05/18/1961  Today's Date: 12/07/2013 Time: 0910-0940 Time Calculation (min): 30 min  Past Medical History:  Past Medical History  Diagnosis Date  . Right-sided heart failure   . COPD (chronic obstructive pulmonary disease)   . Emphysema   . Seizures   . Thyroid disease   . Depression   . Emphysema   . Arthritis     back and legs  . Blood transfusion without reported diagnosis   . High cholesterol   . Anxiety   . Nerves   . Panic attack   . Thyroid disorder   . Hypothyroidism   . Pneumonia   . CHF (congestive heart failure)   . On home O2     4L N/C chronic  . Fibromyalgia    Past Surgical History:  Past Surgical History  Procedure Laterality Date  . Abdominal hysterectomy    . Cesarean section    . Cholecystectomy    . Laser removal condyloma    . Laminectomy N/A 11/12/2013    Procedure: THORACIC LAMINECTOMY FOR MASS;  Surgeon: Eustace Moore, MD;  Location: [facility];  Service: Neurosurgery;  Laterality: N/A;   HPI:  Pt is a 53 y.o. female with h/o severe COPD--oxygen dependent, FMS, multiple episodes of PNA this winter, chronic thoracic pain with compression fracture who was admitted 11/11/13 with complaints of urinary retention as well as progressive weakness in BLE with inability to walk. Pt reported epidural injection approx 9 days PTA with onset of LE numbness. MRI T-spine showed "Abnormal structures within the spinal canal at the T8 level, larger on the left than on the right, which appear to compress the spinal cord". She was taken to OR 3/21 for T8 thoracic epidural mass resection. Extubated 3/22 and therapy initiated. Pt continues with paraparesis and requires verbal as well as tactile cues for mobility. CIR recommended by rehab team and patient admitted 3/25. SLP bedside swallow evaluation revealed increased SOB during PO  intake and questionable increase in audible congestion after intake. Pt with multiple admissions this winter with PNA as well as decreased respiratory support and h/o dysphagia. Recommend MBS prior to d/c to assess for current aspiration risk.      Recommendation/Prognosis  Clinical Impression:   Dysphagia Diagnosis: Mild oral phase dysphagia;Moderate pharyngeal phase dysphagia Clinical impression: Pt presents with a mild to moderate oral and oropharyngeal dysphagia appearing to result from increased respiratory need and reduced timing of airway closure for adequate apneic period during the swallow. The pt has adequate strength, but timing of swallow and sensation are impaired.   Oral function is adequate, except when cued to orally contain bolus and coordinate breathing or for supraglottic swallow; pt consistently continues to spill to airway. Swallow initiation is consistently delayed with liquids falling into vestibule prior to the swallow, without response from pt. In 80% of trials pt keeps vocal folds closed, but in some instances trace amounts do pass below the cords and appear to consistently rise to level of cords. Pt can expel with a cue for a cough/throat clear. No strategies were effective in presenting penetration. There was high, flash penetration with nectar, no aspiration.   Per this study penetration/aspiration is trace in quantity, though it is suspected that pt may have increased aspiration risk with typical intake (increased bolus size, fatigue, pain meds etc). Pt made it very clear that she would not  be compliant with thickened liquids when d/c'd home. May be more beneficial to train pt in compensatory strategies to reduce risk with a regular diet and thin liquids to increase likelihood of compliance. Will suggest this diet to MD and treating therapist, but defer to them for long term plan.    Swallow Evaluation Recommendations:  Diet Recommendations: Regular;Thin liquid Liquid  Administration via: Cup;No straw Medication Administration: Whole meds with puree Supervision: Patient able to self feed;Intermittent supervision to cue for compensatory strategies Compensations: Slow rate;Small sips/bites;Clear throat after each swallow Postural Changes and/or Swallow Maneuvers: Seated upright 90 degrees Oral Care Recommendations: Oral care BID Follow up Recommendations: Other (comment) (TBD by treating therapist)    Prognosis:  Prognosis for Safe Diet Advancement: Fair   Individuals Consulted: Consulted and Agree with Results and Recommendations: Patient      SLP Assessment/Plan  Plan:  Speech Therapy Frequency: min 3x week   Short Term Goals: Week 1: SLP Short Term Goal 1 (Week 1): Pt will participate in objective swallowing evaluation to assess current aspiration risk SLP Short Term Goal 1 - Progress (Week 1): Met SLP Short Term Goal 2 (Week 1): Pt will utilize compensatory strategies/precautions for safe swallowing with 80% accuracy and modified independence SLP Short Term Goal 2 - Progress (Week 1): Progressing toward goal    General: Type of Study: Modified Barium Swallowing Study Reason for Referral: Objectively evaluate swallowing function Previous Swallow Assessment: previous MBS in 2012, pt reports that she had difficulty at that time while she was sick but that she has been eating/drinking whatever consistencies she wanted  PTA Diet Prior to this Study: Regular;Thin liquids Temperature Spikes Noted: No Respiratory Status: Nasal cannula History of Recent Intubation: No Behavior/Cognition: Alert;Cooperative;Pleasant mood Oral Cavity - Dentition: Adequate natural dentition Oral Motor / Sensory Function: Within functional limits Self-Feeding Abilities: Able to feed self Patient Positioning: Upright in chair Baseline Vocal Quality: Clear Volitional Cough: Congested;Strong Volitional Swallow: Able to elicit Anatomy: Within functional limits Pharyngeal  Secretions: Not observed secondary MBS   Reason for Referral:   Objectively evaluate swallowing function    Oral Phase: Oral Preparation/Oral Phase Oral Phase: Impaired Oral - Nectar Oral - Nectar Cup: Other (Comment) (unable to orally contain bolus, passive, uncontroll spillage) Oral - Thin Oral - Thin Cup:  (struggles to orally contain bolus with cues) Oral - Solids Oral - Puree: Within functional limits Oral - Mechanical Soft: Within functional limits   Pharyngeal Phase:  Pharyngeal Phase Pharyngeal Phase: Impaired Pharyngeal - Nectar Pharyngeal - Nectar Cup: Delayed swallow initiation;Reduced epiglottic inversion;Penetration/Aspiration before swallow Penetration/Aspiration details (nectar cup): Material enters airway, remains ABOVE vocal cords then ejected out;Material does not enter airway Pharyngeal - Thin Pharyngeal - Thin Cup: Delayed swallow initiation;Reduced epiglottic inversion;Reduced airway/laryngeal closure;Penetration/Aspiration before swallow;Trace aspiration Penetration/Aspiration details (thin cup): Material enters airway, remains ABOVE vocal cords then ejected out;Material enters airway, CONTACTS cords then ejected out;Material enters airway, remains ABOVE vocal cords and not ejected out;Material enters airway, CONTACTS cords and not ejected out;Material enters airway, passes BELOW cords then ejected out Pharyngeal - Solids Pharyngeal - Puree: Delayed swallow initiation Pharyngeal - Regular: Delayed swallow initiation Pharyngeal - Pill: Delayed swallow initiation (given whole in puree)   Cervical Esophageal Phase      GN        Herbie Baltimore, MA CCC-SLP [phone]  [name] 12/07/2013, 10:31 AM

## 2013-12-07 NOTE — Progress Notes (Signed)
Speech Language Pathology Note  Patient Details  Name: [name] MRN: [id number] Date of Birth: 09/21/1960 Today's Date: 12/07/2013  SLP spoke with patient this afternoon to review MBS results and recommendation from this SLP for nectar thick liquids during meals along with the water protocol, as MBS report indicates consistent silent penetration/aspiration and pt has had multiple admissions this winter for PNA. Pt became quickly upset, adamantly refusing to consider thickened liquids prior to hearing all education. SLP encouraged patient to consider the information, and offered to return when husband was present.  SLP returned for discussion with patient and husband regarding her known high risk of aspiration, recurrent PNA, and recommendations to reduce risk. Pt continued to adamantly decline thickened liquids, becoming tearful and emotional, talking about her "time to die." RN present and aware, and stated that she would seek support for patient.  At this time, patient wishes to remain on thin liquids accepting the risk of aspiration. Pt is cleared by MD to make her own decisions; however, SLP questions patient's ability to make an educated decision due to decreased willingness to accept education. Pt's husband reported outside of his room that he wishes for his wife to be on thickened liquids; encouraged him to discuss this further with his wife, who is making her own decisions.  At this time, SLP will continue to offer education to patient and her husband, regarding aspiration risks as well as safe swallowing strategies to reduce her risk as much as possible.    Germain Osgood, M.A. CCC-SLP [phone]  Germain Osgood 12/07/2013, 4:16 PM

## 2013-12-07 NOTE — Progress Notes (Signed)
Occupational Therapy Session Note  Patient Details  Name: [name] MRN: [id number] Date of Birth: Dec 01, 1960  Today's Date: 12/07/2013 Time: 0730-0830 Time Calculation (min): 60 min  Short Term Goals: Week 3:  OT Short Term Goal 1 (Week 3): STG=LTG due to anticipated discharge to home within 7 days  Skilled Therapeutic Interventions/Progress Updates: ADL-retraining with focus on effective use of AE (leg lifter), re-ed on use of flutter valve and alternate method for improving productive cough (splinting diaghram) durign ADL, bed mobility and review of ongoing home mods.    Patient received supine in bed, receptive for ADL.   Patient remains setup assist for upper body bathing and dressing although with min assist to problem-solve during rolls (right and left) as she fails to perceive limitations from obstacles like pillows, blankets and towels and restricting her mobility.   Patient requires mod-max assist for lower body dressing due to inability to incorporate use of AE with limited motivation for independence.    Patient reports confidence in caregiver assist for lower body bathing and has resigned to accept bed baths as most feasible at home.   OT re-educated patient benefit of leg lifter to facilitate knee/hip flexion.     Therapy Documentation Precautions:  Precautions Precautions: Back;Fall Precaution Booklet Issued: No Precaution Comments: Back precautions reviewed (BAT) Restrictions Weight Bearing Restrictions: No Vital Signs: Therapy Vitals Temp: 98.2 F (36.8 C) Temp src: Oral Pulse Rate: 96 Resp: 16 BP: 137/91 mmHg Oxygen Therapy SpO2: 92 % O2 Device: Nasal cannula O2 Flow Rate (L/min): 4 L/min Pain: Pain Assessment Pain Assessment: 0-10 Pain Score: 3  Pain Type: Chronic pain Pain Location: Generalized Patients Stated Pain Goal: 2 Pain Intervention(s): Repositioned ADL: ADL ADL Comments: see FIM  See FIM for current functional status  Therapy/Group:  Individual Therapy  Second session: Time: 6967-8938 Time Calculation (min):  45 min  Pain Assessment: No/denies pain  Skilled Therapeutic Interventions: ADL-retraining with focus on family education with patient's husband on slide board transfer, facilitation of trunk rotation, assist with bed positioning and problem-solving during ADL, and discharge planning.   Patient received in her w/c with husband attending to her while she ate noon meal.   Husband reports that ramp has been completed; w/c and hospital bed also received to date.  OT then challenged husband to assist patient with slide board transfer back to bed (uphill) and to observed facilitation technique used to improve trunk rotation during bed mobility (typical ADL session).   Ron confirmed that he would assist as needed and encourage highest level of  independence with performance of assisted ADL, supine and sitting.   Patient required max assist to complete slide board transfer back to bed and min assist out.   Husband was careful and attentive to position of patient's feet and with management of oxygen lines but required additional assist with managing IV and re-ed on pulling patient along slide board with manual contact at hips versus lifting her under her arms.  See FIM for current functional status  Therapy/Group: Individual Therapy  Salome Spotted 12/07/2013, 8:40 AM

## 2013-12-07 NOTE — Progress Notes (Signed)
Physical Therapy Note  Patient Details  Name: [name] MRN: [id number] Date of Birth: Nov 22, 1960 Today's Date: 12/07/2013  Pt refused skilled PT sessions x 2 today due to saying "I"m going to go home today, I'm not staying any longer".  PT offered encouragement to participate and maintain mobility while she is here, pt reports she is too upset and anxious to participate in skilled PT sessions today.  PT to attempt treatment tomorrow as appropriate.   Danae Orleans Danny Zimny 12/07/2013, 1:48 PM

## 2013-12-07 NOTE — Progress Notes (Signed)
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  Now on CIR with worsening SOB and hypoxia.    SIGNIFICANT EVENTS / STUDIES:  3/21 OR for T8 epidural mass resection  LINES / TUBES:   CULTURES: Sputum 3/25>>> normal flora  Urine 4/4>>> pseudomonas R to zosyn, S to cefepime/ceftaz, enterococcus R to ampi  ANTIBIOTICS: Acyclovir  4/13 vanc>> 4/13 zoysn>>   VITAL SIGNS: Temp:  [97.7 F (36.5 C)-98.2 F (36.8 C)] 98.2 F (36.8 C) (04/15 0526) Pulse Rate:  [96-97] 96 (04/15 0526) Resp:  [16] 16 (04/15 0526) BP: (111-137)/(74-91) 137/91 mmHg (04/15 0526) SpO2:  [90 %] 90 % (04/15 0526) Weight:  [70.6 kg (155 lb 10.3 oz)] 70.6 kg (155 lb 10.3 oz) (04/15 0526)   PHYSICAL EXAMINATION: General:  Chronically ill appearing female, appears much older than stated age, NAD in bed , tremulous. Wants to go home. Neuro:  Awake, alert, appropriate, MAE, BLE weakness , tremulous HEENT:  Mm dry, no JVD Cardiovascular:  s1s2 rrr Lungs:  resps even, non labored on [place], prolonged expiration, diminished bases L>R,  Abdomen:  Soft, +bs Musculoskeletal:  Warm and dry, scant BLE edema, TED hose , lower ext weakness/contractures   Recent Labs Lab 12/05/13 1200 12/06/13 0713  NA 137 138  K 5.6* 4.1  CL 99 98  CO2 20 26  BUN 43* 36*  CREATININE 0.78 0.77  GLUCOSE 403* 199*   No results found for this basename: HGB, HCT, WBC, PLT,  in the last 168 hours Dg Chest 2 View  12/06/2013   CLINICAL DATA:  COPD.  Shortness of breath.  Followup of pneumonia.   EXAM: CHEST  2 VIEW  COMPARISON:  CT CHEST W/O CM dated 12/05/2013; DG CHEST 2 VIEW dated 12/05/2013  FINDINGS: Hyperinflation. Osteopenia. Thoracic compression deformity which is better evaluated on yesterday's CT.  Midline trachea. Mild cardiomegaly. No pleural effusion or pneumothorax. Chin overlies the apices. Right base volume loss. Slight improvement in left base airspace disease. No congestive failure.  IMPRESSION: Slight improvement in left base airspace disease/ pneumonia.  COPD with right base atelectasis and volume loss.   Electronically Signed   By: Abigail Miyamoto M.D.   On: 12/06/2013 08:34   Dg Chest 2 View  12/05/2013   CLINICAL DATA:  Cough with hypoxia  EXAM: CHEST  2 VIEW  COMPARISON:  November 21, 2013  FINDINGS: There is increased opacity in the left base compared to recent prior study. This finding is felt to represent progression of pneumonia. There is patchy atelectasis in the right base which is stable. Heart is prominent with normal pulmonary vascularity, stable. There appears to be some underlying emphysematous change. Compression fractures again noted in the thoracic spine.  IMPRESSION: Increase in left base infiltrate. Otherwise no change from recent prior study.   Electronically Signed   By: Lowella Grip M.D.   On: 12/05/2013 10:40   Ct Chest Wo Contrast  12/05/2013   CLINICAL DATA:  Nonproductive cough, malaise and shortness of breath. History of left lower lobe pneumonia.  EXAM: CT CHEST  WITHOUT CONTRAST  TECHNIQUE: Multidetector CT imaging of the chest was performed following the standard protocol without IV contrast.  COMPARISON:  09/19/2013 and 07/24/2013. Prior chest radiographs dating back to 09/19/2013.  FINDINGS: No pathologically enlarged mediastinal lymph nodes. Hilar regions are difficult to definitively evaluate without IV contrast. No axillary adenopathy. Pulmonary arteries are enlarged. Coronary artery calcification. Heart is at the upper limits of normal in size. No  pericardial effusion.  Fairly severe centrilobular emphysema. Lingular and left lower lobe airspace consolidation. Scarring in the right middle lobe. No pleural fluid. Airway is unremarkable.  Incidental imaging of the upper abdomen shows the visualized portions of the liver, adrenal glands, spleen and stomach to be grossly unremarkable. Low-attenuation lesions in the upper pole right kidney are incompletely imaged. No upper abdominal adenopathy. No worrisome lytic or sclerotic lesions. T7 and T8 compression fractures are stable. Slight increase in superior endplate compression involving T9.  IMPRESSION: 1. Patchy airspace disease in the lingula and left lower lobe is indicative of pneumonia. Continued follow-up to clearing is recommended to exclude underlying malignancy. 2. Three-vessel coronary artery calcification. 3. Pulmonary arterial enlargement, indicative of pulmonary arterial hypertension. 4. Thoracic compression fractures. Superior endplate compression involving T9 appears mildly progressive.   Electronically Signed   By: Lorin Picket M.D.   On: 12/05/2013 15:20    ASSESSMENT / PLAN:  Hypoxemia  COPD Home Oxygen Dependent - 4L baseline(continued to smoke for 10 years on O2) HCAP - recent HCAP with Burkholderia cepacia (levofloxacin sens) Feels better 4/14 /15 with abx, pul toilet. 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 Wants to go home.  Richardson Landry Minor ACNP Maryanna Shape  PCCM Pager [phone] till 3 pm If no answer page [phone]  D/w Dr Tessa Lerner  PCCM to sign off  Rigoberto Noel MD 12/07/2013, 8:19 AM

## 2013-12-07 NOTE — Progress Notes (Signed)
PHYSICAL MEDICINE & REHABILITATION     PROGRESS NOTE    Subjective/Complaints: Upset that she can't go home. "I feel better" ---wants to know what would happen if she signs out against our advise A 12 point review of systems has been performed and if not noted above is otherwise negative.   Objective: Vital Signs: Blood pressure 137/91, pulse 96, temperature 98.2 F (36.8 C), temperature source Oral, resp. rate 16, height 5\' 2"  (1.575 m), weight 70.6 kg (155 lb 10.3 oz), SpO2 92.00%. Dg Chest 2 View  12/06/2013   CLINICAL DATA:  COPD.  Shortness of breath.  Followup of pneumonia.  EXAM: CHEST  2 VIEW  COMPARISON:  CT CHEST W/O CM dated 12/05/2013; DG CHEST 2 VIEW dated 12/05/2013  FINDINGS: Hyperinflation. Osteopenia. Thoracic compression deformity which is better evaluated on yesterday's CT.  Midline trachea. Mild cardiomegaly. No pleural effusion or pneumothorax. Chin overlies the apices. Right base volume loss. Slight improvement in left base airspace disease. No congestive failure.  IMPRESSION: Slight improvement in left base airspace disease/ pneumonia.  COPD with right base atelectasis and volume loss.   Electronically Signed   By: Abigail Miyamoto M.D.   On: 12/06/2013 08:34   Dg Chest 2 View  12/05/2013   CLINICAL DATA:  Cough with hypoxia  EXAM: CHEST  2 VIEW  COMPARISON:  November 21, 2013  FINDINGS: There is increased opacity in the left base compared to recent prior study. This finding is felt to represent progression of pneumonia. There is patchy atelectasis in the right base which is stable. Heart is prominent with normal pulmonary vascularity, stable. There appears to be some underlying emphysematous change. Compression fractures again noted in the thoracic spine.  IMPRESSION: Increase in left base infiltrate. Otherwise no change from recent prior study.   Electronically Signed   By: Lowella Grip M.D.   On: 12/05/2013 10:40   Ct Chest Wo Contrast  12/05/2013   CLINICAL  DATA:  Nonproductive cough, malaise and shortness of breath. History of left lower lobe pneumonia.  EXAM: CT CHEST WITHOUT CONTRAST  TECHNIQUE: Multidetector CT imaging of the chest was performed following the standard protocol without IV contrast.  COMPARISON:  09/19/2013 and 07/24/2013. Prior chest radiographs dating back to 09/19/2013.  FINDINGS: No pathologically enlarged mediastinal lymph nodes. Hilar regions are difficult to definitively evaluate without IV contrast. No axillary adenopathy. Pulmonary arteries are enlarged. Coronary artery calcification. Heart is at the upper limits of normal in size. No pericardial effusion.  Fairly severe centrilobular emphysema. Lingular and left lower lobe airspace consolidation. Scarring in the right middle lobe. No pleural fluid. Airway is unremarkable.  Incidental imaging of the upper abdomen shows the visualized portions of the liver, adrenal glands, spleen and stomach to be grossly unremarkable. Low-attenuation lesions in the upper pole right kidney are incompletely imaged. No upper abdominal adenopathy. No worrisome lytic or sclerotic lesions. T7 and T8 compression fractures are stable. Slight increase in superior endplate compression involving T9.  IMPRESSION: 1. Patchy airspace disease in the lingula and left lower lobe is indicative of pneumonia. Continued follow-up to clearing is recommended to exclude underlying malignancy. 2. Three-vessel coronary artery calcification. 3. Pulmonary arterial enlargement, indicative of pulmonary arterial hypertension. 4. Thoracic compression fractures. Superior endplate compression involving T9 appears mildly progressive.   Electronically Signed   By: Lorin Picket M.D.   On: 12/05/2013 15:20   No results found for this basename: WBC, HGB, HCT, PLT,  in the last 72 hours  Recent Labs  12/05/13 1200 12/06/13 0713  NA 137 138  K 5.6* 4.1  CL 99 98  GLUCOSE 403* 199*  BUN 43* 36*  CREATININE 0.78 0.77  CALCIUM 8.9 8.5    CBG (last 3)  No results found for this basename: GLUCAP,  in the last 72 hours  Wt Readings from Last 3 Encounters:  12/07/13 70.6 kg (155 lb 10.3 oz)  11/14/13 73.755 kg (162 lb 9.6 oz)  11/14/13 73.755 kg (162 lb 9.6 oz)    Physical Exam:  Constitutional: She is oriented to person, place, and time. She appears well-developed and well-nourished.  HENT: dentition fair. Oral mucosa pink/moist  Head: Normocephalic and atraumatic.  Eyes: Conjunctivae are normal.  Cardiovascular: Normal rate and regular rhythm. No murmur  Respiratory: No respiratory distress. A few rhonchi still. Decreased air movement overall especially at bases persists GI: Soft. Bowel sounds are normal. She exhibits no distension. There is no tenderness.  Musculoskeletal: She exhibits no edema.  No calf tenderness Neurological: She is alert and oriented to person, place, and time.  Paraparesis with sensory deficits. DTR absent BLE. LLE tr to 1 to 1+/5 HF,KE. Tr at ankle. RLE is 1+hf, 2-ke, 3 ankle at best. Sensation 1/2 LLE 1+ RLE. T8 Sensory level. Tone LE minimal to none Skin: Skin is warm and dry.  Psychiatric: She has a normal mood and affect. Her behavior is normal.    Assessment/Plan: 1. 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 Physiatrist is providing close team supervision and 24 hour management of active medical problems listed below. Physiatrist and rehab team continue to assess barriers to discharge/monitor patient progress toward functional and medical goals.  Discussed with the patient the importance of rxing her pneumonia appropriately before discharge. She is upset that she's not going home when planned    FIM: FIM - Bathing Bathing Steps Patient Completed: Chest;Right Arm;Left Arm;Abdomen;Front perineal area Bathing: 3: Mod-Patient completes  5-7 60f 10 parts or 50-74%  FIM - Upper Body Dressing/Undressing Upper body dressing/undressing steps patient completed: Thread/unthread right sleeve of pullover shirt/dresss;Thread/unthread left sleeve of pullover shirt/dress;Put head through opening of pull over shirt/dress;Pull shirt over trunk Upper body dressing/undressing: 5: Set-up assist to: Obtain clothing/put away FIM - Lower Body Dressing/Undressing Lower body dressing/undressing steps patient completed: Thread/unthread right underwear leg;Thread/unthread right pants leg Lower body dressing/undressing: 1: Total-Patient completed less than 25% of tasks  FIM - Musician Devices: Grab bar or rail for support Toileting: 1: Total-Patient completed zero steps, helper did all 3  FIM - Radio producer Devices: Recruitment consultant Transfers: 0-Activity did not occur  FIM - Control and instrumentation engineer Devices: Sliding board;Arm rests;Bed rails;HOB elevated Bed/Chair Transfer: 3: Chair or W/C > Bed: Mod A (lift or lower assist);3: Bed > Chair or W/C: Mod A (lift or lower assist);3: Supine > Sit: Mod A (lifting assist/Pt. 50-74%/lift 2 legs;3: Sit > Supine: Mod A (lifting assist/Pt. 50-74%/lift 2 legs)  FIM - Locomotion: Wheelchair Distance: 150 Locomotion: Wheelchair: 5: Travels 150 ft or more: maneuvers on rugs and over door sills with supervision, cueing or coaxing FIM - Locomotion: Ambulation Locomotion: Ambulation Assistive Devices:  (unable to perform safely) Locomotion: Ambulation: 0: Activity did not occur  Comprehension Comprehension Mode: Auditory Comprehension: 5-Understands complex 90% of the time/Cues < 10% of the time  Expression Expression Mode: Verbal Expression: 5-Expresses basic  needs/ideas: With extra time/assistive device  Social Interaction Social Interaction: 6-Interacts appropriately with others with medication or extra time (anti-anxiety,  antidepressant).  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 DVT Prophylaxis/Anticoagulation: Mechanical: Antiembolism stockings, knee (TED hose) Bilateral lower extremities  Sequential compression devices, below knee Bilateral lower extremities  -dopplers negative.  2. Chronic pain due to fibromyalgia as well as back pain with neuropathy/ Pain Management: reduce oxycodone 5 mg qid  as well Lyrica 100 mg tid and cymbalta bid    -follow for activity tolerance 3. Depression with anxiety and panic attacks/Mood: Team to provide ego support. LCSW to follow for evaluation and support. Will continue cymbalta bid, xanax 0.5 mg tid as well as trazodone at bedtime for mood and insomnia.  4.Neuropsych: This patient is capable of making decisions on her own behalf.  5. Severe COPD: Continue oxygen 4 liters--used at home. Continue spiriva and dulera.    -albuterol changed to xopenex neb--  -scheduled mucinex  .  6. LLL pneumonia---appreciate CCM/ID help. On vanc and zosyn---need to refine abx regimen  -continue IS, FV    -tobacco abstinence  -remains at high risk for future problems given multiple issues above 6.Neurogenic Bowel and bladder :      -on linzess  -reduced narcotics,      -senna    7. Chronic renal failure: Baseline BUN/Cr- 39/1.80 at admission. Near baseline---continue current meds  -BUN/CR close to baseline   -labs pending 8. Diastolic dysfunction with tachycardia (as compensatory for hypoxia)   -lasix resumed at 40mg  daily 9. Surgical- decadron taper   10. Bladder: I and O cath prn/ voiding trial---will go home with a foley  -continue low dose flomax      LOS (Days) 21 A FACE TO FACE EVALUATION WAS PERFORMED  Meredith Staggers 12/07/2013 8:22 AM

## 2013-12-08 ENCOUNTER — Inpatient Hospital Stay (HOSPITAL_COMMUNITY): Payer: Medicare Other | Admitting: Physical Therapy

## 2013-12-08 ENCOUNTER — Inpatient Hospital Stay (HOSPITAL_COMMUNITY): Payer: Medicare Other

## 2013-12-08 ENCOUNTER — Inpatient Hospital Stay (HOSPITAL_COMMUNITY): Payer: Medicare Other | Admitting: *Deleted

## 2013-12-08 LAB — BASIC METABOLIC PANEL
BUN: 39 mg/dL — ABNORMAL HIGH (ref 6–23)
CALCIUM: 8.4 mg/dL (ref 8.4–10.5)
CHLORIDE: 94 meq/L — AB (ref 96–112)
CO2: 19 mEq/L (ref 19–32)
CREATININE: 0.96 mg/dL (ref 0.50–1.10)
GFR calc Af Amer: 78 mL/min — ABNORMAL LOW (ref >90)
GFR calc non Af Amer: 67 mL/min — ABNORMAL LOW (ref >90)
GLUCOSE: 403 mg/dL — AB (ref 70–99)
Potassium: 4.2 mEq/L (ref 3.7–5.3)
SODIUM: 135 meq/L — AB (ref 137–147)

## 2013-12-08 LAB — CBC
HEMATOCRIT: 40.3 % (ref 36.0–46.0)
Hemoglobin: 13.7 g/dL (ref 12.0–15.0)
MCH: 33.3 pg (ref 26.0–34.0)
MCHC: 34 g/dL (ref 30.0–36.0)
MCV: 98.1 fL (ref 78.0–100.0)
PLATELETS: 90 10*3/uL — AB (ref 150–400)
RBC: 4.11 MIL/uL (ref 3.87–5.11)
RDW: 15.4 % (ref 11.5–15.5)
WBC: 8.4 10*3/uL (ref 4.0–10.5)

## 2013-12-08 MED ORDER — LEVOFLOXACIN 750 MG PO TABS
750.0000 mg | ORAL_TABLET | Freq: Every day | ORAL | Status: DC
  Administered 2013-12-08 – 2013-12-09 (×2): 750 mg via ORAL
  Filled 2013-12-08 (×3): qty 1

## 2013-12-08 MED ORDER — RESOURCE THICKENUP CLEAR PO POWD
ORAL | Status: DC | PRN
  Filled 2013-12-08: qty 125

## 2013-12-08 NOTE — Progress Notes (Signed)
Physical Therapy Note  Patient Details  Name: [name] MRN: [id number] Date of Birth: May 14, 1961 Today's Date: 12/08/2013  Time: 1130-1145 15 minutes  1:1 No c/o pain.  Pt receiving breathing treatment during session.  Reviewed written seated and supine HEP with pt, pt performed seated AAROM for hip flexion, knee extension, ankle PF/DF and expressed understanding of written HEP.  Pt states she feels confident for d/c home tomorrow.   Kennith Gain 12/08/2013, 1:44 PM

## 2013-12-08 NOTE — Progress Notes (Signed)
Occupational Therapy Session Note  Patient Details  Name: DEBORAH LAZCANO MRN: [id number] Date of Birth: 01-31-1961  Today's Date: 12/08/2013 Time: 5035-4656 Time Calculation (min): 25 min  Short Term Goals: Week 3:  OT Short Term Goal 1 (Week 3): STG=LTG due to anticipated discharge to home within 7 days  Skilled Therapeutic Interventions/Progress Updates:    Pt seen for 1:1 OT session with focus on functional transfers, activity tolerance, and discharge planning. Pt received sitting in w/c and discussed discharge plans with home setup, etc. Pt reports feeling confident that her husband can provide the assistance required. Practiced sliding board transfer bed<>w/c with min-mod assist and min cues of reassurance. Emphasis on pt directing care, requiring min cues of reassurance for positioning of UB during transfers. Pt's SpO2>92% throughout session. At end of session pt left sitting in w/c with all needs in reach awaiting breathing treatment.   Therapy Documentation Precautions:  Precautions Precautions: Back;Fall Precaution Booklet Issued: No Precaution Comments: Back precautions reviewed (BAT) Restrictions Weight Bearing Restrictions: No General:   Vital Signs: Therapy Vitals Pulse Rate: 120 (After transfer) Patient Position, if appropriate: Sitting Oxygen Therapy SpO2: 92 % O2 Device: Nasal cannula O2 Flow Rate (L/min): 4 L/min Pulse Oximetry Type: Intermittent Pain: Pain Assessment Pain Assessment: 0-10 Pain Score: 5  Pain Location: Back Pain Intervention(s): Repositioned  See FIM for current functional status  Therapy/Group: Individual Therapy  Vanessia Bokhari N Sophea Rackham 12/08/2013, 11:27 AM

## 2013-12-08 NOTE — Progress Notes (Signed)
Speech Language Pathology Daily Session Note  Patient Details  Name: [name] MRN: [id number] Date of Birth: 03/16/1961  Today's Date: 12/08/2013 Time: 0900-0930 Time Calculation (min): 30 min  Short Term Goals: Week 1: SLP Short Term Goal 1 (Week 1): Pt will participate in objective swallowing evaluation to assess current aspiration risk SLP Short Term Goal 1 - Progress (Week 1): Met SLP Short Term Goal 2 (Week 1): Pt will utilize compensatory strategies/precautions for safe swallowing with 80% accuracy and modified independence SLP Short Term Goal 2 - Progress (Week 1): Progressing toward goal  Skilled Therapeutic Interventions: Skilled treatment focused on swallowing goals. SLP facilitated session with discussion regarding MBS results and pt preferences. Pt reported that after additional thought last night, she would like to try thickened liquids during her meals while she is in the hospital. SLP reviewed rationale for the water protocol between meals, however pt reported that she does not and will not drink water. SLP encouraged further consideration of nectar thick liquids during meals with the water protocol in between, both in the hospital and upon return home. At this time pt is choosing to consume nectar thick liquids at meals and thin liquids of any kind in between meals. Pt required supervision level verbal cueing to utilize safe swallowing strategies with thin liqudis. Will continue to make further educational attempts and to increase use of swallowing strategies.   FIM:  Comprehension Comprehension Mode: Auditory Comprehension: 5-Understands complex 90% of the time/Cues < 10% of the time Expression Expression Mode: Verbal Expression: 5-Expresses basic needs/ideas: With no assist Social Interaction Social Interaction: 6-Interacts appropriately with others with medication or extra time (anti-anxiety, antidepressant). Problem Solving Problem Solving: 5-Solves basic 90% of  the time/requires cueing < 10% of the time Memory Memory: 5-Recognizes or recalls 90% of the time/requires cueing < 10% of the time FIM - Eating Eating Activity: 5: Needs verbal cues/supervision  Pain Pain Assessment Pain Assessment: 0-10 Pain Score: 5  Pain Location: Back Pain Intervention(s): Repositioned  Therapy/Group: Individual Therapy   Germain Osgood, M.A. CCC-SLP [phone]  Germain Osgood 12/08/2013, 1:41 PM

## 2013-12-08 NOTE — Progress Notes (Signed)
Occupational Therapy Discharge Summary  Patient Details  Name: [name] MRN: [id number] Date of Birth: 10-23-1960  Today's Date: 12/10/2013  Patient has met 5 of 8 long term goals due to improved activity tolerance and ability to compensate for deficits.  Patient to discharge at overall Mod Assist level.  Patient's care partner is independent to provide the necessary physical and cognitive assistance at discharge.    Reasons goals not met:  Patient reported preference for use of bedpan instead of bedside commode, was unable to progress to standing tolerance, and would not incorporate use of AE for ADL as instructed.  Recommendation:  Patient will not required ongoing skilled OT services, outpatient or home health, due to stated preference to limit guests and resume prior routine.  Equipment: drop-arm bedside commode  Reasons for discharge: discharge from hospital  Patient/family agrees with progress made and goals achieved: Yes  OT Discharge Precautions/Restrictions  Restrictions Weight Bearing Restrictions: No  Vital Signs Therapy Vitals Temp: 98.6 F (37 C) Temp src: Oral Pulse Rate: 105 Resp: 20 BP: 136/90 mmHg Patient Position, if appropriate: Lying Oxygen Therapy SpO2: 92 % O2 Device: Nasal cannula O2 Flow Rate (L/min): 4 L/min  Pain Pain Assessment Pain Assessment: 0-10 Pain Score: 2   ADL ADL ADL Comments: see FIM  Vision/Perception  Vision- History Baseline Vision/History: Wears glasses Wears Glasses: Reading only Patient Visual Report: No change from baseline Vision- Assessment Vision Assessment?: No apparent visual deficits   Cognition Overall Cognitive Status: Within Functional Limits for tasks assessed Arousal/Alertness: Awake/alert Orientation Level: Oriented X4 Attention: Selective Sustained Attention: Appears intact Selective Attention: Appears intact Memory: Appears intact Awareness: Appears intact Awareness Impairment:  Anticipatory impairment Problem Solving: Impaired Problem Solving Impairment: Functional complex Executive Function: Self Monitoring Self Monitoring: Impaired Self Monitoring Impairment: Functional complex Behaviors: Poor frustration tolerance;Perseveration Safety/Judgment: Appears intact  Sensation Sensation Light Touch: Impaired Detail Light Touch Impaired Details: Impaired RLE;Impaired LLE Stereognosis: Appears Intact Hot/Cold: Appears Intact Proprioception: Impaired Detail Proprioception Impaired Details: Impaired LLE;Impaired RLE Coordination Gross Motor Movements are Fluid and Coordinated: No Fine Motor Movements are Fluid and Coordinated: Yes Coordination and Movement Description: Impaired coordination of bil LEs during functional acitvity, pt relies on vision heavilyfor being able to determine what her legs are doing.   Motor  Motor Motor: Paraplegia;Abnormal postural alignment and control Motor - Skilled Clinical Observations: Lt LE more impaired (strength/sensation) than Rt LE.   Mobility  Bed Mobility Bed Mobility: Rolling Left;Left Sidelying to Sit;Supine to Sit Rolling Left: 4: Min assist Rolling Left Details: Tactile cues for weight shifting Left Sidelying to Sit: 3: Mod assist;HOB elevated;With rails Left Sidelying to Sit Details: Manual facilitation for weight shifting   Trunk/Postural Assessment  Cervical Assessment Cervical Assessment: Exceptions to [facility] (chronic forward head lean) Thoracic Assessment Thoracic Assessment: Exceptions to [facility] (kyphosis) Lumbar Assessment Lumbar Assessment: Exceptions to [facility] (posterior tilt) Postural Control Postural Limitations: impaired trunk control   Balance Static Sitting Balance Static Sitting - Balance Support: Right upper extremity supported Static Sitting - Level of Assistance: 5: Stand by assistance Dynamic Sitting Balance Sitting balance - Comments: sat EOB for 10 min req UE support.  Static Standing  Balance Static Standing - Level of Assistance: Not tested (comment) (goal discontinued)  Extremity/Trunk Assessment RUE Assessment RUE Assessment: Within Functional Limits LUE Assessment LUE Assessment: Within Functional Limits  See FIM for current functional status  Salome Spotted 12/10/2013, 4:08 PM

## 2013-12-08 NOTE — Progress Notes (Signed)
[place] for Infectious Disease    Date of Admission:  11/16/2013   Total days of antibiotics 4        Day 4 vanco        Day 4 piptazo        Daily acyclovir   ID: CARLEN REBUCK is a 53 y.o. femalewith Severe COPD, recurrent PNA, recent spinal mass resection on steroids with hypoxia on rx for HCAP. 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 acyclovir  800 mg Oral BID  . ALPRAZolam  0.5 mg Oral TID  . antiseptic oral rinse  15 mL Mouth Rinse QID  . calcium-vitamin D  1 tablet Oral BID  . chlorhexidine  15 mL Mouth Rinse BID  . dexamethasone  4 mg Oral Q12H  . dextromethorphan-guaiFENesin  1 tablet Oral BID  . diltiazem  300 mg Oral Daily  . DULoxetine  60 mg Oral BID  . ferrous sulfate  325 mg Oral Daily  . furosemide  40 mg Oral Daily  . hydrocortisone   Rectal TID  . ipratropium  0.5 mg Nebulization Q6H  . levalbuterol  0.63 mg Nebulization Q6H  . levothyroxine  150 mcg Oral QAC breakfast  . Linaclotide  145 mcg Oral Daily  . loratadine  10 mg Oral QHS  . mometasone-formoterol  2 puff Inhalation BID  . nicotine  21 mg Transdermal QHS  . pantoprazole  40 mg Oral BID  . piperacillin-tazobactam (ZOSYN)  IV  3.375 g Intravenous 3 times per day  . polyethylene glycol  17 g Oral TID  . pregabalin  100 mg Oral TID  . simethicone  80 mg Oral QID  . sodium phosphate  1 enema Rectal Q0600  . tamsulosin  0.4 mg Oral QPC supper  . traZODone  150 mg Oral QHS  . vancomycin  750 mg Intravenous Q12H    Objective: Vital signs in last 24 hours: Temp:  [98 F (36.7 C)-98.6 F (37 C)] 98.6 F (37 C) (04/16 0549) Pulse Rate:  [101-120] 120 (04/16 1040) Resp:  [18-20] 20 (04/16 0549) BP: (123-136)/(79-90) 136/90 mmHg (04/16 0549) SpO2:  [92 %-96 %] 92 % (04/16 1040) Weight:  [156 lb 8.4 oz (71 kg)] 156 lb 8.4 oz (71 kg) (04/16 0549)  Constitutional:  oriented to person, place,  and time. appears well-developed and well-nourished. No distress.  HENT:  Mouth/Throat: Oropharynx is clear and moist. No oropharyngeal exudate.  Cardiovascular: Normal rate, regular rhythm and normal heart sounds. Exam reveals no gallop and no friction rub.  No murmur heard.  Pulmonary/Chest: Effort normal and breath sounds normal. No respiratory distress.  has no wheezes.  Abdominal: Soft. Bowel sounds are normal.  exhibits no distension. There is no tenderness.  Lymphadenopathy: no cervical adenopathy.  Neurological: alert and oriented to person, place, and time.  Skin: Skin is warm and dry. No rash noted. No erythema.  Psychiatric: a normal mood and affect. His behavior is normal.   Lab Results  Recent Labs  12/06/13 0713  NA 138  K 4.1  CL 98  CO2 26  BUN 36*  CREATININE 0.77    Microbiology: 4/15 sputum cx few candida 4/3 urine cx Studies/Results: Dg Swallowing Func-speech Pathology  12/07/2013   Katherene Ponto Deblois, CCC-SLP     12/07/2013 10:36 AM   Objective Swallowing Evaluation: Modified Barium Swallowing Study  Patient Details  Name: [name] MRN: [id number] Date of Birth: 12/25/1960  Today's Date: 12/07/2013 Time: 0910-0940 Time Calculation (min): 30 min  Past Medical History:  Past Medical History  Diagnosis Date  . Right-sided heart failure   . COPD (chronic obstructive pulmonary disease)   . Emphysema   . Seizures   . Thyroid disease   . Depression   . Emphysema   . Arthritis     back and legs  . Blood transfusion without reported diagnosis   . High cholesterol   . Anxiety   . Nerves   . Panic attack   . Thyroid disorder   . Hypothyroidism   . Pneumonia   . CHF (congestive heart failure)   . On home O2     4L N/C chronic  . Fibromyalgia    Past Surgical History:  Past Surgical History  Procedure Laterality Date  . Abdominal hysterectomy    . Cesarean section    . Cholecystectomy    . Laser removal condyloma    . Laminectomy N/A 11/12/2013    Procedure: THORACIC  LAMINECTOMY FOR MASS;  Surgeon: Tia Alert, MD;  Location: [facility];  Service: Neurosurgery;   Laterality: N/A;   HPI:  Pt is a 53 y.o. female with h/o severe COPD--oxygen dependent,  FMS, multiple episodes of PNA this winter, chronic thoracic pain  with compression fracture who was admitted 11/11/13 with  complaints of urinary retention as well as progressive weakness  in BLE with inability to walk. Pt reported epidural injection  approx 9 days PTA with onset of LE numbness. MRI T-spine showed  "Abnormal structures within the spinal canal at the T8 level,  larger on the left than on the right, which appear to compress  the spinal cord". She was taken to OR 3/21 for T8 thoracic  epidural mass resection. Extubated 3/22 and therapy initiated. Pt  continues with paraparesis and requires verbal as well as tactile  cues for mobility. CIR recommended by rehab team and patient  admitted 3/25. SLP bedside swallow evaluation revealed increased  SOB during PO intake and questionable increase in audible  congestion after intake. Pt with multiple admissions this winter  with PNA as well as decreased respiratory support and h/o  dysphagia. Recommend MBS prior to d/c to assess for current  aspiration risk.      Recommendation/Prognosis  Clinical Impression:   Dysphagia Diagnosis: Mild oral phase dysphagia;Moderate  pharyngeal phase dysphagia Clinical impression: Pt presents with a mild to moderate oral and  oropharyngeal dysphagia appearing to result from increased  respiratory need and reduced timing of airway closure for  adequate apneic period during the swallow. The pt has adequate  strength, but timing of swallow and sensation are impaired.   Oral function is adequate, except when cued to orally contain  bolus and coordinate breathing or for supraglottic swallow; pt  consistently continues to spill to airway. Swallow initiation is  consistently delayed with liquids falling into vestibule prior to  the swallow, without  response from pt. In 80% of trials pt keeps  vocal folds closed, but in some instances trace amounts do pass  below the cords and appear to consistently rise to level of  cords. Pt can expel with a cue for a cough/throat clear. No  strategies were effective in presenting penetration. There was  high, flash penetration with nectar, no aspiration.   Per this study penetration/aspiration is trace in quantity,  though it is suspected  that pt may have increased aspiration risk  with typical intake (increased bolus size, fatigue, pain meds  etc). Pt made it very clear that she would not be compliant with  thickened liquids when d/c'd home. May be more beneficial to  train pt in compensatory strategies to reduce risk with a regular  diet and thin liquids to increase likelihood of compliance. Will  suggest this diet to MD and treating therapist, but defer to them  for long term plan.    Swallow Evaluation Recommendations:  Diet Recommendations: Regular;Thin liquid Liquid Administration via: Cup;No straw Medication Administration: Whole meds with puree Supervision: Patient able to self feed;Intermittent supervision  to cue for compensatory strategies Compensations: Slow rate;Small sips/bites;Clear throat after each  swallow Postural Changes and/or Swallow Maneuvers: Seated upright 90  degrees Oral Care Recommendations: Oral care BID Follow up Recommendations: Other (comment) (TBD by treating  therapist)    Prognosis:  Prognosis for Safe Diet Advancement: Fair   Individuals Consulted: Consulted and Agree with Results and  Recommendations: Patient      SLP Assessment/Plan  Plan:  Speech Therapy Frequency: min 3x week   Short Term Goals: Week 1: SLP Short Term Goal 1 (Week 1): Pt will  participate in objective swallowing evaluation to assess current  aspiration risk SLP Short Term Goal 1 - Progress (Week 1): Met SLP Short Term Goal 2 (Week 1): Pt will utilize compensatory  strategies/precautions for safe swallowing with 80%  accuracy and  modified independence SLP Short Term Goal 2 - Progress (Week 1): Progressing toward  goal    General: Type of Study: Modified Barium Swallowing Study Reason for Referral: Objectively evaluate swallowing function Previous Swallow Assessment: previous MBS in 2012, pt reports  that she had difficulty at that time while she was sick but that  she has been eating/drinking whatever consistencies she wanted   PTA Diet Prior to this Study: Regular;Thin liquids Temperature Spikes Noted: No Respiratory Status: Nasal cannula History of Recent Intubation: No Behavior/Cognition: Alert;Cooperative;Pleasant mood Oral Cavity - Dentition: Adequate natural dentition Oral Motor / Sensory Function: Within functional limits Self-Feeding Abilities: Able to feed self Patient Positioning: Upright in chair Baseline Vocal Quality: Clear Volitional Cough: Congested;Strong Volitional Swallow: Able to elicit Anatomy: Within functional limits Pharyngeal Secretions: Not observed secondary MBS   Reason for Referral:   Objectively evaluate swallowing function    Oral Phase: Oral Preparation/Oral Phase Oral Phase: Impaired Oral - Nectar Oral - Nectar Cup: Other (Comment) (unable to orally contain  bolus, passive, uncontroll spillage) Oral - Thin Oral - Thin Cup:  (struggles to orally contain bolus with cues) Oral - Solids Oral - Puree: Within functional limits Oral - Mechanical Soft: Within functional limits   Pharyngeal Phase:  Pharyngeal Phase Pharyngeal Phase: Impaired Pharyngeal - Nectar Pharyngeal - Nectar Cup: Delayed swallow initiation;Reduced  epiglottic inversion;Penetration/Aspiration before swallow Penetration/Aspiration details (nectar cup): Material enters  airway, remains ABOVE vocal cords then ejected out;Material does  not enter airway Pharyngeal - Thin Pharyngeal - Thin Cup: Delayed swallow initiation;Reduced  epiglottic inversion;Reduced airway/laryngeal  closure;Penetration/Aspiration before swallow;Trace aspiration  Penetration/Aspiration details (thin cup): Material enters  airway, remains ABOVE vocal cords then ejected out;Material  enters airway, CONTACTS cords then ejected out;Material enters  airway, remains ABOVE vocal cords and not ejected out;Material  enters airway, CONTACTS cords and not ejected out;Material enters  airway, passes BELOW cords then ejected out Pharyngeal - Solids Pharyngeal - Puree: Delayed swallow initiation Pharyngeal - Regular: Delayed swallow initiation Pharyngeal - Pill: Delayed swallow  initiation (given whole in  puree)   Cervical Esophageal Phase      GN        Herbie Baltimore, MA CCC-SLP [phone]  Katherene Ponto Deblois 12/07/2013, 10:31 AM                      Assessment/Plan: hcap = will switch her to levofloxacin x 4 days to finish out course for hcap  [facility] for Infectious Diseases Cell: [phone] Pager: [phone]  12/08/2013, 1:48 PM

## 2013-12-08 NOTE — Progress Notes (Signed)
Occupational Therapy Session Note  Patient Details  Name: [name] MRN: [id number] Date of Birth: 10-Jun-1961  Today's Date: 12/08/2013 Time: 0730-0830 Time Calculation (min): 60 min  Short Term Goals: Week 3:  OT Short Term Goal 1 (Week 3): STG=LTG due to anticipated discharge to home within 7 days  Skilled Therapeutic Interventions/Progress Updates:  ADL-retraining with focus on improved lower body bathing, bed mobility, safety awareness, and slide board transfer.    Patient received supine in bed, receptive for planned activity.   With setup, patient continues high functional status for upper body bathing and dressing with only intermittent assist to manage IV line.  Patient was challenged to repeat prior level of performance with bathing LE and she managed to bring each foot up flexing hips/knees, externally rotating hips (crossed leg), to wash without use of leg lifter or LH sponge.   Patient claims she may use AE at some time but prefers manually managing LE as long as she is able.   Patient required assist only with trunk rotation during rolls, left & right, and assist to wash buttocks and apply hemrhoid ointment.   Patient continues to require total assist for lower body dressing as she will not use reacher to lace underwear and pants as instructed.   Patient completed hair care using shampoo cap and claims her husband has a method to wash her hair in bed already.    Patient completed ADL, resting as needed throughout session, without cues required.   Patient reiterated that she prefers to remain upright in w/c and required min-mod assist with slide board transfer out of bed although with good recall of sequence for task and attention to position of both LE.     Therapy Documentation Precautions:  Precautions Precautions: Back;Fall Precaution Booklet Issued: No Precaution Comments: Back precautions reviewed (BAT) Restrictions Weight Bearing Restrictions: No  Vital Signs: Therapy  Vitals Temp: 98.6 F (37 C) Temp src: Oral Pulse Rate: 105 Resp: 20 BP: 136/90 mmHg Patient Position, if appropriate: Lying Oxygen Therapy SpO2: 92 % O2 Device: Nasal cannula O2 Flow Rate (L/min): 4 L/min  Pain: Pain Assessment Pain Score: 6   ADL: ADL ADL Comments: see FIM  See FIM for current functional status  Therapy/Group: Individual Therapy  Salome Spotted 12/08/2013, 8:30 AM

## 2013-12-08 NOTE — Progress Notes (Signed)
Recreational Therapy Discharge Summary Patient Details  Name: [name] MRN: [id number] Date of Birth: Feb 07, 1961 Today's Date: 12/08/2013  Long term goals set: 1  Long term goals met: 1  Comments on progress toward goals: Pt has made good progress toward goal and is discharging home tomorrow with husband to provide 24 hour supervision/assist.  Pt is discharging at supervision-set up assist w/c level or TR tasks.  Education included overall safety, community reintegration, energy conservation, adaptation/modifications for safe leisure participation.   Reasons for discharge: discharge from hospital Patient/family agrees with progress made and goals achieved: Yes  Waldon Reining 12/08/2013, 3:10 PM

## 2013-12-08 NOTE — Progress Notes (Addendum)
Physical Therapy Session Note  Patient Details  Name: [name] MRN: [id number] Date of Birth: 19-Sep-1960  Today's Date: 12/08/2013 Time: 10:10-10:55 (85min)   Skilled Therapeutic Interventions/Progress Updates:  Tx focused on WC propulsion, therex for functional strengthening, and transfer training.   Pt up in [facility], propelled in controlled setting x250' without rest needed, supervision cues for efficiency. Worked on [facility] set-up and parts management, for which she needs assist. Pt feels confident that caregivers will be able to provide needed level of assist at home.  Performed multiple reps sliding board transfer with Mod A for advancing hips. Pt needing cues for technique, efficiency, and assist with board placement. Pt does a nice job of directing cares with assist needed.   Performed dynamic sitting balance edge of mat and attempting lateral scooting for functional strengthening, but pt unable to fully clear hips. Discussed technique and efficiency, but pt fearful of falling forward.   Instructed pt in WC push-ups for strengthening to carryoiver for more independent transfers. Pt left up in [facility] with all needs in reach.       Therapy Documentation Precautions:  Precautions Precautions: Back;Fall Precaution Booklet Issued: No Precaution Comments: Back precautions reviewed (BAT) Restrictions Weight Bearing Restrictions: No General:   Vital Signs: Therapy Vitals Pulse Rate: 120 (After transfer) Patient Position, if appropriate: Sitting Oxygen Therapy SpO2: 92 % O2 Device: Nasal cannula O2 Flow Rate (L/min): 4 L/min Pulse Oximetry Type: Intermittent Pain: Pain Assessment Pain Assessment: 0-10 Pain Score: 5  Pain Location: Back Pain Intervention(s): Repositioned    Locomotion : Wheelchair Mobility Distance: 250   See FIM for current functional status  Therapy/Group: Individual Therapy Kennieth Rad, PT, DPT   12/08/2013, 10:43 AM

## 2013-12-08 NOTE — Progress Notes (Signed)
[facility] PH[facility]ICINE & REHABILITATION     PROGRESS NOTE    Subjective/Complaints: Upset that she can't go home. "I feel better" ---wants to know what would happen if she signs out against our advise A 12 point review of systems has been performed and if not noted above is otherwise negative.   Objective: Vital Signs: Blood pressure 136/90, pulse 105, temperature 98.6 F (37 C), temperature source Oral, resp. rate 20, height $RemoveBe'5\' 2"'sfuNkRdhI$  (1.575 m), weight 71 kg (156 lb 8.4 oz), SpO2 92.00%. Dg Swallowing Func-speech Pathology  12/07/2013   Katherene Ponto Deblois, CCC-SLP     12/07/2013 10:36 AM   Objective Swallowing Evaluation: Modified Barium Swallowing Study   Patient Details  Name: [name] MRN: [id number] Date of Birth: Aug 29, 1960  Today's Date: 12/07/2013 Time: 0910-0940 Time Calculation (min): 30 min  Past Medical History:  Past Medical History  Diagnosis Date  . Right-sided heart failure   . COPD (chronic obstructive pulmonary disease)   . Emphysema   . Seizures   . Thyroid disease   . Depression   . Emphysema   . Arthritis     back and legs  . Blood transfusion without reported diagnosis   . High cholesterol   . Anxiety   . Nerves   . Panic attack   . Thyroid disorder   . Hypothyroidism   . Pneumonia   . CHF (congestive heart failure)   . On home O2     4L N/C chronic  . Fibromyalgia    Past Surgical History:  Past Surgical History  Procedure Laterality Date  . Abdominal hysterectomy    . Cesarean section    . Cholecystectomy    . Laser removal condyloma    . Laminectomy N/A 11/12/2013    Procedure: THORACIC LAMINECTOMY FOR MASS;  Surgeon: Eustace Moore, MD;  Location: [facility];  Service: Neurosurgery;   Laterality: N/A;   HPI:  Pt is a 53 y.o. female with h/o severe COPD--oxygen dependent,  FMS, multiple episodes of PNA this winter, chronic thoracic pain  with compression fracture who was admitted 11/11/13 with  complaints of urinary retention as well as progressive weakness  in BLE  with inability to walk. Pt reported epidural injection  approx 9 days PTA with onset of LE numbness. MRI T-spine showed  "Abnormal structures within the spinal canal at the T8 level,  larger on the left than on the right, which appear to compress  the spinal cord". She was taken to OR 3/21 for T8 thoracic  epidural mass resection. Extubated 3/22 and therapy initiated. Pt  continues with paraparesis and requires verbal as well as tactile  cues for mobility. CIR recommended by rehab team and patient  admitted 3/25. SLP bedside swallow evaluation revealed increased  SOB during PO intake and questionable increase in audible  congestion after intake. Pt with multiple admissions this winter  with PNA as well as decreased respiratory support and h/o  dysphagia. Recommend MBS prior to d/c to assess for current  aspiration risk.      Recommendation/Prognosis  Clinical Impression:   Dysphagia Diagnosis: Mild oral phase dysphagia;Moderate  pharyngeal phase dysphagia Clinical impression: Pt presents with a mild to moderate oral and  oropharyngeal dysphagia appearing to result from increased  respiratory need and reduced timing of airway closure for  adequate apneic period during the swallow. The pt has adequate  strength, but timing of swallow and sensation are impaired.   Oral function is  adequate, except when cued to orally contain  bolus and coordinate breathing or for supraglottic swallow; pt  consistently continues to spill to airway. Swallow initiation is  consistently delayed with liquids falling into vestibule prior to  the swallow, without response from pt. In 80% of trials pt keeps  vocal folds closed, but in some instances trace amounts do pass  below the cords and appear to consistently rise to level of  cords. Pt can expel with a cue for a cough/throat clear. No  strategies were effective in presenting penetration. There was  high, flash penetration with nectar, no aspiration.   Per this study  penetration/aspiration is trace in quantity,  though it is suspected that pt may have increased aspiration risk  with typical intake (increased bolus size, fatigue, pain meds  etc). Pt made it very clear that she would not be compliant with  thickened liquids when d/c'd home. May be more beneficial to  train pt in compensatory strategies to reduce risk with a regular  diet and thin liquids to increase likelihood of compliance. Will  suggest this diet to MD and treating therapist, but defer to them  for long term plan.    Swallow Evaluation Recommendations:  Diet Recommendations: Regular;Thin liquid Liquid Administration via: Cup;No straw Medication Administration: Whole meds with puree Supervision: Patient able to self feed;Intermittent supervision  to cue for compensatory strategies Compensations: Slow rate;Small sips/bites;Clear throat after each  swallow Postural Changes and/or Swallow Maneuvers: Seated upright 90  degrees Oral Care Recommendations: Oral care BID Follow up Recommendations: Other (comment) (TBD by treating  therapist)    Prognosis:  Prognosis for Safe Diet Advancement: Fair   Individuals Consulted: Consulted and Agree with Results and  Recommendations: Patient      SLP Assessment/Plan  Plan:  Speech Therapy Frequency: min 3x week   Short Term Goals: Week 1: SLP Short Term Goal 1 (Week 1): Pt will  participate in objective swallowing evaluation to assess current  aspiration risk SLP Short Term Goal 1 - Progress (Week 1): Met SLP Short Term Goal 2 (Week 1): Pt will utilize compensatory  strategies/precautions for safe swallowing with 80% accuracy and  modified independence SLP Short Term Goal 2 - Progress (Week 1): Progressing toward  goal    General: Type of Study: Modified Barium Swallowing Study Reason for Referral: Objectively evaluate swallowing function Previous Swallow Assessment: previous MBS in 2012, pt reports  that she had difficulty at that time while she was sick but that  she has been  eating/drinking whatever consistencies she wanted   PTA Diet Prior to this Study: Regular;Thin liquids Temperature Spikes Noted: No Respiratory Status: Nasal cannula History of Recent Intubation: No Behavior/Cognition: Alert;Cooperative;Pleasant mood Oral Cavity - Dentition: Adequate natural dentition Oral Motor / Sensory Function: Within functional limits Self-Feeding Abilities: Able to feed self Patient Positioning: Upright in chair Baseline Vocal Quality: Clear Volitional Cough: Congested;Strong Volitional Swallow: Able to elicit Anatomy: Within functional limits Pharyngeal Secretions: Not observed secondary MBS   Reason for Referral:   Objectively evaluate swallowing function    Oral Phase: Oral Preparation/Oral Phase Oral Phase: Impaired Oral - Nectar Oral - Nectar Cup: Other (Comment) (unable to orally contain  bolus, passive, uncontroll spillage) Oral - Thin Oral - Thin Cup:  (struggles to orally contain bolus with cues) Oral - Solids Oral - Puree: Within functional limits Oral - Mechanical Soft: Within functional limits   Pharyngeal Phase:  Pharyngeal Phase Pharyngeal Phase: Impaired Pharyngeal - Nectar Pharyngeal - Nectar Cup:  Delayed swallow initiation;Reduced  epiglottic inversion;Penetration/Aspiration before swallow Penetration/Aspiration details (nectar cup): Material enters  airway, remains ABOVE vocal cords then ejected out;Material does  not enter airway Pharyngeal - Thin Pharyngeal - Thin Cup: Delayed swallow initiation;Reduced  epiglottic inversion;Reduced airway/laryngeal  closure;Penetration/Aspiration before swallow;Trace aspiration Penetration/Aspiration details (thin cup): Material enters  airway, remains ABOVE vocal cords then ejected out;Material  enters airway, CONTACTS cords then ejected out;Material enters  airway, remains ABOVE vocal cords and not ejected out;Material  enters airway, CONTACTS cords and not ejected out;Material enters  airway, passes BELOW cords then ejected out  Pharyngeal - Solids Pharyngeal - Puree: Delayed swallow initiation Pharyngeal - Regular: Delayed swallow initiation Pharyngeal - Pill: Delayed swallow initiation (given whole in  puree)   Cervical Esophageal Phase      GN        Harlon Ditty, MA CCC-SLP [id number]  Riley Nearing Deblois 12/07/2013, 10:31 AM                    No results found for this basename: WBC, HGB, HCT, PLT,  in the last 72 hours  Recent Labs  12/05/13 1200 12/06/13 0713  NA 137 138  K 5.6* 4.1  CL 99 98  GLUCOSE 403* 199*  BUN 43* 36*  CREATININE 0.78 0.77  CALCIUM 8.9 8.5   CBG (last 3)  No results found for this basename: GLUCAP,  in the last 72 hours  Wt Readings from Last 3 Encounters:  12/08/13 71 kg (156 lb 8.4 oz)  11/14/13 73.755 kg (162 lb 9.6 oz)  11/14/13 73.755 kg (162 lb 9.6 oz)    Physical Exam:  Constitutional: She is oriented to person, place, and time. She appears well-developed and well-nourished.  HENT: dentition fair. Oral mucosa pink/moist  Head: Normocephalic and atraumatic.  Eyes: Conjunctivae are normal.  Cardiovascular: Normal rate and regular rhythm. No murmur  Respiratory: No respiratory distress. A few rhonchi still. Decreased air movement overall especially at bases persists GI: Soft. Bowel sounds are normal. She exhibits no distension. There is no tenderness.  Musculoskeletal: She exhibits no edema.  No calf tenderness Neurological: She is alert and oriented to person, place, and time.  Paraparesis with sensory deficits. DTR absent BLE. LLE tr to 1 to 1+/5 HF,KE. Tr at ankle. RLE is 1+hf, 2-ke, 3 ankle at best. Sensation 1/2 LLE 1+ RLE. T8 Sensory level. Tone LE minimal to none Skin: Skin is warm and dry.  Psychiatric: She has a normal mood and affect. Her behavior is normal.    Assessment/Plan: 1. 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 Physiatrist is providing close team supervision and 24 hour management of active medical problems listed below. Physiatrist and rehab team continue to assess barriers to discharge/monitor patient progress toward functional and medical goals.     FIM: FIM - Bathing Bathing Steps Patient Completed: Chest;Right Arm;Left Arm;Abdomen;Front perineal area;Right upper leg;Left upper leg;Right lower leg (including foot);Left lower leg (including foot) Bathing: 4: Min-Patient completes 8-9 23f 10 parts or 75+ percent  FIM - Upper Body Dressing/Undressing Upper body dressing/undressing steps patient completed: Thread/unthread right sleeve of pullover shirt/dresss;Thread/unthread left sleeve of pullover shirt/dress;Put head through opening of pull over shirt/dress;Pull shirt over trunk Upper body dressing/undressing: 5: Set-up assist to: Obtain clothing/put away FIM - Lower Body Dressing/Undressing Lower body dressing/undressing steps patient completed: Thread/unthread right underwear leg;Thread/unthread right pants leg Lower  body dressing/undressing: 1: Total-Patient completed less than 25% of tasks  FIM - Musician Devices: Grab bar or rail for support Toileting: 1: Total-Patient completed zero steps, helper did all 3  FIM - Radio producer Devices: Recruitment consultant Transfers: 0-Activity did not occur  FIM - Control and instrumentation engineer Devices: Sliding board;Arm rests;Bed rails;HOB elevated Bed/Chair Transfer: 3: Chair or W/C > Bed: Mod A (lift or lower assist);3: Bed > Chair or W/C: Mod A (lift or lower assist);3: Supine > Sit: Mod A (lifting assist/Pt. 50-74%/lift 2 legs;3: Sit > Supine: Mod A (lifting assist/Pt. 50-74%/lift 2 legs)  FIM - Locomotion: Wheelchair Distance: 150 Locomotion: Wheelchair: 5: Travels 150 ft or more: maneuvers on rugs and over door sills with supervision, cueing or  coaxing FIM - Locomotion: Ambulation Locomotion: Ambulation Assistive Devices:  (unable to perform safely) Locomotion: Ambulation: 0: Activity did not occur  Comprehension Comprehension Mode: Auditory Comprehension: 5-Understands complex 90% of the time/Cues < 10% of the time  Expression Expression Mode: Verbal Expression: 5-Expresses basic needs/ideas: With extra time/assistive device  Social Interaction Social Interaction: 6-Interacts appropriately with others with medication or extra time (anti-anxiety, antidepressant).  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 DVT Prophylaxis/Anticoagulation: Mechanical: Antiembolism stockings, knee (TED hose) Bilateral lower extremities  Sequential compression devices, below knee Bilateral lower extremities  -dopplers negative.  2. Chronic pain due to fibromyalgia as well as back pain with neuropathy/ Pain Management: reduce oxycodone 5 mg qid  as well Lyrica 100 mg tid and cymbalta bid    -follow for activity tolerance 3. Depression with anxiety and panic attacks/Mood: Team to provide ego support. LCSW to follow for evaluation and support. Will continue cymbalta bid, xanax 0.5 mg tid as well as trazodone at bedtime for mood and insomnia.  4.Neuropsych: This patient is capable of making decisions on her own behalf.  5. Severe COPD: Continue oxygen 4 liters--used at home. Continue spiriva and dulera.    -albuterol changed to xopenex neb--  -scheduled mucinex  .  6. LLL pneumonia---appreciate CCM/ID help. On vanc and zosyn---need to narrow covg---switch to oral tomorrow?  -continue IS, FV    -tobacco abstinence  -remains at high risk for future problems given multiple issues  above 6.Neurogenic Bowel and bladder :      -on linzess  -reduced narcotics,      -senna    7. Chronic renal failure: Baseline BUN/Cr- 39/1.80 at admission. Near baseline---continue current meds  -BUN/CR close to baseline   -labs pending today 8. Diastolic dysfunction with tachycardia (as compensatory for hypoxia)   -lasix resumed at $RemoveBe'40mg'KSeiXhuxc$  daily 9. Surgical- decadron taper   10. Bladder: I and O cath prn/ voiding trial---will go home with a foley  -continue low dose flomax      LOS (Days) 22 A FACE TO FACE EVALUATION WAS PERFORMED  Meredith Staggers 12/08/2013 8:42 AM

## 2013-12-09 LAB — CULTURE, RESPIRATORY W GRAM STAIN

## 2013-12-09 LAB — CULTURE, RESPIRATORY

## 2013-12-09 MED ORDER — LEVOFLOXACIN 750 MG PO TABS: 750.0000 mg | ORAL_TABLET | Freq: Every day | ORAL | Status: DC

## 2013-12-09 MED ORDER — FLUCONAZOLE 200 MG PO TABS
200.0000 mg | ORAL_TABLET | Freq: Once | ORAL | Status: AC
  Administered 2013-12-09: 200 mg via ORAL
  Filled 2013-12-09: qty 1

## 2013-12-09 MED ORDER — SIMETHICONE 40 MG/0.6ML PO SUSP: 80.0000 mg | Freq: Four times a day (QID) | ORAL | Status: DC

## 2013-12-09 MED ORDER — FLEET ENEMA 7-19 GM/118ML RE ENEM: 1.0000 | ENEMA | Freq: Every day | RECTAL | Status: DC

## 2013-12-09 MED ORDER — FERROUS SULFATE 325 (65 FE) MG PO TBEC: 325.0000 mg | DELAYED_RELEASE_TABLET | Freq: Every day | ORAL | Status: DC

## 2013-12-09 MED ORDER — DEXAMETHASONE 4 MG PO TABS: 4.0000 mg | ORAL_TABLET | Freq: Every day | ORAL | Status: DC

## 2013-12-09 MED ORDER — DM-GUAIFENESIN ER 30-600 MG PO TB12: 1.0000 | ORAL_TABLET | Freq: Two times a day (BID) | ORAL | Status: DC

## 2013-12-09 MED ORDER — MOMETASONE FURO-FORMOTEROL FUM 100-5 MCG/ACT IN AERO: 2.0000 | INHALATION_SPRAY | Freq: Two times a day (BID) | RESPIRATORY_TRACT | Status: DC

## 2013-12-09 MED ORDER — DEXAMETHASONE 4 MG PO TABS: ORAL_TABLET | ORAL | Status: DC

## 2013-12-09 MED ORDER — POLYETHYLENE GLYCOL 3350 17 G PO PACK: 17.0000 g | PACK | Freq: Three times a day (TID) | ORAL | Status: DC

## 2013-12-09 MED ORDER — BIOTENE DRY MOUTH MT LIQD: 15.0000 mL | Freq: Four times a day (QID) | OROMUCOSAL | Status: DC

## 2013-12-09 MED ORDER — RESOURCE THICKENUP CLEAR PO POWD: 1.0000 g | ORAL | Status: DC | PRN

## 2013-12-09 MED ORDER — OXYCODONE HCL 5 MG PO TABS: 5.0000 mg | ORAL_TABLET | Freq: Four times a day (QID) | ORAL | Status: AC | PRN

## 2013-12-09 MED ORDER — LEVALBUTEROL HCL 0.63 MG/3ML IN NEBU: 0.6300 mg | INHALATION_SOLUTION | RESPIRATORY_TRACT | Status: DC | PRN

## 2013-12-09 MED ORDER — MUSCLE RUB 10-15 % EX CREA: 1.0000 "application " | TOPICAL_CREAM | CUTANEOUS | Status: DC | PRN

## 2013-12-09 NOTE — Progress Notes (Signed)
[facility] PHYSICAL MEDICINE & REHABILITATION     PROGRESS NOTE    Subjective/Complaints: In good spirits. Excited to be going home today!! Breathing fairly well. Occasional cough A 12 point review of systems has been performed and if not noted above is otherwise negative.   Objective: Vital Signs: Blood pressure 150/92, pulse 91, temperature 97.7 F (36.5 C), temperature source Oral, resp. rate 16, height 5\' 2"  (1.575 m), weight 71.7 kg (158 lb 1.1 oz), SpO2 95.00%. No results found.  Recent Labs  12/08/13 1400  WBC 8.4  HGB 13.7  HCT 40.3  PLT 90*    Recent Labs  12/08/13 1400  NA 135*  K 4.2  CL 94*  GLUCOSE 403*  BUN 39*  CREATININE 0.96  CALCIUM 8.4   CBG (last 3)  No results found for this basename: GLUCAP,  in the last 72 hours  Wt Readings from Last 3 Encounters:  12/09/13 71.7 kg (158 lb 1.1 oz)  11/14/13 73.755 kg (162 lb 9.6 oz)  11/14/13 73.755 kg (162 lb 9.6 oz)    Physical Exam:  Constitutional: She is oriented to person, place, and time. She appears well-developed and well-nourished.  HENT: dentition fair. Oral mucosa pink/moist  Head: Normocephalic and atraumatic.  Eyes: Conjunctivae are normal.  Cardiovascular: Normal rate and regular rhythm. No murmur  Respiratory: No respiratory distress. A few rhonchi still. Decreased air movement overall especially at bases persists GI: Soft. Bowel sounds are normal. She exhibits no distension. There is no tenderness.  Musculoskeletal: She exhibits no edema.  No calf tenderness Neurological: She is alert and oriented to person, place, and time.  Paraparesis with sensory deficits. DTR absent BLE. LLE tr to 1 to 1+/5 HF,KE. Tr at ankle. RLE is 1+hf, 2-ke, 3 ankle at best. Sensation 1/2 LLE 1+ RLE. T8 Sensory level. Tone LE minimal to none Skin: Skin is warm and dry.  Psychiatric: She has a normal mood and affect. Her behavior is normal.    Assessment/Plan: 1. Functional deficits secondary to T8 spinal  tumor s/p resection with paraplegia/myelopathy which require 3+ hours per day of interdisciplinary therapy in a comprehensive inpatient rehab setting. Physiatrist is providing close team supervision and 24 hour management of active medical problems listed below. Physiatrist and rehab team continue to assess barriers to discharge/monitor patient progress toward functional and medical goals.  Home today. Discussed health hygiene and many topics regarding her care this am.follow up with me in about a month  FIM: FIM - Bathing Bathing Steps Patient Completed: Chest;Right Arm;Left Arm;Abdomen;Front perineal area;Right upper leg;Left upper leg;Right lower leg (including foot);Left lower leg (including foot) Bathing: 4: Min-Patient completes 8-9 110f 10 parts or 75+ percent  FIM - Upper Body Dressing/Undressing Upper body dressing/undressing steps patient completed: Thread/unthread right sleeve of pullover shirt/dresss;Thread/unthread left sleeve of pullover shirt/dress;Put head through opening of pull over shirt/dress;Pull shirt over trunk Upper body dressing/undressing: 5: Set-up assist to: Obtain clothing/put away FIM - Lower Body Dressing/Undressing Lower body dressing/undressing steps patient completed: Thread/unthread right underwear leg;Thread/unthread right pants leg Lower body dressing/undressing: 1: Total-Patient completed less than 25% of tasks  FIM - Toileting Toileting steps completed by patient: Performs perineal hygiene Toileting Assistive Devices: Grab bar or rail for support Toileting: 1: Total-Patient completed zero steps, helper did all 3  FIM - Radio producer Devices: Recruitment consultant Transfers: 0-Activity did not occur (prefers bedpan)  FIM - Control and instrumentation engineer Devices: Arm rests;Sliding board Bed/Chair Transfer: 3: Bed >  Chair or W/C: Mod A (lift or lower assist);3: Chair or W/C > Bed: Mod A (lift or lower  assist)  FIM - Locomotion: Wheelchair Distance: 250 Locomotion: Wheelchair: 5: Travels 150 ft or more: maneuvers on rugs and over door sills with supervision, cueing or coaxing FIM - Locomotion: Ambulation Locomotion: Ambulation Assistive Devices:  (unable to perform safely) Locomotion: Ambulation: 0: Activity did not occur  Comprehension Comprehension Mode: Auditory Comprehension: 5-Understands complex 90% of the time/Cues < 10% of the time  Expression Expression Mode: Verbal Expression: 5-Expresses basic needs/ideas: With no assist  Social Interaction Social Interaction: 6-Interacts appropriately with others with medication or extra time (anti-anxiety, antidepressant).  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 DVT Prophylaxis/Anticoagulation: Mechanical: Antiembolism stockings, knee (TED hose) Bilateral lower extremities  Sequential compression devices, below knee Bilateral lower extremities  -dopplers negative.  2. Chronic pain due to fibromyalgia as well as back pain with neuropathy/ Pain Management: reduce oxycodone 5 mg qid  as well Lyrica 100 mg tid and cymbalta bid    -follow for activity tolerance 3. Depression with anxiety and panic attacks/Mood: Team to provide ego support. LCSW to follow for evaluation and support. Will continue cymbalta bid, xanax 0.5 mg tid as well as trazodone at bedtime for mood and insomnia.  4.Neuropsych: This patient is capable of making decisions on her own behalf.  5. Severe COPD: Continue oxygen 4 liters--used at home. Continue spiriva and dulera.    -albuterol changed to xopenex neb--  -scheduled mucinex  .  6. LLL pneumonia---on levaquin for 4 days to complete course  -continue IS,  FV    -tobacco abstinence  -remains at high risk for future problems given multiple issues above 6.Neurogenic Bowel and bladder :      -on linzess  -reduced narcotics,      -senna    7. Chronic renal failure: Baseline BUN/Cr- 39/1.80 at admission. Near baseline---continue current meds  -BUN/CR close to baseline   -labs pending today 8. Diastolic dysfunction with tachycardia (as compensatory for hypoxia)   -lasix resumed at 40mg  daily 9. Surgical- decadron taper   10. Bladder: I and O cath prn/ voiding trial---will go home with a foley  -continue low dose flomax      LOS (Days) 23 A FACE TO FACE EVALUATION WAS PERFORMED  Meredith Staggers 12/09/2013 10:17 AM

## 2013-12-09 NOTE — Progress Notes (Signed)
Patient discharged home.  Patient and family verbalized understanding of discharge instructions.  VSS.  Appears to be in no immediate distress at this time.  Left floor via wheelchair, escorted by nursing staff and family.  Brita Romp, RN

## 2013-12-09 NOTE — Discharge Instructions (Signed)
Inpatient Rehab Discharge Instructions  [name] Discharge date and time:  12/09/13  Activities/Precautions/ Functional Status: Activity: activity as tolerated with assistance Diet: cardiac diet Wound Care: none needed  Functional status:  ___ No restrictions     ___ Walk up steps independently _X__ 24/7 supervision/assistance   ___ Walk up steps with assistance ___ Intermittent supervision/assistance  ___ Bathe/dress independently ___ Walk with walker     _X__ Bathe/dress with assistance ___ Walk Independently    ___ Shower independently ___ Walk with assistance    ___ Shower with assistance _X__ No alcohol     ___ Return to work/school ________  COMMUNITY REFERRALS UPON DISCHARGE:   Home Health:   PT     OT     ST    RN      Agency:  [facility]  Phone:  [phone] Medical Equipment/Items Ordered:  18x16 wheelchair; 30" transfer board; hospital bed; drop arm commode  Agency/Supplier:  [facility] Other:  You already receive oxygen from [facility] Instructions: 1.  Continue daily bowel program--suppository in am as well as Miralax three times a day.  2.  Continue foley till follow up with urology. 3. Need to set up follow up with Lung doctor (pulmonologist)--to be seen in next 7-10 days.    My questions have been answered and I understand these instructions. I will adhere to these goals and the provided educational materials after my discharge from the hospital.  Patient/Caregiver Signature _______________________________ Date __________  Clinician Signature _______________________________________ Date __________  Please bring this form and your medication list with you to all your follow-up doctor's appointments.

## 2013-12-09 NOTE — Progress Notes (Signed)
Speech Language Pathology Discharge Summary  Patient Details  Name: [name] MRN: [id number] Date of Birth: 02-16-1961  Today's Date: 12/09/2013   Patient has met 1 of 1 long term goals.  Patient to discharge at overall Supervision level.  Reasons goals not met: N/A   Clinical Impression/Discharge Summary: Pt has met 1 out of 1 LTG this admission due to increased safety with PO intake. SLP recommendations were for regular textures and nectar thick liquids with the water protocol in between meals, and provided education and rationale for modifications. Pt has decided to accept the risk of aspiration, and wishes to consume regular textures with nectar thick liquids during meals and any thin liquids in between. Pt verbalized her intent to drink all thin liquids upon d/c home, so therapy focused on education of recommendations as well as use of strategies to maximize swallowing safety with whichever consistency pt is consuming. Pt utilizes her strategies at the supervision level. Education with pt and husband is ongoing. Recommend continued [facility] SLP services upon d/c home to maximize swallowing safety and continue education attempts.  Care Partner:  Caregiver Able to Provide Assistance: Yes  Type of Caregiver Assistance:  (swallowing)  Recommendation:  Home Health SLP  Rationale for SLP Follow Up: Maximize swallowing safety   Equipment:   N/A  Reasons for discharge: Treatment goals met;Discharged from hospital   Patient/Family Agrees with Progress Made and Goals Achieved: Yes   See FIM for current functional status   Germain Osgood, M.A. CCC-SLP [phone]  Germain Osgood 12/09/2013, 4:35 PM

## 2013-12-12 ENCOUNTER — Other Ambulatory Visit: Payer: Self-pay | Admitting: *Deleted

## 2013-12-12 DIAGNOSIS — J189 Pneumonia, unspecified organism: Secondary | ICD-10-CM | POA: Diagnosis not present

## 2013-12-12 MED ORDER — DILTIAZEM HCL ER BEADS 300 MG PO CP24: 300.0000 mg | ORAL_CAPSULE | Freq: Every day | ORAL | Status: AC

## 2013-12-12 NOTE — Discharge Summary (Signed)
Physician Discharge Summary  Patient ID: [name] MRN: [id number] DOB/AGE: [birth year] 53 y.o.  Admit date: 11/16/2013 Discharge date: 12/10/2013  Discharge Diagnoses:  Principal Problem:   Thoracic spine tumor Active Problems:   History of anxiety - chronic alprazolam   TOBACCO ABUSE   Acute-on-chronic respiratory failure   History of depression, chronic antidepressants   Urinary retention   PNA (pneumonia)   Discharged Condition: Improved.   Significant Diagnostic Studies: Dg Chest 2 View  12/06/2013   CLINICAL DATA:  COPD.  Shortness of breath.  Followup of pneumonia.  EXAM: CHEST  2 VIEW  COMPARISON:  CT CHEST W/O CM dated 12/05/2013; DG CHEST 2 VIEW dated 12/05/2013  FINDINGS: Hyperinflation. Osteopenia. Thoracic compression deformity which is better evaluated on yesterday's CT.  Midline trachea. Mild cardiomegaly. No pleural effusion or pneumothorax. Chin overlies the apices. Right base volume loss. Slight improvement in left base airspace disease. No congestive failure.  IMPRESSION: Slight improvement in left base airspace disease/ pneumonia.  COPD with right base atelectasis and volume loss.   Electronically Signed   By: Abigail Miyamoto M.D.   On: 12/06/2013 08:34   Dg Chest 2 View  12/05/2013   CLINICAL DATA:  Cough with hypoxia  EXAM: CHEST  2 VIEW  COMPARISON:  November 21, 2013  FINDINGS: There is increased opacity in the left base compared to recent prior study. This finding is felt to represent progression of pneumonia. There is patchy atelectasis in the right base which is stable. Heart is prominent with normal pulmonary vascularity, stable. There appears to be some underlying emphysematous change. Compression fractures again noted in the thoracic spine.  IMPRESSION: Increase in left base infiltrate. Otherwise no change from recent prior study.   Electronically Signed   By: Lowella Grip M.D.   On: 12/05/2013 10:40   12/05/2013   CLINICAL DATA:  Nonproductive cough, malaise  and shortness of breath. History of left lower lobe pneumonia.  EXAM: CT CHEST WITHOUT CONTRAST  TECHNIQUE: Multidetector CT imaging of the chest was performed following the standard protocol without IV contrast.  COMPARISON:  09/19/2013 and 07/24/2013. Prior chest radiographs dating back to 09/19/2013.  FINDINGS: No pathologically enlarged mediastinal lymph nodes. Hilar regions are difficult to definitively evaluate without IV contrast. No axillary adenopathy. Pulmonary arteries are enlarged. Coronary artery calcification. Heart is at the upper limits of normal in size. No pericardial effusion.  Fairly severe centrilobular emphysema. Lingular and left lower lobe airspace consolidation. Scarring in the right middle lobe. No pleural fluid. Airway is unremarkable.  Incidental imaging of the upper abdomen shows the visualized portions of the liver, adrenal glands, spleen and stomach to be grossly unremarkable. Low-attenuation lesions in the upper pole right kidney are incompletely imaged. No upper abdominal adenopathy. No worrisome lytic or sclerotic lesions. T7 and T8 compression fractures are stable. Slight increase in superior endplate compression involving T9.  IMPRESSION: 1. Patchy airspace disease in the lingula and left lower lobe is indicative of pneumonia. Continued follow-up to clearing is recommended to exclude underlying malignancy. 2. Three-vessel coronary artery calcification. 3. Pulmonary arterial enlargement, indicative of pulmonary arterial hypertension. 4. Thoracic compression fractures. Superior endplate compression involving T9 appears mildly progressive.   Electronically Signed   By: Lorin Picket M.D.   On: 12/05/2013 15:20   Dg Abd 2 Views  11/16/2013   CLINICAL DATA:  Mid abdominal pain, constipation  EXAM: ABDOMEN - 2 VIEW  COMPARISON:  DG ABD PORTABLE 1V dated 08/20/2013; DG ABDOMEN 2V dated 12/23/2012;  CT ABD-PELV W/ CM dated 12/28/2012  FINDINGS: Large colonic stool burden without evidence  of obstruction. There is moderate gaseous distention of the stomach. No pneumoperitoneum, pneumatosis or portal venous gas.  Post cholecystectomy.  Post ORIF of the left femoral neck.  Note definitive abnormal intra-abdominal calcifications.  No acute osseus abnormalities.  IMPRESSION: Large colonic stool burden without evidence of obstruction.   Electronically Signed   By: Sandi Mariscal M.D.   On: 11/16/2013 10:23     Labs:  Basic Metabolic Panel:  Recent Labs Lab 12/05/13 1200 12/06/13 0713 12/08/13 1400  NA 137 138 135*  K 5.6* 4.1 4.2  CL 99 98 94*  CO2 20 26 19   GLUCOSE 403* 199* 403*  BUN 43* 36* 39*  CREATININE 0.78 0.77 0.96  CALCIUM 8.9 8.5 8.4    CBC:  Recent Labs Lab 12/08/13 1400  WBC 8.4  HGB 13.7  HCT 40.3  MCV 98.1  PLT 90*    CBG: No results found for this basename: GLUCAP,  in the last 168 hours  Brief HPI:   [name] is a 53 y.o. female with history of severe COPD--oxygen dependent, FMS, multiple episodes of PNA this winter, , chronic thoracic pain with compression fracture who was admitted on 11/11/13 with complaints of urinary retention as well as progressive weakness in BLE with inability to walk for a week.  MRI T-spine which showed "Abnormal structures within the spinal canal at the T8 level, larger on the left than on the right, which appear to compress the spinal cord". She was taken to OR  11/12/13 for T8 thoracic epidural mass resection by Dr. Ronnald Ramp. Pathology with fibroadipose tissue with fat necrosis as well as degenerative cartilaginous tissue with abundant necrosis. Patient continued to be paraplegic requiring verbal as well as tactile cues for mobility. CIR recommended by rehab team for follow up  therapies.     Hospital Course: [name] was admitted to rehab 11/16/2013 for inpatient therapies to consist of PT, ST and OT at least three hours five days a week. Past admission physiatrist, therapy team and rehab RN have worked together to  provide customized collaborative inpatient rehab. BLE dopplers were done showing no evidence of DVT.  Lasix and Cardizem were resumed to help with blood pressure as well as avoid overload due to h/o diastolic dysfunction. Renal status has been monitored with routine checks and has been stable. Follow up CBC shows ABLA has resolved.  Foley was discontinued and she continued to have problems with urinary retention despite addition of flomax. In and out caths were done during the stay and patient has been unable to void. She has elected on indwelling foley for her neurogenic bladder. She has had problems with severe constipation requiring SMOG enemas due to significant stool burden as well as abdominal pain. She was placed on clear diet and NGT was placed to help with gastric distension X 48 hours on 04/03/015. She was started on bowel program and is to continue with daily enemas to help with neurogenic bowel. Oxycodone was decreased to 5 mg qid prn and has been effective in pain management.  Anxiety levels have improved with ego support by team as well as education on behavior modification techniques by Dr. Beverly Gust.  She reported increase in dyspnea with hypoxia and Dr. Lake Bells was consulted for input on 12/05/13. He recommended  IV Vanc and Zosyn due to history of Burkholderia Cepcia and patient had improvement in symptoms within 24 hours. CT  of chest showed "patchy airspace disease in the lingula and left lower lobe indicative of pneumonia" and CCM recommended ID input for duration of Tx.  Dr. Baxter Flattery recommended changing patient to po Levaquin for 8 total days of antibiotic therapy regimen. MBS was done to evaluate swallow showed  trace penetration and aspiration with thin liquids. Patient and husband were  educated on water protocol between meals and nectar liquids with meals to prevent aspiration episodes. Patient does not drink water and has declined water protocol and has decided to accept aspiration risks.  She has been educated on tobacco cessation to help with her pulmonary issues as well as wound healing. Back incision is clean, dry and healing well. She requires moderate assist overall and will continue to receive Home Health PT, OT, RN and ST by [facility] past discharge.    Rehab course: During patient's stay in rehab weekly team conferences were held to monitor patient's progress, set goals and discuss barriers to discharge. Patient has had improvement in activity tolerance, balance, postural control, as well as ability to compensate for deficits. Family education was done with husband regarding all aspects of care as well as utilization of strategies for aspiration prevention. She requires min assist for bathing. She is able to complete UB dressing with set up assist and needs total assist for LB dressing and toileting. She requires mod assist for sliding board transfers. She is able to propel WC 250 feet with supervision.   Disposition: 06-Home-Health Care Svc  Diet: Cardiac diet.   Special Instructions: 1.  Continue daily bowel program--suppository in am as well as Miralax three times a day.  2.  Continue foley till follow up with urology. 3. Need to set up follow up with Lung doctor (pulmonologist)--to be seen in next 7-10 days.         Future Appointments Provider Department Dept Phone   01/31/2014 10:00 AM Meredith Staggers, MD [employer] [facility] [phone]       Medication List    STOP taking these medications       albuterol (2.5 MG/3ML) 0.083% nebulizer solution  Commonly known as:  PROVENTIL     potassium chloride SA 20 MEQ tablet  Commonly known as:  K-DUR,KLOR-CON      TAKE these medications       acetaminophen 325 MG tablet  Commonly known as:  TYLENOL  Take 2 tablets (650 mg total) by mouth every 6 (six) hours as needed for mild pain (or Fever >/= 101).     acyclovir 800 MG tablet  Commonly known as:  ZOVIRAX  Take 1  tablet (800 mg total) by mouth 2 (two) times daily.     ALPRAZolam 0.5 MG tablet  Commonly known as:  XANAX  Take 0.5 mg by mouth 3 (three) times daily.     antiseptic oral rinse Liqd  15 mLs by Mouth Rinse route QID.     CALCIUM 600 + D PO  Take 1 tablet by mouth 2 (two) times daily.     dexamethasone 4 MG tablet  Commonly known as:  DECADRON  - Take one pill daily for 7 days.   - Then decrease to 1/2 pill daily till gone     dextromethorphan-guaiFENesin 30-600 MG per 12 hr tablet  Commonly known as:  MUCINEX DM  Take 1 tablet by mouth 2 (two) times daily.     diltiazem 300 MG 24 hr capsule  Commonly known as:  TIAZAC  Take 300 mg by mouth daily.     DULoxetine 60 MG capsule  Commonly known as:  CYMBALTA  Take 60 mg by mouth 2 (two) times daily.     ferrous sulfate 325 (65 FE) MG EC tablet  Take 1 tablet (325 mg total) by mouth daily.     Fluticasone-Salmeterol 250-50 MCG/DOSE Aepb  Commonly known as:  ADVAIR  Inhale 1 puff into the lungs every 12 (twelve) hours.     furosemide 40 MG tablet  Commonly known as:  LASIX  Take 40 mg by mouth daily.     levalbuterol 0.63 MG/3ML nebulizer solution  Commonly known as:  XOPENEX  Take 3 mLs (0.63 mg total) by nebulization every 4 (four) hours as needed for wheezing or shortness of breath.     levofloxacin 750 MG tablet  Commonly known as:  LEVAQUIN  Take 1 tablet (750 mg total) by mouth daily.     levothyroxine 150 MCG tablet  Commonly known as:  SYNTHROID, LEVOTHROID  Take 150 mcg by mouth daily.     LINZESS 145 MCG Caps capsule  Generic drug:  Linaclotide  Take 145 mcg by mouth daily.     loratadine 10 MG tablet  Commonly known as:  CLARITIN  Take 10 mg by mouth at bedtime.     MUSCLE RUB 10-15 % Crea  Apply 1 application topically as needed for muscle pain.     nicotine 14 mg/24hr patch  Commonly known as:  NICODERM CQ - dosed in mg/24 hours  Place 1 patch (14 mg total) onto the skin at bedtime.      oxyCODONE 5 MG immediate release tablet  Commonly known as:  Oxy IR/ROXICODONE  Take 1 tablet (5 mg total) by mouth every 6 (six) hours as needed for severe pain.     pantoprazole 40 MG tablet  Commonly known as:  PROTONIX  Take 1 tablet (40 mg total) by mouth 2 (two) times daily.     polyethylene glycol packet  Commonly known as:  MIRALAX / GLYCOLAX  Take 17 g by mouth 3 (three) times daily. For constipation     pregabalin 100 MG capsule  Commonly known as:  LYRICA  Take 100 mg by mouth 3 (three) times daily.     RESOURCE THICKENUP CLEAR Powd  Take 1 g by mouth as needed.     simethicone 40 MG/0.6ML drops  Commonly known as:  MYLICON  Take 1.2 mLs (80 mg total) by mouth 4 (four) times daily. For gas and bloating.     sodium phosphate 7-19 GM/118ML Enem  - Place 133 mLs (1 enema total) rectally daily at 6 (six) AM. Available over the counter.   - Needs to be done every morning.     tamsulosin 0.4 MG Caps capsule  Commonly known as:  FLOMAX  Take 1 capsule (0.4 mg total) by mouth daily after supper.     tiotropium 18 MCG inhalation capsule  Commonly known as:  SPIRIVA  Place 18 mcg into inhaler and inhale daily.     traZODone 150 MG tablet  Commonly known as:  DESYREL  Take 150 mg by mouth at bedtime.     Vitamin B-12 5000 MCG Subl  Place 5,000 mcg under the tongue daily.       Follow-up Information   Follow up with Meredith Staggers, MD On 01/31/2014. (Be there at 9:40 am   for 10 am   appointment)    Specialty:  Physical Medicine and  Rehabilitation   Contact information:   [address], [place] [address] [phone]       Follow up with Eustace Moore, MD. Call today. (for follow up appointment. )    Specialty:  Neurosurgery   Contact information:   [address] [place] [address] [phone]       Follow up with MACDIARMID,SCOTT A, MD On 12/27/2013. (Be there at 12:45 for evaluation of bladder dysfucntion/urinary problems)     Specialty:  Urology   Contact information:   [facility]  [place] [id number] [phone]       Follow up with [name], NP On 12/21/2013. (@ 2:00 pm)    Contact information:   [place] [address] [phone]       Signed: Bary Leriche 12/12/2013, 10:16 AM

## 2013-12-12 NOTE — Progress Notes (Signed)
Social Work Discharge Note  The overall goal for the admission was met for:   Discharge location: Yes - pt went home with family  Length of Stay: No - pt d/c'd two days late due to development of pneumonia at end of stay and requiring IV antibiotics and medical monitoring  Discharge activity level: Yes - minimal assistance  Home/community participation: Yes  Services provided included: MD, RD, PT, OT, SLP, RN, TR, Pharmacy, Neuropsych and SW  Financial Services: Medicare and Medicaid  Follow-up services arranged: Home Health: RN, PT, OT, SLP, DME: hospital bed, 18x16 w/c, drop arm commode, 30' transfer board and Patient/Family request agency HH: [facility], DME: [facility]  Comments (or additional information):  Patient/Family verbalized understanding of follow-up arrangements: Yes  Individual responsible for coordination of the follow-up plan: pt, husband, and son  Confirmed correct DME delivered: Jennifer Capps Prevatt 12/12/2013    Jennifer Capps Prevatt 

## 2013-12-12 NOTE — Progress Notes (Signed)
Social Work Patient ID: [name], female   DOB: April 04, 1961, 53 y.o.   MRN: [id number]  Late entry:  CSW updated pt and husband on team conference discussion.  Pt was disappointed with new development of pneumonia on d/c date of 12-07-13.  So, pt was delayed in d/c until 12-09-13.  Pt was also started on thickened liquids.  Husband plans to encourage pt to continue this at home, although pt was very frustrated with this new development.  All DME had been delivered to the home and Arville Go was scheduled to see pt on 12-11-13.  Pt's son came to take pt home, as pt's husband had a funeral to attend.  Pt was very appreciative of care of Rehab but was very excited to be going home.  Husband had a ramp built for pt's w/c.

## 2013-12-21 ENCOUNTER — Inpatient Hospital Stay (HOSPITAL_COMMUNITY): Payer: Medicare Other

## 2013-12-21 ENCOUNTER — Emergency Department (HOSPITAL_COMMUNITY): Payer: Medicare Other

## 2013-12-21 ENCOUNTER — Encounter (HOSPITAL_COMMUNITY): Payer: Self-pay | Admitting: Emergency Medicine

## 2013-12-21 ENCOUNTER — Inpatient Hospital Stay (HOSPITAL_COMMUNITY)
Admission: EM | Admit: 2013-12-21 | Discharge: 2013-12-23 | DRG: 193 | Disposition: A | Discharge status: Other Acute Inpatient Hospital | Payer: Medicare Other | Attending: Internal Medicine | Admitting: Internal Medicine

## 2013-12-21 DIAGNOSIS — Z9079 Acquired absence of other genital organ(s): Secondary | ICD-10-CM

## 2013-12-21 DIAGNOSIS — E119 Type 2 diabetes mellitus without complications: Secondary | ICD-10-CM | POA: Diagnosis present

## 2013-12-21 DIAGNOSIS — D696 Thrombocytopenia, unspecified: Secondary | ICD-10-CM | POA: Diagnosis present

## 2013-12-21 DIAGNOSIS — R739 Hyperglycemia, unspecified: Secondary | ICD-10-CM | POA: Diagnosis present

## 2013-12-21 DIAGNOSIS — Z87891 Personal history of nicotine dependence: Secondary | ICD-10-CM | POA: Diagnosis not present

## 2013-12-21 DIAGNOSIS — R29898 Other symptoms and signs involving the musculoskeletal system: Secondary | ICD-10-CM

## 2013-12-21 DIAGNOSIS — Z9119 Patient's noncompliance with other medical treatment and regimen: Secondary | ICD-10-CM

## 2013-12-21 DIAGNOSIS — J449 Chronic obstructive pulmonary disease, unspecified: Secondary | ICD-10-CM | POA: Diagnosis present

## 2013-12-21 DIAGNOSIS — Z9981 Dependence on supplemental oxygen: Secondary | ICD-10-CM | POA: Diagnosis not present

## 2013-12-21 DIAGNOSIS — I509 Heart failure, unspecified: Secondary | ICD-10-CM | POA: Diagnosis present

## 2013-12-21 DIAGNOSIS — J984 Other disorders of lung: Secondary | ICD-10-CM

## 2013-12-21 DIAGNOSIS — R5381 Other malaise: Secondary | ICD-10-CM

## 2013-12-21 DIAGNOSIS — J441 Chronic obstructive pulmonary disease with (acute) exacerbation: Secondary | ICD-10-CM

## 2013-12-21 DIAGNOSIS — Z91199 Patient's noncompliance with other medical treatment and regimen due to unspecified reason: Secondary | ICD-10-CM | POA: Diagnosis not present

## 2013-12-21 DIAGNOSIS — J9620 Acute and chronic respiratory failure, unspecified whether with hypoxia or hypercapnia: Secondary | ICD-10-CM | POA: Diagnosis present

## 2013-12-21 DIAGNOSIS — J9601 Acute respiratory failure with hypoxia: Secondary | ICD-10-CM | POA: Diagnosis present

## 2013-12-21 DIAGNOSIS — J189 Pneumonia, unspecified organism: Secondary | ICD-10-CM | POA: Diagnosis present

## 2013-12-21 DIAGNOSIS — E781 Pure hyperglyceridemia: Secondary | ICD-10-CM

## 2013-12-21 DIAGNOSIS — G4733 Obstructive sleep apnea (adult) (pediatric): Secondary | ICD-10-CM | POA: Diagnosis present

## 2013-12-21 DIAGNOSIS — F329 Major depressive disorder, single episode, unspecified: Secondary | ICD-10-CM | POA: Diagnosis present

## 2013-12-21 DIAGNOSIS — I5032 Chronic diastolic (congestive) heart failure: Secondary | ICD-10-CM | POA: Diagnosis present

## 2013-12-21 DIAGNOSIS — F17200 Nicotine dependence, unspecified, uncomplicated: Secondary | ICD-10-CM

## 2013-12-21 DIAGNOSIS — J181 Lobar pneumonia, unspecified organism: Secondary | ICD-10-CM

## 2013-12-21 DIAGNOSIS — B029 Zoster without complications: Secondary | ICD-10-CM

## 2013-12-21 DIAGNOSIS — F3289 Other specified depressive episodes: Secondary | ICD-10-CM | POA: Diagnosis present

## 2013-12-21 DIAGNOSIS — Z8541 Personal history of malignant neoplasm of cervix uteri: Secondary | ICD-10-CM

## 2013-12-21 DIAGNOSIS — D492 Neoplasm of unspecified behavior of bone, soft tissue, and skin: Secondary | ICD-10-CM

## 2013-12-21 DIAGNOSIS — G4730 Sleep apnea, unspecified: Secondary | ICD-10-CM | POA: Diagnosis present

## 2013-12-21 DIAGNOSIS — E870 Hyperosmolality and hypernatremia: Secondary | ICD-10-CM

## 2013-12-21 DIAGNOSIS — K219 Gastro-esophageal reflux disease without esophagitis: Secondary | ICD-10-CM

## 2013-12-21 DIAGNOSIS — E785 Hyperlipidemia, unspecified: Secondary | ICD-10-CM | POA: Diagnosis present

## 2013-12-21 DIAGNOSIS — F411 Generalized anxiety disorder: Secondary | ICD-10-CM | POA: Diagnosis present

## 2013-12-21 DIAGNOSIS — Z8659 Personal history of other mental and behavioral disorders: Secondary | ICD-10-CM

## 2013-12-21 DIAGNOSIS — R0600 Dyspnea, unspecified: Secondary | ICD-10-CM

## 2013-12-21 DIAGNOSIS — Z79899 Other long term (current) drug therapy: Secondary | ICD-10-CM | POA: Diagnosis not present

## 2013-12-21 DIAGNOSIS — IMO0001 Reserved for inherently not codable concepts without codable children: Secondary | ICD-10-CM

## 2013-12-21 DIAGNOSIS — L439 Lichen planus, unspecified: Secondary | ICD-10-CM

## 2013-12-21 DIAGNOSIS — M545 Low back pain, unspecified: Secondary | ICD-10-CM

## 2013-12-21 DIAGNOSIS — J4489 Other specified chronic obstructive pulmonary disease: Secondary | ICD-10-CM

## 2013-12-21 DIAGNOSIS — G8220 Paraplegia, unspecified: Secondary | ICD-10-CM | POA: Diagnosis present

## 2013-12-21 DIAGNOSIS — R410 Disorientation, unspecified: Secondary | ICD-10-CM

## 2013-12-21 DIAGNOSIS — R0602 Shortness of breath: Secondary | ICD-10-CM | POA: Diagnosis present

## 2013-12-21 DIAGNOSIS — R339 Retention of urine, unspecified: Secondary | ICD-10-CM

## 2013-12-21 DIAGNOSIS — S22000A Wedge compression fracture of unspecified thoracic vertebra, initial encounter for closed fracture: Secondary | ICD-10-CM

## 2013-12-21 DIAGNOSIS — R0689 Other abnormalities of breathing: Secondary | ICD-10-CM

## 2013-12-21 DIAGNOSIS — J9600 Acute respiratory failure, unspecified whether with hypoxia or hypercapnia: Secondary | ICD-10-CM | POA: Diagnosis present

## 2013-12-21 LAB — I-STAT CHEM 8, ED
BUN: 53 mg/dL — ABNORMAL HIGH (ref 6–23)
CALCIUM ION: 1.05 mmol/L — AB (ref 1.12–1.23)
CHLORIDE: 96 meq/L (ref 96–112)
Creatinine, Ser: 1.2 mg/dL — ABNORMAL HIGH (ref 0.50–1.10)
Glucose, Bld: 453 mg/dL — ABNORMAL HIGH (ref 70–99)
HEMATOCRIT: 41 % (ref 36.0–46.0)
Hemoglobin: 13.9 g/dL (ref 12.0–15.0)
Potassium: 4.5 mEq/L (ref 3.7–5.3)
Sodium: 131 mEq/L — ABNORMAL LOW (ref 137–147)
TCO2: 30 mmol/L (ref 0–100)

## 2013-12-21 LAB — I-STAT ARTERIAL BLOOD GAS, ED
Acid-Base Excess: 6 mmol/L — ABNORMAL HIGH (ref 0.0–2.0)
Bicarbonate: 32.8 mEq/L — ABNORMAL HIGH (ref 20.0–24.0)
O2 Saturation: 96 %
PH ART: 7.374 (ref 7.350–7.450)
TCO2: 35 mmol/L (ref 0–100)
pCO2 arterial: 56.3 mmHg — ABNORMAL HIGH (ref 35.0–45.0)
pO2, Arterial: 85 mmHg (ref 80.0–100.0)

## 2013-12-21 LAB — CBC WITH DIFFERENTIAL/PLATELET
Basophils Absolute: 0.1 10*3/uL (ref 0.0–0.1)
Basophils Relative: 1 % (ref 0–1)
Eosinophils Absolute: 0 10*3/uL (ref 0.0–0.7)
Eosinophils Relative: 0 % (ref 0–5)
HEMATOCRIT: 39.8 % (ref 36.0–46.0)
HEMOGLOBIN: 13.3 g/dL (ref 12.0–15.0)
LYMPHS PCT: 7 % — AB (ref 12–46)
Lymphs Abs: 0.4 10*3/uL — ABNORMAL LOW (ref 0.7–4.0)
MCH: 33.5 pg (ref 26.0–34.0)
MCHC: 33.4 g/dL (ref 30.0–36.0)
MCV: 100.3 fL — ABNORMAL HIGH (ref 78.0–100.0)
MONOS PCT: 5 % (ref 3–12)
Monocytes Absolute: 0.3 10*3/uL (ref 0.1–1.0)
NEUTROS ABS: 5.1 10*3/uL (ref 1.7–7.7)
NEUTROS PCT: 87 % — AB (ref 43–77)
Platelets: DECREASED 10*3/uL (ref 150–400)
RBC: 3.97 MIL/uL (ref 3.87–5.11)
RDW: 16.8 % — AB (ref 11.5–15.5)
WBC: 5.9 10*3/uL (ref 4.0–10.5)

## 2013-12-21 LAB — URINE MICROSCOPIC-ADD ON

## 2013-12-21 LAB — URINALYSIS, ROUTINE W REFLEX MICROSCOPIC
Bilirubin Urine: NEGATIVE
KETONES UR: NEGATIVE mg/dL
NITRITE: POSITIVE — AB
PROTEIN: 30 mg/dL — AB
Specific Gravity, Urine: 1.023 (ref 1.005–1.030)
Urobilinogen, UA: 0.2 mg/dL (ref 0.0–1.0)
pH: 6 (ref 5.0–8.0)

## 2013-12-21 LAB — I-STAT TROPONIN, ED: TROPONIN I, POC: 0.01 ng/mL (ref 0.00–0.08)

## 2013-12-21 LAB — PRO B NATRIURETIC PEPTIDE: PRO B NATRI PEPTIDE: 502.4 pg/mL — AB (ref 0–125)

## 2013-12-21 LAB — I-STAT CG4 LACTIC ACID, ED: LACTIC ACID, VENOUS: 3.14 mmol/L — AB (ref 0.5–2.2)

## 2013-12-21 LAB — D-DIMER, QUANTITATIVE (NOT AT ARMC): D-Dimer, Quant: 0.97 ug/mL-FEU — ABNORMAL HIGH (ref 0.00–0.48)

## 2013-12-21 MED ORDER — FUROSEMIDE 10 MG/ML IJ SOLN
40.0000 mg | Freq: Every day | INTRAMUSCULAR | Status: DC
  Administered 2013-12-22 – 2013-12-23 (×3): 40 mg via INTRAVENOUS
  Filled 2013-12-21 (×3): qty 4

## 2013-12-21 MED ORDER — SIMETHICONE 80 MG PO CHEW
80.0000 mg | CHEWABLE_TABLET | Freq: Four times a day (QID) | ORAL | Status: DC
  Administered 2013-12-22 – 2013-12-23 (×6): 80 mg via ORAL
  Filled 2013-12-21 (×9): qty 1

## 2013-12-21 MED ORDER — DEXTROSE 5 % IV SOLN
1.0000 g | Freq: Two times a day (BID) | INTRAVENOUS | Status: DC
  Administered 2013-12-21 – 2013-12-23 (×4): 1 g via INTRAVENOUS
  Filled 2013-12-21 (×5): qty 1

## 2013-12-21 MED ORDER — PANTOPRAZOLE SODIUM 40 MG PO TBEC
40.0000 mg | DELAYED_RELEASE_TABLET | Freq: Two times a day (BID) | ORAL | Status: DC
  Administered 2013-12-22 – 2013-12-23 (×4): 40 mg via ORAL
  Filled 2013-12-21 (×4): qty 1

## 2013-12-21 MED ORDER — LORATADINE 10 MG PO TABS
10.0000 mg | ORAL_TABLET | Freq: Every day | ORAL | Status: DC
  Administered 2013-12-22 (×2): 10 mg via ORAL
  Filled 2013-12-21 (×3): qty 1

## 2013-12-21 MED ORDER — DM-GUAIFENESIN ER 30-600 MG PO TB12
1.0000 | ORAL_TABLET | Freq: Two times a day (BID) | ORAL | Status: DC
  Administered 2013-12-22 – 2013-12-23 (×4): 1 via ORAL
  Filled 2013-12-21 (×5): qty 1

## 2013-12-21 MED ORDER — POLYETHYLENE GLYCOL 3350 17 G PO PACK
17.0000 g | PACK | Freq: Three times a day (TID) | ORAL | Status: DC
  Administered 2013-12-22: 17 g via ORAL
  Filled 2013-12-21 (×6): qty 1

## 2013-12-21 MED ORDER — DULOXETINE HCL 60 MG PO CPEP
60.0000 mg | ORAL_CAPSULE | Freq: Two times a day (BID) | ORAL | Status: DC
  Administered 2013-12-22 – 2013-12-23 (×4): 60 mg via ORAL
  Filled 2013-12-21 (×5): qty 1

## 2013-12-21 MED ORDER — TRAZODONE HCL 150 MG PO TABS
150.0000 mg | ORAL_TABLET | Freq: Every day | ORAL | Status: DC
  Administered 2013-12-22: 150 mg via ORAL
  Filled 2013-12-21 (×3): qty 1

## 2013-12-21 MED ORDER — VANCOMYCIN HCL IN DEXTROSE 750-5 MG/150ML-% IV SOLN
750.0000 mg | Freq: Two times a day (BID) | INTRAVENOUS | Status: DC
  Administered 2013-12-21 – 2013-12-22 (×2): 750 mg via INTRAVENOUS
  Filled 2013-12-21 (×3): qty 150

## 2013-12-21 MED ORDER — PREGABALIN 100 MG PO CAPS
100.0000 mg | ORAL_CAPSULE | Freq: Three times a day (TID) | ORAL | Status: DC
  Administered 2013-12-22 – 2013-12-23 (×5): 100 mg via ORAL
  Filled 2013-12-21 (×6): qty 1

## 2013-12-21 MED ORDER — ACETAMINOPHEN 650 MG RE SUPP: 650.0000 mg | Freq: Four times a day (QID) | RECTAL | Status: DC | PRN

## 2013-12-21 MED ORDER — METHYLPREDNISOLONE SODIUM SUCC 125 MG IJ SOLR
125.0000 mg | Freq: Once | INTRAMUSCULAR | Status: AC
  Administered 2013-12-21: 125 mg via INTRAVENOUS
  Filled 2013-12-21: qty 2

## 2013-12-21 MED ORDER — INSULIN ASPART 100 UNIT/ML ~~LOC~~ SOLN
0.0000 [IU] | Freq: Three times a day (TID) | SUBCUTANEOUS | Status: DC
Start: 2013-12-22 — End: 2013-12-22
  Administered 2013-12-22 (×2): 9 [IU] via SUBCUTANEOUS

## 2013-12-21 MED ORDER — TAMSULOSIN HCL 0.4 MG PO CAPS
0.4000 mg | ORAL_CAPSULE | Freq: Every day | ORAL | Status: DC
  Administered 2013-12-22: 0.4 mg via ORAL
  Filled 2013-12-21 (×2): qty 1

## 2013-12-21 MED ORDER — OXYCODONE HCL 5 MG PO TABS
5.0000 mg | ORAL_TABLET | Freq: Four times a day (QID) | ORAL | Status: DC | PRN
  Administered 2013-12-22 – 2013-12-23 (×3): 5 mg via ORAL
  Filled 2013-12-21 (×3): qty 1

## 2013-12-21 MED ORDER — SODIUM CHLORIDE 0.9 % IJ SOLN
3.0000 mL | Freq: Two times a day (BID) | INTRAMUSCULAR | Status: DC
  Administered 2013-12-22 (×2): 3 mL via INTRAVENOUS

## 2013-12-21 MED ORDER — LINACLOTIDE 145 MCG PO CAPS
145.0000 ug | ORAL_CAPSULE | Freq: Every day | ORAL | Status: DC
  Administered 2013-12-22 – 2013-12-23 (×2): 145 ug via ORAL
  Filled 2013-12-21 (×2): qty 1

## 2013-12-21 MED ORDER — ACETAMINOPHEN 325 MG PO TABS: 650.0000 mg | ORAL_TABLET | Freq: Four times a day (QID) | ORAL | Status: DC | PRN

## 2013-12-21 MED ORDER — LEVOTHYROXINE SODIUM 150 MCG PO TABS
150.0000 ug | ORAL_TABLET | Freq: Every day | ORAL | Status: DC
  Administered 2013-12-22 – 2013-12-23 (×2): 150 ug via ORAL
  Filled 2013-12-21 (×3): qty 1

## 2013-12-21 MED ORDER — VITAMIN B-12 1000 MCG PO TABS
5000.0000 ug | ORAL_TABLET | Freq: Every day | ORAL | Status: DC
  Administered 2013-12-22 – 2013-12-23 (×2): 5000 ug via ORAL
  Filled 2013-12-21 (×3): qty 5

## 2013-12-21 MED ORDER — FERROUS SULFATE 325 (65 FE) MG PO TABS
325.0000 mg | ORAL_TABLET | Freq: Three times a day (TID) | ORAL | Status: DC
  Administered 2013-12-22 – 2013-12-23 (×5): 325 mg via ORAL
  Filled 2013-12-21 (×7): qty 1

## 2013-12-21 MED ORDER — ALPRAZOLAM 0.5 MG PO TABS
0.5000 mg | ORAL_TABLET | Freq: Three times a day (TID) | ORAL | Status: DC
  Administered 2013-12-22 – 2013-12-23 (×3): 0.5 mg via ORAL
  Filled 2013-12-21 (×4): qty 1

## 2013-12-21 MED ORDER — INSULIN GLARGINE 100 UNIT/ML ~~LOC~~ SOLN
5.0000 [IU] | Freq: Once | SUBCUTANEOUS | Status: AC
  Administered 2013-12-22: 5 [IU] via SUBCUTANEOUS
  Filled 2013-12-21: qty 0.05

## 2013-12-21 MED ORDER — DILTIAZEM HCL ER BEADS 300 MG PO CP24
300.0000 mg | ORAL_CAPSULE | Freq: Every day | ORAL | Status: DC
  Administered 2013-12-22 – 2013-12-23 (×2): 300 mg via ORAL
  Filled 2013-12-21 (×2): qty 1

## 2013-12-21 MED ORDER — ONDANSETRON HCL 4 MG/2ML IJ SOLN: 4.0000 mg | Freq: Four times a day (QID) | INTRAMUSCULAR | Status: DC | PRN

## 2013-12-21 MED ORDER — ONDANSETRON HCL 4 MG PO TABS: 4.0000 mg | ORAL_TABLET | Freq: Four times a day (QID) | ORAL | Status: DC | PRN

## 2013-12-21 MED ORDER — ALBUTEROL (5 MG/ML) CONTINUOUS INHALATION SOLN
10.0000 mg/h | INHALATION_SOLUTION | Freq: Once | RESPIRATORY_TRACT | Status: AC
  Administered 2013-12-21: 10 mg/h via RESPIRATORY_TRACT
  Filled 2013-12-21: qty 20

## 2013-12-21 MED ORDER — SODIUM CHLORIDE 0.9 % IJ SOLN
3.0000 mL | Freq: Two times a day (BID) | INTRAMUSCULAR | Status: DC
  Administered 2013-12-22 – 2013-12-23 (×3): 3 mL via INTRAVENOUS

## 2013-12-21 NOTE — ED Notes (Signed)
Patient is resting comfortably. 

## 2013-12-21 NOTE — H&P (Signed)
[facility] History and Physical  [name] RCV:[id number] DOB: 04/17/1961 DOA: 12/21/2013  Referring physician: ER physician. PCP: [name], NP   Chief Complaint: Shortness of breath.  HPI: [name] is a 53 y.o. female with history of paraplegia and recent T8 epidural mass removal, subsequently was transferred to rehabilitation and was complicated by pneumonia and was discharged on Levaquin and presently is on tapering dose of Decadron was brought to the ER because of shortness of breath. Patient states that over the last 3 days patient has been having shortness of breath with cough and pleuritic-type of chest pain. In the ER patient was found to be hypoxic and was initially placed on BiPAP. Chest x-ray shows pneumonic process with pleural effusion. Patient also has been having epigastric pain. Denies any nausea vomiting. Patient has Foley catheter. Troponins are negative EKG was showing sinus tachycardia. Patient has been started on empiric antibiotics and admitted for further workup for acute respiratory failure probably from pneumonia.   Review of Systems: As presented in the history of presenting illness, rest negative.  Past Medical History  Diagnosis Date  . Right-sided heart failure   . COPD (chronic obstructive pulmonary disease)   . Emphysema   . Seizures   . Thyroid disease   . Depression   . Emphysema   . Arthritis     back and legs  . Blood transfusion without reported diagnosis   . High cholesterol   . Anxiety   . Nerves   . Panic attack   . Thyroid disorder   . Hypothyroidism   . Pneumonia   . CHF (congestive heart failure)   . On home O2     4L N/C chronic  . Fibromyalgia    Past Surgical History  Procedure Laterality Date  . Abdominal hysterectomy    . Cesarean section    . Cholecystectomy    . Laser removal condyloma    . Laminectomy N/A 11/12/2013    Procedure: THORACIC LAMINECTOMY FOR MASS;  Surgeon: Eustace Moore, MD;  Location:  [place] [facility];  Service: Neurosurgery;  Laterality: N/A;   Social History:  reports that she quit smoking about 4 months ago. She has never used smokeless tobacco. She reports that she does not drink alcohol or use illicit drugs. Where does patient live home. Can patient participate in ADLs? No.  Allergies  Allergen Reactions  . Tape Itching and Rash    Family History:  Family History  Problem Relation Age of Onset  . Cancer Father       Prior to Admission medications   Medication Sig Start Date End Date Taking? Authorizing Provider  acetaminophen (TYLENOL) 325 MG tablet Take 2 tablets (650 mg total) by mouth every 6 (six) hours as needed for mild pain (or Fever >/= 101). 11/16/13   Kelvin Cellar, MD  acyclovir (ZOVIRAX) 800 MG tablet Take 1 tablet (800 mg total) by mouth 2 (two) times daily. 11/16/13   Kelvin Cellar, MD  ALPRAZolam Duanne Moron) 0.5 MG tablet Take 0.5 mg by mouth 3 (three) times daily.     Historical Provider, MD  antiseptic oral rinse (BIOTENE) LIQD 15 mLs by Mouth Rinse route QID. 12/09/13   Ivan Anchors Love, PA-C  Calcium Carb-Cholecalciferol (CALCIUM 600 + D PO) Take 1 tablet by mouth 2 (two) times daily.    Historical Provider, MD  Cyanocobalamin (VITAMIN B-12) 5000 MCG SUBL Place 5,000 mcg under the tongue daily.    Historical Provider, MD  dexamethasone (  DECADRON) 4 MG tablet Take one pill daily for 7 days.  Then decrease to 1/2 pill daily till gone 12/09/13   Ivan Anchors Love, PA-C  dextromethorphan-guaiFENesin [facility] DM) 30-600 MG per 12 hr tablet Take 1 tablet by mouth 2 (two) times daily. 12/09/13   Bary Leriche, PA-C  diltiazem (TIAZAC) 300 MG 24 hr capsule Take 1 capsule (300 mg total) by mouth daily. 12/12/13   Tiffany L Reed, DO  DULoxetine (CYMBALTA) 60 MG capsule Take 60 mg by mouth 2 (two) times daily.    Historical Provider, MD  ferrous sulfate 325 (65 FE) MG EC tablet Take 1 tablet (325 mg total) by mouth daily. 12/09/13   Bary Leriche, PA-C   Fluticasone-Salmeterol (ADVAIR) 250-50 MCG/DOSE AEPB Inhale 1 puff into the lungs every 12 (twelve) hours.    Historical Provider, MD  furosemide (LASIX) 40 MG tablet Take 40 mg by mouth daily.    Historical Provider, MD  levalbuterol Penne Lash) 0.63 MG/3ML nebulizer solution Take 3 mLs (0.63 mg total) by nebulization every 4 (four) hours as needed for wheezing or shortness of breath. 12/09/13   Bary Leriche, PA-C  levofloxacin (LEVAQUIN) 750 MG tablet Take 1 tablet (750 mg total) by mouth daily. 12/09/13   Bary Leriche, PA-C  levothyroxine (SYNTHROID, LEVOTHROID) 150 MCG tablet Take 150 mcg by mouth daily.    Historical Provider, MD  Linaclotide Rolan Lipa) 145 MCG CAPS capsule Take 145 mcg by mouth daily.    Historical Provider, MD  loratadine (CLARITIN) 10 MG tablet Take 10 mg by mouth at bedtime.    Historical Provider, MD  Maltodextrin-Xanthan Gum (RESOURCE THICKENUP CLEAR) POWD Take 1 g by mouth as needed. 12/09/13   Bary Leriche, PA-C  Menthol-Methyl Salicylate (MUSCLE RUB) 10-15 % CREA Apply 1 application topically as needed for muscle pain. 12/09/13   Bary Leriche, PA-C  nicotine (NICODERM CQ - DOSED IN MG/24 HOURS) 14 mg/24hr patch Place 1 patch (14 mg total) onto the skin at bedtime. 11/16/13   Kelvin Cellar, MD  oxyCODONE (OXY IR/ROXICODONE) 5 MG immediate release tablet Take 1 tablet (5 mg total) by mouth every 6 (six) hours as needed for severe pain. 12/09/13   Bary Leriche, PA-C  pantoprazole (PROTONIX) 40 MG tablet Take 1 tablet (40 mg total) by mouth 2 (two) times daily. 06/27/13   Tiffany L Reed, DO  polyethylene glycol (MIRALAX / GLYCOLAX) packet Take 17 g by mouth 3 (three) times daily. For constipation 12/09/13   Ivan Anchors Love, PA-C  pregabalin (LYRICA) 100 MG capsule Take 100 mg by mouth 3 (three) times daily.    Historical Provider, MD  simethicone (MYLICON) 40 [id number] drops Take 1.2 mLs (80 mg total) by mouth 4 (four) times daily. For gas and bloating. 12/09/13   Bary Leriche,  PA-C  tamsulosin (FLOMAX) 0.4 MG CAPS capsule Take 1 capsule (0.4 mg total) by mouth daily after supper. 11/16/13   Kelvin Cellar, MD  tiotropium (SPIRIVA) 18 MCG inhalation capsule Place 18 mcg into inhaler and inhale daily.    Historical Provider, MD  traZODone (DESYREL) 150 MG tablet Take 150 mg by mouth at bedtime.    Historical Provider, MD    Physical Exam: Filed Vitals:   12/21/13 2039 12/21/13 2115 12/21/13 2215 12/21/13 2230  BP: 119/76  116/66 115/65  Pulse: 114  101 103  Temp: 97.7 F (36.5 C)     TempSrc: Axillary     Resp: 19  16 17  Height:  5' 1.81" (1.57 m)    Weight:  71.7 kg (158 lb 1.1 oz)    SpO2: 98% 96% 98% 98%     General:  Well-developed well-nourished.  Eyes: Anicteric no pallor.  ENT: No discharge from the ears eyes nose mouth.  Neck: No mass felt.  Cardiovascular: S1-S2 heard.  Respiratory: No rhonchi or crepitations.  Abdomen: Soft nontender bowel sounds present.  Skin: No rash.  Musculoskeletal: No edema.  Psychiatric: Appears normal.  Neurologic: Alert awake oriented to time place and person. Paraplegic.  Labs on Admission:  Basic Metabolic Panel:  Recent Labs Lab 12/21/13 2058  NA 131*  K 4.5  CL 96  GLUCOSE 453*  BUN 53*  CREATININE 1.20*   Liver Function Tests: No results found for this basename: AST, ALT, ALKPHOS, BILITOT, PROT, ALBUMIN,  in the last 168 hours No results found for this basename: LIPASE, AMYLASE,  in the last 168 hours No results found for this basename: AMMONIA,  in the last 168 hours CBC:  Recent Labs Lab 12/21/13 2048 12/21/13 2058  WBC 5.9  --   NEUTROABS 5.1  --   HGB 13.3 13.9  HCT 39.8 41.0  MCV 100.3*  --   PLT PLATELET CLUMPS NOTED ON SMEAR, COUNT APPEARS DECREASED  --    Cardiac Enzymes: No results found for this basename: CKTOTAL, CKMB, CKMBINDEX, TROPONINI,  in the last 168 hours  BNP (last 3 results)  Recent Labs  09/02/13 1145 09/19/13 1310 12/21/13 2048  PROBNP 622.9*  506.1* 502.4*   CBG: No results found for this basename: GLUCAP,  in the last 168 hours  Radiological Exams on Admission: Dg Chest Port 1 View  12/21/2013   CLINICAL DATA:  Respiratory distress.  EXAM: PORTABLE CHEST - 1 VIEW  COMPARISON:  Chest radiograph performed 12/06/2013  FINDINGS: A small left pleural effusion is noted, with left basilar airspace opacity. Mild right basilar airspace opacities also seen. This may reflect persistent pneumonia, perhaps slightly improved from the prior study. No pneumothorax is seen.  The cardiomediastinal silhouette is borderline normal in size. No acute osseous abnormalities are identified.  IMPRESSION: Small left pleural effusion, with bibasilar airspace opacities. This may reflect persistent pneumonia, perhaps slightly improved from the prior study.   Electronically Signed   By: Garald Balding M.D.   On: 12/21/2013 21:30    EKG: Independently reviewed. Sinus tachycardia.  Assessment/Plan Principal Problem:   Acute respiratory failure with hypoxia Active Problems:   Chronic diastolic congestive heart failure, NYHA class 1   Pneumonia   COPD (chronic obstructive pulmonary disease)   Hyperglycemia   Paraplegia   Acute respiratory failure   1. Acute respiratory failure with hypoxia - probably from pneumonia. Patient has been placed on vancomycin and cefepime. Follow cultures. Patient also has history of diastolic CHF last EF measured was 65-70% in January of this year. Patient is on Lasix. Closely follow intake output and metabolic panel. Patient will be closely observed in step down overnight. On arrival as per the ER physician patient was mildly wheezing. ER physician has ordered a VQ scan which is still pending. 2. COPD on 4 L of oxygen - presently patient is on Pulmicort and nebulizer. Patient also on IV steroids. 3. Paraplegia with recent surgery for T8 Epidural lesion - on tapering dose of steroids. Presently on IV Solu-Medrol. 4. Hyperglycemia -  probably from steroids. I have placed patient on one dose of Lantus and sliding scale coverage. Check hemoglobin A1c. If blood  sugars are still elevated then may need IV insulin infusion. 5. Chronic kidney disease baseline creatinine is around 1.8 - closely follow intake output.   I have ordered repeat CBC as patient's initial CBC shows reduced platelet count clumps. Since patient has been complaining of epigastric pain we will check LFTs lipase and cycle cardiac markers. Check UA. CT abdomen is pending.    Code Status: Full code.  Family Communication: None.  Disposition Plan: Admit to inpatient.    [facility] Pager [phone].  If 7PM-7AM, please contact night-coverage [email or web address] Password [facility] 12/21/2013, 11:12 PM

## 2013-12-21 NOTE — ED Provider Notes (Signed)
CSN: [id number]     Arrival date & time 12/21/13  2008 History   First MD Initiated Contact with Patient 12/21/13 2015     Chief Complaint  Patient presents with  . Respiratory Distress      HPI  Patient presents in respiratory distress via EMS receiving CPAP.  Complicated history involving a prolonged hospitalization and a thoracic decompression surgery resulting in paraplegia. Discharge less than 2 weeks ago. Had hospital-acquired pneumonia upon discharge. Remains dyspneic at baseline. However for the last 48 hours has had worsening. 80 percent saturations at home tonight. Transferred via EMS. Not rapidly improving her saturations on O2 via mask was placed on CPAP. Improved to the mid 90s. No loss of consciousness home. No seizures. No risk for aspiration.Marland Kitchen Has not been febrile. Has not had pain.  Past Medical History  Diagnosis Date  . Right-sided heart failure   . COPD (chronic obstructive pulmonary disease)   . Emphysema   . Seizures   . Thyroid disease   . Depression   . Emphysema   . Arthritis     back and legs  . Blood transfusion without reported diagnosis   . High cholesterol   . Anxiety   . Nerves   . Panic attack   . Thyroid disorder   . Hypothyroidism   . Pneumonia   . CHF (congestive heart failure)   . On home O2     4L N/C chronic  . Fibromyalgia    Past Surgical History  Procedure Laterality Date  . Abdominal hysterectomy    . Cesarean section    . Cholecystectomy    . Laser removal condyloma    . Laminectomy N/A 11/12/2013    Procedure: THORACIC LAMINECTOMY FOR MASS;  Surgeon: Eustace Moore, MD;  Location: [facility];  Service: Neurosurgery;  Laterality: N/A;   Family History  Problem Relation Age of Onset  . Cancer Father    History  Substance Use Topics  . Smoking status: Former Smoker    Quit date: 08/02/2013  . Smokeless tobacco: Never Used  . Alcohol Use: No   OB History   Grav Para Term Preterm Abortions TAB SAB Ect Mult Living   1 1  1       1      Review of Systems  Constitutional: Positive for fatigue. Negative for fever, chills, diaphoresis and appetite change.  HENT: Negative for mouth sores, sore throat and trouble swallowing.   Eyes: Negative for visual disturbance.  Respiratory: Positive for cough, chest tightness, shortness of breath and wheezing.   Cardiovascular: Negative for chest pain.  Gastrointestinal: Negative for nausea, vomiting, abdominal pain, diarrhea and abdominal distention.  Endocrine: Negative for polydipsia, polyphagia and polyuria.  Genitourinary: Negative for dysuria, frequency and hematuria.  Musculoskeletal: Negative for gait problem.  Skin: Negative for color change, pallor and rash.  Neurological: Negative for dizziness, syncope, light-headedness and headaches.  Hematological: Does not bruise/bleed easily.  Psychiatric/Behavioral: Negative for behavioral problems and confusion.      Allergies  Tape  Home Medications   Prior to Admission medications   Medication Sig Start Date End Date Taking? Authorizing Provider  acetaminophen (TYLENOL) 325 MG tablet Take 2 tablets (650 mg total) by mouth every 6 (six) hours as needed for mild pain (or Fever >/= 101). 11/16/13   Kelvin Cellar, MD  acyclovir (ZOVIRAX) 800 MG tablet Take 1 tablet (800 mg total) by mouth 2 (two) times daily. 11/16/13   Kelvin Cellar, MD  ALPRAZolam (  XANAX) 0.5 MG tablet Take 0.5 mg by mouth 3 (three) times daily.     Historical Provider, MD  antiseptic oral rinse (BIOTENE) LIQD 15 mLs by Mouth Rinse route QID. 12/09/13   Ivan Anchors Love, PA-C  Calcium Carb-Cholecalciferol (CALCIUM 600 + D PO) Take 1 tablet by mouth 2 (two) times daily.    Historical Provider, MD  Cyanocobalamin (VITAMIN B-12) 5000 MCG SUBL Place 5,000 mcg under the tongue daily.    Historical Provider, MD  dexamethasone (DECADRON) 4 MG tablet Take one pill daily for 7 days.  Then decrease to 1/2 pill daily till gone 12/09/13   Ivan Anchors Love, PA-C   dextromethorphan-guaiFENesin [facility] DM) 30-600 MG per 12 hr tablet Take 1 tablet by mouth 2 (two) times daily. 12/09/13   Bary Leriche, PA-C  diltiazem (TIAZAC) 300 MG 24 hr capsule Take 1 capsule (300 mg total) by mouth daily. 12/12/13   Tiffany L Reed, DO  DULoxetine (CYMBALTA) 60 MG capsule Take 60 mg by mouth 2 (two) times daily.    Historical Provider, MD  ferrous sulfate 325 (65 FE) MG EC tablet Take 1 tablet (325 mg total) by mouth daily. 12/09/13   Bary Leriche, PA-C  Fluticasone-Salmeterol (ADVAIR) 250-50 MCG/DOSE AEPB Inhale 1 puff into the lungs every 12 (twelve) hours.    Historical Provider, MD  furosemide (LASIX) 40 MG tablet Take 40 mg by mouth daily.    Historical Provider, MD  levalbuterol Penne Lash) 0.63 MG/3ML nebulizer solution Take 3 mLs (0.63 mg total) by nebulization every 4 (four) hours as needed for wheezing or shortness of breath. 12/09/13   Bary Leriche, PA-C  levofloxacin (LEVAQUIN) 750 MG tablet Take 1 tablet (750 mg total) by mouth daily. 12/09/13   Bary Leriche, PA-C  levothyroxine (SYNTHROID, LEVOTHROID) 150 MCG tablet Take 150 mcg by mouth daily.    Historical Provider, MD  Linaclotide Rolan Lipa) 145 MCG CAPS capsule Take 145 mcg by mouth daily.    Historical Provider, MD  loratadine (CLARITIN) 10 MG tablet Take 10 mg by mouth at bedtime.    Historical Provider, MD  Maltodextrin-Xanthan Gum (RESOURCE THICKENUP CLEAR) POWD Take 1 g by mouth as needed. 12/09/13   Bary Leriche, PA-C  Menthol-Methyl Salicylate (MUSCLE RUB) 10-15 % CREA Apply 1 application topically as needed for muscle pain. 12/09/13   Bary Leriche, PA-C  nicotine (NICODERM CQ - DOSED IN MG/24 HOURS) 14 mg/24hr patch Place 1 patch (14 mg total) onto the skin at bedtime. 11/16/13   Kelvin Cellar, MD  oxyCODONE (OXY IR/ROXICODONE) 5 MG immediate release tablet Take 1 tablet (5 mg total) by mouth every 6 (six) hours as needed for severe pain. 12/09/13   Bary Leriche, PA-C  pantoprazole (PROTONIX) 40 MG  tablet Take 1 tablet (40 mg total) by mouth 2 (two) times daily. 06/27/13   Tiffany L Reed, DO  polyethylene glycol (MIRALAX / GLYCOLAX) packet Take 17 g by mouth 3 (three) times daily. For constipation 12/09/13   Ivan Anchors Love, PA-C  pregabalin (LYRICA) 100 MG capsule Take 100 mg by mouth 3 (three) times daily.    Historical Provider, MD  simethicone (MYLICON) 40 CH/8.5ID drops Take 1.2 mLs (80 mg total) by mouth 4 (four) times daily. For gas and bloating. 12/09/13   Bary Leriche, PA-C  sodium phosphate (FLEET) 7-19 GM/118ML ENEM Place 133 mLs (1 enema total) rectally daily at 6 (six) AM. Available over the counter.  Needs to be done every morning.  12/09/13   Bary Leriche, PA-C  tamsulosin (FLOMAX) 0.4 MG CAPS capsule Take 1 capsule (0.4 mg total) by mouth daily after supper. 11/16/13   Kelvin Cellar, MD  tiotropium (SPIRIVA) 18 MCG inhalation capsule Place 18 mcg into inhaler and inhale daily.    Historical Provider, MD  traZODone (DESYREL) 150 MG tablet Take 150 mg by mouth at bedtime.    Historical Provider, MD   BP 125/73  Pulse 103  Temp(Src) 97.5 F (36.4 C) (Oral)  Resp 18  Ht 5' 1.81" (1.57 m)  Wt 158 lb 1.1 oz (71.7 kg)  BMI 29.09 kg/m2  SpO2 95% Physical Exam  Constitutional:  Dyspneic. Respiratory distress with tachypnea. CPAP.  HENT:  Conjunctiva not pale  Neck:  No JVD  Cardiovascular:  Anus rhythm on the monitor.  Pulmonary/Chest:  Diminished bibasilar breath sounds and prolongation.  Abdominal: Soft. Bowel sounds are normal.  Neurological:  Paraplegia to lower extremities. 1+ symmetric lower extremity edema.  Skin: Skin is warm and dry.  Psychiatric: She has a normal mood and affect. Her behavior is normal.    ED Course  Procedures (including critical care time) Labs Review Labs Reviewed  CBC WITH DIFFERENTIAL - Abnormal; Notable for the following:    MCV 100.3 (*)    RDW 16.8 (*)    Neutrophils Relative % 87 (*)    Lymphocytes Relative 7 (*)    Lymphs Abs  0.4 (*)    All other components within normal limits  PRO B NATRIURETIC PEPTIDE - Abnormal; Notable for the following:    Pro B Natriuretic peptide (BNP) 502.4 (*)    All other components within normal limits  HEPATIC FUNCTION PANEL - Abnormal; Notable for the following:    Total Protein 5.9 (*)    Albumin 2.2 (*)    ALT 43 (*)    Alkaline Phosphatase 139 (*)    Total Bilirubin <0.2 (*)    All other components within normal limits  URINALYSIS, ROUTINE W REFLEX MICROSCOPIC - Abnormal; Notable for the following:    APPearance CLOUDY (*)    Glucose, UA >1000 (*)    Hgb urine dipstick SMALL (*)    Protein, ur 30 (*)    Nitrite POSITIVE (*)    Leukocytes, UA SMALL (*)    All other components within normal limits  D-DIMER, QUANTITATIVE - Abnormal; Notable for the following:    D-Dimer, Quant 0.97 (*)    All other components within normal limits  BASIC METABOLIC PANEL - Abnormal; Notable for the following:    Sodium 135 (*)    Potassium 3.5 (*)    Chloride 91 (*)    Glucose, Bld 425 (*)    BUN 36 (*)    GFR calc non Af Amer 74 (*)    GFR calc Af Amer 86 (*)    All other components within normal limits  CBC WITH DIFFERENTIAL - Abnormal; Notable for the following:    RBC 3.76 (*)    RDW 16.7 (*)    Platelets 73 (*)    Neutrophils Relative % 86 (*)    Lymphocytes Relative 10 (*)    Lymphs Abs 0.6 (*)    All other components within normal limits  URINE MICROSCOPIC-ADD ON - Abnormal; Notable for the following:    Bacteria, UA MANY (*)    Casts GRANULAR CAST (*)    All other components within normal limits  COMPREHENSIVE METABOLIC PANEL - Abnormal; Notable for the following:    Sodium  135 (*)    Potassium 3.5 (*)    Chloride 92 (*)    Glucose, Bld 445 (*)    BUN 36 (*)    Total Protein 5.8 (*)    Albumin 2.1 (*)    ALT 42 (*)    Alkaline Phosphatase 135 (*)    Total Bilirubin <0.2 (*)    GFR calc non Af Amer 75 (*)    GFR calc Af Amer 87 (*)    All other components within  normal limits  CBC WITH DIFFERENTIAL - Abnormal; Notable for the following:    RBC 3.78 (*)    RDW 16.6 (*)    Platelets 71 (*)    All other components within normal limits  I-STAT CHEM 8, ED - Abnormal; Notable for the following:    Sodium 131 (*)    BUN 53 (*)    Creatinine, Ser 1.20 (*)    Glucose, Bld 453 (*)    Calcium, Ion 1.05 (*)    All other components within normal limits  I-STAT CG4 LACTIC ACID, ED - Abnormal; Notable for the following:    Lactic Acid, Venous 3.14 (*)    All other components within normal limits  I-STAT ARTERIAL BLOOD GAS, ED - Abnormal; Notable for the following:    pCO2 arterial 56.3 (*)    Bicarbonate 32.8 (*)    Acid-Base Excess 6.0 (*)    All other components within normal limits  CULTURE, BLOOD (ROUTINE X 2)  CULTURE, BLOOD (ROUTINE X 2)  MRSA PCR SCREENING  LIPASE, BLOOD  TROPONIN I  TROPONIN I  TSH  HEMOGLOBIN A1C  TROPONIN I  TROPONIN I  I-STAT TROPOININ, ED    Imaging Review Ct Abdomen Pelvis Wo Contrast  12/22/2013   CLINICAL DATA:  Abdominal pain; suspect hydronephrosis  EXAM: CT ABDOMEN AND PELVIS WITHOUT CONTRAST  TECHNIQUE: Multidetector CT imaging of the abdomen and pelvis was performed following the standard protocol without intravenous contrast.  COMPARISON:  DG ABD PORTABLE 1V dated 11/25/2013; CT ABD-PELV W/ CM dated 12/28/2012  FINDINGS: The kidneys are normal in contour. There is no hydronephrosis. No calcified stones are demonstrated. On the right there are stable hypodensities in the upper and lower poles compatible with cysts. Along the course of the ureters no stones are evident. The urinary bladder is decompressed with a Foley catheter.  There is a large stool burden throughout the colon. There is no evidence of a large or small bowel obstruction however. Considerable stool in the rectosigmoid is present containing contrast likely from previous barium study. A fecal impaction is not excluded.  The liver exhibits no focal mass or  ductal dilation. The gallbladder is surgically absent. The stomach is partially distended with food. The pancreas, spleen, and adrenal glands are normal in appearance. The caliber of the abdominal aorta is normal. No intra-abdominal or pelvic lymphadenopathy is demonstrated. The uterus is surgically absent. There are no adnexal masses. There is no inguinal nor umbilical hernia. Patient is undergone previous ORIF for a left hip fracture.  There is atelectasis at the left lung base and inferiorly in the right middle lobe. The lumbar vertebral bodies are preserved in height. The bony pelvis exhibits no acute abnormalities.  IMPRESSION: 1. There is no evidence of hydronephrosis nor of calcified stones. No perinephric inflammatory changes are demonstrated. Stable cysts in the right kidney are demonstrated. 2. A large stool burden is present throughout the colon which may reflect constipation. A fecal impaction is not excluded. There  is no evidence of colitis or enteritis or small bowel obstruction. 3. There is no acute abnormality demonstrated elsewhere within the abdomen or pelvis. 4. There is basilar atelectasis on the left and in the inferior aspect of the right middle lobe.   Electronically Signed   By: David  Martinique   On: 12/22/2013 00:06   Dg Chest Port 1 View  12/21/2013   CLINICAL DATA:  Respiratory distress.  EXAM: PORTABLE CHEST - 1 VIEW  COMPARISON:  Chest radiograph performed 12/06/2013  FINDINGS: A small left pleural effusion is noted, with left basilar airspace opacity. Mild right basilar airspace opacities also seen. This may reflect persistent pneumonia, perhaps slightly improved from the prior study. No pneumothorax is seen.  The cardiomediastinal silhouette is borderline normal in size. No acute osseous abnormalities are identified.  IMPRESSION: Small left pleural effusion, with bibasilar airspace opacities. This may reflect persistent pneumonia, perhaps slightly improved from the prior study.    Electronically Signed   By: Garald Balding M.D.   On: 12/21/2013 21:30     EKG Interpretation   Date/Time:  Wednesday December 21 2013 20:12:33 EDT Ventricular Rate:  115 PR Interval:  142 QRS Duration: 77 QT Interval:  320 QTC Calculation: 443 R Axis:   -22 Text Interpretation:  Sinus tachycardia Borderline left axis deviation Low  voltage, precordial leads Abnormal R-wave progression, late transition  Confirmed by Jeneen Rinks  MD, [place] ([id number]) on 12/22/2013 1:48:25 AM      MDM   Final diagnoses:  Healthcare-associated pneumonia   Patient with recent paraplegia periodically bedridden. Short of breath. X-rays show probable pneumonia and parapneumonic effusion. Does not have a creatinine for contrast for a pulmonary CT angiogram. Cultures obtained and given IV antibiotics. I discussed the case with the hospitalist. She will be admitted. VQ scan pending.    Tanna Furry, MD 12/22/13 [id number]

## 2013-12-21 NOTE — ED Notes (Signed)
Patient was seen here and discharged for the same problem.  She went to see her PCP and she was told by her PCP that she needed to come to [facility] and be seen.  The patient refused to come to the hospital, went home, ate dinner, smoked and her SOB became respiratory distress.  Her husband called EMS and they transported here to be evaluated.  According to EMS, patient was having trouble breathing so they put an IV, 20 Left FA and placed her on CPAP and transported here here to the ED.

## 2013-12-21 NOTE — Progress Notes (Signed)
ANTIBIOTIC CONSULT NOTE - INITIAL  Pharmacy Consult for Vancomycin, Cefepime  Indication: HCAP  Allergies  Allergen Reactions  . Tape Itching and Rash    Patient Measurements:   Adjusted Body Weight: n/a  Vital Signs: Temp: 97.7 F (36.5 C) (04/29 2039) Temp src: Axillary (04/29 2039) BP: 119/76 mmHg (04/29 2039) Pulse Rate: 114 (04/29 2039) Intake/Output from previous day:   Intake/Output from this shift:    Labs:  Recent Labs  12/21/13 2048 12/21/13 2058  WBC 5.9  --   HGB 13.3 13.9  PLT PLATELET CLUMPS NOTED ON SMEAR, COUNT APPEARS DECREASED  --   CREATININE  --  1.20*   The CrCl is unknown because both a height and weight (above a minimum accepted value) are required for this calculation. No results found for this basename: VANCOTROUGH, Corlis Leak, VANCORANDOM, GENTTROUGH, GENTPEAK, GENTRANDOM, TOBRATROUGH, TOBRAPEAK, TOBRARND, AMIKACINPEAK, AMIKACINTROU, AMIKACIN,  in the last 72 hours   Microbiology: Recent Results (from the past 720 hour(s))  URINE CULTURE     Status: None   Collection Time    11/21/13 10:28 PM      Result Value Ref Range Status   Specimen Description URINE, CATHETERIZED   Final   Special Requests NONE   Final   Culture  Setup Time     Final   Value: 11/21/2013 23:22     Performed at [place]     Final   Value: >=100,000 COLONIES/ML     Performed at [facility]   Culture     Final   Value: PSEUDOMONAS AERUGINOSA     ENTEROCOCCUS SPECIES     Performed at [facility]   Report Status 11/26/2013 FINAL   Final   Organism ID, Bacteria PSEUDOMONAS AERUGINOSA   Final   Organism ID, Bacteria ENTEROCOCCUS SPECIES   Final   Organism ID, Bacteria ENTEROCOCCUS SPECIES   Final  URINE CULTURE     Status: None   Collection Time    11/25/13 12:42 PM      Result Value Ref Range Status   Specimen Description URINE, CATHETERIZED   Final   Special Requests none   Final   Culture  Setup Time     Final   Value: 11/25/2013 18:19     Performed at [place]     Final   Value: >=100,000 COLONIES/ML     Performed at [facility]   Culture     Final   Value: Multiple bacterial morphotypes present, none predominant. Suggest appropriate recollection if clinically indicated.     Performed at [facility]   Report Status 11/26/2013 FINAL   Final  CULTURE, EXPECTORATED SPUTUM-ASSESSMENT     Status: None   Collection Time    12/07/13 12:15 PM      Result Value Ref Range Status   Specimen Description SPUTUM   Final   Special Requests Normal   Final   Sputum evaluation     Final   Value: THIS SPECIMEN IS ACCEPTABLE. RESPIRATORY CULTURE REPORT TO FOLLOW.   Report Status 12/07/2013 FINAL   Final  CULTURE, RESPIRATORY (NON-EXPECTORATED)     Status: None   Collection Time    12/07/13 12:15 PM      Result Value Ref Range Status   Specimen Description SPUTUM   Final   Special Requests NONE   Final   Gram Stain     Final   Value: ABUNDANT WBC  PRESENT, PREDOMINANTLY PMN     FEW SQUAMOUS EPITHELIAL CELLS PRESENT     FEW YEAST     RARE GRAM POSITIVE COCCI     IN PAIRS   Culture     Final   Value: FEW CANDIDA ALBICANS     Performed at [facility]   Report Status 12/09/2013 FINAL   Final    Medical History: Past Medical History  Diagnosis Date  . Right-sided heart failure   . COPD (chronic obstructive pulmonary disease)   . Emphysema   . Seizures   . Thyroid disease   . Depression   . Emphysema   . Arthritis     back and legs  . Blood transfusion without reported diagnosis   . High cholesterol   . Anxiety   . Nerves   . Panic attack   . Thyroid disorder   . Hypothyroidism   . Pneumonia   . CHF (congestive heart failure)   . On home O2     4L N/C chronic  . Fibromyalgia     Medications:   (Not in a hospital admission) Assessment: 22 YOF presented to the ED with respiratory distress. CXR in ED suspicious for bilateral pneumonia.  Pharmacy to start antibiotics for HCAP. WBC wnl, afebrile. CrCl ~ 51.2 mL/min.   Cultures:  4/29 Blood Cx x2>>   Goal of Therapy:  Vancomycin trough level 15-20 mcg/ml  Plan:  1) Start Vancomycin 750 mg IV Q 12 hours 2) Start Cefepime 1 gm IV Q 12 hours  3) Monitor CBC, renal fx, cultures and patient's clinical progress 4) Collect Vancomycin trough as indicated   Albertina Parr, PharmD.  Clinical Pharmacist Pager [phone]

## 2013-12-21 NOTE — ED Notes (Signed)
Respiratory at bedside obtaining ABG and patient trial off BiPap.

## 2013-12-22 ENCOUNTER — Inpatient Hospital Stay (HOSPITAL_COMMUNITY): Payer: Medicare Other

## 2013-12-22 DIAGNOSIS — F411 Generalized anxiety disorder: Secondary | ICD-10-CM

## 2013-12-22 DIAGNOSIS — G4730 Sleep apnea, unspecified: Secondary | ICD-10-CM

## 2013-12-22 DIAGNOSIS — E785 Hyperlipidemia, unspecified: Secondary | ICD-10-CM

## 2013-12-22 DIAGNOSIS — J189 Pneumonia, unspecified organism: Secondary | ICD-10-CM | POA: Diagnosis present

## 2013-12-22 DIAGNOSIS — Z8659 Personal history of other mental and behavioral disorders: Secondary | ICD-10-CM

## 2013-12-22 DIAGNOSIS — G8220 Paraplegia, unspecified: Secondary | ICD-10-CM

## 2013-12-22 DIAGNOSIS — J9620 Acute and chronic respiratory failure, unspecified whether with hypoxia or hypercapnia: Secondary | ICD-10-CM

## 2013-12-22 DIAGNOSIS — F17200 Nicotine dependence, unspecified, uncomplicated: Secondary | ICD-10-CM

## 2013-12-22 DIAGNOSIS — R7309 Other abnormal glucose: Secondary | ICD-10-CM

## 2013-12-22 DIAGNOSIS — R079 Chest pain, unspecified: Secondary | ICD-10-CM

## 2013-12-22 LAB — BASIC METABOLIC PANEL
BUN: 36 mg/dL — ABNORMAL HIGH (ref 6–23)
CO2: 30 mEq/L (ref 19–32)
Calcium: 8.5 mg/dL (ref 8.4–10.5)
Chloride: 91 mEq/L — ABNORMAL LOW (ref 96–112)
Creatinine, Ser: 0.89 mg/dL (ref 0.50–1.10)
GFR calc Af Amer: 86 mL/min — ABNORMAL LOW (ref >90)
GFR calc non Af Amer: 74 mL/min — ABNORMAL LOW (ref >90)
GLUCOSE: 425 mg/dL — AB (ref 70–99)
POTASSIUM: 3.5 meq/L — AB (ref 3.7–5.3)
Sodium: 135 mEq/L — ABNORMAL LOW (ref 137–147)

## 2013-12-22 LAB — TSH: TSH: 1.82 u[IU]/mL (ref 0.350–4.500)

## 2013-12-22 LAB — CBC WITH DIFFERENTIAL/PLATELET
BASOS ABS: 0 10*3/uL (ref 0.0–0.1)
Basophils Absolute: 0.1 10*3/uL (ref 0.0–0.1)
Basophils Absolute: 0.1 10*3/uL (ref 0.0–0.1)
Basophils Relative: 0 % (ref 0–1)
Basophils Relative: 1 % (ref 0–1)
Basophils Relative: 1 % (ref 0–1)
EOS ABS: 0 10*3/uL (ref 0.0–0.7)
EOS PCT: 0 % (ref 0–5)
Eosinophils Absolute: 0 10*3/uL (ref 0.0–0.7)
Eosinophils Absolute: 0 10*3/uL (ref 0.0–0.7)
Eosinophils Relative: 0 % (ref 0–5)
Eosinophils Relative: 0 % (ref 0–5)
HCT: 37.3 % (ref 36.0–46.0)
HCT: 36.1 % (ref 36.0–46.0)
HEMATOCRIT: 37.4 % (ref 36.0–46.0)
Hemoglobin: 12.1 g/dL (ref 12.0–15.0)
Hemoglobin: 12.6 g/dL (ref 12.0–15.0)
Hemoglobin: 12.8 g/dL (ref 12.0–15.0)
LYMPHS ABS: 0.6 10*3/uL — AB (ref 0.7–4.0)
Lymphocytes Relative: 10 % — ABNORMAL LOW (ref 12–46)
Lymphocytes Relative: 5 % — ABNORMAL LOW (ref 12–46)
Lymphocytes Relative: 8 % — ABNORMAL LOW (ref 12–46)
Lymphs Abs: 0.3 10*3/uL — ABNORMAL LOW (ref 0.7–4.0)
Lymphs Abs: 0.4 10*3/uL — ABNORMAL LOW (ref 0.7–4.0)
MCH: 33.9 pg (ref 26.0–34.0)
MCH: 33.1 pg (ref 26.0–34.0)
MCH: 33.5 pg (ref 26.0–34.0)
MCHC: 34.2 g/dL (ref 30.0–36.0)
MCHC: 33.5 g/dL (ref 30.0–36.0)
MCHC: 33.8 g/dL (ref 30.0–36.0)
MCV: 98.6 fL (ref 78.0–100.0)
MCV: 98.9 fL (ref 78.0–100.0)
MCV: 99.2 fL (ref 78.0–100.0)
MONO ABS: 0.2 10*3/uL (ref 0.1–1.0)
MONO ABS: 0.2 10*3/uL (ref 0.1–1.0)
MONO ABS: 0.3 10*3/uL (ref 0.1–1.0)
MONOS PCT: 3 % (ref 3–12)
MONOS PCT: 5 % (ref 3–12)
Monocytes Relative: 3 % (ref 3–12)
NEUTROS ABS: 5 10*3/uL (ref 1.7–7.7)
NEUTROS PCT: 86 % — AB (ref 43–77)
Neutro Abs: 4.8 10*3/uL (ref 1.7–7.7)
Neutro Abs: 4.9 10*3/uL (ref 1.7–7.7)
Neutrophils Relative %: 91 % — ABNORMAL HIGH (ref 43–77)
Neutrophils Relative %: 87 % — ABNORMAL HIGH (ref 43–77)
PLATELETS: 71 10*3/uL — AB (ref 150–400)
PLATELETS: 73 10*3/uL — AB (ref 150–400)
Platelets: 84 10*3/uL — ABNORMAL LOW (ref 150–400)
RBC: 3.66 MIL/uL — ABNORMAL LOW (ref 3.87–5.11)
RBC: 3.76 MIL/uL — AB (ref 3.87–5.11)
RBC: 3.78 MIL/uL — AB (ref 3.87–5.11)
RDW: 16.6 % — ABNORMAL HIGH (ref 11.5–15.5)
RDW: 16.7 % — ABNORMAL HIGH (ref 11.5–15.5)
RDW: 16.7 % — AB (ref 11.5–15.5)
Smear Review: DECREASED
WBC: 5.6 10*3/uL (ref 4.0–10.5)
WBC: 5.6 10*3/uL (ref 4.0–10.5)
WBC: 5.7 10*3/uL (ref 4.0–10.5)

## 2013-12-22 LAB — COMPREHENSIVE METABOLIC PANEL
ALK PHOS: 135 U/L — AB (ref 39–117)
ALT: 42 U/L — ABNORMAL HIGH (ref 0–35)
ALT: 39 U/L — ABNORMAL HIGH (ref 0–35)
AST: 13 U/L (ref 0–37)
AST: 20 U/L (ref 0–37)
Albumin: 2.1 g/dL — ABNORMAL LOW (ref 3.5–5.2)
Albumin: 2.3 g/dL — ABNORMAL LOW (ref 3.5–5.2)
Alkaline Phosphatase: 129 U/L — ABNORMAL HIGH (ref 39–117)
BUN: 36 mg/dL — ABNORMAL HIGH (ref 6–23)
BUN: 38 mg/dL — AB (ref 6–23)
CHLORIDE: 92 meq/L — AB (ref 96–112)
CO2: 27 mEq/L (ref 19–32)
CO2: 29 mEq/L (ref 19–32)
CREATININE: 0.85 mg/dL (ref 0.50–1.10)
CREATININE: 0.88 mg/dL (ref 0.50–1.10)
Calcium: 8.5 mg/dL (ref 8.4–10.5)
Calcium: 8.5 mg/dL (ref 8.4–10.5)
Chloride: 93 mEq/L — ABNORMAL LOW (ref 96–112)
GFR calc Af Amer: 87 mL/min — ABNORMAL LOW (ref >90)
GFR calc non Af Amer: 75 mL/min — ABNORMAL LOW (ref >90)
GFR calc non Af Amer: 78 mL/min — ABNORMAL LOW (ref >90)
GLUCOSE: 358 mg/dL — AB (ref 70–99)
Glucose, Bld: 445 mg/dL — ABNORMAL HIGH (ref 70–99)
POTASSIUM: 3.5 meq/L — AB (ref 3.7–5.3)
Potassium: 3.9 mEq/L (ref 3.7–5.3)
SODIUM: 136 meq/L — AB (ref 137–147)
Sodium: 135 mEq/L — ABNORMAL LOW (ref 137–147)
TOTAL PROTEIN: 6.1 g/dL (ref 6.0–8.3)
Total Bilirubin: <0.2 mg/dL — ABNORMAL LOW (ref 0.3–1.2)
Total Protein: 5.8 g/dL — ABNORMAL LOW (ref 6.0–8.3)

## 2013-12-22 LAB — HEMOGLOBIN A1C
HEMOGLOBIN A1C: 8.4 % — AB (ref <5.7)
HEMOGLOBIN A1C: 9 % — AB (ref <5.7)
MEAN PLASMA GLUCOSE: 212 mg/dL — AB (ref <117)
Mean Plasma Glucose: 194 mg/dL — ABNORMAL HIGH (ref <117)

## 2013-12-22 LAB — GLUCOSE, CAPILLARY
GLUCOSE-CAPILLARY: 290 mg/dL — AB (ref 70–99)
Glucose-Capillary: 403 mg/dL — ABNORMAL HIGH (ref 70–99)
Glucose-Capillary: 362 mg/dL — ABNORMAL HIGH (ref 70–99)
Glucose-Capillary: 258 mg/dL — ABNORMAL HIGH (ref 70–99)
Glucose-Capillary: 239 mg/dL — ABNORMAL HIGH (ref 70–99)

## 2013-12-22 LAB — LIPASE, BLOOD: LIPASE: 20 U/L (ref 11–59)

## 2013-12-22 LAB — HEPATIC FUNCTION PANEL
ALT: 43 U/L — ABNORMAL HIGH (ref 0–35)
AST: 20 U/L (ref 0–37)
Albumin: 2.2 g/dL — ABNORMAL LOW (ref 3.5–5.2)
Alkaline Phosphatase: 139 U/L — ABNORMAL HIGH (ref 39–117)
Total Protein: 5.9 g/dL — ABNORMAL LOW (ref 6.0–8.3)

## 2013-12-22 LAB — LIPID PANEL
CHOLESTEROL: 509 mg/dL — AB (ref 0–200)
HDL: 41 mg/dL (ref >39)
LDL Cholesterol: UNDETERMINED mg/dL (ref 0–99)
TRIGLYCERIDES: 890 mg/dL — AB (ref <150)
Total CHOL/HDL Ratio: 12.4 RATIO
VLDL: UNDETERMINED mg/dL (ref 0–40)

## 2013-12-22 LAB — MAGNESIUM: Magnesium: 2.2 mg/dL (ref 1.5–2.5)

## 2013-12-22 LAB — MRSA PCR SCREENING: MRSA BY PCR: NEGATIVE

## 2013-12-22 LAB — TROPONIN I
Troponin I: <0.3 ng/mL (ref <0.30)
Troponin I: <0.3 ng/mL (ref <0.30)

## 2013-12-22 MED ORDER — IPRATROPIUM-ALBUTEROL 0.5-2.5 (3) MG/3ML IN SOLN
3.0000 mL | Freq: Four times a day (QID) | RESPIRATORY_TRACT | Status: DC
  Administered 2013-12-22 – 2013-12-23 (×6): 3 mL via RESPIRATORY_TRACT
  Filled 2013-12-22 (×6): qty 3

## 2013-12-22 MED ORDER — ATORVASTATIN CALCIUM 20 MG PO TABS
20.0000 mg | ORAL_TABLET | Freq: Every day | ORAL | Status: DC
  Administered 2013-12-22: 20 mg via ORAL
  Filled 2013-12-22 (×2): qty 1

## 2013-12-22 MED ORDER — VANCOMYCIN HCL IN DEXTROSE 750-5 MG/150ML-% IV SOLN
750.0000 mg | INTRAVENOUS | Status: DC
  Administered 2013-12-23: 750 mg via INTRAVENOUS
  Filled 2013-12-22 (×2): qty 150

## 2013-12-22 MED ORDER — POTASSIUM CHLORIDE CRYS ER 20 MEQ PO TBCR
20.0000 meq | EXTENDED_RELEASE_TABLET | Freq: Every day | ORAL | Status: DC
  Administered 2013-12-22 – 2013-12-23 (×2): 20 meq via ORAL
  Filled 2013-12-22 (×2): qty 1

## 2013-12-22 MED ORDER — ALBUTEROL SULFATE (2.5 MG/3ML) 0.083% IN NEBU: 2.5000 mg | INHALATION_SOLUTION | RESPIRATORY_TRACT | Status: DC

## 2013-12-22 MED ORDER — INSULIN GLARGINE 100 UNIT/ML ~~LOC~~ SOLN
10.0000 [IU] | Freq: Every day | SUBCUTANEOUS | Status: DC
Start: 2013-12-22 — End: 2013-12-23
  Administered 2013-12-22 – 2013-12-23 (×2): 10 [IU] via SUBCUTANEOUS
  Filled 2013-12-22 (×2): qty 0.1

## 2013-12-22 MED ORDER — ALBUTEROL SULFATE (2.5 MG/3ML) 0.083% IN NEBU: 2.5000 mg | INHALATION_SOLUTION | RESPIRATORY_TRACT | Status: DC | PRN

## 2013-12-22 MED ORDER — INSULIN ASPART 100 UNIT/ML ~~LOC~~ SOLN
0.0000 [IU] | SUBCUTANEOUS | Status: DC
  Administered 2013-12-22: 11 [IU] via SUBCUTANEOUS
  Administered 2013-12-22: 7 [IU] via SUBCUTANEOUS
  Administered 2013-12-22: 11 [IU] via SUBCUTANEOUS
  Administered 2013-12-23: 3 [IU] via SUBCUTANEOUS
  Administered 2013-12-23: 11 [IU] via SUBCUTANEOUS
  Administered 2013-12-23: 7 [IU] via SUBCUTANEOUS

## 2013-12-22 MED ORDER — IPRATROPIUM-ALBUTEROL 0.5-2.5 (3) MG/3ML IN SOLN
3.0000 mL | RESPIRATORY_TRACT | Status: DC
  Administered 2013-12-22: 3 mL via RESPIRATORY_TRACT
  Filled 2013-12-22: qty 3

## 2013-12-22 MED ORDER — IPRATROPIUM-ALBUTEROL 0.5-2.5 (3) MG/3ML IN SOLN: 3.0000 mL | Freq: Four times a day (QID) | RESPIRATORY_TRACT | Status: DC

## 2013-12-22 MED ORDER — TECHNETIUM TO 99M ALBUMIN AGGREGATED
3.0000 | Freq: Once | INTRAVENOUS | Status: AC | PRN
  Administered 2013-12-22: 3 via INTRAVENOUS

## 2013-12-22 MED ORDER — BUDESONIDE 0.25 MG/2ML IN SUSP
0.2500 mg | Freq: Two times a day (BID) | RESPIRATORY_TRACT | Status: DC
  Administered 2013-12-22 – 2013-12-23 (×3): 0.25 mg via RESPIRATORY_TRACT
  Filled 2013-12-22 (×6): qty 2

## 2013-12-22 MED ORDER — IPRATROPIUM BROMIDE 0.02 % IN SOLN: 0.5000 mg | RESPIRATORY_TRACT | Status: DC

## 2013-12-22 MED ORDER — METHYLPREDNISOLONE SODIUM SUCC 40 MG IJ SOLR
40.0000 mg | Freq: Every day | INTRAMUSCULAR | Status: DC
  Administered 2013-12-22 – 2013-12-23 (×2): 40 mg via INTRAVENOUS
  Filled 2013-12-22 (×2): qty 1

## 2013-12-22 NOTE — Progress Notes (Signed)
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 Inda Castle, subsequently was transferred to rehabilitation and was complicated by pneumonia and was discharged on Levaquin and presently is on tapering dose of Decadron was brought to the ER because of shortness of breath. Patient states that over the last 3 days patient has been having shortness of breath with cough and pleuritic-type of chest pain. In the ER patient was found to be hypoxic and was initially placed on BiPAP. Chest x-ray shows pneumonic process with pleural effusion. Patient also has been having epigastric pain. Denies any nausea vomiting. Patient has Foley catheter. Troponins are negative EKG was showing sinus tachycardia. Patient has been started on empiric antibiotics and admitted for further workup for acute respiratory failure probably from pneumonia.    HPI/Subjective: 4/30 patient states continued SOB above her baseline. Continues to smoke 1/4 PPD. States has been smoking for approximately 30-40 years. Negative CP. States pneumonia x5 since December 2014  Assessment/Plan: HCAP  -patient with recurrent episodes of pneumonia since December 2014 will have respiratory NTS patient for sputum sample and send Gram stain and culture. -DuoNeb's q 6hr -continue patient's Solu-Medrol 40 mg daily for her previous spinal cord mass resection. This will also help with patient's inflammation secondary to pneumonia. -flutter valve q 4hr while awake -obtain respiratory virus panel -Mucinex DM BID  Acute on chronic respiratory failure -patient noncompliant continue to smoke, which is most likely exacerbating her inability to clear her  pneumonia. -Continue O2 per respiratory to ensure SpO2> 89%   OSA -CPAP per respiratory  COPD -continue DuoNeb scheduled -Continue Pulmicort neb BID -Continue O2 to ensure SpO2> 32%  Chronic diastolic CHF -With increasing O2 demand, will obtain new echocardiogram; pending -continue Lasix 40 mg daily -Strict in and out -Daily a.m. weights  Hyperglycemia -secondary to steroids vs prediabetic obtain hemoglobin A1c -increase Lantus to 10 units daily -Increase to resistant SSI  HLD -not within AHA guidelines will start on Lipitor 20 mg -will most likely have to start a second agent (niacin), but will hold off starting this agent until patient has been on Lipitor for at least 30 days  Anxiety/depression -Continue Cymbalta 60 mg bid -continue Xanax 0.5 mg TID  Paraplegia bilateral lower extremity -consult surgery(Dr. Inda Castle) to inform him that patient has been readmitted -Continue steroids taper per surgery    Code Status: FULL Family Communication: no family present at time of exam Disposition Plan: resolution of pneumonia    Consultants: NA  Procedure/Significant Events: CT abdomen pelvis without contrast 12/21/2013 - no evidence of hydronephrosis nor calcified stones.  -No perinephric inflammatory changes. Stable cysts Rt kidney.   -A large stool burden throughout colon   - Basilar atelectasis on the left and in the inferior aspect of the right middle lobe.  PCXR 12/21/2013 -Small left pleural effusion, with bibasilar airspace opacities. May reflect persistent pneumonia, perhaps slightly improved from the prior study.  Echocardiogram 09/08/2013 -Left ventricle: mild LVH.  -LVEF; 65% to 70%.  - (grade 1 diastolic dysfunction).      Culture    Antibiotics: Cefepime 4/29>> Vancomycin 4/29>>   DVT prophylaxis: SCDs   Devices    LINES / TUBES:  4/29 Ga left  Continuous Infusions:   Objective: VITAL SIGNS: Temp: 98.1 F (36.7 C) (04/30  1152) Temp src: Oral (04/30 1152) BP: 126/87 mmHg (04/30 1200) Pulse Rate: 110 (04/30 1200) SPO2; 97% on 6 L via [place] FIO2:   Intake/Output Summary (Last 24 hours) at 12/22/13 1354 Last data filed at 12/22/13 1157  Gross per 24 hour  Intake    450 ml  Output   1350 ml  Net   -900 ml     Exam: General: A./O. x4, mild to moderate acute on chronic respiratory distress Lungs: Diffuse expiratory wheezing, prolonged expiratory phase, negative crackles Cardiovascular: Tachycardic, regular rhythm without murmur gallop or rub normal S1 and S2 Renalbalance today;        /overall;        Creatinine ; 0.85        Hourly output   Abdomen: Nontender, nondistended, soft, bowel sounds positive, no rebound, no ascites, no appreciable mass Extremities: No significant cyanosis, clubbing. Bilateral lower extremity pedal edema 2+ to mid calf.  Data Reviewed: Basic Metabolic Panel:  Recent Labs Lab 12/21/13 2058 12/21/13 2326 12/22/13 0028 12/22/13 1217  NA 131* 135* 135* 136*  K 4.5 3.5* 3.5* 3.9  CL 96 91* 92* 93*  CO2  --  30 27 29   GLUCOSE 453* 425* 445* 358*  BUN 53* 36* 36* 38*  CREATININE 1.20* 0.89 0.88 0.85  CALCIUM  --  8.5 8.5 8.5  MG  --   --   --  2.2   Liver Function Tests:  Recent Labs Lab 12/21/13 2326 12/22/13 0028 12/22/13 1217  AST 20 20 13   ALT 43* 42* 39*  ALKPHOS 139* 135* 129*  BILITOT <0.2* <0.2* <0.2*  PROT 5.9* 5.8* 6.1  ALBUMIN 2.2* 2.1* 2.3*    Recent Labs Lab 12/21/13 2326  LIPASE 20   No results found for this basename: AMMONIA,  in the last 168 hours CBC:  Recent Labs Lab 12/21/13 2048 12/21/13 2058 12/21/13 2326 12/22/13 0028 12/22/13 1217  WBC 5.9  --  5.7 5.6 5.6  NEUTROABS 5.1  --  4.8 5.0 4.9  HGB 13.3 13.9 12.6 12.8 12.1  HCT 39.8 41.0 37.3 37.4 36.1  MCV 100.3*  --  99.2 98.9 98.6  PLT PLATELET CLUMPS NOTED ON SMEAR, COUNT APPEARS DECREASED  --  73* 71* 84*   Cardiac Enzymes:  Recent Labs Lab 12/21/13 2326  12/22/13 0028 12/22/13 0352 12/22/13 1217  TROPONINI <0.30 <0.30 <0.30 <0.30   BNP (last 3 results)  Recent Labs  09/02/13 1145 09/19/13 1310 12/21/13 2048  PROBNP 622.9* 506.1* 502.4*   CBG:  Recent Labs Lab 12/22/13 0756 12/22/13 1155  GLUCAP 403* 362*    Recent Results (from the past 240 hour(s))  MRSA PCR SCREENING     Status: None   Collection Time    12/22/13 12:04 AM      Result Value Ref Range Status   MRSA by PCR NEGATIVE  NEGATIVE Final   Comment:            The GeneXpert MRSA Assay (FDA     approved for NASAL specimens     only), is one component of a     comprehensive MRSA colonization     surveillance program. It is not     intended to diagnose MRSA     infection nor to guide or     monitor treatment for     MRSA infections.     Studies:  Recent x-ray studies  have been reviewed in detail by the Attending Physician  Scheduled Meds:  Scheduled Meds: . ALPRAZolam  0.5 mg Oral TID  . budesonide (PULMICORT) nebulizer solution  0.25 mg Nebulization BID  . ceFEPime (MAXIPIME) IV  1 g Intravenous Q12H  . dextromethorphan-guaiFENesin  1 tablet Oral BID  . diltiazem  300 mg Oral Daily  . DULoxetine  60 mg Oral BID  . ferrous sulfate  325 mg Oral TID WC  . furosemide  40 mg Intravenous Daily  . insulin aspart  0-9 Units Subcutaneous TID WC  . ipratropium-albuterol  3 mL Nebulization Q6H  . levothyroxine  150 mcg Oral QAC breakfast  . Linaclotide  145 mcg Oral Daily  . loratadine  10 mg Oral QHS  . methylPREDNISolone (SOLU-MEDROL) injection  40 mg Intravenous Daily  . pantoprazole  40 mg Oral BID  . polyethylene glycol  17 g Oral TID  . potassium chloride  20 mEq Oral Daily  . pregabalin  100 mg Oral TID  . simethicone  80 mg Oral QID  . sodium chloride  3 mL Intravenous Q12H  . sodium chloride  3 mL Intravenous Q12H  . tamsulosin  0.4 mg Oral QPC supper  . traZODone  150 mg Oral QHS  . vancomycin  750 mg Intravenous Q12H  . vitamin B-12   5,000 mcg Oral Daily    Time spent on care of this patient: 40 mins   Allie Bossier , MD   [facility] Office  [phone] Pager [phone]  On-Call/Text Page:      [email or web address]      password [email or web address]  If 7PM-7AM, please contact night-coverage [email or web address] Password [email or web address] 12/22/2013, 1:54 PM   LOS: 1 day

## 2013-12-22 NOTE — Progress Notes (Signed)
Utilization review completed. Adah Stoneberg, RN, BSN. 

## 2013-12-22 NOTE — Progress Notes (Signed)
Inpatient Diabetes Program Recommendations  AACE/ADA: New Consensus Statement on Inpatient Glycemic Control (2013)  Target Ranges:  Prepandial:   less than 140 mg/dL      Peak postprandial:   less than 180 mg/dL (1-2 hours)      Critically ill patients:  140 - 180 mg/dL    Inpatient Diabetes Program Recommendations Correction (SSI): Increase to RESISTANT during steroid therapy HgbA1C: order to assess prehospital glucose control  Note: May also need the addition of basal insulin during steroid therapy.  Thank you  Raoul Pitch BSN, RN,CDE Inpatient Diabetes Coordinator [phone] (team pager)

## 2013-12-22 NOTE — Progress Notes (Signed)
  Echocardiogram 2D Echocardiogram has been performed.  Carney Corners 12/22/2013, 3:11 PM

## 2013-12-23 ENCOUNTER — Inpatient Hospital Stay
Admission: AD | Admit: 2013-12-23 | Discharge: 2014-01-03 | Disposition: A | Discharge status: Home Health | Payer: Self-pay | Source: Intra-hospital | Attending: Internal Medicine | Admitting: Internal Medicine

## 2013-12-23 DIAGNOSIS — E119 Type 2 diabetes mellitus without complications: Secondary | ICD-10-CM

## 2013-12-23 HISTORY — DX: Type 2 diabetes mellitus without complications: E11.9

## 2013-12-23 LAB — CBC WITH DIFFERENTIAL/PLATELET
Basophils Absolute: 0 10*3/uL (ref 0.0–0.1)
Basophils Relative: 0 % (ref 0–1)
EOS PCT: 0 % (ref 0–5)
Eosinophils Absolute: 0 10*3/uL (ref 0.0–0.7)
HCT: 33.1 % — ABNORMAL LOW (ref 36.0–46.0)
Hemoglobin: 11 g/dL — ABNORMAL LOW (ref 12.0–15.0)
LYMPHS PCT: 7 % — AB (ref 12–46)
Lymphs Abs: 0.6 10*3/uL — ABNORMAL LOW (ref 0.7–4.0)
MCH: 33.2 pg (ref 26.0–34.0)
MCHC: 33.2 g/dL (ref 30.0–36.0)
MCV: 100 fL (ref 78.0–100.0)
Monocytes Absolute: 0.6 10*3/uL (ref 0.1–1.0)
Monocytes Relative: 7 % (ref 3–12)
NEUTROS PCT: 86 % — AB (ref 43–77)
Neutro Abs: 6.9 10*3/uL (ref 1.7–7.7)
PLATELETS: 107 10*3/uL — AB (ref 150–400)
RBC: 3.31 MIL/uL — AB (ref 3.87–5.11)
RDW: 16.9 % — ABNORMAL HIGH (ref 11.5–15.5)
WBC: 8.1 10*3/uL (ref 4.0–10.5)

## 2013-12-23 LAB — APTT: APTT: 22 s — AB (ref 24–37)

## 2013-12-23 LAB — COMPREHENSIVE METABOLIC PANEL
ALT: 33 U/L (ref 0–35)
AST: 13 U/L (ref 0–37)
Albumin: 2.2 g/dL — ABNORMAL LOW (ref 3.5–5.2)
Alkaline Phosphatase: 110 U/L (ref 39–117)
BUN: 48 mg/dL — ABNORMAL HIGH (ref 6–23)
CALCIUM: 7.9 mg/dL — AB (ref 8.4–10.5)
CO2: 26 mEq/L (ref 19–32)
Chloride: 94 mEq/L — ABNORMAL LOW (ref 96–112)
Creatinine, Ser: 1.1 mg/dL (ref 0.50–1.10)
GFR calc Af Amer: 66 mL/min — ABNORMAL LOW (ref >90)
GFR calc non Af Amer: 57 mL/min — ABNORMAL LOW (ref >90)
Glucose, Bld: 386 mg/dL — ABNORMAL HIGH (ref 70–99)
POTASSIUM: 3.7 meq/L (ref 3.7–5.3)
SODIUM: 138 meq/L (ref 137–147)
TOTAL PROTEIN: 5.4 g/dL — AB (ref 6.0–8.3)
Total Bilirubin: <0.2 mg/dL — ABNORMAL LOW (ref 0.3–1.2)

## 2013-12-23 LAB — PROTIME-INR
INR: 0.92 (ref 0.00–1.49)
PROTHROMBIN TIME: 12.2 s (ref 11.6–15.2)

## 2013-12-23 LAB — GLUCOSE, CAPILLARY
GLUCOSE-CAPILLARY: 273 mg/dL — AB (ref 70–99)
Glucose-Capillary: 214 mg/dL — ABNORMAL HIGH (ref 70–99)
Glucose-Capillary: 148 mg/dL — ABNORMAL HIGH (ref 70–99)

## 2013-12-23 LAB — PHOSPHORUS: Phosphorus: 3.4 mg/dL (ref 2.3–4.6)

## 2013-12-23 LAB — PROCALCITONIN: Procalcitonin: 0.44 ng/mL

## 2013-12-23 LAB — PRO B NATRIURETIC PEPTIDE: Pro B Natriuretic peptide (BNP): 556.6 pg/mL — ABNORMAL HIGH (ref 0–125)

## 2013-12-23 LAB — MAGNESIUM: Magnesium: 2.1 mg/dL (ref 1.5–2.5)

## 2013-12-23 LAB — TSH: TSH: 0.256 u[IU]/mL — ABNORMAL LOW (ref 0.350–4.500)

## 2013-12-23 MED ORDER — IPRATROPIUM-ALBUTEROL 0.5-2.5 (3) MG/3ML IN SOLN: 3.0000 mL | Freq: Four times a day (QID) | RESPIRATORY_TRACT | Status: DC

## 2013-12-23 MED ORDER — VANCOMYCIN HCL IN DEXTROSE 750-5 MG/150ML-% IV SOLN: 750.0000 mg | INTRAVENOUS | Status: AC

## 2013-12-23 MED ORDER — POTASSIUM CHLORIDE CRYS ER 20 MEQ PO TBCR: 20.0000 meq | EXTENDED_RELEASE_TABLET | Freq: Every day | ORAL | Status: AC

## 2013-12-23 MED ORDER — METHYLPREDNISOLONE SODIUM SUCC 40 MG IJ SOLR: 40.0000 mg | Freq: Every day | INTRAMUSCULAR | Status: DC

## 2013-12-23 MED ORDER — INSULIN ASPART 100 UNIT/ML ~~LOC~~ SOLN: 0.0000 [IU] | SUBCUTANEOUS | Status: AC

## 2013-12-23 MED ORDER — BUDESONIDE 0.25 MG/2ML IN SUSP: 0.2500 mg | Freq: Two times a day (BID) | RESPIRATORY_TRACT | Status: AC

## 2013-12-23 MED ORDER — ONDANSETRON HCL 4 MG PO TABS: 4.0000 mg | ORAL_TABLET | Freq: Four times a day (QID) | ORAL | Status: AC | PRN

## 2013-12-23 MED ORDER — ATORVASTATIN CALCIUM 20 MG PO TABS: 20.0000 mg | ORAL_TABLET | Freq: Every day | ORAL | Status: AC

## 2013-12-23 MED ORDER — FUROSEMIDE 10 MG/ML IJ SOLN: 40.0000 mg | Freq: Every day | INTRAMUSCULAR | Status: DC

## 2013-12-23 MED ORDER — DEXTROSE 5 % IV SOLN: 1.0000 g | Freq: Two times a day (BID) | INTRAVENOUS | Status: AC

## 2013-12-23 MED ORDER — ONDANSETRON HCL 4 MG/2ML IJ SOLN: 4.0000 mg | Freq: Four times a day (QID) | INTRAMUSCULAR | Status: DC | PRN

## 2013-12-23 MED ORDER — INSULIN GLARGINE 100 UNIT/ML ~~LOC~~ SOLN: 10.0000 [IU] | Freq: Every day | SUBCUTANEOUS | Status: AC

## 2013-12-23 NOTE — Discharge Summary (Signed)
Physician Discharge Summary  [name] ZDG:[id number] DOB: 13-Nov-1960 DOA: 12/21/2013  PCP: Dema Severin, NP  Admit date: 12/21/2013 Discharge date: 12/23/2013  Time spent: >30 minutes  Recommendations for Outpatient Follow-up:  1. Continue current medical treatments for HCAP 2. Discharge to [facility] 3. Continue current rehabilitative care  Discharge Diagnoses:     Acute respiratory failure with hypoxia/ HCAP (healthcare-associated pneumonia)   Chronic diastolic congestive heart failure, NYHA class 1-stable   History of anxiety - chronic alprazolam   SLEEP APNEA   COPD (chronic obstructive pulmonary disease)-compensated   Hyperglycemia- new diagnosis diabetes mellitus   Paraplegia   HLD (hyperlipidemia)   Discharge Condition: stable  Diet recommendation: Carbohydrate modified  Filed Weights   12/22/13 0815 12/22/13 1449 12/23/13 0500  Weight: 162 lb 0.6 oz (73.5 kg) 162 lb 0.6 oz (73.5 kg) 162 lb 7.7 oz (73.7 kg)    History of present illness:  53 y.o WF PMHx anxiety,COPD on 4 L O2 at home,OSA,chronic diastolic CHF, HLD, hyperglycemia,paraplegia and 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 Rodman Pickle, subsequently was transferred to rehabilitation and was complicated by pneumonia and was discharged on Levaquin and presently is on tapering dose of Decadron was brought to the ER because of shortness of breath. Patient states that over the last 3 days patient has been having shortness of breath with cough and pleuritic-type of chest pain. In the ER patient was found to be hypoxic and was initially placed on BiPAP. Chest x-ray shows pneumonic process with pleural effusion. Patient also has been having epigastric pain. Denies any nausea vomiting. Patient has Foley catheter. Troponins are negative EKG was showing sinus tachycardia. Patient has been started on empiric antibiotics and admitted for further workup for acute respiratory failure probably from  pneumonia.    Hospital Course:  HCAP  -patient with recurrent episodes of pneumonia since December 2014. Ordered respiratory NTS patient for sputum sample and send Gram stain and culture but does not appear to have been collected prior to discharge. -recommend continue DuoNeb's q 6hr  -continue patient's Solu-Medrol 40 mg daily for her previous spinal cord mass resection. This will also help with patient's inflammation secondary to pneumonia.  -flutter valve q 4hr while awake  -Respiratory virus panel pending -Mucinex DM BID   Acute on chronic respiratory failure  -patient noncompliant at home and continued to smoke, which was most likely exacerbating her inability to clear her pneumonia.  -Continue O2 per respiratory to ensure SpO2> 89%   OSA  -CPAP q HS per respiratory   COPD  -continue DuoNeb scheduled  -Continue Pulmicort neb BID  -Continue O2 to ensure SpO2> 89%   Chronic diastolic CHF  -With increasing O2 demand, will obtain new echocardiogram; pending  -continue Lasix 40 mg daily  -Strict in and out  -Daily a.m. weights   Hyperglycemia -NEW DIAGNOSIS: Diabetes Mellitus -secondary to steroids vs prediabetic-- obtained hemoglobin A1c which was elevated at 9.0 c/w new diagnosis diabetes -Lantus started this admission- hopefully can transition to PO med later -recommend continue resistant SSI for now -this needs to be discussed with the patient once she is more styable- she will need extensive education before discharge to home  HLD  -not within AHA guidelines so was started on Lipitor 20 mg this admit -may need to start a second agent (niacin), but would hold off starting this agent until patient has been on Lipitor for at least 30 days   Anxiety/depression  -Continue Cymbalta 60 mg  bid  -continue Xanax 0.5 mg TID   Paraplegia bilateral lower extremity  -we consulted surgery (Dr. Inda Castle) to inform him that patient had been readmitted  -Continue steroids taper per  surgery  Procedures:  None  Consultations:  CIR  Discharge Exam: Filed Vitals:   12/23/13 1128  BP: 120/60  Pulse: 91  Temp: 98.3 F (36.8 C)  Resp: 17   General: A./O. x4, mild to moderate acute on chronic respiratory distress  Lungs: Diffuse expiratory wheezing, prolonged expiratory phase, negative crackles  Cardiovascular: Tachycardic, regular rhythm without murmur gallop or rub normal S1 and S2  Abdomen: Nontender, nondistended, soft, bowel sounds positive, no rebound, no ascites, no appreciable mass  Extremities: No significant cyanosis, clubbing. Bilateral lower extremity pedal edema 2+ to mid calf.       Discharge Orders   Future Appointments Provider Department Dept Phone   01/31/2014 10:00 AM Meredith Staggers, MD [facility] [phone]   Future Orders Complete By Expires   Diet Carb Modified  As directed    Increase activity slowly  As directed        Medication List    STOP taking these medications       dexamethasone 4 MG tablet  Commonly known as:  DECADRON     Fluticasone-Salmeterol 250-50 MCG/DOSE Aepb  Commonly known as:  ADVAIR     furosemide 40 MG tablet  Commonly known as:  LASIX  Replaced by:  furosemide 10 MG/ML injection     levalbuterol 0.63 MG/3ML nebulizer solution  Commonly known as:  XOPENEX     levofloxacin 750 MG tablet  Commonly known as:  LEVAQUIN     tiotropium 18 MCG inhalation capsule  Commonly known as:  SPIRIVA      TAKE these medications       acetaminophen 325 MG tablet  Commonly known as:  TYLENOL  Take 2 tablets (650 mg total) by mouth every 6 (six) hours as needed for mild pain (or Fever >/= 101).     acyclovir 800 MG tablet  Commonly known as:  ZOVIRAX  Take 1 tablet (800 mg total) by mouth 2 (two) times daily.     ALPRAZolam 0.5 MG tablet  Commonly known as:  XANAX  Take 0.5 mg by mouth 3 (three) times daily.     antiseptic oral rinse Liqd  15 mLs by Mouth Rinse  route QID.     atorvastatin 20 MG tablet  Commonly known as:  LIPITOR  Take 1 tablet (20 mg total) by mouth daily at 6 PM.     budesonide 0.25 MG/2ML nebulizer solution  Commonly known as:  PULMICORT  Take 2 mLs (0.25 mg total) by nebulization 2 (two) times daily.     CALCIUM 600 + D PO  Take 1 tablet by mouth 2 (two) times daily.     ceFEPIme 1 g in dextrose 5 % 50 mL  Inject 1 g into the vein every 12 (twelve) hours.     dextromethorphan-guaiFENesin 30-600 MG per 12 hr tablet  Commonly known as:  MUCINEX DM  Take 1 tablet by mouth 2 (two) times daily.     diltiazem 300 MG 24 hr capsule  Commonly known as:  TIAZAC  Take 1 capsule (300 mg total) by mouth daily.     DULoxetine 60 MG capsule  Commonly known as:  CYMBALTA  Take 60 mg by mouth 2 (two) times daily.     ferrous sulfate 325 (65  FE) MG EC tablet  Take 1 tablet (325 mg total) by mouth daily.     furosemide 10 MG/ML injection  Commonly known as:  LASIX  Inject 4 mLs (40 mg total) into the vein daily.     insulin aspart 100 UNIT/ML injection  Commonly known as:  novoLOG  Inject 0-20 Units into the skin every 4 (four) hours. Please use facility based sliding scale insulin     insulin glargine 100 UNIT/ML injection  Commonly known as:  LANTUS  Inject 0.1 mLs (10 Units total) into the skin daily.     ipratropium-albuterol 0.5-2.5 (3) MG/3ML Soln  Commonly known as:  DUONEB  Take 3 mLs by nebulization every 6 (six) hours.     levothyroxine 150 MCG tablet  Commonly known as:  SYNTHROID, LEVOTHROID  Take 150 mcg by mouth daily.     LINZESS 145 MCG Caps capsule  Generic drug:  Linaclotide  Take 145 mcg by mouth daily.     loratadine 10 MG tablet  Commonly known as:  CLARITIN  Take 10 mg by mouth at bedtime.     methylPREDNISolone sodium succinate 40 mg/mL injection  Commonly known as:  SOLU-MEDROL  Inject 1 mL (40 mg total) into the vein daily.     MUSCLE RUB 10-15 % Crea  Apply 1 application topically  at bedtime.     nicotine 14 mg/24hr patch  Commonly known as:  NICODERM CQ - dosed in mg/24 hours  Place 1 patch (14 mg total) onto the skin at bedtime.     ondansetron 4 MG tablet  Commonly known as:  ZOFRAN  Take 1 tablet (4 mg total) by mouth every 6 (six) hours as needed for nausea.     ondansetron 4 MG/2ML Soln injection  Commonly known as:  ZOFRAN  Inject 2 mLs (4 mg total) into the vein every 6 (six) hours as needed for nausea.     oxyCODONE 5 MG immediate release tablet  Commonly known as:  Oxy IR/ROXICODONE  Take 1 tablet (5 mg total) by mouth every 6 (six) hours as needed for severe pain.     pantoprazole 40 MG tablet  Commonly known as:  PROTONIX  Take 1 tablet (40 mg total) by mouth 2 (two) times daily.     polyethylene glycol packet  Commonly known as:  MIRALAX / GLYCOLAX  Take 17 g by mouth daily.     potassium chloride SA 20 MEQ tablet  Commonly known as:  K-DUR,KLOR-CON  Take 1 tablet (20 mEq total) by mouth daily.     pregabalin 100 MG capsule  Commonly known as:  LYRICA  Take 100 mg by mouth 3 (three) times daily.     simethicone 40 MG/0.6ML drops  Commonly known as:  MYLICON  Take 1.2 mLs (80 mg total) by mouth 4 (four) times daily. For gas and bloating.     tamsulosin 0.4 MG Caps capsule  Commonly known as:  FLOMAX  Take 1 capsule (0.4 mg total) by mouth daily after supper.     traZODone 150 MG tablet  Commonly known as:  DESYREL  Take 150 mg by mouth at bedtime.     Vancomycin 750 MG/150ML Soln  Commonly known as:  VANCOCIN  Inject 150 mLs (750 mg total) into the vein daily.     Vitamin B-12 5000 MCG Subl  Place 5,000 mcg under the tongue daily.     VITAMIN D PO  Take 1 tablet by mouth daily.  Allergies  Allergen Reactions  . Tape Itching and Rash    Microbiology: Recent Results (from the past 240 hour(s))  CULTURE, BLOOD (ROUTINE X 2)     Status: None   Collection Time    12/21/13 10:10 PM      Result Value Ref Range  Status   Specimen Description BLOOD RIGHT ARM   Final   Special Requests BOTTLES DRAWN AEROBIC AND ANAEROBIC 10CC   Final   Culture  Setup Time     Final   Value: 12/22/2013 08:31     Performed at [facility]   Culture     Final   Value:        BLOOD CULTURE RECEIVED NO GROWTH TO DATE CULTURE WILL BE HELD FOR 5 DAYS BEFORE ISSUING A FINAL NEGATIVE REPORT     Performed at [facility]   Report Status PENDING   Incomplete  CULTURE, BLOOD (ROUTINE X 2)     Status: None   Collection Time    12/21/13 10:15 PM      Result Value Ref Range Status   Specimen Description BLOOD LEFT HAND   Final   Special Requests BOTTLES DRAWN AEROBIC ONLY 3CC   Final   Culture  Setup Time     Final   Value: 12/22/2013 08:31     Performed at [facility]   Culture     Final   Value:        BLOOD CULTURE RECEIVED NO GROWTH TO DATE CULTURE WILL BE HELD FOR 5 DAYS BEFORE ISSUING A FINAL NEGATIVE REPORT     Performed at [facility]   Report Status PENDING   Incomplete  MRSA PCR SCREENING     Status: None   Collection Time    12/22/13 12:04 AM      Result Value Ref Range Status   MRSA by PCR NEGATIVE  NEGATIVE Final   Comment:            The GeneXpert MRSA Assay (FDA     approved for NASAL specimens     only), is one component of a     comprehensive MRSA colonization     surveillance program. It is not     intended to diagnose MRSA     infection nor to guide or     monitor treatment for     MRSA infections.     Labs: Basic Metabolic Panel:  Recent Labs Lab 12/21/13 2058 12/21/13 2326 12/22/13 0028 12/22/13 1217  NA 131* 135* 135* 136*  K 4.5 3.5* 3.5* 3.9  CL 96 91* 92* 93*  CO2  --  30 27 29   GLUCOSE 453* 425* 445* 358*  BUN 53* 36* 36* 38*  CREATININE 1.20* 0.89 0.88 0.85  CALCIUM  --  8.5 8.5 8.5  MG  --   --   --  2.2   Liver Function Tests:  Recent Labs Lab 12/21/13 2326 12/22/13 0028 12/22/13 1217  AST 20 20 13   ALT 43* 42* 39*  ALKPHOS 139*  135* 129*  BILITOT <0.2* <0.2* <0.2*  PROT 5.9* 5.8* 6.1  ALBUMIN 2.2* 2.1* 2.3*    Recent Labs Lab 12/21/13 2326  LIPASE 20   CBC:  Recent Labs Lab 12/21/13 2048 12/21/13 2058 12/21/13 2326 12/22/13 0028 12/22/13 1217  WBC 5.9  --  5.7 5.6 5.6  NEUTROABS 5.1  --  4.8 5.0 4.9  HGB 13.3 13.9 12.6 12.8 12.1  HCT 39.8 41.0  37.3 37.4 36.1  MCV 100.3*  --  99.2 98.9 98.6  PLT PLATELET CLUMPS NOTED ON SMEAR, COUNT APPEARS DECREASED  --  73* 71* 84*   Cardiac Enzymes:  Recent Labs Lab 12/21/13 2326 12/22/13 0028 12/22/13 0352 12/22/13 1217  TROPONINI <0.30 <0.30 <0.30 <0.30   BNP: BNP (last 3 results)  Recent Labs  09/02/13 1145 09/19/13 1310 12/21/13 2048  PROBNP 622.9* 506.1* 502.4*   CBG:  Recent Labs Lab 12/22/13 2015 12/22/13 2319 12/23/13 0431 12/23/13 0817 12/23/13 1130  GLUCAP 258* 239* 214* 148* 273*   Signed:  Samella Parr ANP [facility] 12/23/2013, 1:56 PM   I have personally examined this patient and reviewed the entire database. I have reviewed the above note, made any necessary editorial changes, and agree with its content.  Cherene Altes, MD [facility]

## 2013-12-23 NOTE — Progress Notes (Signed)
Pt states she does not wear CPAP mask at home and does not wish to wear one here. Pt encouraged to call RT if pt changes mind.

## 2013-12-23 NOTE — Care Management Note (Signed)
    Page 1 of 1   12/23/2013     8:53:37 AM CARE MANAGEMENT NOTE 12/23/2013  Patient:  [name], [name]   Account Number:  [id number]  Date Initiated:  12/23/2013  Documentation initiated by:  Vicent Febles  Subjective/Objective Assessment:   dx PNA; active with [facility] for RN and PT    PCP  [place]  CM consult      Per UR Regulation:  Reviewed for med. necessity/level of care/duration of stay

## 2013-12-24 ENCOUNTER — Other Ambulatory Visit (HOSPITAL_COMMUNITY): Payer: Self-pay

## 2013-12-24 LAB — BLOOD GAS, ARTERIAL
Acid-Base Excess: 8 mmol/L — ABNORMAL HIGH (ref 0.0–2.0)
BICARBONATE: 32.9 meq/L — AB (ref 20.0–24.0)
O2 CONTENT: 4 L/min
O2 Saturation: 94.7 %
PATIENT TEMPERATURE: 98.6
PH ART: 7.398 (ref 7.350–7.450)
PO2 ART: 74.8 mmHg — AB (ref 80.0–100.0)
TCO2: 34.5 mmol/L (ref 0–100)
pCO2 arterial: 54.5 mmHg — ABNORMAL HIGH (ref 35.0–45.0)

## 2013-12-24 LAB — PREALBUMIN: PREALBUMIN: 30.3 mg/dL (ref 17.0–34.0)

## 2013-12-24 LAB — VITAMIN B12: Vitamin B-12: >2000 pg/mL — ABNORMAL HIGH (ref 211–911)

## 2013-12-24 LAB — URINE MICROSCOPIC-ADD ON

## 2013-12-24 LAB — URINALYSIS, ROUTINE W REFLEX MICROSCOPIC
Bilirubin Urine: NEGATIVE
Glucose, UA: NEGATIVE mg/dL
Ketones, ur: NEGATIVE mg/dL
Nitrite: NEGATIVE
PH: 7 (ref 5.0–8.0)
Protein, ur: NEGATIVE mg/dL
SPECIFIC GRAVITY, URINE: 1.009 (ref 1.005–1.030)
UROBILINOGEN UA: 0.2 mg/dL (ref 0.0–1.0)

## 2013-12-24 LAB — HEMOGLOBIN A1C
Hgb A1c MFr Bld: 8.8 % — ABNORMAL HIGH (ref <5.7)
Mean Plasma Glucose: 206 mg/dL — ABNORMAL HIGH (ref <117)

## 2013-12-24 LAB — FOLATE RBC: RBC FOLATE: 1230 ng/mL — AB (ref >280)

## 2013-12-24 LAB — T4, FREE: FREE T4: 1.43 ng/dL (ref 0.80–1.80)

## 2013-12-25 ENCOUNTER — Other Ambulatory Visit (HOSPITAL_COMMUNITY): Payer: Self-pay

## 2013-12-25 LAB — BASIC METABOLIC PANEL
BUN: 45 mg/dL — ABNORMAL HIGH (ref 6–23)
CO2: 32 meq/L (ref 19–32)
CREATININE: 1.02 mg/dL (ref 0.50–1.10)
Calcium: 8.3 mg/dL — ABNORMAL LOW (ref 8.4–10.5)
Chloride: 102 mEq/L (ref 96–112)
GFR calc Af Amer: 73 mL/min — ABNORMAL LOW (ref >90)
GFR, EST NON AFRICAN AMERICAN: 63 mL/min — AB (ref >90)
Glucose, Bld: 262 mg/dL — ABNORMAL HIGH (ref 70–99)
Potassium: 4.3 mEq/L (ref 3.7–5.3)
SODIUM: 147 meq/L (ref 137–147)

## 2013-12-25 LAB — CBC
HCT: 34.8 % — ABNORMAL LOW (ref 36.0–46.0)
Hemoglobin: 11.4 g/dL — ABNORMAL LOW (ref 12.0–15.0)
MCH: 33.6 pg (ref 26.0–34.0)
MCHC: 32.8 g/dL (ref 30.0–36.0)
MCV: 102.7 fL — ABNORMAL HIGH (ref 78.0–100.0)
Platelets: 129 10*3/uL — ABNORMAL LOW (ref 150–400)
RBC: 3.39 MIL/uL — AB (ref 3.87–5.11)
RDW: 17.3 % — AB (ref 11.5–15.5)
WBC: 10.6 10*3/uL — ABNORMAL HIGH (ref 4.0–10.5)

## 2013-12-26 LAB — CBC
HEMATOCRIT: 35.1 % — AB (ref 36.0–46.0)
HEMOGLOBIN: 11.4 g/dL — AB (ref 12.0–15.0)
MCH: 33 pg (ref 26.0–34.0)
MCHC: 32.5 g/dL (ref 30.0–36.0)
MCV: 101.7 fL — ABNORMAL HIGH (ref 78.0–100.0)
Platelets: 139 10*3/uL — ABNORMAL LOW (ref 150–400)
RBC: 3.45 MIL/uL — ABNORMAL LOW (ref 3.87–5.11)
RDW: 17.5 % — AB (ref 11.5–15.5)
WBC: 12.1 10*3/uL — ABNORMAL HIGH (ref 4.0–10.5)

## 2013-12-26 LAB — RESPIRATORY VIRUS PANEL
Adenovirus: NOT DETECTED
Adenovirus: NOT DETECTED
INFLUENZA A H3: NOT DETECTED
INFLUENZA B 1: NOT DETECTED
Influenza A H1: NOT DETECTED
Influenza A H1: NOT DETECTED
Influenza A H3: NOT DETECTED
Influenza A: NOT DETECTED
Influenza A: NOT DETECTED
Influenza B: NOT DETECTED
Metapneumovirus: NOT DETECTED
Metapneumovirus: NOT DETECTED
PARAINFLUENZA 1 A: NOT DETECTED
PARAINFLUENZA 3 A: NOT DETECTED
Parainfluenza 1: NOT DETECTED
Parainfluenza 2: NOT DETECTED
Parainfluenza 2: NOT DETECTED
Parainfluenza 3: NOT DETECTED
RESPIRATORY SYNCYTIAL VIRUS A: NOT DETECTED
RESPIRATORY SYNCYTIAL VIRUS B: NOT DETECTED
RHINOVIRUS: NOT DETECTED
RHINOVIRUS: NOT DETECTED
Respiratory Syncytial Virus A: NOT DETECTED
Respiratory Syncytial Virus B: NOT DETECTED

## 2013-12-26 LAB — PREALBUMIN: PREALBUMIN: 35.6 mg/dL — AB (ref 17.0–34.0)

## 2013-12-26 LAB — URINE CULTURE

## 2013-12-26 LAB — COMPREHENSIVE METABOLIC PANEL
ALT: 31 U/L (ref 0–35)
AST: 22 U/L (ref 0–37)
Albumin: 2.3 g/dL — ABNORMAL LOW (ref 3.5–5.2)
Alkaline Phosphatase: 110 U/L (ref 39–117)
BUN: 50 mg/dL — AB (ref 6–23)
CALCIUM: 9 mg/dL (ref 8.4–10.5)
CO2: 33 mEq/L — ABNORMAL HIGH (ref 19–32)
Chloride: 99 mEq/L (ref 96–112)
Creatinine, Ser: 0.86 mg/dL (ref 0.50–1.10)
GFR calc non Af Amer: 77 mL/min — ABNORMAL LOW (ref >90)
GFR, EST AFRICAN AMERICAN: 89 mL/min — AB (ref >90)
GLUCOSE: 232 mg/dL — AB (ref 70–99)
Potassium: 4.8 mEq/L (ref 3.7–5.3)
Sodium: 142 mEq/L (ref 137–147)
TOTAL PROTEIN: 5.7 g/dL — AB (ref 6.0–8.3)
Total Bilirubin: <0.2 mg/dL — ABNORMAL LOW (ref 0.3–1.2)

## 2013-12-26 LAB — VANCOMYCIN, TROUGH: Vancomycin Tr: 9.5 ug/mL — ABNORMAL LOW (ref 10.0–20.0)

## 2013-12-27 ENCOUNTER — Other Ambulatory Visit (HOSPITAL_COMMUNITY): Payer: Medicare Other

## 2013-12-28 LAB — CBC WITH DIFFERENTIAL/PLATELET
BASOS ABS: 0.1 10*3/uL (ref 0.0–0.1)
BASOS PCT: 1 % (ref 0–1)
Eosinophils Absolute: 0 10*3/uL (ref 0.0–0.7)
Eosinophils Relative: 0 % (ref 0–5)
HEMATOCRIT: 37.7 % (ref 36.0–46.0)
HEMOGLOBIN: 12.1 g/dL (ref 12.0–15.0)
LYMPHS ABS: 1.6 10*3/uL (ref 0.7–4.0)
LYMPHS PCT: 12 % (ref 12–46)
MCH: 33.4 pg (ref 26.0–34.0)
MCHC: 32.1 g/dL (ref 30.0–36.0)
MCV: 104.1 fL — AB (ref 78.0–100.0)
Monocytes Absolute: 1 10*3/uL (ref 0.1–1.0)
Monocytes Relative: 8 % (ref 3–12)
NEUTROS ABS: 10.4 10*3/uL — AB (ref 1.7–7.7)
Neutrophils Relative %: 79 % — ABNORMAL HIGH (ref 43–77)
Platelets: 158 10*3/uL (ref 150–400)
RBC: 3.62 MIL/uL — ABNORMAL LOW (ref 3.87–5.11)
RDW: 18.2 % — AB (ref 11.5–15.5)
WBC: 13.1 10*3/uL — ABNORMAL HIGH (ref 4.0–10.5)

## 2013-12-28 LAB — CULTURE, BLOOD (ROUTINE X 2)
Culture: NO GROWTH
Culture: NO GROWTH

## 2013-12-28 LAB — BASIC METABOLIC PANEL
BUN: 64 mg/dL — AB (ref 6–23)
CO2: 33 meq/L — AB (ref 19–32)
Calcium: 8.9 mg/dL (ref 8.4–10.5)
Chloride: 100 mEq/L (ref 96–112)
Creatinine, Ser: 1.02 mg/dL (ref 0.50–1.10)
GFR calc Af Amer: 73 mL/min — ABNORMAL LOW (ref >90)
GFR, EST NON AFRICAN AMERICAN: 63 mL/min — AB (ref >90)
GLUCOSE: 198 mg/dL — AB (ref 70–99)
Potassium: 4.5 mEq/L (ref 3.7–5.3)
SODIUM: 146 meq/L (ref 137–147)

## 2013-12-31 LAB — BASIC METABOLIC PANEL
BUN: 51 mg/dL — AB (ref 6–23)
CALCIUM: 8.8 mg/dL (ref 8.4–10.5)
CO2: 31 meq/L (ref 19–32)
CREATININE: 0.91 mg/dL (ref 0.50–1.10)
Chloride: 102 mEq/L (ref 96–112)
GFR calc Af Amer: 83 mL/min — ABNORMAL LOW (ref >90)
GFR, EST NON AFRICAN AMERICAN: 72 mL/min — AB (ref >90)
GLUCOSE: 209 mg/dL — AB (ref 70–99)
Potassium: 4.6 mEq/L (ref 3.7–5.3)
Sodium: 144 mEq/L (ref 137–147)

## 2013-12-31 LAB — CBC
HCT: 37.6 % (ref 36.0–46.0)
HEMOGLOBIN: 11.9 g/dL — AB (ref 12.0–15.0)
MCH: 33.1 pg (ref 26.0–34.0)
MCHC: 31.6 g/dL (ref 30.0–36.0)
MCV: 104.7 fL — AB (ref 78.0–100.0)
Platelets: 156 10*3/uL (ref 150–400)
RBC: 3.59 MIL/uL — AB (ref 3.87–5.11)
RDW: 18.5 % — ABNORMAL HIGH (ref 11.5–15.5)
WBC: 12.1 10*3/uL — ABNORMAL HIGH (ref 4.0–10.5)

## 2014-01-02 LAB — PHOSPHORUS: Phosphorus: 3 mg/dL (ref 2.3–4.6)

## 2014-01-02 LAB — BASIC METABOLIC PANEL
BUN: 43 mg/dL — AB (ref 6–23)
CALCIUM: 8.7 mg/dL (ref 8.4–10.5)
CO2: 29 mEq/L (ref 19–32)
Chloride: 103 mEq/L (ref 96–112)
Creatinine, Ser: 0.91 mg/dL (ref 0.50–1.10)
GFR calc Af Amer: 83 mL/min — ABNORMAL LOW (ref >90)
GFR calc non Af Amer: 72 mL/min — ABNORMAL LOW (ref >90)
GLUCOSE: 115 mg/dL — AB (ref 70–99)
Potassium: 4.2 mEq/L (ref 3.7–5.3)
Sodium: 145 mEq/L (ref 137–147)

## 2014-01-02 LAB — CBC
HCT: 36.8 % (ref 36.0–46.0)
Hemoglobin: 11.6 g/dL — ABNORMAL LOW (ref 12.0–15.0)
MCH: 33.3 pg (ref 26.0–34.0)
MCHC: 31.5 g/dL (ref 30.0–36.0)
MCV: 105.7 fL — AB (ref 78.0–100.0)
PLATELETS: 146 10*3/uL — AB (ref 150–400)
RBC: 3.48 MIL/uL — ABNORMAL LOW (ref 3.87–5.11)
RDW: 18.8 % — ABNORMAL HIGH (ref 11.5–15.5)
WBC: 13.1 10*3/uL — ABNORMAL HIGH (ref 4.0–10.5)

## 2014-01-02 LAB — PREALBUMIN: Prealbumin: 47.1 mg/dL — ABNORMAL HIGH (ref 17.0–34.0)

## 2014-01-02 LAB — MAGNESIUM: MAGNESIUM: 2.4 mg/dL (ref 1.5–2.5)

## 2014-01-09 ENCOUNTER — Inpatient Hospital Stay (HOSPITAL_COMMUNITY)
Admission: EM | Admit: 2014-01-09 | Discharge: 2014-01-23 | DRG: 190 | Disposition: E | Payer: Medicare Other | Attending: Pulmonary Disease | Admitting: Pulmonary Disease

## 2014-01-09 ENCOUNTER — Emergency Department (HOSPITAL_COMMUNITY): Payer: Medicare Other

## 2014-01-09 ENCOUNTER — Encounter (HOSPITAL_COMMUNITY): Payer: Self-pay | Admitting: Emergency Medicine

## 2014-01-09 DIAGNOSIS — K219 Gastro-esophageal reflux disease without esophagitis: Secondary | ICD-10-CM | POA: Diagnosis present

## 2014-01-09 DIAGNOSIS — IMO0001 Reserved for inherently not codable concepts without codable children: Secondary | ICD-10-CM | POA: Diagnosis present

## 2014-01-09 DIAGNOSIS — I5032 Chronic diastolic (congestive) heart failure: Secondary | ICD-10-CM | POA: Diagnosis present

## 2014-01-09 DIAGNOSIS — Z9981 Dependence on supplemental oxygen: Secondary | ICD-10-CM

## 2014-01-09 DIAGNOSIS — J189 Pneumonia, unspecified organism: Secondary | ICD-10-CM | POA: Diagnosis present

## 2014-01-09 DIAGNOSIS — J441 Chronic obstructive pulmonary disease with (acute) exacerbation: Principal | ICD-10-CM | POA: Diagnosis present

## 2014-01-09 DIAGNOSIS — I498 Other specified cardiac arrhythmias: Secondary | ICD-10-CM | POA: Diagnosis present

## 2014-01-09 DIAGNOSIS — D696 Thrombocytopenia, unspecified: Secondary | ICD-10-CM | POA: Diagnosis not present

## 2014-01-09 DIAGNOSIS — F411 Generalized anxiety disorder: Secondary | ICD-10-CM

## 2014-01-09 DIAGNOSIS — E785 Hyperlipidemia, unspecified: Secondary | ICD-10-CM | POA: Diagnosis present

## 2014-01-09 DIAGNOSIS — E872 Acidosis, unspecified: Secondary | ICD-10-CM | POA: Diagnosis present

## 2014-01-09 DIAGNOSIS — F329 Major depressive disorder, single episode, unspecified: Secondary | ICD-10-CM | POA: Diagnosis present

## 2014-01-09 DIAGNOSIS — F17200 Nicotine dependence, unspecified, uncomplicated: Secondary | ICD-10-CM | POA: Diagnosis present

## 2014-01-09 DIAGNOSIS — F3289 Other specified depressive episodes: Secondary | ICD-10-CM | POA: Diagnosis present

## 2014-01-09 DIAGNOSIS — G8220 Paraplegia, unspecified: Secondary | ICD-10-CM | POA: Diagnosis present

## 2014-01-09 DIAGNOSIS — I959 Hypotension, unspecified: Secondary | ICD-10-CM | POA: Diagnosis present

## 2014-01-09 DIAGNOSIS — G4733 Obstructive sleep apnea (adult) (pediatric): Secondary | ICD-10-CM | POA: Diagnosis present

## 2014-01-09 DIAGNOSIS — I509 Heart failure, unspecified: Secondary | ICD-10-CM | POA: Diagnosis present

## 2014-01-09 DIAGNOSIS — N179 Acute kidney failure, unspecified: Secondary | ICD-10-CM | POA: Diagnosis present

## 2014-01-09 DIAGNOSIS — Z515 Encounter for palliative care: Secondary | ICD-10-CM

## 2014-01-09 DIAGNOSIS — J449 Chronic obstructive pulmonary disease, unspecified: Secondary | ICD-10-CM

## 2014-01-09 DIAGNOSIS — J9600 Acute respiratory failure, unspecified whether with hypoxia or hypercapnia: Secondary | ICD-10-CM

## 2014-01-09 DIAGNOSIS — D649 Anemia, unspecified: Secondary | ICD-10-CM | POA: Diagnosis present

## 2014-01-09 DIAGNOSIS — R0600 Dyspnea, unspecified: Secondary | ICD-10-CM

## 2014-01-09 DIAGNOSIS — J9620 Acute and chronic respiratory failure, unspecified whether with hypoxia or hypercapnia: Secondary | ICD-10-CM | POA: Diagnosis present

## 2014-01-09 DIAGNOSIS — E119 Type 2 diabetes mellitus without complications: Secondary | ICD-10-CM | POA: Diagnosis present

## 2014-01-09 DIAGNOSIS — G9340 Encephalopathy, unspecified: Secondary | ICD-10-CM | POA: Diagnosis present

## 2014-01-09 DIAGNOSIS — E039 Hypothyroidism, unspecified: Secondary | ICD-10-CM | POA: Diagnosis present

## 2014-01-09 DIAGNOSIS — Z66 Do not resuscitate: Secondary | ICD-10-CM | POA: Diagnosis present

## 2014-01-09 DIAGNOSIS — E876 Hypokalemia: Secondary | ICD-10-CM | POA: Diagnosis present

## 2014-01-09 DIAGNOSIS — J9601 Acute respiratory failure with hypoxia: Secondary | ICD-10-CM

## 2014-01-09 HISTORY — DX: Type 2 diabetes mellitus without complications: E11.9

## 2014-01-09 HISTORY — DX: Anemia, unspecified: D64.9

## 2014-01-09 HISTORY — DX: Essential (primary) hypertension: I10

## 2014-01-09 HISTORY — DX: Shortness of breath: R06.02

## 2014-01-09 HISTORY — DX: Paraplegia, unspecified: G82.20

## 2014-01-09 HISTORY — DX: Sleep apnea, unspecified: G47.30

## 2014-01-09 LAB — GLUCOSE, CAPILLARY
Glucose-Capillary: 137 mg/dL — ABNORMAL HIGH (ref 70–99)
Glucose-Capillary: 138 mg/dL — ABNORMAL HIGH (ref 70–99)
Glucose-Capillary: 127 mg/dL — ABNORMAL HIGH (ref 70–99)

## 2014-01-09 LAB — COMPREHENSIVE METABOLIC PANEL
ALT: 96 U/L — ABNORMAL HIGH (ref 0–35)
AST: 44 U/L — ABNORMAL HIGH (ref 0–37)
Albumin: 2.7 g/dL — ABNORMAL LOW (ref 3.5–5.2)
Alkaline Phosphatase: 157 U/L — ABNORMAL HIGH (ref 39–117)
BILIRUBIN TOTAL: 0.3 mg/dL (ref 0.3–1.2)
BUN: 33 mg/dL — AB (ref 6–23)
CHLORIDE: 100 meq/L (ref 96–112)
CO2: 30 meq/L (ref 19–32)
CREATININE: 1.13 mg/dL — AB (ref 0.50–1.10)
Calcium: 8.8 mg/dL (ref 8.4–10.5)
GFR calc Af Amer: 64 mL/min — ABNORMAL LOW (ref >90)
GFR calc non Af Amer: 55 mL/min — ABNORMAL LOW (ref >90)
Glucose, Bld: 34 mg/dL — CL (ref 70–99)
Potassium: 3 mEq/L — ABNORMAL LOW (ref 3.7–5.3)
Sodium: 143 mEq/L (ref 137–147)
Total Protein: 6.1 g/dL (ref 6.0–8.3)

## 2014-01-09 LAB — CBC WITH DIFFERENTIAL/PLATELET
BASOS PCT: 1 % (ref 0–1)
Basophils Absolute: 0.1 10*3/uL (ref 0.0–0.1)
Eosinophils Absolute: 0 10*3/uL (ref 0.0–0.7)
Eosinophils Relative: 0 % (ref 0–5)
HCT: 34.2 % — ABNORMAL LOW (ref 36.0–46.0)
Hemoglobin: 11.3 g/dL — ABNORMAL LOW (ref 12.0–15.0)
LYMPHS ABS: 1.1 10*3/uL (ref 0.7–4.0)
Lymphocytes Relative: 12 % (ref 12–46)
MCH: 33.8 pg (ref 26.0–34.0)
MCHC: 33 g/dL (ref 30.0–36.0)
MCV: 102.4 fL — ABNORMAL HIGH (ref 78.0–100.0)
Monocytes Absolute: 1.2 10*3/uL — ABNORMAL HIGH (ref 0.1–1.0)
Monocytes Relative: 13 % — ABNORMAL HIGH (ref 3–12)
Neutro Abs: 7.1 10*3/uL (ref 1.7–7.7)
Neutrophils Relative %: 74 % (ref 43–77)
PLATELETS: 148 10*3/uL — AB (ref 150–400)
RBC: 3.34 MIL/uL — AB (ref 3.87–5.11)
RDW: 19.3 % — ABNORMAL HIGH (ref 11.5–15.5)
WBC: 9.5 10*3/uL (ref 4.0–10.5)

## 2014-01-09 LAB — URINALYSIS, ROUTINE W REFLEX MICROSCOPIC
BILIRUBIN URINE: NEGATIVE
GLUCOSE, UA: NEGATIVE mg/dL
KETONES UR: NEGATIVE mg/dL
Nitrite: POSITIVE — AB
PROTEIN: NEGATIVE mg/dL
Specific Gravity, Urine: 1.011 (ref 1.005–1.030)
Urobilinogen, UA: 0.2 mg/dL (ref 0.0–1.0)
pH: 6 (ref 5.0–8.0)

## 2014-01-09 LAB — BLOOD GAS, ARTERIAL
Acid-Base Excess: 4.6 mmol/L — ABNORMAL HIGH (ref 0.0–2.0)
BICARBONATE: 29.7 meq/L — AB (ref 20.0–24.0)
DRAWN BY: 330991
Delivery systems: POSITIVE
EXPIRATORY PAP: 4
FIO2: 0.4 %
INSPIRATORY PAP: 14
O2 SAT: 95.1 %
PATIENT TEMPERATURE: 98.8
PH ART: 7.353 (ref 7.350–7.450)
TCO2: 31.4 mmol/L (ref 0–100)
pCO2 arterial: 54.9 mmHg — ABNORMAL HIGH (ref 35.0–45.0)
pO2, Arterial: 74.1 mmHg — ABNORMAL LOW (ref 80.0–100.0)

## 2014-01-09 LAB — URINE MICROSCOPIC-ADD ON

## 2014-01-09 LAB — I-STAT ARTERIAL BLOOD GAS, ED
ACID-BASE EXCESS: 4 mmol/L — AB (ref 0.0–2.0)
Bicarbonate: 31.3 mEq/L — ABNORMAL HIGH (ref 20.0–24.0)
O2 Saturation: 93 %
PO2 ART: 75 mmHg — AB (ref 80.0–100.0)
TCO2: 33 mmol/L (ref 0–100)
pCO2 arterial: 61.5 mmHg (ref 35.0–45.0)
pH, Arterial: 7.315 — ABNORMAL LOW (ref 7.350–7.450)

## 2014-01-09 LAB — LACTIC ACID, PLASMA: LACTIC ACID, VENOUS: 0.9 mmol/L (ref 0.5–2.2)

## 2014-01-09 LAB — PRO B NATRIURETIC PEPTIDE: Pro B Natriuretic peptide (BNP): 412.4 pg/mL — ABNORMAL HIGH (ref 0–125)

## 2014-01-09 LAB — TROPONIN I

## 2014-01-09 LAB — STREP PNEUMONIAE URINARY ANTIGEN: Strep Pneumo Urinary Antigen: NEGATIVE

## 2014-01-09 LAB — CBG MONITORING, ED: Glucose-Capillary: 93 mg/dL (ref 70–99)

## 2014-01-09 LAB — MRSA PCR SCREENING: MRSA BY PCR: NEGATIVE

## 2014-01-09 LAB — PROCALCITONIN: PROCALCITONIN: 0.94 ng/mL

## 2014-01-09 MED ORDER — IPRATROPIUM-ALBUTEROL 0.5-2.5 (3) MG/3ML IN SOLN
3.0000 mL | Freq: Four times a day (QID) | RESPIRATORY_TRACT | Status: DC
  Administered 2014-01-09 – 2014-01-12 (×11): 3 mL via RESPIRATORY_TRACT
  Filled 2014-01-09 (×12): qty 3

## 2014-01-09 MED ORDER — VANCOMYCIN HCL IN DEXTROSE 1-5 GM/200ML-% IV SOLN
1000.0000 mg | Freq: Once | INTRAVENOUS | Status: AC
  Administered 2014-01-09: 1000 mg via INTRAVENOUS
  Filled 2014-01-09: qty 200

## 2014-01-09 MED ORDER — IPRATROPIUM BROMIDE 0.02 % IN SOLN
0.5000 mg | RESPIRATORY_TRACT | Status: AC
  Administered 2014-01-09: 0.5 mg via RESPIRATORY_TRACT

## 2014-01-09 MED ORDER — SODIUM CHLORIDE 0.9 % IV SOLN: 250.0000 mL | INTRAVENOUS | Status: DC | PRN

## 2014-01-09 MED ORDER — LEVOTHYROXINE SODIUM 100 MCG IV SOLR
75.0000 ug | Freq: Every day | INTRAVENOUS | Status: DC
  Administered 2014-01-10: 75 ug via INTRAVENOUS
  Filled 2014-01-09 (×3): qty 5

## 2014-01-09 MED ORDER — METHYLPREDNISOLONE SODIUM SUCC 125 MG IJ SOLR
125.0000 mg | Freq: Once | INTRAMUSCULAR | Status: AC
  Administered 2014-01-09: 125 mg via INTRAVENOUS
  Filled 2014-01-09: qty 2

## 2014-01-09 MED ORDER — SODIUM CHLORIDE 0.45 % IV SOLN
INTRAVENOUS | Status: DC
  Administered 2014-01-09: 12:00:00 via INTRAVENOUS

## 2014-01-09 MED ORDER — SODIUM CHLORIDE 0.45 % IV SOLN
INTRAVENOUS | Status: DC
  Administered 2014-01-09 – 2014-01-10 (×2): via INTRAVENOUS

## 2014-01-09 MED ORDER — POTASSIUM CHLORIDE 10 MEQ/100ML IV SOLN
10.0000 meq | INTRAVENOUS | Status: DC
Start: 2014-01-09 — End: 2014-01-09

## 2014-01-09 MED ORDER — DEXTROSE 50 % IV SOLN
1.0000 | Freq: Once | INTRAVENOUS | Status: AC
  Administered 2014-01-09: 50 mL via INTRAVENOUS

## 2014-01-09 MED ORDER — VANCOMYCIN HCL IN DEXTROSE 750-5 MG/150ML-% IV SOLN
750.0000 mg | Freq: Two times a day (BID) | INTRAVENOUS | Status: DC
  Administered 2014-01-10: 750 mg via INTRAVENOUS
  Filled 2014-01-09 (×2): qty 150

## 2014-01-09 MED ORDER — ALBUTEROL SULFATE (2.5 MG/3ML) 0.083% IN NEBU
INHALATION_SOLUTION | RESPIRATORY_TRACT | Status: AC
  Filled 2014-01-09: qty 6

## 2014-01-09 MED ORDER — ENOXAPARIN SODIUM 40 MG/0.4ML ~~LOC~~ SOLN
40.0000 mg | SUBCUTANEOUS | Status: DC
  Administered 2014-01-09: 40 mg via SUBCUTANEOUS
  Filled 2014-01-09 (×3): qty 0.4

## 2014-01-09 MED ORDER — METHYLPREDNISOLONE SODIUM SUCC 40 MG IJ SOLR: 40.0000 mg | Freq: Every day | INTRAMUSCULAR | Status: DC

## 2014-01-09 MED ORDER — PANTOPRAZOLE SODIUM 40 MG IV SOLR
40.0000 mg | INTRAVENOUS | Status: DC
  Administered 2014-01-09: 40 mg via INTRAVENOUS
  Filled 2014-01-09 (×2): qty 40

## 2014-01-09 MED ORDER — ALBUTEROL SULFATE (2.5 MG/3ML) 0.083% IN NEBU
2.5000 mg | INHALATION_SOLUTION | RESPIRATORY_TRACT | Status: DC | PRN
  Administered 2014-01-10 – 2014-01-11 (×3): 2.5 mg via RESPIRATORY_TRACT
  Filled 2014-01-09 (×3): qty 3

## 2014-01-09 MED ORDER — POTASSIUM CHLORIDE 10 MEQ/100ML IV SOLN
10.0000 meq | INTRAVENOUS | Status: AC
  Administered 2014-01-09 (×4): 10 meq via INTRAVENOUS
  Filled 2014-01-09 (×4): qty 100

## 2014-01-09 MED ORDER — ALBUTEROL (5 MG/ML) CONTINUOUS INHALATION SOLN
10.0000 mg/h | INHALATION_SOLUTION | RESPIRATORY_TRACT | Status: DC
  Administered 2014-01-09: 10 mg/h via RESPIRATORY_TRACT
  Filled 2014-01-09: qty 20

## 2014-01-09 MED ORDER — NICOTINE 14 MG/24HR TD PT24
14.0000 mg | MEDICATED_PATCH | Freq: Every day | TRANSDERMAL | Status: DC
  Administered 2014-01-09: 14 mg via TRANSDERMAL
  Filled 2014-01-09 (×2): qty 1

## 2014-01-09 MED ORDER — BIOTENE DRY MOUTH MT LIQD
15.0000 mL | Freq: Two times a day (BID) | OROMUCOSAL | Status: DC
  Administered 2014-01-09 – 2014-01-12 (×7): 15 mL via OROMUCOSAL

## 2014-01-09 MED ORDER — METHYLPREDNISOLONE SODIUM SUCC 125 MG IJ SOLR
80.0000 mg | Freq: Four times a day (QID) | INTRAMUSCULAR | Status: DC
  Administered 2014-01-09 – 2014-01-10 (×3): 80 mg via INTRAVENOUS
  Filled 2014-01-09 (×7): qty 1.28

## 2014-01-09 MED ORDER — POTASSIUM CHLORIDE IN NACL 20-0.45 MEQ/L-% IV SOLN: INTRAVENOUS | Status: DC

## 2014-01-09 MED ORDER — IPRATROPIUM BROMIDE 0.02 % IN SOLN
RESPIRATORY_TRACT | Status: AC
  Filled 2014-01-09: qty 2.5

## 2014-01-09 MED ORDER — INSULIN ASPART 100 UNIT/ML ~~LOC~~ SOLN
0.0000 [IU] | SUBCUTANEOUS | Status: DC
  Administered 2014-01-09 – 2014-01-10 (×3): 3 [IU] via SUBCUTANEOUS
  Administered 2014-01-10: 11 [IU] via SUBCUTANEOUS
  Administered 2014-01-10: 4 [IU] via SUBCUTANEOUS
  Administered 2014-01-11: 7 [IU] via SUBCUTANEOUS

## 2014-01-09 MED ORDER — DEXTROSE 50 % IV SOLN
INTRAVENOUS | Status: AC
  Filled 2014-01-09: qty 50

## 2014-01-09 MED ORDER — FUROSEMIDE 10 MG/ML IJ SOLN
40.0000 mg | Freq: Once | INTRAMUSCULAR | Status: AC
  Administered 2014-01-09: 40 mg via INTRAVENOUS
  Filled 2014-01-09: qty 4

## 2014-01-09 MED ORDER — PIPERACILLIN-TAZOBACTAM 3.375 G IVPB 30 MIN
3.3750 g | Freq: Once | INTRAVENOUS | Status: AC
  Administered 2014-01-09: 3.375 g via INTRAVENOUS
  Filled 2014-01-09: qty 50

## 2014-01-09 MED ORDER — PIPERACILLIN-TAZOBACTAM 3.375 G IVPB
3.3750 g | Freq: Three times a day (TID) | INTRAVENOUS | Status: DC
  Administered 2014-01-09 – 2014-01-10 (×2): 3.375 g via INTRAVENOUS
  Filled 2014-01-09 (×4): qty 50

## 2014-01-09 MED ORDER — ALBUTEROL SULFATE (2.5 MG/3ML) 0.083% IN NEBU
5.0000 mg | INHALATION_SOLUTION | RESPIRATORY_TRACT | Status: AC
  Administered 2014-01-09: 5 mg via RESPIRATORY_TRACT

## 2014-01-09 NOTE — Care Management Note (Signed)
    Page 1 of 1   01/23/14     1:32:07 PM CARE MANAGEMENT NOTE 01-23-2014  Patient:  ROSETTA, RUPNOW   Account Number:  [id number]  Date Initiated:  January 23, 2014  Documentation initiated by:  Elissa Hefty  Subjective/Objective Assessment:   adm w fever     Action/Plan:   lives w husband, pcp dr Heide Scales, chart states has aid at home   Anticipated DC Date:     Anticipated DC Plan:           Choice offered to / List presented to:             Status of service:   Medicare Important Message given?   (If response is "NO", the following Medicare IM given date fields will be blank) Date Medicare IM given:   Date Additional Medicare IM given:    Discharge Disposition:    Per UR Regulation:  Reviewed for med. necessity/level of care/duration of stay  If discussed at [place] of Stay Meetings, dates discussed:    Comments:

## 2014-01-09 NOTE — Progress Notes (Signed)
ANTIBIOTIC CONSULT NOTE - INITIAL  Pharmacy Consult for Vancomycin, Zosyn Indication: pneumonia  Allergies  Allergen Reactions  . Tape Itching and Rash    Patient Measurements:   Adjusted Body Weight: n/a  Vital Signs: Temp: 98.8 F (37.1 C) (05/18 1038) Temp src: Oral (05/18 1038) BP: 98/68 mmHg (05/18 1200) Pulse Rate: 111 (05/18 1200) Intake/Output from previous day:   Intake/Output from this shift: Total I/O In: 50 [I.V.:50] Out: 400 [Urine:400]  Labs:  Recent Labs  01/16/2014 0930  WBC 9.5  HGB 11.3*  PLT 148*  CREATININE 1.13*   The CrCl is unknown because both a height and weight (above a minimum accepted value) are required for this calculation. No results found for this basename: VANCOTROUGH, VANCOPEAK, VANCORANDOM, GENTTROUGH, GENTPEAK, GENTRANDOM, TOBRATROUGH, TOBRAPEAK, TOBRARND, AMIKACINPEAK, AMIKACINTROU, AMIKACIN,  in the last 72 hours   Microbiology: Recent Results (from the past 720 hour(s))  CULTURE, BLOOD (ROUTINE X 2)     Status: None   Collection Time    12/21/13 10:10 PM      Result Value Ref Range Status   Specimen Description BLOOD RIGHT ARM   Final   Special Requests BOTTLES DRAWN AEROBIC AND ANAEROBIC 10CC   Final   Culture  Setup Time     Final   Value: 12/22/2013 08:31     Performed at [facility]   Culture     Final   Value: NO GROWTH 5 DAYS     Performed at [facility]   Report Status 12/28/2013 FINAL   Final  CULTURE, BLOOD (ROUTINE X 2)     Status: None   Collection Time    12/21/13 10:15 PM      Result Value Ref Range Status   Specimen Description BLOOD LEFT HAND   Final   Special Requests BOTTLES DRAWN AEROBIC ONLY 3CC   Final   Culture  Setup Time     Final   Value: 12/22/2013 08:31     Performed at [facility]   Culture     Final   Value: NO GROWTH 5 DAYS     Performed at [facility]   Report Status 12/28/2013 FINAL   Final  MRSA PCR SCREENING     Status: None   Collection Time     12/22/13 12:04 AM      Result Value Ref Range Status   MRSA by PCR NEGATIVE  NEGATIVE Final   Comment:            The GeneXpert MRSA Assay (FDA     approved for NASAL specimens     only), is one component of a     comprehensive MRSA colonization     surveillance program. It is not     intended to diagnose MRSA     infection nor to guide or     monitor treatment for     MRSA infections.  RESPIRATORY VIRUS PANEL     Status: None   Collection Time    12/22/13  7:59 PM      Result Value Ref Range Status   Source - RVPAN NASAL SWAB   Corrected   Comment: CORRECTED ON 05/04 AT 0651: PREVIOUSLY REPORTED AS NASAL SWAB   Respiratory Syncytial Virus A NOT DETECTED   Final   Respiratory Syncytial Virus B NOT DETECTED   Final   Influenza A NOT DETECTED   Final   Influenza B NOT DETECTED   Final   Parainfluenza 1   NOT DETECTED   Final   Parainfluenza 2 NOT DETECTED   Final   Parainfluenza 3 NOT DETECTED   Final   Metapneumovirus NOT DETECTED   Final   Rhinovirus NOT DETECTED   Final   Adenovirus NOT DETECTED   Final   Influenza A H1 NOT DETECTED   Final   Influenza A H3 NOT DETECTED   Final   Comment: (NOTE)           Normal Reference Range for each Analyte: NOT DETECTED     Testing performed using the Luminex xTAG Respiratory Viral Panel test     kit.     This test was developed and its performance characteristics determined     by [facility]. It has not been cleared or approved by the [place]     Food and Drug Administration. This test is used for clinical purposes.     It should not be regarded as investigational or for research. This     laboratory is certified under the [place] (CLIA) as qualified to perform high complexity     clinical laboratory testing.     Performed at [place]     Status: None   Collection Time    12/24/13 10:10 AM      Result Value Ref Range Status   Specimen Description  URINE, RANDOM   Final   Special Requests NONE   Final   Culture  Setup Time     Final   Value: 12/24/2013 18:20     Performed at [place]     Final   Value: >=100,000 COLONIES/ML     Performed at [facility]   Culture     Final   Value: Multiple bacterial morphotypes present, none predominant. Suggest appropriate recollection if clinically indicated.     Performed at [facility]   Report Status 12/26/2013 FINAL   Final  RESPIRATORY VIRUS PANEL     Status: None   Collection Time    12/25/13  6:36 AM      Result Value Ref Range Status   Source - RVPAN NASAL SWAB   Corrected   Comment: CORRECTED ON 05/04 AT 1804: PREVIOUSLY REPORTED AS NASAL SWAB   Respiratory Syncytial Virus A NOT DETECTED   Final   Respiratory Syncytial Virus B NOT DETECTED   Final   Influenza A NOT DETECTED   Final   Influenza B NOT DETECTED   Final   Parainfluenza 1 NOT DETECTED   Final   Parainfluenza 2 NOT DETECTED   Final   Parainfluenza 3 NOT DETECTED   Final   Metapneumovirus NOT DETECTED   Final   Rhinovirus NOT DETECTED   Final   Adenovirus NOT DETECTED   Final   Influenza A H1 NOT DETECTED   Final   Influenza A H3 NOT DETECTED   Final   Comment: (NOTE)           Normal Reference Range for each Analyte: NOT DETECTED     Testing performed using the Luminex xTAG Respiratory Viral Panel test     kit.     This test was developed and its performance characteristics determined     by [facility]. It has not been cleared or approved by the [place]     Food and Drug Administration. This test is used for clinical purposes.  It should not be regarded as investigational or for research. This     laboratory is certified under the [place] (CLIA) as qualified to perform high complexity     clinical laboratory testing.     Performed at [facility] History: Past Medical History  Diagnosis Date   . Right-sided heart failure   . COPD (chronic obstructive pulmonary disease)   . Emphysema   . Seizures   . Thyroid disease   . Depression   . Emphysema   . Arthritis     back and legs  . Blood transfusion without reported diagnosis   . High cholesterol   . Anxiety   . Nerves   . Panic attack   . Thyroid disorder   . Hypothyroidism   . Pneumonia   . CHF (congestive heart failure)   . On home O2     4L N/C chronic  . Fibromyalgia     Medications:   (Not in a hospital admission) Assessment: 50 YOF who was recently discharged on 5/1 now admitted for acute hypoxic resp failure, HCAP, and COPD exacerbation. Pharmacy consulted to dose empiric Vancomycin and Zosyn. She received 1 dose of Vancomycin 1 gm IV and Zosyn 3.375 gm IV in the ED. CrCl ~ 55 mL/min. WBC wnl. Max temp 101.2 F.   5/18 Blood Cx x2>>   Goal of Therapy:  Vancomycin trough level 15-20 mcg/ml  Plan:  1) Vancomycin 750 mg IV Q 12 hours  2) Zosyn 3.375 gm IV Q 8 hours 3) Monitor CBC, cultures, renal fx, and clinical progress 4) Vanc trough at steady state   Albertina Parr, PharmD.  Clinical Pharmacist Pager [phone]

## 2014-01-09 NOTE — ED Notes (Signed)
PCCM at the bedside 

## 2014-01-09 NOTE — ED Notes (Signed)
Pt placed on Bipap per EDP order. ABG to follow after being on Bipap. Family updated and remains at the bedside.

## 2014-01-09 NOTE — ED Notes (Signed)
RT at the bedside to obtain gas

## 2014-01-09 NOTE — ED Notes (Signed)
Informed provider that pt's respiratory rate has increased and is diaphoretic. Provider at the bedside.

## 2014-01-09 NOTE — Progress Notes (Signed)
Pt transported from ED to 2M15 on BIPAP without incidence.

## 2014-01-09 NOTE — ED Notes (Signed)
Attempted Report 

## 2014-01-09 NOTE — ED Notes (Addendum)
Pt to department via EMS from home- reports that she was released from the hospital on the 12th for PNA and respiratory failure. Husband states that today that she started feeling like she couldn't breath again. Normal wears 4L at home. 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 Bp-148/110 Hr-130 Pt received. 5mg  albuterol and 0.5mg  atrovent.  Pt with indwelling foley from home. Reports that it was just changer yesterday by home health nurse.

## 2014-01-09 NOTE — ED Notes (Signed)
Lab at the bedside 

## 2014-01-09 NOTE — ED Notes (Signed)
CBG 93 

## 2014-01-09 NOTE — ED Provider Notes (Signed)
CSN: [id number]     Arrival date & time 02/03/14  0907 History   First MD Initiated Contact with Patient 2014/02/03 [phone]     Chief Complaint  Patient presents with  . Shortness of Breath     (Consider location/radiation/quality/duration/timing/severity/associated sxs/prior Treatment) HPI Comments: Patient is a 53 year old female with history of COPD and CHF. She was brought by EMS from home for evaluation of difficulty breathing. She was recently admitted for pneumonia and discharged approximately 6 days ago. She started feeling badly again this morning and felt as if she could not breathe. She feels tight in the chest and reports increased swelling of her legs. She has a indwelling Foley catheter.  Patient is a 53 y.o. female presenting with shortness of breath. The history is provided by the patient.  Shortness of Breath Severity:  Severe Onset quality:  Sudden Duration:  2 hours Timing:  Constant Progression:  Worsening Chronicity:  Recurrent Context: activity   Relieved by:  Nothing Worsened by:  Nothing tried Ineffective treatments:  None tried   Past Medical History  Diagnosis Date  . Right-sided heart failure   . COPD (chronic obstructive pulmonary disease)   . Emphysema   . Seizures   . Thyroid disease   . Depression   . Emphysema   . Arthritis     back and legs  . Blood transfusion without reported diagnosis   . High cholesterol   . Anxiety   . Nerves   . Panic attack   . Thyroid disorder   . Hypothyroidism   . Pneumonia   . CHF (congestive heart failure)   . On home O2     4L N/C chronic  . Fibromyalgia    Past Surgical History  Procedure Laterality Date  . Abdominal hysterectomy    . Cesarean section    . Cholecystectomy    . Laser removal condyloma    . Laminectomy N/A 11/12/2013    Procedure: THORACIC LAMINECTOMY FOR MASS;  Surgeon: Eustace Moore, MD;  Location: [place] [facility];  Service: Neurosurgery;  Laterality: N/A;   Family History  Problem  Relation Age of Onset  . Cancer Father    History  Substance Use Topics  . Smoking status: Current Every Day Smoker    Last Attempt to Quit: 08/02/2013  . Smokeless tobacco: Never Used  . Alcohol Use: No   OB History   Grav Para Term Preterm Abortions TAB SAB Ect Mult Living   1 1 1       1      Review of Systems  Respiratory: Positive for shortness of breath.   All other systems reviewed and are negative.     Allergies  Tape  Home Medications   Prior to Admission medications   Medication Sig Start Date End Date Taking? Authorizing Provider  acetaminophen (TYLENOL) 325 MG tablet Take 2 tablets (650 mg total) by mouth every 6 (six) hours as needed for mild pain (or Fever >/= 101). 11/16/13   Kelvin Cellar, MD  acyclovir (ZOVIRAX) 800 MG tablet Take 1 tablet (800 mg total) by mouth 2 (two) times daily. 11/16/13   Kelvin Cellar, MD  ALPRAZolam Duanne Moron) 0.5 MG tablet Take 0.5 mg by mouth 3 (three) times daily.     Historical Provider, MD  antiseptic oral rinse (BIOTENE) LIQD 15 mLs by Mouth Rinse route QID. 12/09/13   Ivan Anchors Love, PA-C  atorvastatin (LIPITOR) 20 MG tablet Take 1 tablet (20 mg total) by mouth daily at  6 PM. 12/23/13   Samella Parr, NP  budesonide (PULMICORT) 0.25 MG/2ML nebulizer solution Take 2 mLs (0.25 mg total) by nebulization 2 (two) times daily. 12/23/13   Samella Parr, NP  Calcium Carb-Cholecalciferol (CALCIUM 600 + D PO) Take 1 tablet by mouth 2 (two) times daily.    Historical Provider, MD  ceFEPIme 1 g in dextrose 5 % 50 mL Inject 1 g into the vein every 12 (twelve) hours. 12/23/13   Samella Parr, NP  Cholecalciferol (VITAMIN D PO) Take 1 tablet by mouth daily.    Historical Provider, MD  Cyanocobalamin (VITAMIN B-12) 5000 MCG SUBL Place 5,000 mcg under the tongue daily.    Historical Provider, MD  dextromethorphan-guaiFENesin (MUCINEX DM) 30-600 MG per 12 hr tablet Take 1 tablet by mouth 2 (two) times daily. 12/09/13   Bary Leriche, PA-C  diltiazem  (TIAZAC) 300 MG 24 hr capsule Take 1 capsule (300 mg total) by mouth daily. 12/12/13   Tiffany L Reed, DO  DULoxetine (CYMBALTA) 60 MG capsule Take 60 mg by mouth 2 (two) times daily.    Historical Provider, MD  ferrous sulfate 325 (65 FE) MG EC tablet Take 1 tablet (325 mg total) by mouth daily. 12/09/13   Bary Leriche, PA-C  furosemide (LASIX) 10 MG/ML injection Inject 4 mLs (40 mg total) into the vein daily. 12/23/13   Samella Parr, NP  insulin aspart (NOVOLOG) 100 UNIT/ML injection Inject 0-20 Units into the skin every 4 (four) hours. Please use facility based sliding scale insulin 12/23/13   Samella Parr, NP  insulin glargine (LANTUS) 100 UNIT/ML injection Inject 0.1 mLs (10 Units total) into the skin daily. 12/23/13   Samella Parr, NP  ipratropium-albuterol (DUONEB) 0.5-2.5 (3) MG/3ML SOLN Take 3 mLs by nebulization every 6 (six) hours. 12/23/13   Samella Parr, NP  levothyroxine (SYNTHROID, LEVOTHROID) 150 MCG tablet Take 150 mcg by mouth daily.    Historical Provider, MD  Linaclotide Rolan Lipa) 145 MCG CAPS capsule Take 145 mcg by mouth daily.    Historical Provider, MD  loratadine (CLARITIN) 10 MG tablet Take 10 mg by mouth at bedtime.    Historical Provider, MD  Menthol-Methyl Salicylate (MUSCLE RUB) 10-15 % CREA Apply 1 application topically at bedtime.    Historical Provider, MD  methylPREDNISolone sodium succinate (SOLU-MEDROL) 40 mg/mL injection Inject 1 mL (40 mg total) into the vein daily. 12/23/13   Samella Parr, NP  nicotine (NICODERM CQ - DOSED IN MG/24 HOURS) 14 mg/24hr patch Place 1 patch (14 mg total) onto the skin at bedtime. 11/16/13   Kelvin Cellar, MD  ondansetron (ZOFRAN) 4 MG tablet Take 1 tablet (4 mg total) by mouth every 6 (six) hours as needed for nausea. 12/23/13   Samella Parr, NP  ondansetron (ZOFRAN) 4 MG/2ML SOLN injection Inject 2 mLs (4 mg total) into the vein every 6 (six) hours as needed for nausea. 12/23/13   Samella Parr, NP  oxyCODONE (OXY  IR/ROXICODONE) 5 MG immediate release tablet Take 1 tablet (5 mg total) by mouth every 6 (six) hours as needed for severe pain. 12/09/13   Bary Leriche, PA-C  pantoprazole (PROTONIX) 40 MG tablet Take 1 tablet (40 mg total) by mouth 2 (two) times daily. 06/27/13   Tiffany L Reed, DO  polyethylene glycol (MIRALAX / GLYCOLAX) packet Take 17 g by mouth daily.    Historical Provider, MD  potassium chloride SA (K-DUR,KLOR-CON) 20 MEQ tablet Take 1 tablet (  20 mEq total) by mouth daily. 12/23/13   Samella Parr, NP  pregabalin (LYRICA) 100 MG capsule Take 100 mg by mouth 3 (three) times daily.    Historical Provider, MD  simethicone (MYLICON) 40 ZO/1.0RU drops Take 1.2 mLs (80 mg total) by mouth 4 (four) times daily. For gas and bloating. 12/09/13   Bary Leriche, PA-C  tamsulosin (FLOMAX) 0.4 MG CAPS capsule Take 1 capsule (0.4 mg total) by mouth daily after supper. 11/16/13   Kelvin Cellar, MD  traZODone (DESYREL) 150 MG tablet Take 150 mg by mouth at bedtime.    Historical Provider, MD  Vancomycin (VANCOCIN) 750 MG/150ML SOLN Inject 150 mLs (750 mg total) into the vein daily. 12/23/13   Samella Parr, NP   SpO2 97% Physical Exam  Nursing note and vitals reviewed. Constitutional: She is oriented to person, place, and time.  Patient is a 53 year old female in moderate respiratory distress. She is chronically ill appearing and appears much older than stated age.  HENT:  Head: Normocephalic and atraumatic.  Mouth/Throat: Oropharynx is clear and moist.  Neck: Normal range of motion. Neck supple.  Cardiovascular: Normal rate, regular rhythm and normal heart sounds.   No murmur heard. Pulmonary/Chest: She is in respiratory distress. She has wheezes.  There are rhonchorous breath sounds to inspiration and expiration. She is in moderate respiratory distress and is having difficulty finishing sentences.  Abdominal: Soft. Bowel sounds are normal. She exhibits no distension. There is no tenderness.   Musculoskeletal: Normal range of motion. She exhibits edema.  There is 2-3+ pitting edema of the bilateral lower extremities.  Lymphadenopathy:    She has no cervical adenopathy.  Neurological: She is alert and oriented to person, place, and time.  Skin: Skin is warm and dry.    ED Course  Procedures (including critical care time) Labs Review Labs Reviewed  CBC WITH DIFFERENTIAL  COMPREHENSIVE METABOLIC PANEL  TROPONIN I  PRO B NATRIURETIC PEPTIDE    Imaging Review No results found.   EKG Interpretation   Date/Time:  01/11/14 09:22:48 EDT Ventricular Rate:  124 PR Interval:  138 QRS Duration: 71 QT Interval:  282 QTC Calculation: 405 R Axis:   -28 Text Interpretation:  Sinus tachycardia Multiform ventricular premature  complexes Borderline left axis deviation Low voltage, extremity and  precordial leads Consider anterior infarct Confirmed by DELOS  MD, Lucciana Head  ([id number]) on 01/11/2014 9:28:34 AM      MDM   Final diagnoses:  None    Patient is a 53 year old female with history of advanced COPD. She was recently discharged after being admitted for pneumonia and COPD exacerbation. This required temporary intubation and ventilator management. She became short of breath this morning. She was found initially with O2 saturations in the 70s while on her 4 L nasal cannula. Workup reveals bilateral lower lobe atelectasis with what I suspect is a left lower lobe infiltrate. She is febrile with a temp of 101. Blood cultures were obtained and she was given vancomycin and Zosyn to treat for healthcare associated pneumonia. Blood gas reveals a respiratory acidosis. She became very dyspneic and had difficulty breathing and required BiPAP. She is now more alert but I feel should be admitted to the ICU. I've spoken with the ICU attending who will evaluate the patient in the ER.  CRITICAL CARE Performed by: Veryl Speak Total critical care time: 60 minutes Critical care time  was exclusive of separately billable procedures and treating other patients. Critical care  was necessary to treat or prevent imminent or life-threatening deterioration. Critical care was time spent personally by me on the following activities: development of treatment plan with patient and/or surrogate as well as nursing, discussions with consultants, evaluation of patient's response to treatment, examination of patient, obtaining history from patient or surrogate, ordering and performing treatments and interventions, ordering and review of laboratory studies, ordering and review of radiographic studies, pulse oximetry and re-evaluation of patient's condition.     Veryl Speak, MD 01/15/2014 [phone]

## 2014-01-09 NOTE — ED Notes (Signed)
Provider informed about temp and BP at this time. Family at the bedside

## 2014-01-09 NOTE — H&P (Signed)
PULMONARY / CRITICAL CARE MEDICINE  Name: [name] MRN: [id number] DOB: 02/13/1961    ADMISSION DATE:  01/18/2014 CONSULTATION DATE:    REFERRING MD :  EDP PRIMARY SERVICE:  PCCM  CHIEF COMPLAINT:  SOB  BRIEF PATIENT DESCRIPTION: 53 y.o. F with recent admission for PNA brought to ED with SOB.  In ED, hypoxic requiring BiPAP.  PCCM was consulted. 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 Inda Castle,   SIGNIFICANT EVENTS / STUDIES:  5/1 - d/c'd after being admitted for acute hypoxic resp failure, HCAP, COPD exac.  LINES / TUBES: Foley 4/17 >>>  CULTURES: Blood 5/18 >>> Respiratory 5/18  >>> U. Strep 5/18 >>> U.Legionella 5/18 >>>  ANTIBIOTICS: Zosyn 5/18 >>> Vancomycin 5/18 >>>    HISTORY OF PRESENT ILLNESS:  Pt is encephalopathic; therefore, this HPI is obtained from chart review. [name] is a 53 y.o. F with multiple co-morbidities including recent admission for hypoxic resp failure / COPD exacerbation and just d/c'd from [facility] on 5/1 and transferred to [facility] and was then d/c'd home on 5/12.  She presents again via EMS 5/18 for SOB.  Per ED notes, pt was brought in from home due to difficulty breathing that began morning of presentation.  She also reported feeling tight in the chest along with increased swelling of the legs. In ED, she was put on [place]; however, became more tachypneic requiring BiPAP.  PCCM was consulted for admission.  During my exam, she is very sleepy but will respond to verbal and painful stimuli.  PAST MEDICAL HISTORY :  Past Medical History  Diagnosis Date  . Right-sided heart failure   . COPD (chronic obstructive pulmonary disease)   . Emphysema   . Seizures   . Thyroid disease   . Depression   . Emphysema   .  Arthritis     back and legs  . Blood transfusion without reported diagnosis   . High cholesterol   . Anxiety   . Nerves   . Panic attack   . Thyroid disorder   . Hypothyroidism   . Pneumonia   . CHF (congestive heart failure)   . On home O2     4L N/C chronic  . Fibromyalgia    Past Surgical History  Procedure Laterality Date  . Abdominal hysterectomy    . Cesarean section    . Cholecystectomy    . Laser removal condyloma    . Laminectomy N/A 11/12/2013    Procedure: THORACIC LAMINECTOMY FOR MASS;  Surgeon: Eustace Moore, MD;  Location: [facility];  Service: Neurosurgery;  Laterality: N/A;   Prior to Admission medications   Medication Sig Start Date End Date Taking? Authorizing Provider  acetaminophen (TYLENOL) 325 MG tablet Take 2 tablets (650 mg total) by mouth every 6 (six) hours as needed for mild pain (or Fever >/= 101). 11/16/13   Kelvin Cellar, MD  acyclovir (ZOVIRAX) 800 MG tablet Take 1 tablet (800 mg total) by mouth 2 (two) times daily. 11/16/13   Kelvin Cellar, MD  ALPRAZolam Duanne Moron) 0.5 MG tablet Take 0.5 mg by mouth 3 (three) times daily.     Historical Provider, MD  antiseptic oral rinse (BIOTENE) LIQD 15 mLs by Mouth Rinse route QID. 12/09/13   Olin Hauser  S Love, PA-C  atorvastatin (LIPITOR) 20 MG tablet Take 1 tablet (20 mg total) by mouth daily at 6 PM. 12/23/13   Samella Parr, NP  budesonide (PULMICORT) 0.25 MG/2ML nebulizer solution Take 2 mLs (0.25 mg total) by nebulization 2 (two) times daily. 12/23/13   Samella Parr, NP  Calcium Carb-Cholecalciferol (CALCIUM 600 + D PO) Take 1 tablet by mouth 2 (two) times daily.    Historical Provider, MD  ceFEPIme 1 g in dextrose 5 % 50 mL Inject 1 g into the vein every 12 (twelve) hours. 12/23/13   Samella Parr, NP  Cholecalciferol (VITAMIN D PO) Take 1 tablet by mouth daily.    Historical Provider, MD  Cyanocobalamin (VITAMIN B-12) 5000 MCG SUBL Place 5,000 mcg under the tongue daily.    Historical Provider, MD   dextromethorphan-guaiFENesin (MUCINEX DM) 30-600 MG per 12 hr tablet Take 1 tablet by mouth 2 (two) times daily. 12/09/13   Bary Leriche, PA-C  diltiazem (TIAZAC) 300 MG 24 hr capsule Take 1 capsule (300 mg total) by mouth daily. 12/12/13   Tiffany L Reed, DO  DULoxetine (CYMBALTA) 60 MG capsule Take 60 mg by mouth 2 (two) times daily.    Historical Provider, MD  ferrous sulfate 325 (65 FE) MG EC tablet Take 1 tablet (325 mg total) by mouth daily. 12/09/13   Bary Leriche, PA-C  furosemide (LASIX) 10 MG/ML injection Inject 4 mLs (40 mg total) into the vein daily. 12/23/13   Samella Parr, NP  insulin aspart (NOVOLOG) 100 UNIT/ML injection Inject 0-20 Units into the skin every 4 (four) hours. Please use facility based sliding scale insulin 12/23/13   Samella Parr, NP  insulin glargine (LANTUS) 100 UNIT/ML injection Inject 0.1 mLs (10 Units total) into the skin daily. 12/23/13   Samella Parr, NP  ipratropium-albuterol (DUONEB) 0.5-2.5 (3) MG/3ML SOLN Take 3 mLs by nebulization every 6 (six) hours. 12/23/13   Samella Parr, NP  levothyroxine (SYNTHROID, LEVOTHROID) 150 MCG tablet Take 150 mcg by mouth daily.    Historical Provider, MD  Linaclotide Rolan Lipa) 145 MCG CAPS capsule Take 145 mcg by mouth daily.    Historical Provider, MD  loratadine (CLARITIN) 10 MG tablet Take 10 mg by mouth at bedtime.    Historical Provider, MD  Menthol-Methyl Salicylate (MUSCLE RUB) 10-15 % CREA Apply 1 application topically at bedtime.    Historical Provider, MD  methylPREDNISolone sodium succinate (SOLU-MEDROL) 40 mg/mL injection Inject 1 mL (40 mg total) into the vein daily. 12/23/13   Samella Parr, NP  nicotine (NICODERM CQ - DOSED IN MG/24 HOURS) 14 mg/24hr patch Place 1 patch (14 mg total) onto the skin at bedtime. 11/16/13   Kelvin Cellar, MD  ondansetron (ZOFRAN) 4 MG tablet Take 1 tablet (4 mg total) by mouth every 6 (six) hours as needed for nausea. 12/23/13   Samella Parr, NP  ondansetron (ZOFRAN) 4 MG/2ML  SOLN injection Inject 2 mLs (4 mg total) into the vein every 6 (six) hours as needed for nausea. 12/23/13   Samella Parr, NP  oxyCODONE (OXY IR/ROXICODONE) 5 MG immediate release tablet Take 1 tablet (5 mg total) by mouth every 6 (six) hours as needed for severe pain. 12/09/13   Bary Leriche, PA-C  pantoprazole (PROTONIX) 40 MG tablet Take 1 tablet (40 mg total) by mouth 2 (two) times daily. 06/27/13   Tiffany L Reed, DO  polyethylene glycol (MIRALAX / GLYCOLAX) packet Take 17 g by  mouth daily.    Historical Provider, MD  potassium chloride SA (K-DUR,KLOR-CON) 20 MEQ tablet Take 1 tablet (20 mEq total) by mouth daily. 12/23/13   Samella Parr, NP  pregabalin (LYRICA) 100 MG capsule Take 100 mg by mouth 3 (three) times daily.    Historical Provider, MD  simethicone (MYLICON) 40 IP/3.8SN drops Take 1.2 mLs (80 mg total) by mouth 4 (four) times daily. For gas and bloating. 12/09/13   Bary Leriche, PA-C  tamsulosin (FLOMAX) 0.4 MG CAPS capsule Take 1 capsule (0.4 mg total) by mouth daily after supper. 11/16/13   Kelvin Cellar, MD  traZODone (DESYREL) 150 MG tablet Take 150 mg by mouth at bedtime.    Historical Provider, MD  Vancomycin (VANCOCIN) 750 MG/150ML SOLN Inject 150 mLs (750 mg total) into the vein daily. 12/23/13   Samella Parr, NP   Allergies  Allergen Reactions  . Tape Itching and Rash    FAMILY HISTORY:  Family History  Problem Relation Age of Onset  . Cancer Father     SOCIAL HISTORY:  reports that she has been smoking.  She has never used smokeless tobacco. She reports that she does not drink alcohol or use illicit drugs.   REVIEW OF SYSTEMS:  Unable to complete as pt is ecephalopathic.   SUBJECTIVE: Placed on BiPAP by EDP.  VITAL SIGNS: Temp:  [98.8 F (37.1 C)-101.2 F (38.4 C)] 98.8 F (37.1 C) (05/18 1038) Pulse Rate:  [111-124] 111 (05/18 1200) Resp:  [16-28] 18 (05/18 1200) BP: (98-133)/(48-68) 98/68 mmHg (05/18 1200) SpO2:  [88 %-100 %] 95 % (05/18  1200)  HEMODYNAMICS:    VENTILATOR SETTINGS:    INTAKE / OUTPUT: Intake/Output     05/17 0701 - 05/18 0700 05/18 0701 - 05/19 0700   I.V.  50   Total Intake   50   Urine  400   Total Output   400   Net   -350          PHYSICAL EXAMINATION: General: Obese female, appears older than stated age, on BiPAP, very sleepy but does arouse to voice/pain. Neuro: Very sleeply, non-focal.  HEENT: /AT. PERRL, sclerae anicteric. Cardiovascular: RRR, no M/R/G.  Lungs: Respirations even and unlabored on BiPAP.  Scattered rhonchi / wheezes. Abdomen: BS x 4, soft, NT/ND.  Musculoskeletal: No gross deformities, 1+ edema.  Skin: Intact, warm, no rashes.    LABS: CBC  Recent Labs Lab 12/29/2013 0930  WBC 9.5  HGB 11.3*  HCT 34.2*  PLT 148*   Coag's No results found for this basename: APTT, INR,  in the last 168 hours BMET  Recent Labs Lab 01/11/2014 0930  NA 143  K 3.0*  CL 100  CO2 30  BUN 33*  CREATININE 1.13*  GLUCOSE 34*   Electrolytes  Recent Labs Lab 01/15/2014 0930  CALCIUM 8.8   Sepsis Markers No results found for this basename: LATICACIDVEN, PROCALCITON, O2SATVEN,  in the last 168 hours ABG  Recent Labs Lab 01/12/2014 1107  PHART 7.315*  PCO2ART 61.5*  PO2ART 75.0*   Liver Enzymes  Recent Labs Lab 01/08/2014 0930  AST 44*  ALT 96*  ALKPHOS 157*  BILITOT 0.3  ALBUMIN 2.7*   Cardiac Enzymes  Recent Labs Lab 12/29/2013 0930  TROPONINI <0.30  PROBNP 412.4*   Glucose  Recent Labs Lab 01/12/2014 1126  GLUCAP 93    CXR:  Dg Chest Port 1 View  01/14/2014   CLINICAL DATA:  Shortness of breath, congestion  EXAM: PORTABLE CHEST - 1 VIEW  COMPARISON:  Portable exam 0300 hr compared to 12/25/2013  FINDINGS: Normal heart size, mediastinal contours, and pulmonary vascularity.  Increased RIGHT basilar atelectasis.  Persistent LEFT lower lobe opacity which could represent atelectasis or consolidation.  Upper lungs clear.  No pleural effusion or  pneumothorax.  Bones demineralized.  IMPRESSION: Persistent LEFT lower lobe atelectasis versus consolidation with increased RIGHT basilar atelectasis since 12/25/2013   Electronically Signed   By: Lavonia Dana M.D.   On: 12/30/2013 09:35    ASSESSMENT / PLAN:  PULMONARY A:   Acute Hypoxic Respiratory Failure  - on BiPAP currently, though note at risk for intubation if becomes less responsive. AECOPD Emphysema - on CT 4/13 Tobacco abuse P:   - BiPAP as needed -rechk ABg, intubate if worse - DuoNebs q6hrs / Albuterol q3hr PRN. - Solumedrol 40mg  QD. - Vanc / Zosyn empirically. - PCT algorithm, low threshold to stop abx if low. - Blood / sputum cultures, U. Strep, U.Legionella. - Nicotine patch. - CXR in AM.  CARDIOVASCULAR A:  Hx of chronic diastolic CHF Hypotension P:  - Lasix 40 given in ED.  Will hold further doses for now given soft BP.  RENAL A:   AKI Hypokalemia P:   - 1/2 NS @ 56. - 4 runs K. - BMP in AM.  GASTROINTESTINAL A:   NPO GERD P:   - Once more alert, will consider diet. - Pantoprazole IV while NPO.  HEMATOLOGIC A:   VTE Px P:  - VTE Proph:  Lovenox / SCD's. - CBC in AM.  INFECTIOUS A:   Concern for HCAP, may be residual LLL infx from last admission P:   - Cultures and antibiotics as above, narrow abx as cultures result. - PCT Algorithm, low threshold to stop abx therapy if PCT low esp with no leukocytosis/fever.  ENDOCRINE  A:   DM Hypothyroidism P:   - SSI. - Synthroid 75mg  QD (1/2 home dose).  NEUROLOGIC A:   Acute Encephalopathy Hx of Depression P:   -  Hold home Xanax, Cymbalta, Trazodone.   [place], [facility] Pgr: [phone]  or [phone]  Summary - Follow mental status on bipap, ABG does not appear far from baseline. This is her 7th admission in past 6 months. Not clear to me how much her paraplegia improved after rehab . Unfortunately, intubation may very well lead to  vent dependence & tstomy for her.  I have personally obtained history, examined patient, evaluated and interpreted laboratory and imaging results, reviewed medical records, formulated assessment / plan and placed orders.  CRITICAL CARE:  The patient is critically ill with multiple organ systems failure and requires high complexity decision making for assessment and support, frequent evaluation and titration of therapies, application of advanced monitoring technologies and extensive interpretation of multiple databases. Critical Care Time devoted to patient care services described in this note is 60 minutes.    Kara Mead MD. [place] Flood. [facility] Pager [phone] If no response call [phone]

## 2014-01-10 ENCOUNTER — Encounter (HOSPITAL_COMMUNITY): Payer: Self-pay | Admitting: General Practice

## 2014-01-10 ENCOUNTER — Inpatient Hospital Stay (HOSPITAL_COMMUNITY): Payer: Medicare Other

## 2014-01-10 DIAGNOSIS — Z515 Encounter for palliative care: Secondary | ICD-10-CM

## 2014-01-10 DIAGNOSIS — F411 Generalized anxiety disorder: Secondary | ICD-10-CM

## 2014-01-10 DIAGNOSIS — R0989 Other specified symptoms and signs involving the circulatory and respiratory systems: Secondary | ICD-10-CM

## 2014-01-10 DIAGNOSIS — R0609 Other forms of dyspnea: Secondary | ICD-10-CM

## 2014-01-10 DIAGNOSIS — J189 Pneumonia, unspecified organism: Secondary | ICD-10-CM

## 2014-01-10 DIAGNOSIS — Z66 Do not resuscitate: Secondary | ICD-10-CM

## 2014-01-10 HISTORY — DX: Pneumonia, unspecified organism: J18.9

## 2014-01-10 LAB — CBC
HEMATOCRIT: 31.7 % — AB (ref 36.0–46.0)
Hemoglobin: 10.3 g/dL — ABNORMAL LOW (ref 12.0–15.0)
MCH: 33.3 pg (ref 26.0–34.0)
MCHC: 32.5 g/dL (ref 30.0–36.0)
MCV: 102.6 fL — AB (ref 78.0–100.0)
PLATELETS: 144 10*3/uL — AB (ref 150–400)
RBC: 3.09 MIL/uL — ABNORMAL LOW (ref 3.87–5.11)
RDW: 19.4 % — AB (ref 11.5–15.5)
WBC: 8.2 10*3/uL (ref 4.0–10.5)

## 2014-01-10 LAB — BASIC METABOLIC PANEL
BUN: 30 mg/dL — AB (ref 6–23)
CALCIUM: 8.5 mg/dL (ref 8.4–10.5)
CO2: 28 mEq/L (ref 19–32)
CREATININE: 1.01 mg/dL (ref 0.50–1.10)
Chloride: 104 mEq/L (ref 96–112)
GFR, EST AFRICAN AMERICAN: 73 mL/min — AB (ref >90)
GFR, EST NON AFRICAN AMERICAN: 63 mL/min — AB (ref >90)
Glucose, Bld: 116 mg/dL — ABNORMAL HIGH (ref 70–99)
Potassium: 3 mEq/L — ABNORMAL LOW (ref 3.7–5.3)
Sodium: 145 mEq/L (ref 137–147)

## 2014-01-10 LAB — GLUCOSE, CAPILLARY
Glucose-Capillary: 115 mg/dL — ABNORMAL HIGH (ref 70–99)
Glucose-Capillary: 131 mg/dL — ABNORMAL HIGH (ref 70–99)
Glucose-Capillary: 173 mg/dL — ABNORMAL HIGH (ref 70–99)
Glucose-Capillary: 282 mg/dL — ABNORMAL HIGH (ref 70–99)
Glucose-Capillary: 91 mg/dL (ref 70–99)
Glucose-Capillary: 96 mg/dL (ref 70–99)

## 2014-01-10 LAB — LEGIONELLA ANTIGEN, URINE: LEGIONELLA ANTIGEN, URINE: NEGATIVE

## 2014-01-10 LAB — MAGNESIUM: MAGNESIUM: 2.2 mg/dL (ref 1.5–2.5)

## 2014-01-10 LAB — PHOSPHORUS: Phosphorus: 4.5 mg/dL (ref 2.3–4.6)

## 2014-01-10 MED ORDER — POTASSIUM CHLORIDE 10 MEQ/100ML IV SOLN
10.0000 meq | INTRAVENOUS | Status: AC
  Administered 2014-01-10 (×4): 10 meq via INTRAVENOUS
  Filled 2014-01-10 (×4): qty 100

## 2014-01-10 MED ORDER — PANTOPRAZOLE SODIUM 40 MG PO TBEC
40.0000 mg | DELAYED_RELEASE_TABLET | Freq: Every day | ORAL | Status: DC
  Administered 2014-01-10: 40 mg via ORAL
  Filled 2014-01-10: qty 1

## 2014-01-10 MED ORDER — POTASSIUM CHLORIDE 2 MEQ/ML IV SOLN
INTRAVENOUS | Status: DC
  Administered 2014-01-10 – 2014-01-12 (×3): via INTRAVENOUS
  Filled 2014-01-10 (×5): qty 1000

## 2014-01-10 MED ORDER — POTASSIUM CHLORIDE 10 MEQ/100ML IV SOLN: 10.0000 meq | INTRAVENOUS | Status: DC

## 2014-01-10 MED ORDER — METHYLPREDNISOLONE SODIUM SUCC 125 MG IJ SOLR
80.0000 mg | Freq: Three times a day (TID) | INTRAMUSCULAR | Status: DC
  Administered 2014-01-10 – 2014-01-11 (×3): 80 mg via INTRAVENOUS
  Filled 2014-01-10 (×6): qty 1.28

## 2014-01-10 MED ORDER — ENOXAPARIN SODIUM 40 MG/0.4ML ~~LOC~~ SOLN
40.0000 mg | SUBCUTANEOUS | Status: DC
  Administered 2014-01-10: 40 mg via SUBCUTANEOUS
  Filled 2014-01-10 (×3): qty 0.4

## 2014-01-10 MED ORDER — MORPHINE SULFATE 2 MG/ML IJ SOLN
INTRAMUSCULAR | Status: AC
  Filled 2014-01-10: qty 1

## 2014-01-10 MED ORDER — MORPHINE SULFATE 2 MG/ML IJ SOLN
2.0000 mg | INTRAMUSCULAR | Status: DC | PRN
Start: 2014-01-10 — End: 2014-01-13
  Administered 2014-01-10: 2 mg via INTRAVENOUS
  Administered 2014-01-10: 4 mg via INTRAVENOUS
  Administered 2014-01-10: 2 mg via INTRAVENOUS
  Administered 2014-01-10: 4 mg via INTRAVENOUS
  Administered 2014-01-11 – 2014-01-13 (×3): 2 mg via INTRAVENOUS
  Filled 2014-01-10 (×2): qty 1
  Filled 2014-01-10: qty 2
  Filled 2014-01-10: qty 1
  Filled 2014-01-10: qty 2
  Filled 2014-01-10: qty 1

## 2014-01-10 MED ORDER — DEXTROSE 5 % IV SOLN
1.0000 g | INTRAVENOUS | Status: DC
  Administered 2014-01-10: 1 g via INTRAVENOUS
  Filled 2014-01-10 (×2): qty 10

## 2014-01-10 NOTE — Consult Note (Signed)
Patient [name]      DOB: March 27, 1961      EUM:[id number]     Consult Note from the Palliative Medicine Team at [facility] Requested by: Dr [name]     PCP: Kelsey Riches, NP Reason for Consultation:Clarification of [place] and options     Phone Number:[phone]  Assessment of patients Current state:  Continued significant decline since January 2015/ post hospitalization with influenza, multiple comorbidities and medical complications.  Family reports physical and functional decline for the past four years.  Patient is now total care and non ambulatory.  Patient and family faced with advanced directives and anticipatory care needs  Consult is for review of medical treatment options, clarification of goals of care and end of life issues, disposition and options, and symptom recommendation.  This NP [name] reviewed medical records, received report from team, assessed the patient and then meet at the patient's bedside along with her husband and son and neice  to discuss diagnosis prognosis, GOC, EOL wishes disposition and options.  A detailed discussion was had today regarding advanced directives.  Concepts specific to code status, artifical feeding and hydration, continued IV antibiotics and rehospitalization was had.  The difference between a aggressive medical intervention path  and a palliative comfort care path for this patient at this time was had.  Values and goals of care important to patient and family were attempted to be elicited.  Concept of Hospice and Palliative Care were discussed  Natural trajectory and expectations at EOL were discussed.  Questions and concerns addressed.  Hard Choices booklet left for review. Family encouraged to call with questions or concerns.  PMT will continue to support holistically.   Goals of Care: 1.  Code Status: DNR/DNI-comfort is main focus of care   2. Scope of Treatment:   Per detailed conversation with family they  wish to shift treatment plan to focus on comfort,quality and dignity.  They verbalize that the patient has "suffered long enough" and although she does not want to die, she does not want to live "like this either".    [name] spoke directly with her about this and she very clearly agreeded with plan for comfort.  I then spoke with patient, offered emotional support and assured her that we will make every attempt to ensure comfort and dignity within the realm of hope.  Family wish to continue with  Bi PaP until entire family can gather later the afternoon; approximately at 3 pm.  When transition occurs, recommend:  1. Vital Signs: daily, dc monitor  2. Respiratory/Oxygen:for comfort only, support with medications to enhance comfort 3. Nutritional Support/Tube Feeds: no artifical feeding now or in the future 4. Antibiotics: oral agents,  if tolerated 5. Blood Products: none 6. VQM:GQQP, KVO for medications 7. Review of Medications to be discontinued: minimize for comfort 8. Labs: none 9. Telemetry: discontinue    3. Disposition:  Dependant on outcomes.  Patient may decline rapidly without BiPap support.  Her wish is to "die at home" but understands that may not be logistically possible.  She is open to an in-patient hospice  facility, she adamantly does not want to "go to a nursing home"    4. Symptom Management Recommendations:   1. Anxiety/Agitation: Ativan 1 mg IV every 4 hrs prn 2. Dyspnea: Morphine 1-2 mg IV  every 1 hr prn, may consider a continuous gtt to maximize comfort with starting dose of 2 mg/hr and titrate to comfort  5. Psychosocial:  Emotional support offered to family, they all support each other in a discussion for full comfort.  Family and patietnt understand the overall poor prognosis  6. Spiritual: decline chaplain consult at this time   Patient Documents Completed or Given: Document Given Completed  Advanced Directives Pkt    MOST  yes  DNR    Gone  from [facility]    Hard Choices yes     Brief HPI:  53 y.o. F with recent admission for PNA brought to ED with SOB. In ED, hypoxic requiring BiPAP.   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 Right-sided heart failure   . COPD (chronic obstructive pulmonary disease)   . Emphysema   . Seizures   . Thyroid disease   . Depression   . Emphysema   . Arthritis     back and legs  . Blood transfusion without reported diagnosis   . High cholesterol   . Anxiety   . Nerves   . Panic attack   . Thyroid disorder   . Hypothyroidism   . Pneumonia   . CHF (congestive heart failure)   . On home O2     4L N/C chronic  . Fibromyalgia      PSH: Past Surgical History  Procedure Laterality Date  . Abdominal hysterectomy    . Cesarean section    . Cholecystectomy    . Laser removal condyloma    . Laminectomy N/A 11/12/2013    Procedure: THORACIC LAMINECTOMY FOR MASS;  Surgeon: Kelsey Moore, MD;  Location: [facility];  Service: Neurosurgery;  Laterality: N/A;   I have reviewed the [place] and SH and  If appropriate update it with new information. Allergies  Allergen Reactions  . Tape Itching and Rash   Scheduled Meds: . antiseptic oral rinse  15 mL Mouth Rinse BID  . cefTRIAXone (ROCEPHIN)  IV  1 g Intravenous Q24H  . enoxaparin (LOVENOX) injection  40 mg Subcutaneous Q24H  . insulin aspart  0-20 Units Subcutaneous 6 times per day  . ipratropium-albuterol  3 mL Nebulization Q6H  . levothyroxine  75 mcg Intravenous QAC breakfast  . methylPREDNISolone (SOLU-MEDROL) injection  80 mg  Intravenous 3 times per day  . pantoprazole  40 mg Oral Q1200   Continuous Infusions: . dextrose 5 % with kcl 50 mL/hr at 01/10/14 0951   PRN Meds:.sodium chloride, albuterol    BP 123/80  Pulse 114  Temp(Src) 98.3 F (36.8 C) (Oral)  Resp 19  Ht 5\' 2"  (1.575 m)  Wt 76.4 kg (168 lb 6.9 oz)  BMI 30.80 kg/m2  SpO2 96%   PPS:30 % at best   Intake/Output Summary (Last 24 hours) at 01/10/14 1043 Last data filed at 01/10/14 0951  Gross per 24 hour  Intake 2618.75 ml  Output   2250 ml  Net 368.75 ml    Physical Exam:  General: chronically ill appearing HEENT:  BiPap in place, edemadous conjunctiva  Chest: decreased in bases, scattered coarse BS CVS: tchycardic Abdomen: obese soft decreased BS Neuro: alert  to person and place  Labs: CBC    Component Value Date/Time   WBC 8.2 01/10/2014 0320   RBC 3.09* 01/10/2014 0320   HGB 10.3* 01/10/2014 0320   HCT 31.7* 01/10/2014 0320   PLT 144* 01/10/2014 0320   MCV 102.6* 01/10/2014 0320   MCH 33.3 01/10/2014 0320   MCHC 32.5 01/10/2014 0320   RDW 19.4* 01/10/2014 0320   LYMPHSABS 1.1 01-24-14 0930   MONOABS 1.2* Jan 24, 2014 0930   EOSABS 0.0 01-24-14 0930   BASOSABS 0.1 24-Jan-2014 0930    BMET    Component Value Date/Time   NA 145 01/10/2014 0320   K 3.0* 01/10/2014 0320   CL 104 01/10/2014 0320   CO2 28 01/10/2014 0320   GLUCOSE 116* 01/10/2014 0320   BUN 30* 01/10/2014 0320   CREATININE 1.01 01/10/2014 0320   CALCIUM 8.5 01/10/2014 0320   GFRNONAA 63* 01/10/2014 0320   GFRAA 73* 01/10/2014 0320    CMP     Component Value Date/Time   NA 145 01/10/2014 0320   K 3.0* 01/10/2014 0320   CL 104 01/10/2014 0320   CO2 28 01/10/2014 0320   GLUCOSE 116* 01/10/2014 0320   BUN 30* 01/10/2014 0320   CREATININE 1.01 01/10/2014 0320   CALCIUM 8.5 01/10/2014 0320   PROT 6.1 Jan 24, 2014 0930   ALBUMIN 2.7* 01/24/14 0930   AST 44* 2014-01-24 0930   ALT 96* 2014-01-24 0930   ALKPHOS 157* 2014/01/24 0930   BILITOT 0.3 January 24, 2014 0930    GFRNONAA 63* 01/10/2014 0320   GFRAA 73* 01/10/2014 0320    Time In Time Out Total Time Spent with Patient Total Overall Time  1100 1300 110 min 120 min    Greater than 50%  of this time was spent counseling and coordinating care related to the above assessment and plan.   Kelsey Lessen NP  Palliative Medicine Team Team Phone # [phone] Pager [phone]  Discussed with Dr Alva Garnet

## 2014-01-10 NOTE — Progress Notes (Signed)
[facility] ADULT ICU REPLACEMENT PROTOCOL FOR AM LAB REPLACEMENT ONLY  The patient does apply for the [facility] Adult ICU Electrolyte Replacment Protocol based on the criteria listed below:   1. Is GFR >/= 40 ml/min? yes  Patient's GFR today is 63 2. Is urine output >/= 0.5 ml/kg/hr for the last 6 hours? yes Patient's UOP is .6 ml/kg/hr 3. Is BUN < 60 mg/dL? yes  Patient's BUN today is 30 4. Abnormal electrolyte  K 3.0 5. Ordered repletion with: per protocol  6. If a panic level lab has been reported, has the CCM MD in charge been notified? yes.   Physician:  Dr Lorita Officer McEachran Veniamin Kincaid 01/10/2014 4:35 AM

## 2014-01-10 NOTE — Progress Notes (Signed)
PULMONARY / CRITICAL CARE MEDICINE  Name: [name] MRN: [id number] DOB: 1961-06-09    ADMISSION DATE:  01/17/2014 CONSULTATION DATE:    REFERRING MD :  EDP PRIMARY SERVICE:  PCCM  CHIEF COMPLAINT:  SOB  BRIEF PATIENT DESCRIPTION: 53 y.o. F with recent admission for PNA brought to ED with SOB.  In ED, hypoxic requiring BiPAP.  PCCM was consulted. 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 Inda Castle.   SIGNIFICANT EVENTS / STUDIES:  5/19 Palliative Care Consult: family and pt requesting comfort care. PCCM MD spoke with them and it was agrees that systemic steroids, abx, nebs, O2 would be continued. NPPV could be used @ pt's desire. PRN morphine ordered  LINES / TUBES:  CULTURES: reviewed  ANTIBIOTICS: Ceftriaxone 5/19 >>    SUBJECTIVE:  Very dyspneic off BiPAP  VITAL SIGNS: Temp:  [98 F (36.7 C)-98.6 F (37 C)] 98 F (36.7 C) (05/19 2017) Pulse Rate:  [99-127] 105 (05/19 2230) Resp:  [12-25] 16 (05/19 2230) BP: (95-141)/(65-93) 131/82 mmHg (05/19 2230) SpO2:  [91 %-100 %] 97 % (05/19 2230) FiO2 (%):  [40 %] 40 % (05/19 1100) Weight:  [76.4 kg (168 lb 6.9 oz)] 76.4 kg (168 lb 6.9 oz) (05/19 0414)  HEMODYNAMICS:    VENTILATOR SETTINGS: Vent Mode:  [-]  FiO2 (%):  [40 %] 40 %  INTAKE / OUTPUT: Intake/Output     05/19 0701 - 05/20 0700   I.V. (mL/kg) 870 (11.4)   IV Piggyback 362.5   Total Intake(mL/kg) 1232.5 (16.1)   Urine (mL/kg/hr) 800 (0.6)   Total Output 800   Net +432.5         PHYSICAL EXAMINATION: General: Acute resp distress, Cushingoid Neuro: No focal deficits, cognition intact HEENT: scleral edema, cushingoid facies Cardiovascular: RRR, no M/R/G.  Lungs: distant BS, diffuse [place] wheezes Abdomen: obese, BS x 4,  soft, NT/ND.  Musculoskeletal: multiple ecchymoses, symmetric edema    LABS: I have reviewed all of today's lab results. Relevant abnormalities are discussed in the A/P section  CXR: [place]  ASSESSMENT / PLAN:  PULMONARY A:   Acute on chronic Hypoxic Respiratory Failure AECOPD Emphysema Tobacco abuse P:   Cont systemic steroids Cont O2 Cont nebs Cont PRN NPPV as pt's discretion DNI  CARDIOVASCULAR A:  Hx of chronic diastolic CHF Hypotension P:  Monitor  RENAL A:   AKI Hypokalemia P:   Monitor BMET intermittently Monitor I/Os Correct electrolytes as indicated  GASTROINTESTINAL A:   NPO GERD P:   Diet as tolerated  HEMATOLOGIC A:   VTE Px P:  Enox  INFECTIOUS A:   Acute bronchitis P:   Micro and abx as above  ENDOCRINE  A:   DM Hypothyroidism P:   - SSI. - Synthroid 75mg  QD (1/2 home dose).  NEUROLOGIC A:   Acute Encephalopathy, resolved Hx of Depression P:   PRN morphine  Several discussions with pt and family re: goals of care. DNR/DNI appropriate. Cont med Rx of AECOPD. Cont PRN Morphine  CRITICAL CARE:  The patient is critically ill with multiple organ systems failure and requires high complexity decision making for assessment and support, frequent evaluation and titration of therapies, application of advanced monitoring technologies and extensive interpretation of multiple databases. Critical Care Time devoted  to patient care services described in this note is 50 minutes.    Merton Border, MD ; [facility] [phone].  After 5:30 PM or weekends, call [phone]

## 2014-01-11 DIAGNOSIS — Z515 Encounter for palliative care: Secondary | ICD-10-CM

## 2014-01-11 DIAGNOSIS — J9620 Acute and chronic respiratory failure, unspecified whether with hypoxia or hypercapnia: Secondary | ICD-10-CM

## 2014-01-11 LAB — BASIC METABOLIC PANEL
BUN: 31 mg/dL — AB (ref 6–23)
CO2: 27 mEq/L (ref 19–32)
Calcium: 8.7 mg/dL (ref 8.4–10.5)
Chloride: 104 mEq/L (ref 96–112)
Creatinine, Ser: 1.04 mg/dL (ref 0.50–1.10)
GFR calc Af Amer: 71 mL/min — ABNORMAL LOW (ref >90)
GFR, EST NON AFRICAN AMERICAN: 61 mL/min — AB (ref >90)
GLUCOSE: 158 mg/dL — AB (ref 70–99)
POTASSIUM: 3.8 meq/L (ref 3.7–5.3)
Sodium: 144 mEq/L (ref 137–147)

## 2014-01-11 LAB — URINE CULTURE
Colony Count: >=100000
SPECIAL REQUESTS: NORMAL

## 2014-01-11 LAB — CBC
HCT: 33.3 % — ABNORMAL LOW (ref 36.0–46.0)
HEMOGLOBIN: 10.6 g/dL — AB (ref 12.0–15.0)
MCH: 33.4 pg (ref 26.0–34.0)
MCHC: 31.8 g/dL (ref 30.0–36.0)
MCV: 105 fL — ABNORMAL HIGH (ref 78.0–100.0)
Platelets: 142 10*3/uL — ABNORMAL LOW (ref 150–400)
RBC: 3.17 MIL/uL — ABNORMAL LOW (ref 3.87–5.11)
RDW: 19 % — ABNORMAL HIGH (ref 11.5–15.5)
WBC: 9.4 10*3/uL (ref 4.0–10.5)

## 2014-01-11 LAB — GLUCOSE, CAPILLARY
GLUCOSE-CAPILLARY: 120 mg/dL — AB (ref 70–99)
GLUCOSE-CAPILLARY: 209 mg/dL — AB (ref 70–99)

## 2014-01-11 MED ORDER — PREDNISONE 50 MG PO TABS
60.0000 mg | ORAL_TABLET | Freq: Every day | ORAL | Status: DC
  Filled 2014-01-11: qty 1

## 2014-01-11 MED ORDER — MORPHINE SULFATE 2 MG/ML IJ SOLN
2.0000 mg | Freq: Once | INTRAMUSCULAR | Status: AC
  Administered 2014-01-11: 2 mg via INTRAVENOUS

## 2014-01-11 MED ORDER — LORAZEPAM 2 MG/ML IJ SOLN
INTRAMUSCULAR | Status: AC
  Filled 2014-01-11: qty 1

## 2014-01-11 MED ORDER — BUDESONIDE 0.25 MG/2ML IN SUSP
0.2500 mg | Freq: Four times a day (QID) | RESPIRATORY_TRACT | Status: DC
  Administered 2014-01-11 (×3): 0.25 mg via RESPIRATORY_TRACT
  Filled 2014-01-11 (×8): qty 2

## 2014-01-11 MED ORDER — LORAZEPAM 2 MG/ML IJ SOLN
0.5000 mg | INTRAMUSCULAR | Status: DC | PRN
  Administered 2014-01-12: 0.5 mg via INTRAVENOUS
  Administered 2014-01-12: 1 mg via INTRAVENOUS
  Filled 2014-01-11 (×2): qty 1

## 2014-01-11 MED ORDER — ALPRAZOLAM 0.5 MG PO TABS
0.5000 mg | ORAL_TABLET | Freq: Three times a day (TID) | ORAL | Status: DC
  Administered 2014-01-11 (×3): 0.5 mg via ORAL
  Filled 2014-01-11 (×3): qty 1

## 2014-01-11 MED ORDER — MORPHINE SULFATE ER 30 MG PO TBCR
30.0000 mg | EXTENDED_RELEASE_TABLET | Freq: Two times a day (BID) | ORAL | Status: DC
  Administered 2014-01-11 (×2): 30 mg via ORAL
  Filled 2014-01-11 (×2): qty 2

## 2014-01-11 MED ORDER — LORAZEPAM 2 MG/ML IJ SOLN
0.5000 mg | INTRAMUSCULAR | Status: DC | PRN
  Administered 2014-01-11: 1 mg via INTRAVENOUS

## 2014-01-11 MED ORDER — MORPHINE SULFATE 10 MG/ML IJ SOLN
1.0000 mg/h | INTRAVENOUS | Status: DC
  Administered 2014-01-11: 2 mg/h via INTRAVENOUS
  Administered 2014-01-11: 1 mg/h via INTRAVENOUS
  Filled 2014-01-11 (×3): qty 10

## 2014-01-11 NOTE — Progress Notes (Signed)
Progress Note from the Palliative Medicine Team at [place]:  -patient is alert and oriented, family gathered at bedside  -all are comfortable with plan for comfort, hopeful for disposition to residential hospice facility in [place], this is close to home facilitating family visitation.  Will Discuss with CMRN     Objective: Allergies  Allergen Reactions  . Tape Itching and Rash   Scheduled Meds: . ALPRAZolam  0.5 mg Oral TID  . antiseptic oral rinse  15 mL Mouth Rinse BID  . enoxaparin (LOVENOX) injection  40 mg Subcutaneous Q24H  . ipratropium-albuterol  3 mL Nebulization Q6H  . LORazepam      . morphine  30 mg Oral Q12H  . [START ON 01/12/2014] predniSONE  60 mg Oral Q breakfast   Continuous Infusions: . dextrose 5 % with kcl 50 mL/hr at 01/11/14 0700   PRN Meds:.sodium chloride, albuterol, LORazepam, morphine injection  BP 142/103  Pulse 114  Temp(Src) 97.5 F (36.4 C) (Oral)  Resp 21  Ht 5\' 2"  (1.575 m)  Wt 77.8 kg (171 lb 8.3 oz)  BMI 31.36 kg/m2  SpO2 92%   PPS:30 % at best     Intake/Output Summary (Last 24 hours) at 01/11/14 1021 Last data filed at 01/11/14 0500  Gross per 24 hour  Intake   1000 ml  Output    955 ml  Net     45 ml       Physical Exam:  General: alert and oriented, weak/dyspnic (discussed with nursing need for prn medication) HEENT:  Mm, no exudate Chest:   Diminished, audible wheeze CVS: tachycardic Abdomen:soft NT +BS Ext: without edema Neuro: oriented, able to communicate needs and wishes  Labs: CBC    Component Value Date/Time   WBC 9.4 01/11/2014 0230   RBC 3.17* 01/11/2014 0230   HGB 10.6* 01/11/2014 0230   HCT 33.3* 01/11/2014 0230   PLT 142* 01/11/2014 0230   MCV 105.0* 01/11/2014 0230   MCH 33.4 01/11/2014 0230   MCHC 31.8 01/11/2014 0230   RDW 19.0* 01/11/2014 0230   LYMPHSABS 1.1 01-29-2014 0930   MONOABS 1.2* January 29, 2014 0930   EOSABS 0.0 Jan 29, 2014 0930   BASOSABS 0.1 Jan 29, 2014 0930    BMET     Component Value Date/Time   NA 144 01/11/2014 0230   K 3.8 01/11/2014 0230   CL 104 01/11/2014 0230   CO2 27 01/11/2014 0230   GLUCOSE 158* 01/11/2014 0230   BUN 31* 01/11/2014 0230   CREATININE 1.04 01/11/2014 0230   CALCIUM 8.7 01/11/2014 0230   GFRNONAA 61* 01/11/2014 0230   GFRAA 71* 01/11/2014 0230    CMP     Component Value Date/Time   NA 144 01/11/2014 0230   K 3.8 01/11/2014 0230   CL 104 01/11/2014 0230   CO2 27 01/11/2014 0230   GLUCOSE 158* 01/11/2014 0230   BUN 31* 01/11/2014 0230   CREATININE 1.04 01/11/2014 0230   CALCIUM 8.7 01/11/2014 0230   PROT 6.1 01-29-2014 0930   ALBUMIN 2.7* January 29, 2014 0930   AST 44* 2014/01/29 0930   ALT 96* 2014-01-29 0930   ALKPHOS 157* 29-Jan-2014 0930   BILITOT 0.3 29-Jan-2014 0930   GFRNONAA 61* 01/11/2014 0230   GFRAA 71* 01/11/2014 0230      Assessment and Plan: 1. Code Status:DNR/DNI-comfort is main focus of care 2. Symptom Control:          Dyspnea: Morphine IV PRN-discussed with nursing importance of utilizing prn dosing, patient has not  has a dose since 0500      (continue to evaluate efficacy of prn dosing, consider continuous gtt within the context of the patient's [place]; patient and family wish for minimal sedation with  acceptable symptom managment) 3. Psycho/Social:Emotional support offered to all at bedside  4. Disposition:  Hopeful for in patient hospice facility.     Time In Time Out Total Time Spent with Patient Total Overall Time  0820 0855 35 min 35 min    Greater than 50%  of this time was spent counseling and coordinating care related to the above assessment and plan.  Wadie Lessen NP  Palliative Medicine Team Team Phone # [phone] Pager [phone]  Discussed with Dr Alva Garnet 1

## 2014-01-11 NOTE — Progress Notes (Signed)
PULMONARY / CRITICAL CARE MEDICINE  Name: [name] MRN: [id number] DOB: 1961/02/22    ADMISSION DATE:  Jan 10, 2014 CONSULTATION DATE:    REFERRING MD :  EDP PRIMARY SERVICE:  PCCM  CHIEF COMPLAINT:  SOB  BRIEF PATIENT DESCRIPTION: 53 y.o. F with recent admission for PNA brought to ED with SOB.  In ED, hypoxic requiring BiPAP.  PCCM was consulted. 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 Inda Castle.   SIGNIFICANT EVENTS / STUDIES:  5/19 Palliative Care Consult: family and pt requesting comfort care. PCCM MD spoke with them and it was agrees that systemic steroids, abx, nebs, O2 would be continued. NPPV could be used @ pt's desire. PRN morphine ordered 5/20 Intractable dyspnea despite PRN morphine. Family and pt desiring full comfort care. Morphine infusion initiated  LINES / TUBES:  CULTURES: reviewed  ANTIBIOTICS: Vanc 5/18 >> 5/19 Zosyn 5/18 >> 5/19 Ceftriaxone 5/19 >> 5/20   SUBJECTIVE:  Appears worn out. Dyspneic. Tearful @ times. Extended family @ bedside. Pt expresses sometimes conflicting desires - wants comfort but states that she doesn't want to "leave her husband" yet  VITAL SIGNS: Temp:  [97.5 F (36.4 C)-98.3 F (36.8 C)] 97.5 F (36.4 C) (05/20 0340) Pulse Rate:  [97-127] 114 (05/20 0900) Resp:  [12-25] 21 (05/20 0900) BP: (118-150)/(73-103) 142/103 mmHg (05/20 0900) SpO2:  [91 %-100 %] 92 % (05/20 0900) Weight:  [77.8 kg (171 lb 8.3 oz)] 77.8 kg (171 lb 8.3 oz) (05/20 0500)  INTAKE / OUTPUT: Intake/Output     05/19 0701 - 05/20 0700 05/20 0701 - 05/21 0700   I.V. (mL/kg) 1170 (15)    IV Piggyback 362.5    Total Intake(mL/kg) 1532.5 (19.7)    Urine (mL/kg/hr) 955 (0.5)    Total Output 955     Net +577.5             PHYSICAL EXAMINATION: General: dyspneic, diaphoretic, Cushingoid Neuro: No focal deficits, cognition intact HEENT: scleral edema, cushingoid facies Cardiovascular: RRR, no M/R/G.  Lungs: distant BS, diffuse [place] wheezes Abdomen: obese, BS x 4, soft, NT/ND.  Musculoskeletal: multiple ecchymoses, symmetric edema    LABS: I have reviewed all of today's lab results. Relevant abnormalities are discussed in the A/P section  CXR: NNF  ASSESSMENT: Acute on chronic Hypoxic Respiratory Failure Intractable dyspnea AECOPD with purulent bronchitis Severe emphysema Continued smoking chronic diastolic CHF Hypotension, resolved AKI, mild - now resolved Hypokalemia, resolved GERD Anemia, NOS Very mild thrombocytopenia DM 2 Hypothyroidism Acute Encephalopathy, resolved Depression   Pt and family strongly desire that our focus be entirely on her comfort. She is clearly unacceptably uncomfortable.  Cont nebulized BDs and O2 as part of comfort care Morphine infusion initiated Cont PRN morphine and lorazepam DC BiPAP from room If it appears that this will be prolonged, we can consider transfer to residential Hospice as long as she is acceptably comfortable   Merton Border, MD ; [facility] service Mobile [phone].  After 5:30 PM or weekends, call [phone]

## 2014-01-11 NOTE — Progress Notes (Signed)
eLink Physician-Brief Progress Note Patient Name: [name] DOB: Oct 17, 1960 MRN: [id number]  Date of Service  01/11/2014   HPI/Events of Note   Comfort measures only Comfortable on morphine gtt Family requests to move out of ICU  eICU Interventions  Move to med surg bed   Intervention Category Major Interventions: End of life / care limitation discussion  Juanito Doom 01/11/2014, 8:27 PM

## 2014-01-11 NOTE — Consult Note (Signed)
I have reviewed this case with our NP and agree with the Assessment and Plan as stated.  Pansie Guggisberg L. Hartley Wyke, MD MBA The Palliative Medicine Team at [place] Team Phone: [phone] Pager: [phone]   

## 2014-01-11 NOTE — Clinical Social Work Note (Addendum)
[facility] [place] [facility]  [facility] [place] will be in contact with family for a possible admission today.  9:19am- CSW made referral to [facility] per pt/family/Palliative request.  Pt is currently under review for possible admission.  Pt has also been referred to [place] at [place].  Nonnie Done, [place] [phone]  Clinical Social Work

## 2014-01-12 DIAGNOSIS — J449 Chronic obstructive pulmonary disease, unspecified: Secondary | ICD-10-CM

## 2014-01-12 MED ORDER — BUDESONIDE 0.25 MG/2ML IN SUSP
0.2500 mg | Freq: Four times a day (QID) | RESPIRATORY_TRACT | Status: DC
  Administered 2014-01-12: 0.25 mg via RESPIRATORY_TRACT
  Filled 2014-01-12 (×6): qty 2

## 2014-01-12 MED ORDER — BUDESONIDE 0.25 MG/2ML IN SUSP: 0.2500 mg | Freq: Four times a day (QID) | RESPIRATORY_TRACT | Status: DC

## 2014-01-12 NOTE — Clinical Social Work Note (Signed)
CSW received a call from [facility]  Hospice RN at bedside with family.  Hospice RN states physician reports hospital death likely.  [facility] services no longer requested.  If disposition/expectancy has changed, may re-consult [facility]. Ph: [phone] Fax: [place], [place] [phone]  Clinical Social Work

## 2014-01-12 NOTE — Progress Notes (Signed)
Progress Note from the Palliative Medicine Team at [place]:  -patient is minimally responsive, family at bedside  -family is hopeful for comfort and dignity-hopeful for dignified death   Objective: Allergies  Allergen Reactions  . Tape Itching and Rash   Scheduled Meds: . antiseptic oral rinse  15 mL Mouth Rinse BID  . budesonide  0.25 mg Nebulization Q6H  . enoxaparin (LOVENOX) injection  40 mg Subcutaneous Q24H  . ipratropium-albuterol  3 mL Nebulization Q6H   Continuous Infusions: . dextrose 5 % with kcl 20 mL/hr at 01/12/14 1226  . morphine 2 mg/hr (01/11/14 1621)   PRN Meds:.sodium chloride, albuterol, LORazepam, morphine injection  BP 120/83  Pulse 104  Temp(Src) 97.6 F (36.4 C) (Oral)  Resp 19  Ht 5\' 1"  (1.549 m)  Wt 78.4 kg (172 lb 13.5 oz)  BMI 32.67 kg/m2  SpO2 89%   PPS: 20 % at best    Intake/Output Summary (Last 24 hours) at 01/12/14 1540 Last data filed at 01/11/14 2100  Gross per 24 hour  Intake 601.65 ml  Output    600 ml  Net   1.65 ml        Physical Exam:  General: minimally responsive, NAD  HEENT: Mm, no exudate  Chest: Diminished, audible wheeze  CVS: tachycardic  Abdomen:soft NT +BS  Ext: without edema  Neuro: oriented, able to communicate needs and wishes  Labs: CBC    Component Value Date/Time   WBC 9.4 01/11/2014 0230   RBC 3.17* 01/11/2014 0230   HGB 10.6* 01/11/2014 0230   HCT 33.3* 01/11/2014 0230   PLT 142* 01/11/2014 0230   MCV 105.0* 01/11/2014 0230   MCH 33.4 01/11/2014 0230   MCHC 31.8 01/11/2014 0230   RDW 19.0* 01/11/2014 0230   LYMPHSABS 1.1 Jan 11, 2014 0930   MONOABS 1.2* 01-11-2014 0930   EOSABS 0.0 Jan 11, 2014 0930   BASOSABS 0.1 2014-01-11 0930    BMET    Component Value Date/Time   NA 144 01/11/2014 0230   K 3.8 01/11/2014 0230   CL 104 01/11/2014 0230   CO2 27 01/11/2014 0230   GLUCOSE 158* 01/11/2014 0230   BUN 31* 01/11/2014 0230   CREATININE 1.04 01/11/2014 0230   CALCIUM 8.7 01/11/2014 0230    GFRNONAA 61* 01/11/2014 0230   GFRAA 71* 01/11/2014 0230    CMP     Component Value Date/Time   NA 144 01/11/2014 0230   K 3.8 01/11/2014 0230   CL 104 01/11/2014 0230   CO2 27 01/11/2014 0230   GLUCOSE 158* 01/11/2014 0230   BUN 31* 01/11/2014 0230   CREATININE 1.04 01/11/2014 0230   CALCIUM 8.7 01/11/2014 0230   PROT 6.1 01/11/14 0930   ALBUMIN 2.7* 01-11-2014 0930   AST 44* 01/11/2014 0930   ALT 96* Jan 11, 2014 0930   ALKPHOS 157* 01-11-14 0930   BILITOT 0.3 01/11/2014 0930   GFRNONAA 61* 01/11/2014 0230   GFRAA 71* 01/11/2014 0230     Assessment and Plan: 1. Code Status   DNR/DNI 2. Symptom Control:  Dyspnea: Morphine IV continuous gtt  Agitation: Ativan 1 mg IV every 4 hrs prn 3. Psycho/Social:  Emotional support offered to family at bedside,all understand the over all poor prognosis 4. Disposition:  Expect hospital death  Greater than 50%  of this time was spent counseling and coordinating care related to the above assessment and plan.  Wadie Lessen NP  Palliative Medicine Team Team Phone # [phone] Pager [phone]   1

## 2014-01-12 NOTE — Progress Notes (Signed)
PULMONARY / CRITICAL CARE MEDICINE  Name: [name] MRN: [id number] DOB: Aug 29, 1960    ADMISSION DATE:  01/22/2014 CONSULTATION DATE:    REFERRING MD :  EDP PRIMARY SERVICE:  PCCM  CHIEF COMPLAINT:  SOB  BRIEF PATIENT DESCRIPTION: 53 y.o. F with recent admission for PNA brought to ED with SOB.  In ED, hypoxic requiring BiPAP.  PCCM was consulted. 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 Inda Castle.   SIGNIFICANT EVENTS / STUDIES:  5/19 Palliative Care Consult: family and pt requesting comfort care. PCCM MD spoke with them and it was agrees that systemic steroids, abx, nebs, O2 would be continued. NPPV could be used @ pt's desire. PRN morphine ordered 5/20 Intractable dyspnea despite PRN morphine. Family and pt desiring full comfort care. Morphine infusion initiated  LINES / TUBES:  CULTURES: reviewed  ANTIBIOTICS: Vanc 5/18 >> 5/19 Zosyn 5/18 >> 5/19 Ceftriaxone 5/19 >> 5/20   SUBJECTIVE:  Appears comfortable on morphine drip at 2 mg per hour  VITAL SIGNS: Temp:  [97.6 F (36.4 C)-97.9 F (36.6 C)] 97.6 F (36.4 C) (05/21 0344) Pulse Rate:  [94-106] 104 (05/21 0344) Resp:  [14-20] 19 (05/21 0344) BP: (120-130)/(83-84) 120/83 mmHg (05/21 0344) SpO2:  [89 %-100 %] 89 % (05/21 1040) Weight:  [172 lb 13.5 oz (78.4 kg)] 172 lb 13.5 oz (78.4 kg) (05/20 2220)  INTAKE / OUTPUT: Intake/Output     05/20 0701 - 05/21 0700 05/21 0701 - 05/22 0700   P.O. 600    I.V. (mL/kg) 713.4 (9.1)    IV Piggyback     Total Intake(mL/kg) 1313.4 (16.8)    Urine (mL/kg/hr) 600 (0.3)    Total Output 600     Net +713.4            PHYSICAL EXAMINATION: General: Chronically ill, Cushingoid Neuro: Sedated on morphine HEENT: scleral edema, cushingoid  facies Cardiovascular: RRR, no M/R/G.  Lungs: distant BS, diffuse [place] wheezes Abdomen: obese, BS x 4, soft, NT/ND.  Musculoskeletal: multiple ecchymoses, symmetric edema    LABS: I have reviewed all of today's lab results. Relevant abnormalities are discussed in the A/P section  CXR: NNF  ASSESSMENT: Acute on chronic Hypoxic Respiratory Failure Intractable dyspnea AECOPD with purulent bronchitis Severe emphysema Continued smoking chronic diastolic CHF Hypotension, resolved AKI, mild - now resolved Hypokalemia, resolved GERD Anemia, NOS Very mild thrombocytopenia DM 2 Hypothyroidism Acute Encephalopathy, resolved Depression  .  Cont nebulized BDs and O2 as part of comfort care Morphine infusion initiated Cont PRN morphine and lorazepam DC BiPAP from room Family appears to be coping If it appears that this will be prolonged, we can consider transfer to residential Hospice as long as she is acceptably comfortable   Kara Mead MD. FCCP. [place] [facility] Pager [phone] If no response call [phone]

## 2014-01-15 LAB — CULTURE, BLOOD (ROUTINE X 2)
CULTURE: NO GROWTH
Culture: NO GROWTH

## 2014-01-23 NOTE — Progress Notes (Signed)
Called to room by son, pt noted no respiration,no pulse,skin cold and mottled. Pt pronounced dead. 2 RN`s verified no heartbeat per auscultation and no carotid pulse palpated. MD notified, family at bedside. [place] donor notified.

## 2014-01-23 DEATH — deceased

## 2014-01-31 ENCOUNTER — Inpatient Hospital Stay: Payer: Medicare Other | Admitting: Physical Medicine & Rehabilitation

## 2014-02-02 NOTE — Discharge Summary (Signed)
DEATH SUMMARY  DATE OF ADMISSION:  2014/02/05  DATE OF DISCHARGE/DEATH:  February 08, 2014  ADMISSION DIAGNOSES:   Acute on chronic respiratory failure Acute exacerbation of COPD End stage COPD/emphysema Continued smoking Diastolic heart failure Hypotension Acute kidney injury Hypokalemia GERD Pulmonary infiltrate Acute encephalopathy Depression   DISCHARGE DIAGNOSES:   Acute on chronic Hypoxic Respiratory Failure  Intractable dyspnea  AECOPD with purulent bronchitis  Severe emphysema  Continued smoking  chronic diastolic CHF  Hypotension, resolved  AKI, mild - now resolved  Hypokalemia, resolved  GERD  Anemia, NOS  Mild thrombocytopenia  DM 2  Hypothyroidism  Acute Encephalopathy, resolved  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  Mrs. [name] is a 53 y.o. F with multiple co-morbidities including recent admission for hypoxic resp failure / COPD exacerbation and just d/c'd from [facility] on 5/1 and transferred to [facility] and was then d/c'd home on 5/12. She presents again via EMS February 06, 2023 for SOB. Per ED notes, pt was brought in from home due to difficulty breathing that began morning of presentation. She also reported feeling tight in the chest along with increased swelling of the legs. In ED, she was put on [place]; however, became more tachypneic requiring BiPAP. PCCM was consulted for admission. During my exam, she is very sleepy but will respond to verbal and painful stimuli. 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 Inda Castle, persistent paraplegia    HOSPITAL COURSE:   02/06/2023 Admitted to ICU with above history and diagnoses.Treated in usual manner for AECOPD including NPPV. 02/07/23 Palliative Care  Consult: family and pt requesting comfort care. PCCM MD spoke with them and it was agrees that systemic steroids, abx, nebs, O2 would be continued. NPPV could be used @ pt's desire. PRN morphine ordered  Feb 08, 2023 Intractable dyspnea despite PRN morphine. Family and pt desiring full comfort care. Morphine infusion initiated. Transferred to med-surg floor 02/09/2023 Passed away peacefully with family @ bedside  ANTIBIOTICS:  Vanc 02/06/2023 >> Feb 07, 2023  Zosyn 02-06-2023 >> 07-Feb-2023  Ceftriaxone 02-07-2023 >> Feb 08, 2023   Cause of death:  Acute on chronic respiratory failure due to AECOPD End stage emphysema  Autopsy:  No  Smoking:  Yes   Merton Border, MD;  PCCM service; Mobile [phone]

## 2014-05-27 IMAGING — CR DG CHEST 2V SAME DAY
1 series · 1 of 1 positions shown · non-contrast
Comparison: 11/16/2013

CLINICAL DATA: Left-sided chest pain.  Wheezing.

EXAM:
CHEST - 2 VIEW SAME DAY

[w chest lat]
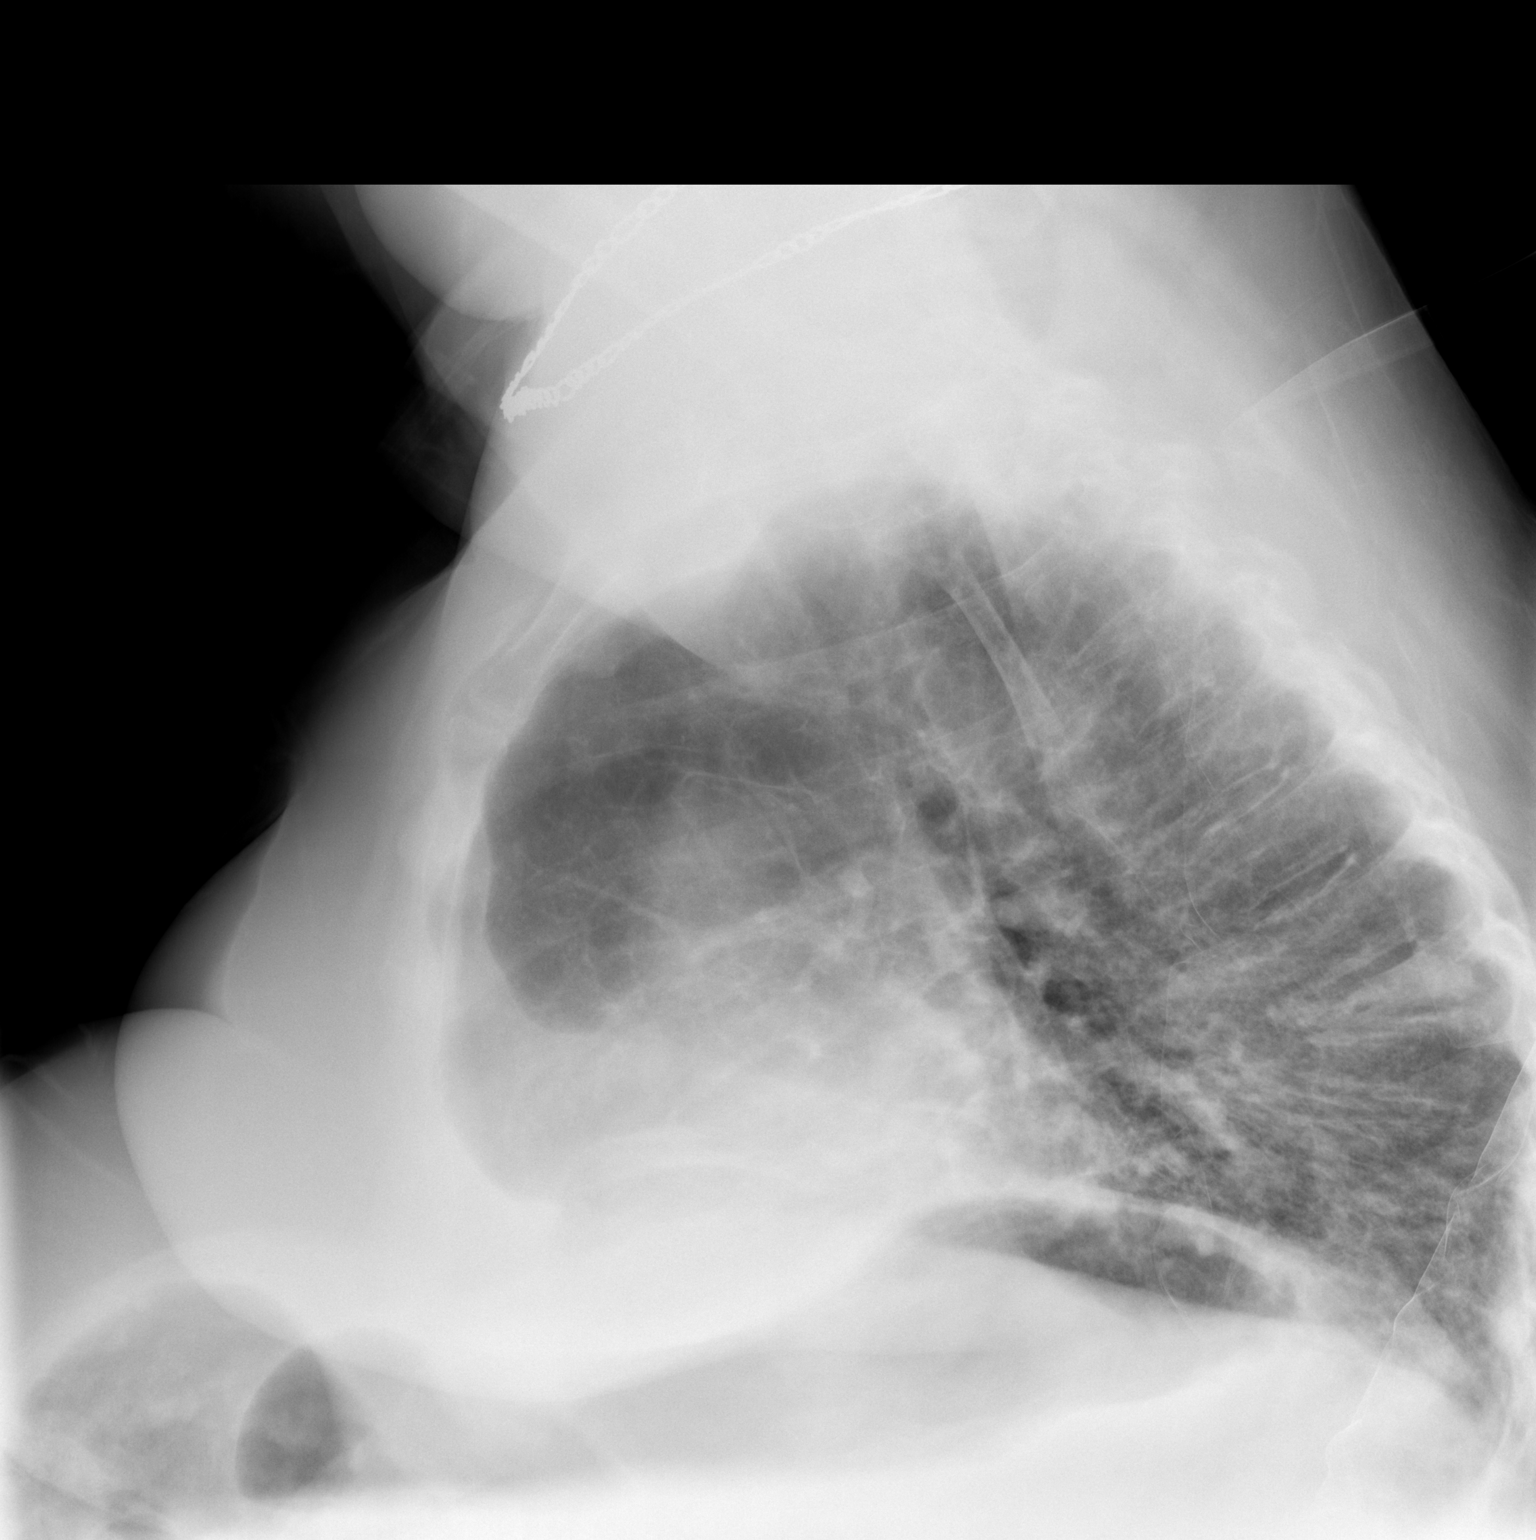

[1 of 1 positions shown; findings below may reference images not displayed]

FINDINGS: There are coarse reticular opacities at the lung bases that are
without change. This is likely combination scarring atelectasis.
Infiltrate is not excluded.

No new lung opacities.  No pleural effusion or pneumothorax.

Cardiac silhouette is normal in size. Normal mediastinal and hilar
contours.

There are compression fractures of the thoracic spine, stable from
the most recent prior exam. Bony thorax is diffusely demineralized.
IMPRESSION: 1. No change from the most recent prior chest with that.
2. Lung base opacity is may be due to a combination of atelectasis
and scarring. Infiltrate is possible. No pulmonary edema.

## 2014-05-31 IMAGING — CR DG ABD PORTABLE 1V
1 series · 1 of 1 positions shown · non-contrast
Comparison: Abdominal radiograph performed earlier today at [DATE]
a.m.

CLINICAL DATA: Nasogastric tube placement.

EXAM:
PORTABLE ABDOMEN - 1 VIEW

[AP]
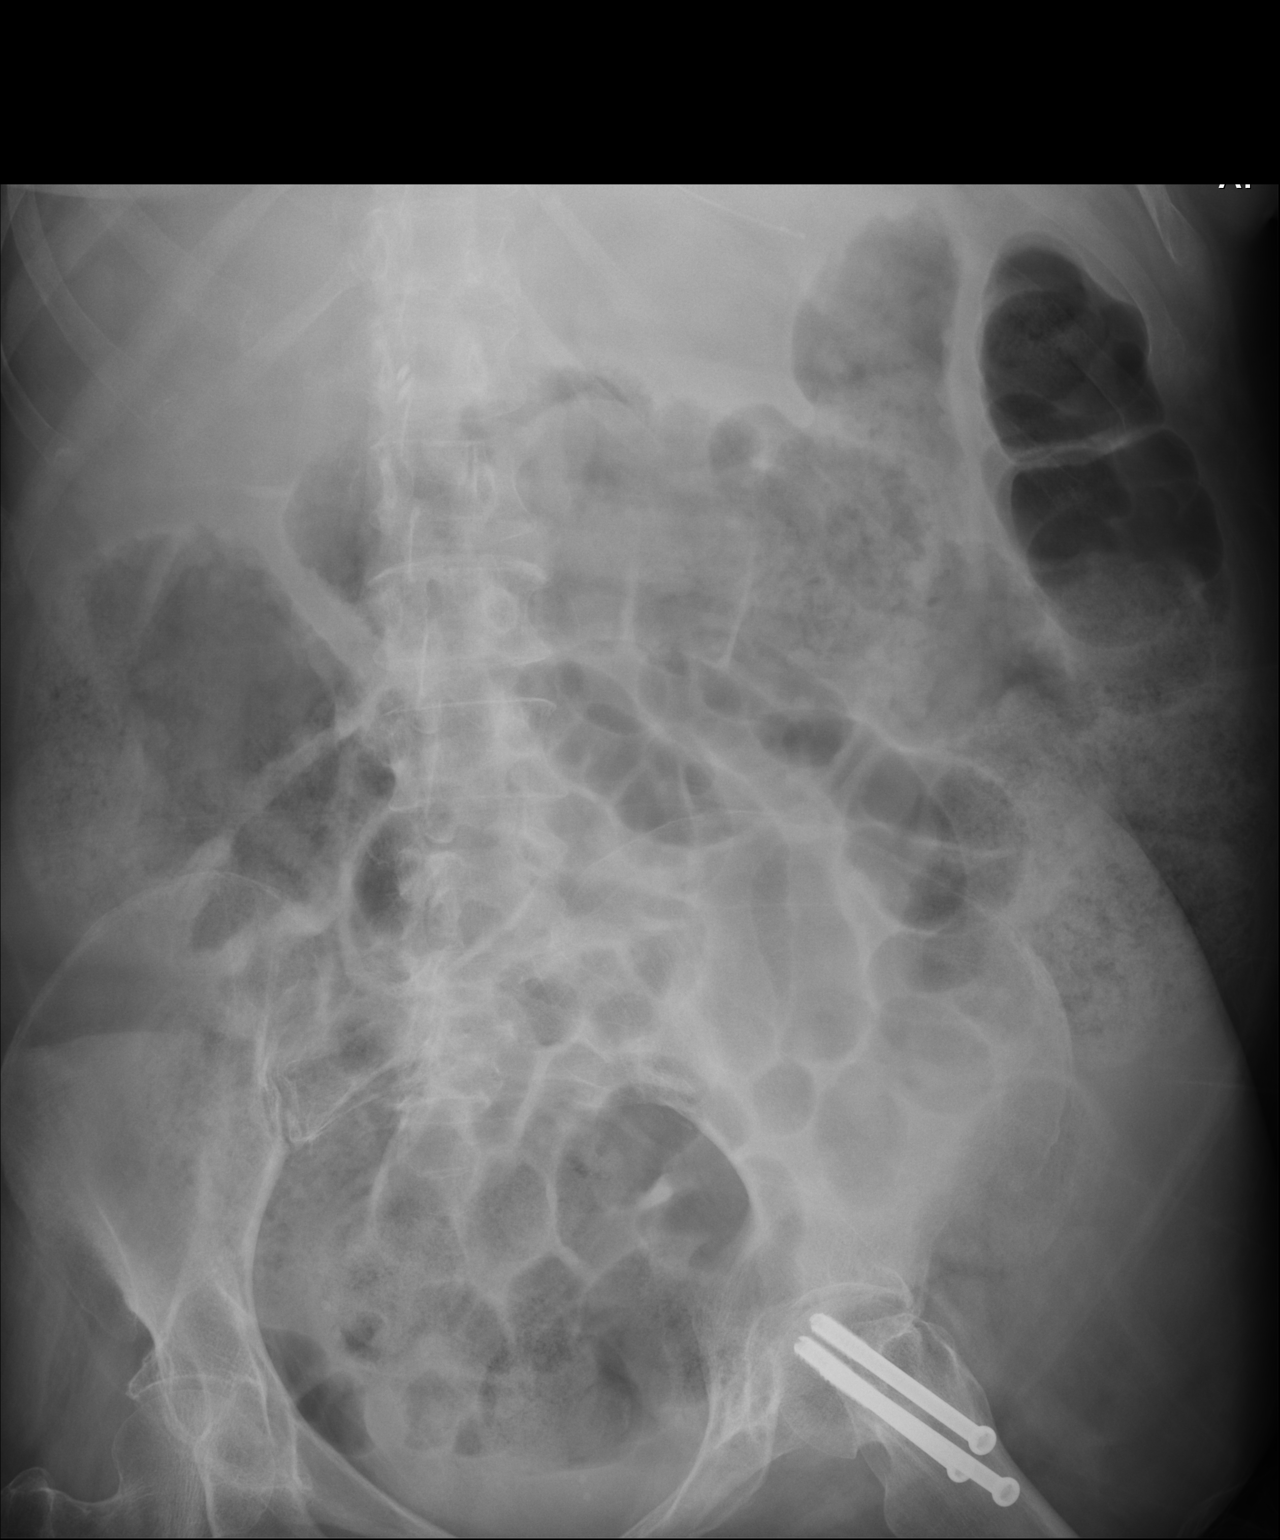

[1 of 1 positions shown; findings below may reference images not displayed]

FINDINGS: The patient's enteric tube is seen ending at the body of the
stomach. Its sideport is noted at the distal esophagus. This could
be advanced another 5 cm, as deemed clinically appropriate.

The visualized bowel gas pattern is grossly unremarkable. Scattered
air-filled loops of small bowel are seen. The colon is largely
filled with stool. No free intra-abdominal air is identified, though
evaluation for free air is limited on a single supine view.

The lung bases are not well assessed. Left basilar airspace opacity
may reflect atelectasis. Postoperative change is noted at the left
femoral head.
IMPRESSION: Enteric tube seen ending at the body of the stomach, with its
sideport in the distal esophagus. This could be advanced another 5
cm, as deemed clinically appropriate.

## 2014-06-26 ENCOUNTER — Encounter (HOSPITAL_COMMUNITY): Payer: Self-pay | Admitting: General Practice

## 2014-06-29 IMAGING — CR DG ABD PORTABLE 1V
1 series · 1 of 1 positions shown · non-contrast
Comparison: CT abdomen pelvis dated 12/21/2013

CLINICAL DATA: Lower abdominal pain, constipation

EXAM:
PORTABLE ABDOMEN - 1 VIEW

[AP]
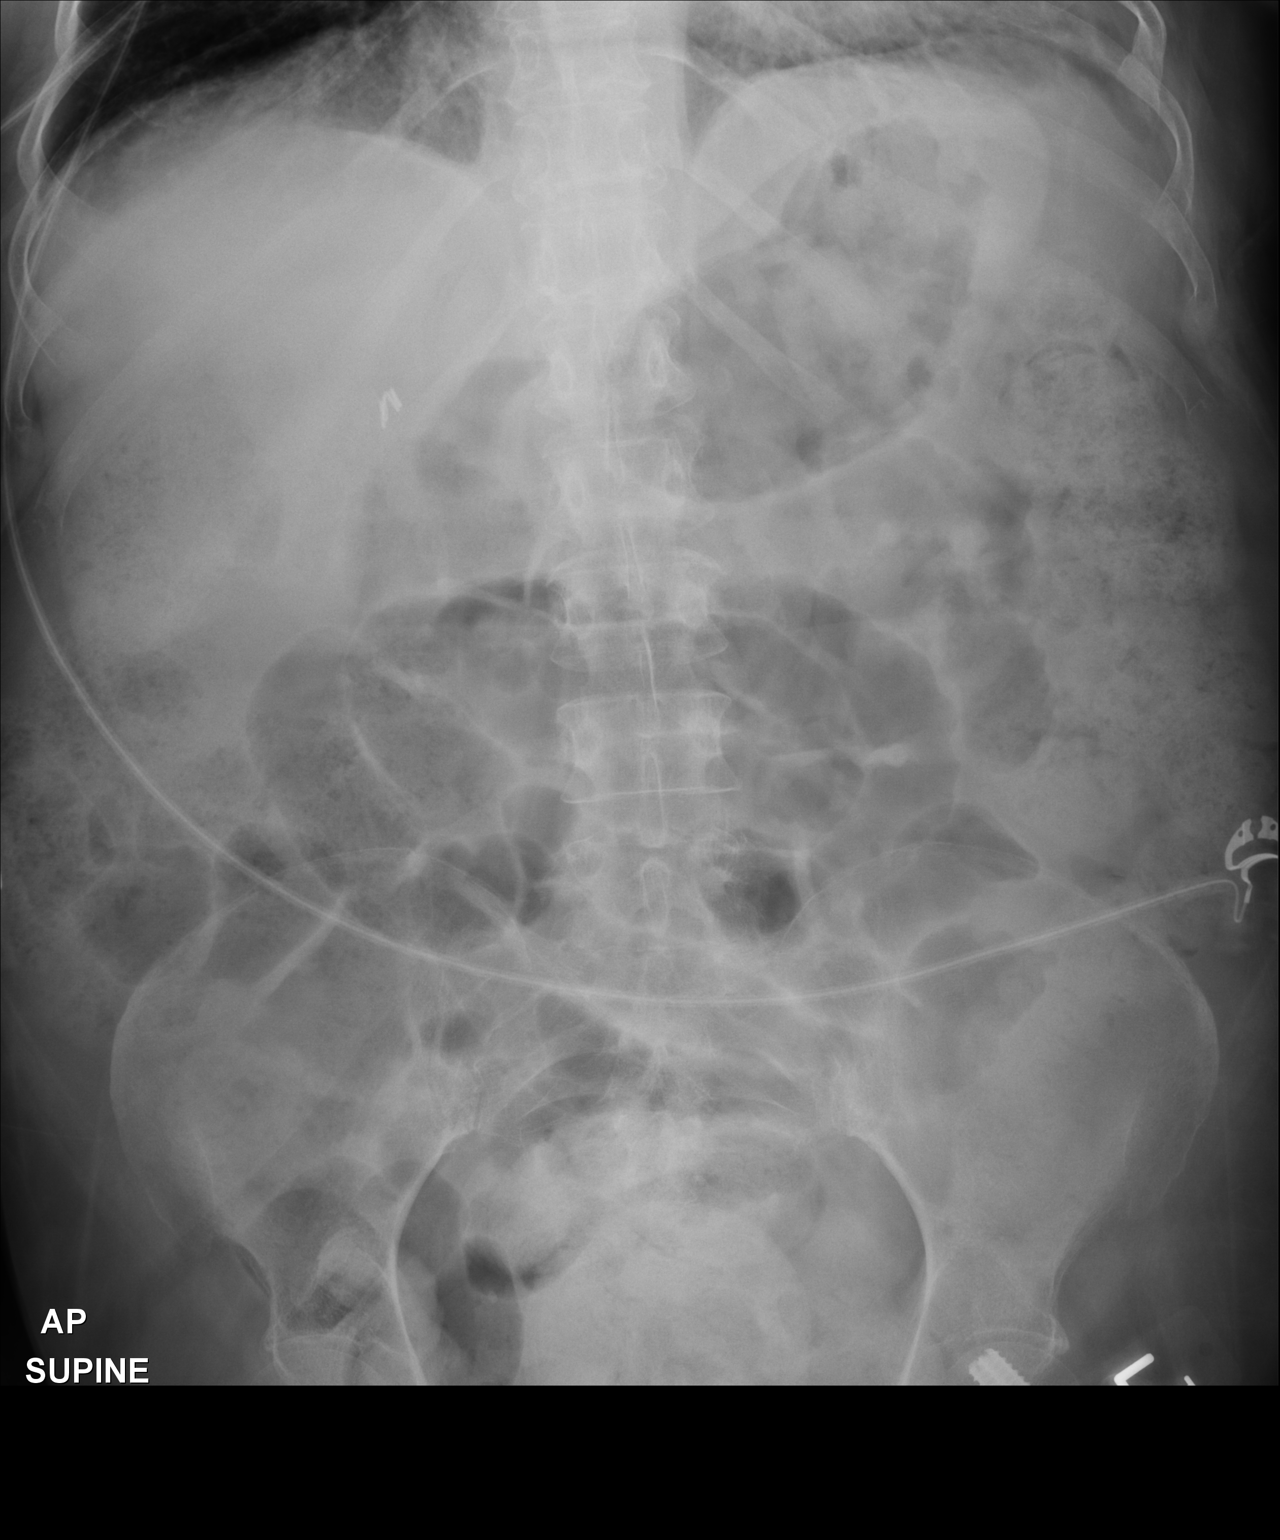

[1 of 1 positions shown; findings below may reference images not displayed]

FINDINGS: Nonobstructive bowel gas pattern.

Moderate colonic stool burden, particularly in the left
colon/rectum.

Cholecystectomy clips.

Status post ORIF of the left hip.
IMPRESSION: Moderate colonic stool burden, particularly in the left
colon/rectum, suggesting constipation.

## 2014-06-30 IMAGING — CR DG CHEST 1V PORT
1 series · 1 of 1 positions shown · non-contrast
Comparison: 12/21/2013

CLINICAL DATA: PICC placement

EXAM:
PORTABLE CHEST - 1 VIEW

[AP]
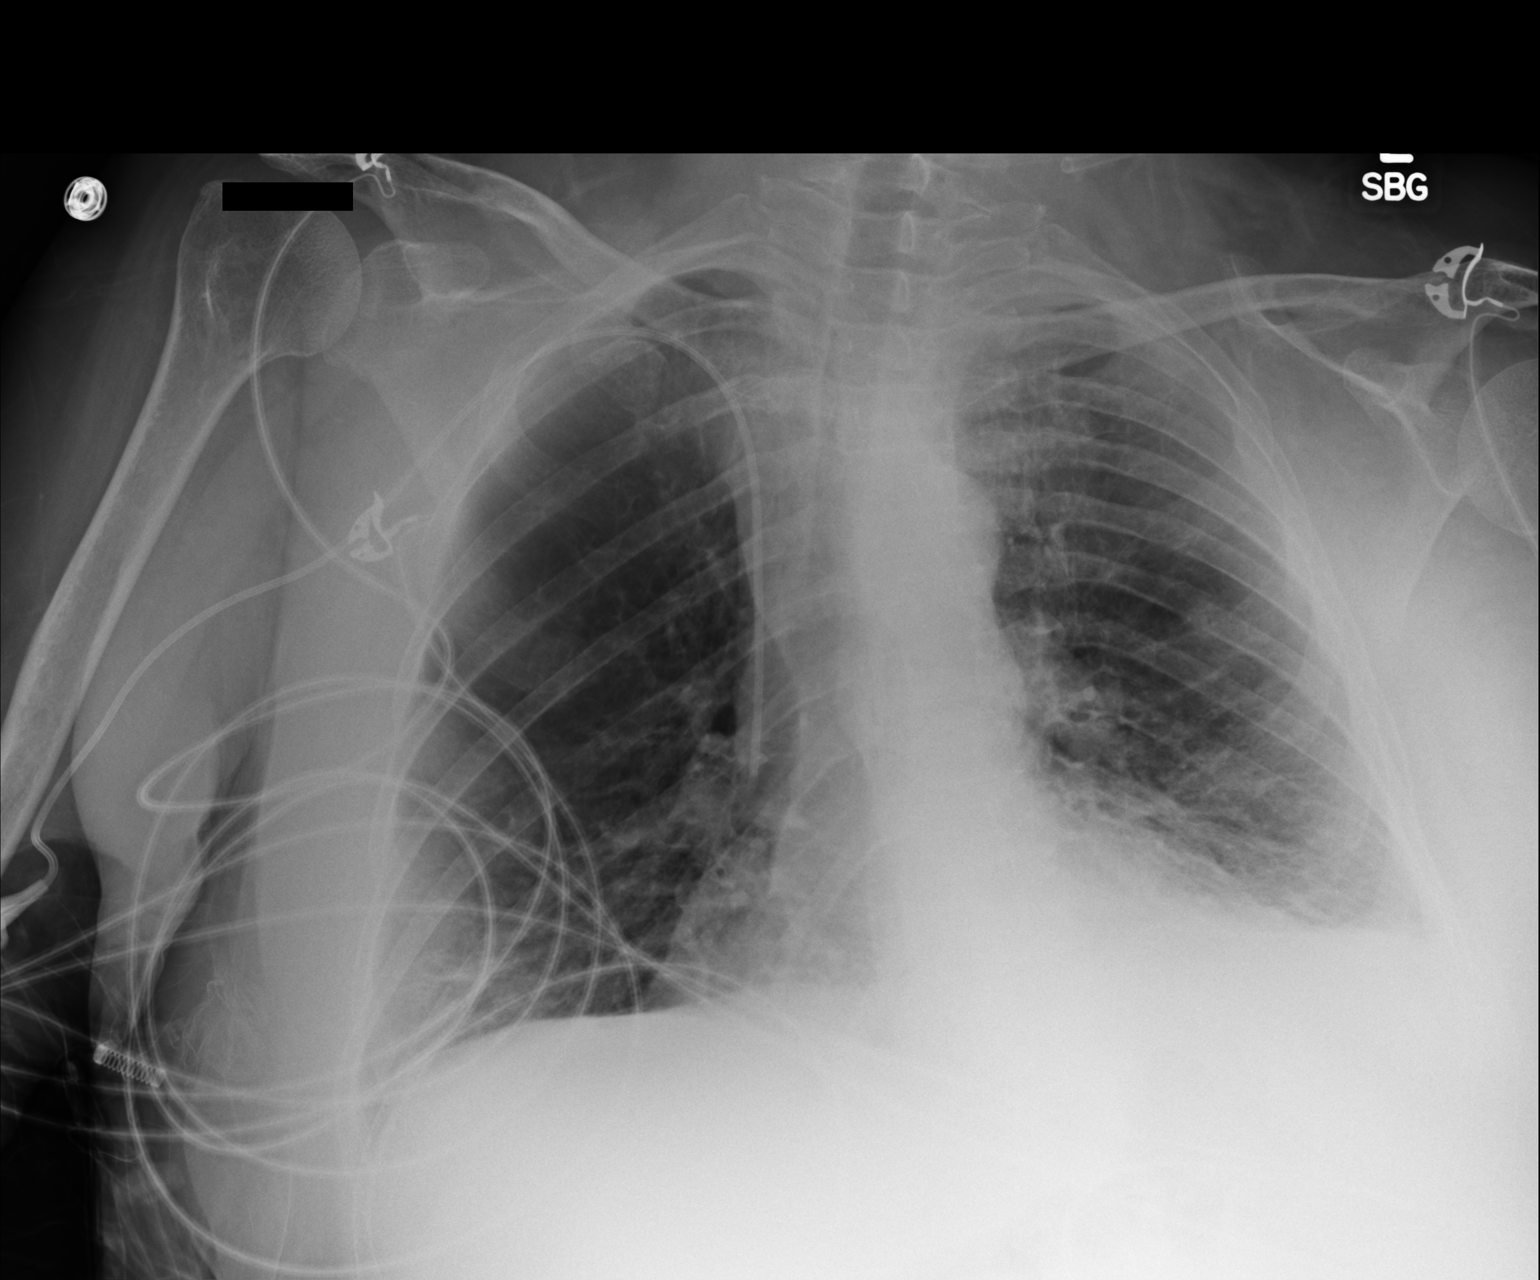

[1 of 1 positions shown; findings below may reference images not displayed]

FINDINGS: Right PICC has its tip in the lower superior vena cava.

Left lung base opacity has improved from the prior study. No new
lung opacities. Lungs remain hyperexpanded.

Cardiac silhouette is normal in size. Normal mediastinal and hilar
contours.
IMPRESSION: 1. Right PICC tip is in the lower superior vena cava, well
positioned.
2. Improved left lung base opacity, likely improved pneumonia.

## 2014-08-03 ENCOUNTER — Encounter (HOSPITAL_COMMUNITY): Payer: Self-pay | Admitting: Cardiology

## 2014-09-07 ENCOUNTER — Encounter (HOSPITAL_COMMUNITY): Payer: Self-pay | Admitting: Neurological Surgery
